# Patient Record
Sex: Male | Born: 1980 | Race: White | Hispanic: No | Marital: Married | Smoking: Current every day smoker
Health system: Southern US, Community
[De-identification: ages and names within clinical notes are randomized; demographics above are authoritative.]

## PROBLEM LIST (undated history)

## (undated) DIAGNOSIS — I10 Essential (primary) hypertension: Secondary | ICD-10-CM

## (undated) DIAGNOSIS — F32A Depression, unspecified: Secondary | ICD-10-CM

## (undated) DIAGNOSIS — R188 Other ascites: Secondary | ICD-10-CM

## (undated) DIAGNOSIS — M549 Dorsalgia, unspecified: Principal | ICD-10-CM

## (undated) DIAGNOSIS — K7290 Hepatic failure, unspecified without coma: Secondary | ICD-10-CM

## (undated) DIAGNOSIS — F1010 Alcohol abuse, uncomplicated: Secondary | ICD-10-CM

## (undated) DIAGNOSIS — K7030 Alcoholic cirrhosis of liver without ascites: Secondary | ICD-10-CM

## (undated) DIAGNOSIS — F419 Anxiety disorder, unspecified: Secondary | ICD-10-CM

## (undated) DIAGNOSIS — J449 Chronic obstructive pulmonary disease, unspecified: Secondary | ICD-10-CM

## (undated) DIAGNOSIS — G8929 Other chronic pain: Secondary | ICD-10-CM

## (undated) DIAGNOSIS — K7682 Hepatic encephalopathy: Secondary | ICD-10-CM

## (undated) DIAGNOSIS — I8500 Esophageal varices without bleeding: Secondary | ICD-10-CM

## (undated) DIAGNOSIS — N529 Male erectile dysfunction, unspecified: Secondary | ICD-10-CM

## (undated) HISTORY — PX: GANGLION CYST EXCISION: SHX1691

## (undated) HISTORY — DX: Other ascites: R18.8

## (undated) HISTORY — DX: Alcoholic cirrhosis of liver without ascites: K70.30

## (undated) HISTORY — DX: Alcohol abuse, uncomplicated: F10.10

## (undated) HISTORY — DX: Esophageal varices without bleeding: I85.00

## (undated) HISTORY — DX: Other chronic pain: G89.29

## (undated) HISTORY — PX: HAND SURGERY: SHX662

## (undated) HISTORY — DX: Anxiety disorder, unspecified: F41.9

## (undated) HISTORY — PX: WRIST SURGERY: SHX841

## (undated) HISTORY — DX: Dorsalgia, unspecified: M54.9

## (undated) HISTORY — DX: Depression, unspecified: F32.A

## (undated) HISTORY — DX: Hepatic encephalopathy: K76.82

## (undated) HISTORY — DX: Male erectile dysfunction, unspecified: N52.9

## (undated) HISTORY — DX: Essential (primary) hypertension: I10

## (undated) HISTORY — DX: Chronic obstructive pulmonary disease, unspecified: J44.9

## (undated) HISTORY — DX: Hepatic failure, unspecified without coma: K72.90

## (undated) NOTE — *Deleted (*Deleted)
[facility] - Clinic Note  11/30/2019     CHIEF COMPLAINT Patient presents for No chief complaint on file.   HISTORY OF PRESENT ILLNESS: Zachary Mata is a 75 y.o. male who presents to the clinic today for:     Referring physician: Monica Becton, MD [phone] [place] [address] [place],  [place] [address]  HISTORICAL INFORMATION:   Selected notes from the MEDICAL RECORD NUMBER    CURRENT MEDICATIONS: Current Outpatient Medications (Ophthalmic Drugs)  Medication Sig  . azelastine (OPTIVAR) 0.05 % ophthalmic solution Place 2 drops into both eyes 2 (two) times daily. (Patient not taking: Reported on 11/19/2019)   No current facility-administered medications for this visit. (Ophthalmic Drugs)   Current Outpatient Medications (Other)  Medication Sig  . busPIRone (BUSPAR) 15 MG tablet 1 tablet p.o. twice daily to 3 times daily (Patient not taking: Reported on 11/19/2019)  . cetirizine (ZYRTEC) 10 MG chewable tablet Chew 10 mg by mouth daily.  . fluticasone (FLONASE) 50 MCG/ACT nasal spray One spray in each nostril twice a day (Patient not taking: Reported on 11/19/2019)  . lisinopril (ZESTRIL) 20 MG tablet Take 1 tablet (20 mg total) by mouth daily. (Patient not taking: Reported on 11/19/2019)  . tadalafil (CIALIS) 5 MG tablet Take 1 tablet (5 mg total) by mouth daily as needed for erectile dysfunction. (Patient not taking: Reported on 11/19/2019)  . umeclidinium-vilanterol (ANORO ELLIPTA) 62.5-25 MCG/INH AEPB Inhale 1 puff into the lungs daily. (Patient not taking: Reported on 11/19/2019)  . Vitamin D, Ergocalciferol, (DRISDOL) 1.25 MG (50000 UNIT) CAPS capsule Take 1 capsule (50,000 Units total) by mouth every 7 (seven) days. Take for 8 total doses(weeks) (Patient not taking: Reported on 11/19/2019)   No current facility-administered medications for this visit. (Other)      REVIEW OF SYSTEMS:    ALLERGIES Allergies  Allergen Reactions  . Sulfa  Antibiotics Swelling    PAST MEDICAL HISTORY Past Medical History:  Diagnosis Date  . Chronic back pain 03/02/2012  . Essential hypertension 03/02/2012  . Hypertension    Past Surgical History:  Procedure Laterality Date  . HAND SURGERY    . WRIST SURGERY      FAMILY HISTORY Family History  Problem Relation Age of Onset  . Cancer Mother        breats CA  . Diabetes Mother   . Breast cancer Other     SOCIAL HISTORY Social History   Tobacco Use  . Smoking status: Current Every Day Smoker    Packs/day: 2.00    Years: 15.00    Pack years: 30.00    Types: Cigarettes  . Smokeless tobacco: Never Used  Vaping Use  . Vaping Use: Never used  Substance Use Topics  . Alcohol use: Yes    Alcohol/week: 6.0 - 7.0 standard drinks    Types: 6 - 7 Cans of beer per week  . Drug use: No         OPHTHALMIC EXAM:  Not recorded     IMAGING AND PROCEDURES  Imaging and Procedures for 11/30/2019           ASSESSMENT/PLAN:  No diagnosis found.  1-3. 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 No retinal edema on exam or OCT  - OCT w/ mild parafoveal ellipsoid thinning OU (OS > OD)  5,6. Hypertensive retinopathy OU - discussed importance of tight BP control - monitor  7. Mild mixed cataracts OU  - The symptoms of cataract, surgical options, and treatments and risks were discussed with patient.  - discussed diagnosis and progression  - not yet visually significant  - monitor for now  8. Hx of trauma w/ orbital fractures OS   Ophthalmic Meds Ordered this visit:  No orders of the defined types were placed in  this encounter.      No follow-ups on file.  There are no Patient Instructions on file for this visit.   Explained the diagnoses, plan, and follow up with the patient and they expressed understanding.  Patient expressed understanding of the importance of proper follow up care.   This document serves as a record of services personally performed by Karie Chimera, MD, PhD. It was created on their behalf by Cristopher Estimable, COT an ophthalmic technician. The creation of this record is the provider's dictation and/or activities during the visit.    Electronically signed by: Cristopher Estimable, COT 10.20.21 @ 3:28 PM  Abbreviations: M myopia (nearsighted); A astigmatism; H hyperopia (farsighted); P presbyopia; Mrx spectacle prescription;  CTL contact lenses; OD right eye; OS left eye; OU both eyes  XT exotropia; ET esotropia; PEK punctate epithelial keratitis; PEE punctate epithelial erosions; DES dry eye syndrome; MGD meibomian gland dysfunction; ATs artificial tears; PFAT's preservative free artificial tears; NSC nuclear sclerotic cataract; PSC posterior subcapsular cataract; ERM epi-retinal membrane; PVD posterior vitreous detachment; RD retinal detachment; DM diabetes mellitus; DR diabetic retinopathy; NPDR non-proliferative diabetic retinopathy; PDR proliferative diabetic retinopathy; CSME clinically significant macular edema; DME diabetic macular edema; dbh dot blot hemorrhages; CWS cotton wool spot; POAG primary open angle glaucoma; C/D cup-to-disc ratio; HVF humphrey visual field; GVF goldmann visual field; OCT optical coherence tomography; IOP intraocular pressure; BRVO Branch retinal vein occlusion; CRVO central retinal vein occlusion; CRAO central retinal artery occlusion; BRAO branch retinal artery occlusion; RT retinal tear; SB scleral buckle; PPV pars plana vitrectomy; VH Vitreous hemorrhage; PRP panretinal laser photocoagulation; IVK intravitreal kenalog; VMT vitreomacular traction; MH Macular  hole;  NVD neovascularization of the disc; NVE neovascularization elsewhere; AREDS age related eye disease study; ARMD age related macular degeneration; POAG primary open angle glaucoma; EBMD epithelial/anterior basement membrane dystrophy; ACIOL anterior chamber intraocular lens; IOL intraocular lens; PCIOL posterior chamber intraocular lens; Phaco/IOL phacoemulsification with intraocular lens placement; PRK photorefractive keratectomy; LASIK laser assisted in situ keratomileusis; HTN hypertension; DM diabetes mellitus; COPD chronic obstructive pulmonary disease

## (undated) NOTE — *Deleted (*Deleted)
[facility] - Clinic Note  11/19/2019     CHIEF COMPLAINT Patient presents for Retina Evaluation   HISTORY OF PRESENT ILLNESS: Zachary Mata is a 78 y.o. male who presents to the clinic today for:   HPI    Retina Evaluation    In both eyes.  This started months ago (3-6).  Duration of months (3-6).  Associated Symptoms Floaters.  Negative for Flashes, Pain, Trauma, Fever, Redness, Scalp Tenderness, Weight Loss, Distortion, Photophobia, Jaw Claudication, Fatigue, Blind Spot, Glare and Shoulder/Hip pain.  Context:  night driving, dim lighting, distance vision, near vision and reading.  Treatments tried include no treatments.  I, the attending physician,  performed the HPI with the patient and updated documentation appropriately.          Comments    Patient c/o decrease in night time vision. Vision is bad from sundown to sunup. Patient works at [employer]. Has trouble at work seeing screw heads, especially in dim lighting. History of high liver enzymes several years ago. No other health problems, other than possible high blood pressure, but patient not on meds. No diabetes. Symptoms for the past 3-6 months.        Last edited by Rennis Chris, MD on 11/19/2019  3:17 PM. (History)      Referring physician: Monica Becton, MD [phone] [place] [address] [place],  [place] [address]  HISTORICAL INFORMATION:   Selected notes from the MEDICAL RECORD NUMBER Referred by Dr. Nedra Hai:  Ocular Hx- PMH-    CURRENT MEDICATIONS: Current Outpatient Medications (Ophthalmic Drugs)  Medication Sig  . azelastine (OPTIVAR) 0.05 % ophthalmic solution Place 2 drops into both eyes 2 (two) times daily. (Patient not taking: Reported on 11/19/2019)   No current facility-administered medications for this visit. (Ophthalmic Drugs)   Current Outpatient Medications (Other)  Medication Sig  . cetirizine (ZYRTEC) 10 MG chewable tablet Chew 10 mg by mouth daily.  . busPIRone  (BUSPAR) 15 MG tablet 1 tablet p.o. twice daily to 3 times daily (Patient not taking: Reported on 11/19/2019)  . fluticasone (FLONASE) 50 MCG/ACT nasal spray One spray in each nostril twice a day (Patient not taking: Reported on 11/19/2019)  . lisinopril (ZESTRIL) 20 MG tablet Take 1 tablet (20 mg total) by mouth daily. (Patient not taking: Reported on 11/19/2019)  . tadalafil (CIALIS) 5 MG tablet Take 1 tablet (5 mg total) by mouth daily as needed for erectile dysfunction. (Patient not taking: Reported on 11/19/2019)  . umeclidinium-vilanterol (ANORO ELLIPTA) 62.5-25 MCG/INH AEPB Inhale 1 puff into the lungs daily. (Patient not taking: Reported on 11/19/2019)  . Vitamin D, Ergocalciferol, (DRISDOL) 1.25 MG (50000 UNIT) CAPS capsule Take 1 capsule (50,000 Units total) by mouth every 7 (seven) days. Take for 8 total doses(weeks) (Patient not taking: Reported on 11/19/2019)   No current facility-administered medications for this visit. (Other)      REVIEW OF SYSTEMS: ROS    Positive for: Eyes   Negative for: Constitutional, Gastrointestinal, Neurological, Skin, Genitourinary, Musculoskeletal, HENT, Endocrine, Cardiovascular, Respiratory, Psychiatric, Allergic/Imm, Heme/Lymph   Last edited by Annalee Genta D, COT on 11/19/2019  2:47 PM. (History)       ALLERGIES Allergies  Allergen Reactions  . Sulfa Antibiotics Swelling    PAST MEDICAL HISTORY Past Medical History:  Diagnosis Date  . Chronic back pain 03/02/2012  . Essential hypertension 03/02/2012  . Hypertension    Past Surgical History:  Procedure Laterality Date  . HAND SURGERY    .  WRIST SURGERY      FAMILY HISTORY Family History  Problem Relation Age of Onset  . Cancer Mother        breats CA  . Diabetes Mother   . Breast cancer Other     SOCIAL HISTORY Social History   Tobacco Use  . Smoking status: Current Every Day Smoker    Packs/day: 2.00    Years: 15.00    Pack years: 30.00    Types: Cigarettes  .  Smokeless tobacco: Never Used  Vaping Use  . Vaping Use: Never used  Substance Use Topics  . Alcohol use: Yes    Alcohol/week: 6.0 - 7.0 standard drinks    Types: 6 - 7 Cans of beer per week  . Drug use: No         OPHTHALMIC EXAM:  Base Eye Exam    Visual Acuity (Snellen - Linear)      Right Left   Dist [place] 20/20 -1 20/25 -2   Dist ph [place] NI 20/20 -1       Tonometry (Tonopen, 3:06 PM)      Right Left   Pressure 15 16       Pupils      Dark Light Shape React APD   Right 4 3 Round Brisk None   Left 4 4 Round Minimal +2       Visual Fields (Counting fingers)      Left Right    Full Full       Extraocular Movement      Right Left    Full, Ortho Full, Ortho       Neuro/Psych    Oriented x3: Yes   Mood/Affect: Normal       Dilation    Both eyes: 1.0% Mydriacyl, 2.5% Phenylephrine @ 3:06 PM        Refraction    Manifest Refraction      Sphere Cylinder Axis Dist VA   Right -0.75 +0.25 165 20/20-2   Left -1.00 +0.75 140 20/25  Color plates OD: 16/10 correct, OS: 14/25 correct          IMAGING AND PROCEDURES  Imaging and Procedures for 11/19/2019  OCT, Retina - OU - Both Eyes       Right Eye Quality was good. Central Foveal Thickness: 208. Progression has no prior data. Findings include normal foveal contour, no IRF, no SRF (Trace ERM, ? Diffuse retinal thinning, mild decreae in ellipsoid signal nasal perifoveal region).   Left Eye Central Foveal Thickness: 212. Progression has no prior data. Findings include normal foveal contour, no IRF, no SRF (Mild decrease in perifoveal ellipsoid signal).   Notes *Images captured and stored on drive  Diagnosis / Impression:  NFP, no IRF/SRF OU OD: Trace ERM, ? Diffuse retinal thinning, mild decreae in ellipsoid signal nasal perifoveal region OS: Mild decrease in perifoveal ellipsoid signal Clinical management:  See below  Abbreviations: NFP - Normal foveal profile. CME - cystoid macular edema. PED -  pigment epithelial detachment. IRF - intraretinal fluid. SRF - subretinal fluid. EZ - ellipsoid zone. ERM - epiretinal membrane. ORA - outer retinal atrophy. ORT - outer retinal tubulation. SRHM - subretinal hyper-reflective material. IRHM - intraretinal hyper-reflective material                 ASSESSMENT/PLAN:    ICD-10-CM   1. Retinal edema  H35.81 OCT, Retina - OU - Both Eyes    1.  2.  3.  Ophthalmic Meds  Ordered this visit:  No orders of the defined types were placed in this encounter.      No follow-ups on file.  There are no Patient Instructions on file for this visit.   Explained the diagnoses, plan, and follow up with the patient and they expressed understanding.  Patient expressed understanding of the importance of proper follow up care.   Karie Chimera, M.D., Ph.D. Diseases & Surgery of the Retina and Vitreous [facility] @TODAY @     Abbreviations: M myopia (nearsighted); A astigmatism; H hyperopia (farsighted); P presbyopia; Mrx spectacle prescription;  CTL contact lenses; OD right eye; OS left eye; OU both eyes  XT exotropia; ET esotropia; PEK punctate epithelial keratitis; PEE punctate epithelial erosions; DES dry eye syndrome; MGD meibomian gland dysfunction; ATs artificial tears; PFAT's preservative free artificial tears; NSC nuclear sclerotic cataract; PSC posterior subcapsular cataract; ERM epi-retinal membrane; PVD posterior vitreous detachment; RD retinal detachment; DM diabetes mellitus; DR diabetic retinopathy; NPDR non-proliferative diabetic retinopathy; PDR proliferative diabetic retinopathy; CSME clinically significant macular edema; DME diabetic macular edema; dbh dot blot hemorrhages; CWS cotton wool spot; POAG primary open angle glaucoma; C/D cup-to-disc ratio; HVF humphrey visual field; GVF goldmann visual field; OCT optical coherence tomography; IOP intraocular pressure; BRVO Branch retinal vein occlusion; CRVO  central retinal vein occlusion; CRAO central retinal artery occlusion; BRAO branch retinal artery occlusion; RT retinal tear; SB scleral buckle; PPV pars plana vitrectomy; VH Vitreous hemorrhage; PRP panretinal laser photocoagulation; IVK intravitreal kenalog; VMT vitreomacular traction; MH Macular hole;  NVD neovascularization of the disc; NVE neovascularization elsewhere; AREDS age related eye disease study; ARMD age related macular degeneration; POAG primary open angle glaucoma; EBMD epithelial/anterior basement membrane dystrophy; ACIOL anterior chamber intraocular lens; IOL intraocular lens; PCIOL posterior chamber intraocular lens; Phaco/IOL phacoemulsification with intraocular lens placement; PRK photorefractive keratectomy; LASIK laser assisted in situ keratomileusis; HTN hypertension; DM diabetes mellitus; COPD chronic obstructive pulmonary disease

## (undated) NOTE — *Deleted (*Deleted)
[facility] - Clinic Note  12/14/2019     CHIEF COMPLAINT Patient presents for No chief complaint on file.   HISTORY OF PRESENT ILLNESS: Zachary Mata is a 11 y.o. male who presents to the clinic today for:     Referring physician: Monica Becton, MD [phone] [place] [address] [place],  [place] [address]  HISTORICAL INFORMATION:   Selected notes from the MEDICAL RECORD NUMBER    CURRENT MEDICATIONS: No current outpatient medications on file. (Ophthalmic Drugs)   No current facility-administered medications for this visit. (Ophthalmic Drugs)   Current Outpatient Medications (Other)  Medication Sig  . furosemide (LASIX) 40 MG tablet Take 1 tablet (40 mg total) by mouth daily.  Marland Kitchen gabapentin (NEURONTIN) 100 MG capsule Take 1 capsule (100 mg total) by mouth 2 (two) times daily.  Marland Kitchen lactulose (CHRONULAC) 10 GM/15ML solution Take 45 mLs (30 g total) by mouth daily.  . pantoprazole (PROTONIX) 40 MG tablet Take 1 tablet (40 mg total) by mouth daily.  . prednisoLONE (PRELONE) 15 MG/5ML SOLN 13 ml daily (approximately 40 mg)  . rifaximin (XIFAXAN) 550 MG TABS tablet Take 1 tablet (550 mg total) by mouth 2 (two) times daily.  Marland Kitchen spironolactone (ALDACTONE) 100 MG tablet Take 1 tablet (100 mg total) by mouth daily.  . traMADol (ULTRAM) 50 MG tablet Take 1 tablet (50 mg total) by mouth every 12 (twelve) hours as needed for up to 5 doses for severe pain.   No current facility-administered medications for this visit. (Other)      REVIEW OF SYSTEMS:    ALLERGIES Allergies  Allergen Reactions  . Sulfa Antibiotics Swelling    PAST MEDICAL HISTORY Past Medical History:  Diagnosis Date  . Chronic back pain 03/02/2012  . Essential hypertension 03/02/2012  . Hypertension    Past Surgical History:  Procedure Laterality Date  . HAND SURGERY    . IR PARACENTESIS  11/25/2019  . WRIST SURGERY      FAMILY HISTORY Family History  Problem Relation Age of  Onset  . Cancer Mother        breats CA  . Diabetes Mother   . Breast cancer Other     SOCIAL HISTORY Social History   Tobacco Use  . Smoking status: Current Every Day Smoker    Packs/day: 2.00    Years: 15.00    Pack years: 30.00    Types: Cigarettes  . Smokeless tobacco: Never Used  Vaping Use  . Vaping Use: Never used  Substance Use Topics  . Alcohol use: Yes    Alcohol/week: 6.0 - 7.0 standard drinks    Types: 6 - 7 Cans of beer per week  . Drug use: No         OPHTHALMIC EXAM:  Not recorded     IMAGING AND PROCEDURES  Imaging and Procedures for 12/14/2019           ASSESSMENT/PLAN:  No diagnosis found.  1-3. 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 No retinal edema on exam or  OCT  - OCT w/ mild parafoveal ellipsoid thinning OU (OS > OD)  5,6. Hypertensive retinopathy OU - discussed importance of tight BP control - monitor  7. Mild mixed cataracts OU  - The symptoms of cataract, surgical options, and treatments and risks were discussed with patient.  - discussed diagnosis and progression  - not yet visually significant  - monitor for now  8. Hx of trauma w/ orbital fractures OS   Ophthalmic Meds Ordered this visit:  No orders of the defined types were placed in this encounter.      No follow-ups on file.  There are no Patient Instructions on file for this visit.   Explained the diagnoses, plan, and follow up with the patient and they expressed understanding.  Patient expressed understanding of the importance of proper follow up care.   This document serves as a record of  services personally performed by Karie Chimera, MD, PhD. It was created on their behalf by Cristopher Estimable, COT an ophthalmic technician. The creation of this record is the provider's dictation and/or activities during the visit.    Electronically signed by: Cristopher Estimable, COT 11.4.21 @ 7:57 AM  Abbreviations: M myopia (nearsighted); A astigmatism; H hyperopia (farsighted); P presbyopia; Mrx spectacle prescription;  CTL contact lenses; OD right eye; OS left eye; OU both eyes  XT exotropia; ET esotropia; PEK punctate epithelial keratitis; PEE punctate epithelial erosions; DES dry eye syndrome; MGD meibomian gland dysfunction; ATs artificial tears; PFAT's preservative free artificial tears; NSC nuclear sclerotic cataract; PSC posterior subcapsular cataract; ERM epi-retinal membrane; PVD posterior vitreous detachment; RD retinal detachment; DM diabetes mellitus; DR diabetic retinopathy; NPDR non-proliferative diabetic retinopathy; PDR proliferative diabetic retinopathy; CSME clinically significant macular edema; DME diabetic macular edema; dbh dot blot hemorrhages; CWS cotton wool spot; POAG primary open angle glaucoma; C/D cup-to-disc ratio; HVF humphrey visual field; GVF goldmann visual field; OCT optical coherence tomography; IOP intraocular pressure; BRVO Branch retinal vein occlusion; CRVO central retinal vein occlusion; CRAO central retinal artery occlusion; BRAO branch retinal artery occlusion; RT retinal tear; SB scleral buckle; PPV pars plana vitrectomy; VH Vitreous hemorrhage; PRP panretinal laser photocoagulation; IVK intravitreal kenalog; VMT vitreomacular traction; MH Macular hole;  NVD neovascularization of the disc; NVE neovascularization elsewhere; AREDS age related eye disease study; ARMD age related macular degeneration; POAG primary open angle glaucoma; EBMD epithelial/anterior basement membrane dystrophy; ACIOL anterior chamber intraocular lens; IOL intraocular lens; PCIOL posterior chamber  intraocular lens; Phaco/IOL phacoemulsification with intraocular lens placement; PRK photorefractive keratectomy; LASIK laser assisted in situ keratomileusis; HTN hypertension; DM diabetes mellitus; COPD chronic obstructive pulmonary disease

---
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
## 2001-08-08 ENCOUNTER — Encounter: Payer: Self-pay | Admitting: Emergency Medicine

## 2001-08-08 ENCOUNTER — Emergency Department (HOSPITAL_COMMUNITY): Admission: EM | Admit: 2001-08-08 | Discharge: 2001-08-08 | Payer: Self-pay | Admitting: Emergency Medicine

## 2005-03-20 ENCOUNTER — Emergency Department (HOSPITAL_COMMUNITY): Admission: EM | Admit: 2005-03-20 | Discharge: 2005-03-20 | Payer: Self-pay | Admitting: Emergency Medicine

## 2005-03-20 IMAGING — CT CT HEAD W/O CM
1 series · 16 of 30 positions shown, 20 images · IV contrast (agent unspecified)
Comparison: none

CLINICAL DATA: Anxiety attack, altered level of consciousness.  Complains of a headache and chest pain.
 HEAD CT WITHOUT CONTRAST:
TECHNIQUE: Contiguous axial images were obtained from the base of the skull through the vertex according to standard protocol without contrast.

[Series 2: headseq 4.8 h45s · axial · 0.43mm/px · z∈[-132,+22]mm · 16 of 36 slices shown, 20 images]
[im 2/36  brain]
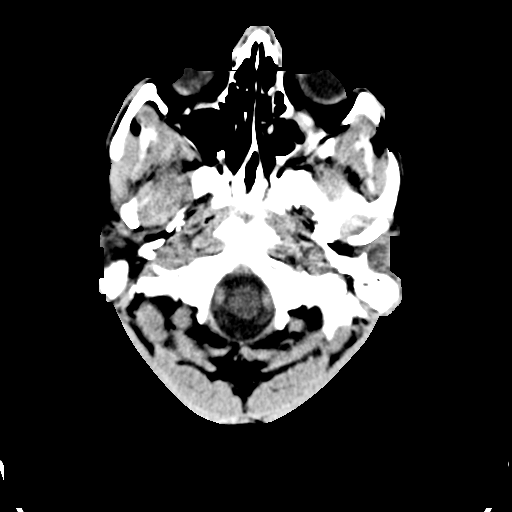
[im 2/36  bone]
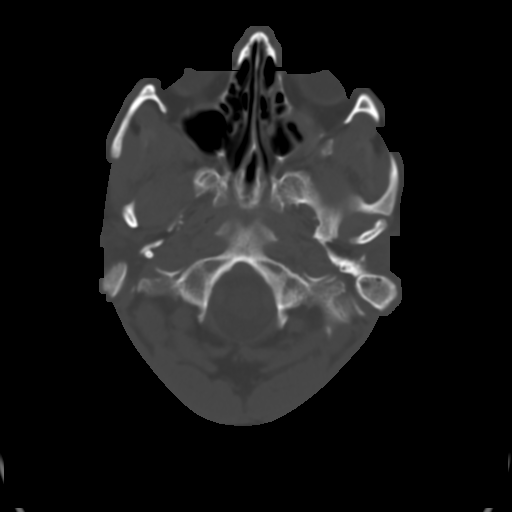
[im 4/36  brain]
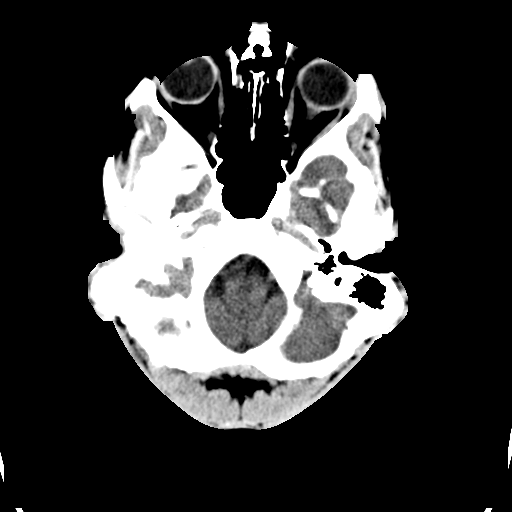
[im 7/36  brain]
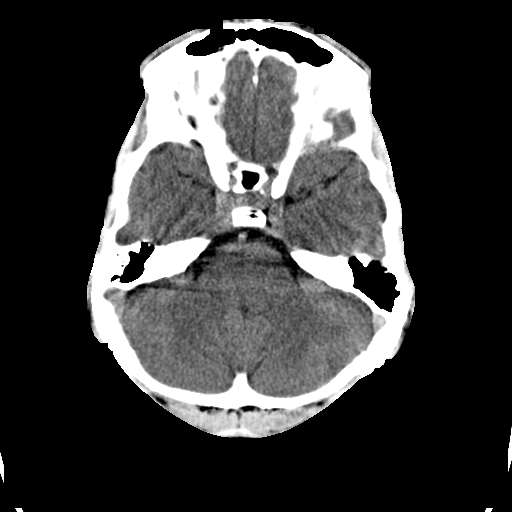
[im 9/36  brain]
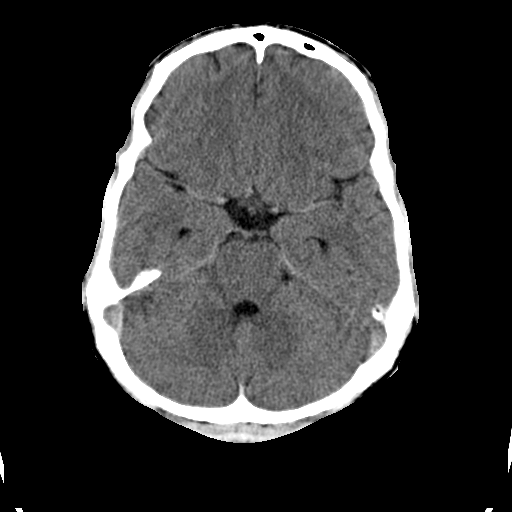
[im 10/36  brain]
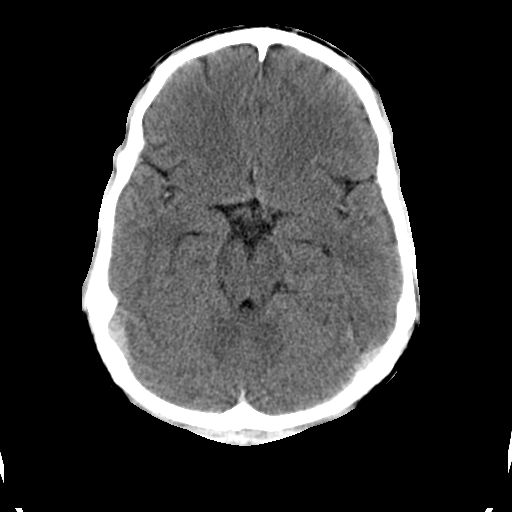
[im 10/36  bone]
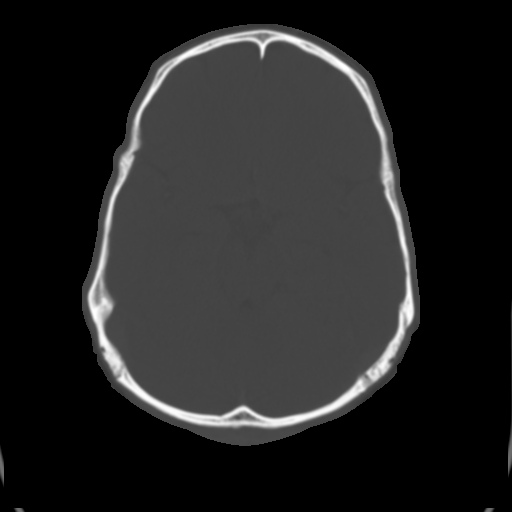
[im 13/36  brain]
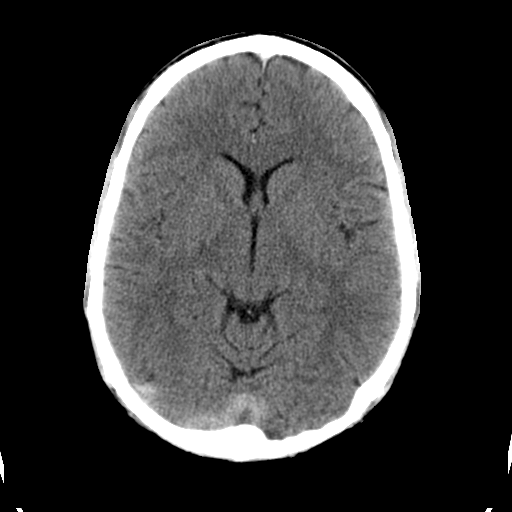
[im 15/36  brain]
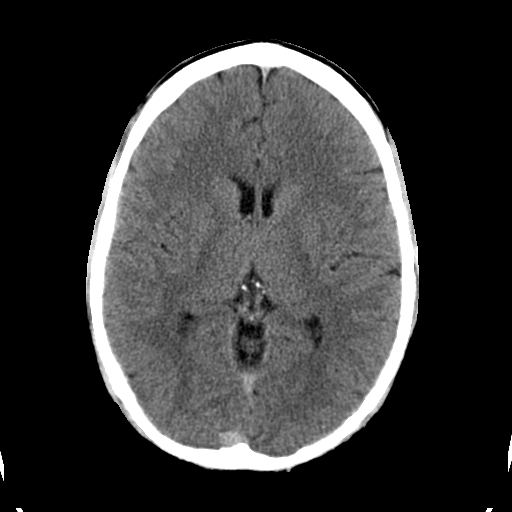
[im 17/36  brain]
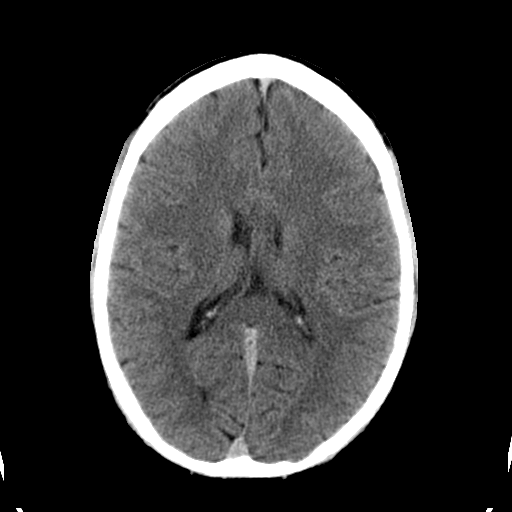
[im 19/36  brain]
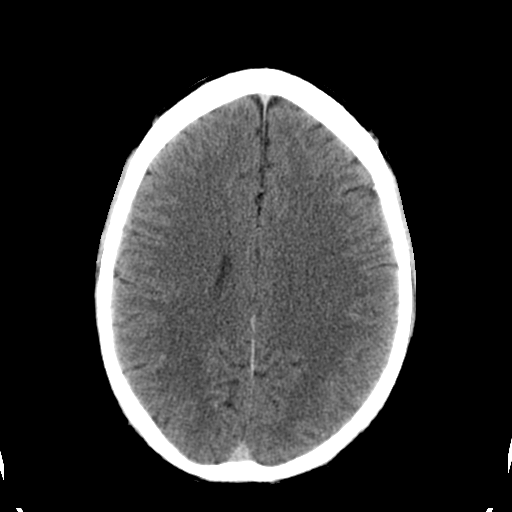
[im 19/36  bone]
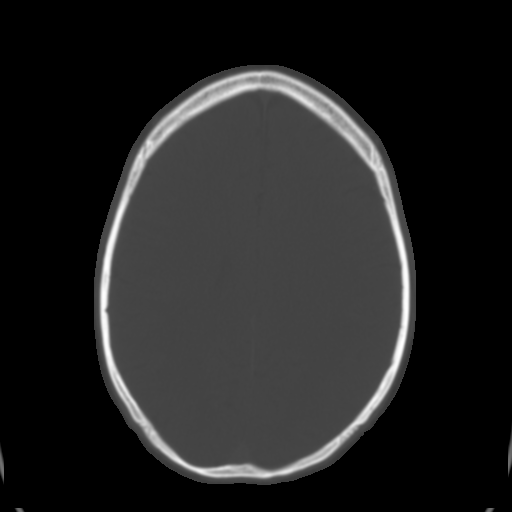
[im 21/36  brain]
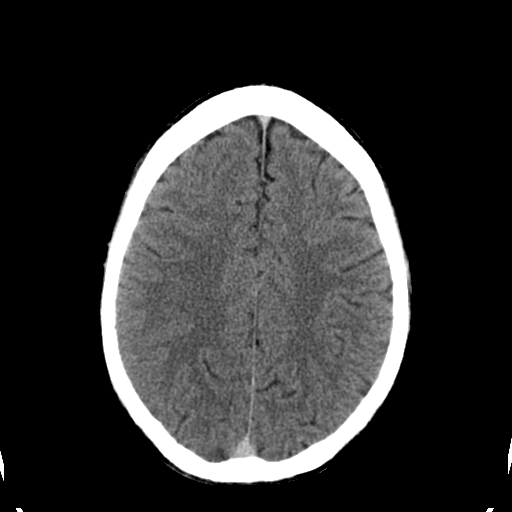
[im 23/36  brain]
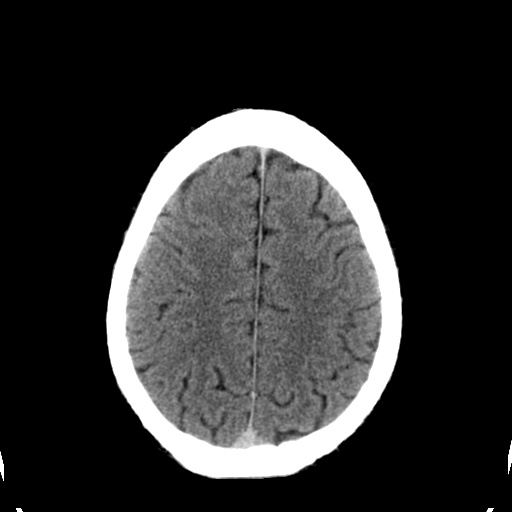
[im 26/36  brain]
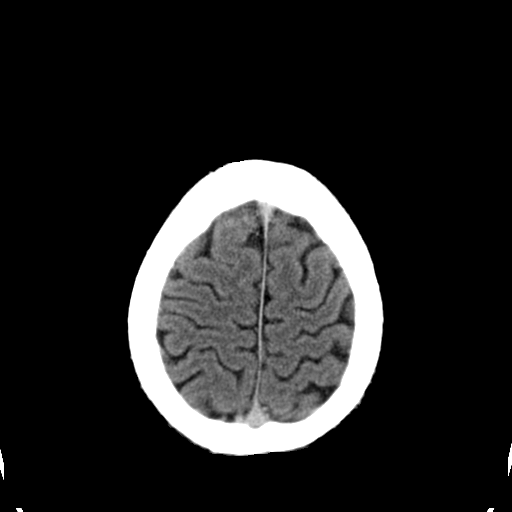
[im 27/36  brain]
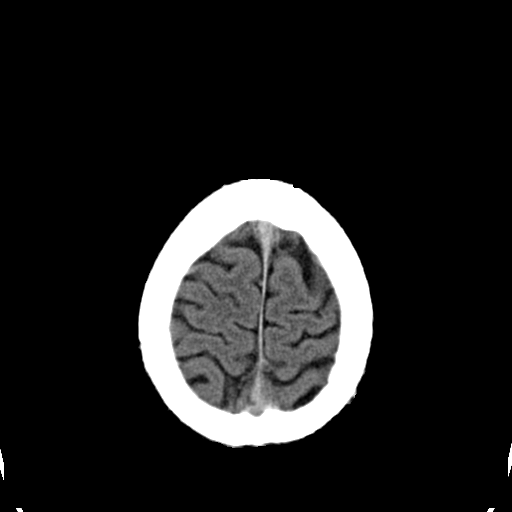
[im 27/36  bone]
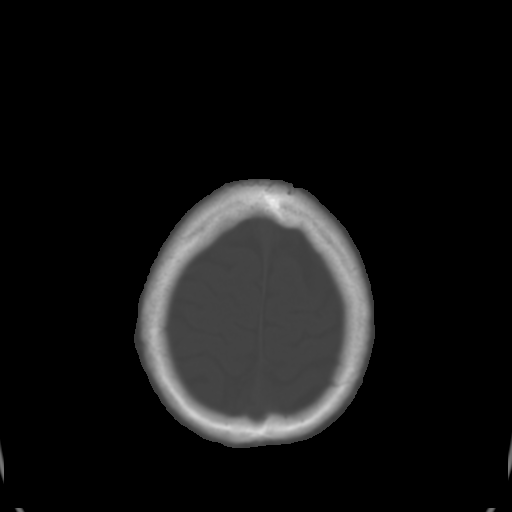
[im 29/36  brain]
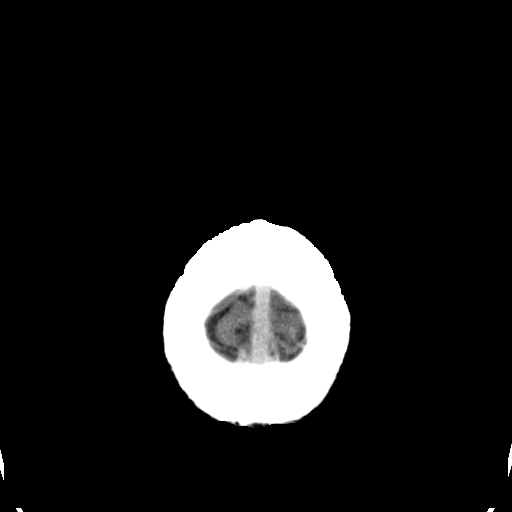
[im 32/36  brain]
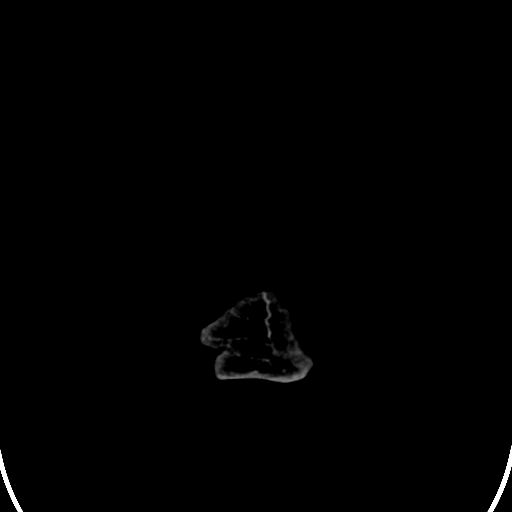
[im 34/36  brain]
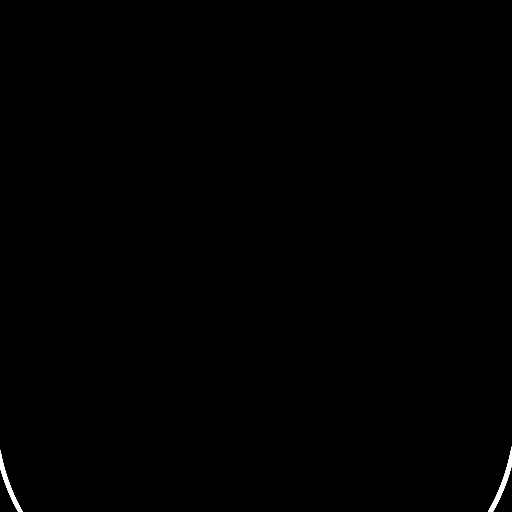

[16 of 30 positions shown; findings below may reference images not displayed]

FINDINGS: The brain parenchyma is normal in attenuation and morphology.  The ventricular volumes are within normal limits.  There is no abnormal extra-axial fluid collections, intracranial hemorrhage or masses.  
 There is chronic appearing mucosal thickening affecting the left maxillary sinus.  The remaining paranasal sinuses are normally aerated.  Mastoid air cells are normally aerated.
IMPRESSION: 1.  No acute intracranial abnormality.  
 2.  Left maxillary sinus disease.

## 2005-05-20 ENCOUNTER — Emergency Department (HOSPITAL_COMMUNITY): Admission: EM | Admit: 2005-05-20 | Discharge: 2005-05-20 | Payer: Self-pay | Admitting: Emergency Medicine
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
## 2005-06-02 ENCOUNTER — Emergency Department (HOSPITAL_COMMUNITY): Admission: EM | Admit: 2005-06-02 | Discharge: 2005-06-02 | Payer: Self-pay | Admitting: Emergency Medicine

## 2005-06-11 ENCOUNTER — Ambulatory Visit: Payer: Self-pay | Admitting: Internal Medicine

## 2005-06-14 ENCOUNTER — Ambulatory Visit: Payer: Self-pay | Admitting: Internal Medicine

## 2008-05-25 ENCOUNTER — Emergency Department (HOSPITAL_BASED_OUTPATIENT_CLINIC_OR_DEPARTMENT_OTHER): Admission: EM | Admit: 2008-05-25 | Discharge: 2008-05-25 | Payer: Self-pay | Admitting: Emergency Medicine

## 2010-06-22 NOTE — Op Note (Signed)
[place]. [facility]  Patient:    DARAN, FAVARO                        MRN: [id number] Proc. Date: 01/24/00 Adm. Date:  [id number] Attending:  Colbert Ewing                           Operative Report  PREOPERATIVE DIAGNOSIS:  Symptomatic dorsal ganglion, left wrist.  POSTOPERATIVE DIAGNOSIS:  Symptomatic dorsal ganglion, left wrist.  PROCEDURE:  Excision, symptomatic ganglion, dorsal aspect left wrist.  SURGEON:  Loreta Ave, M.D.  ASSISTANT:  Arlys John D. Petrarca, P.A.-C.  ANESTHESIA:  IV regional with sedation.  SPECIMENS EXCISED:  Ganglion.  CULTURES:  None.  COMPLICATIONS:  None.  DRESSINGS:  Soft compressive with a bulky hand dressing and splint.  DESCRIPTION OF PROCEDURE:  Patient brought to the operating room and placed on the operating table in the supine position.  After adequate anesthesia had been obtained, the left arm was prepped and draped in the usual sterile fashion.  The ganglion, which was more than 1.5 cm in diameter, was located at the typical position, dorsal aspect scaphoradiolunate interval.  Approached with a transverse incision, avoiding and protecting the superficial branch, radial nerve.  Ganglion was excised in its entirety, tracking it all the way down to the wrist capsule.  Relatively sessile exit from the capsule, which was completely excised, leaving a small window in the dorsal capsule and removing the ganglion in its entirety and being sure there were no satellite lesions around the margin.  Within the wrist, the bony structures and cartilaginous structures were normal.  Some localized synovitis near the entrance of the ganglion was excised.  The wound was copiously irrigated.  A window was left in the dorsal wrist capsule.  A deep Vicryl was placed over the retinaculum, which had been opened in a transverse manner, just to reapproximate this and close dead space.  Skin was then closed with interrupted  nylon.  Margins of the wound injected with Marcaine.  Sterile compressive dressing with a bulky hand dressing and splint applied. Tourniquet deflated, anesthesia reversed, brought to recovery room.  Tolerated the surgery well, no complications. DD:  01/24/00 TD:  01/25/00 Job: [id number]

## 2010-06-22 NOTE — Op Note (Signed)
[place]. [facility]  Patient:    SOU, NOHR                        MRN: [id number] Proc. Date: 06/20/00 Adm. Date:  [id number] Disc. Date: [id number] Attending:  Colbert Ewing                           Operative Report  PREOPERATIVE DIAGNOSIS:  Displaced left fifth metacarpal fracture midshaft.  POSTOPERATIVE DIAGNOSIS:  Displaced left fifth metacarpal fracture midshaft.  OPERATION PERFORMED:  Open reduction internal fixation, left fifth metacarpal fracture with a 1.6 mm hand innovations intramedullary rod.  SURGEON:  Loreta Ave, M.D.  ASSISTANT:  Arlys John D. Petrarca, P.A.-C.  ANESTHESIA:  General.  ESTIMATED BLOOD LOSS:  Minimal.  TOURNIQUET TIME:  20 minutes.  SPECIMENS:  None.  CULTURES:  None.  COMPLICATIONS:  None.  DRESSING:  Soft compressive with ulnar gutter splint.  DESCRIPTION OF PROCEDURE:  The patient was brought to the operating room and placed on the operating table in supine position.  After adequate anesthesia had been obtained, tourniquet applied, left arm prepped and draped in the usual sterile fashion.  Exsanguinated with elevation and Esmarch.  Tourniquet inflated to 250 mmHg.  Fluoroscopic guidance.  Longitudinal incision dorsal based, fifth metacarpal.  Fracture reduced into anatomic position by closed means.  The base of the metacarpal was entered dorsally with the hand innovations awl.  Once entered, the 1.6 mm IM rod was passed up the proximal shaft across the reduced fracture into the distal shaft.  Excellent capturing, alignment and fixation throughout confirmed visually and fluoroscopically. The IM rod was then cut at the proximal end after being bent over to about a 70 degree angle.  The rod was then turned slightly so it would not be prominent at the base.  Overall construct examined fluoroscopically with excellent alignment, fixation and stability.  Wound irrigated, injected with Marcaine and  closed with nylon.  Sterile compressive dressing and ulnar gutter splint applied.  Anesthesia reversed.  Brought to recovery room.  Tolerated surgery well.  No complications. DD:  06/20/00 TD:  06/23/00 Job: [id number]
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
## 2011-04-28 ENCOUNTER — Emergency Department (HOSPITAL_BASED_OUTPATIENT_CLINIC_OR_DEPARTMENT_OTHER)
Admission: EM | Admit: 2011-04-28 | Discharge: 2011-04-29 | Disposition: A | Discharge status: Home / Self Care | Payer: No Typology Code available for payment source | Attending: Emergency Medicine | Admitting: Emergency Medicine

## 2011-04-28 ENCOUNTER — Encounter (HOSPITAL_BASED_OUTPATIENT_CLINIC_OR_DEPARTMENT_OTHER): Payer: Self-pay | Admitting: *Deleted

## 2011-04-28 ENCOUNTER — Other Ambulatory Visit: Payer: Self-pay

## 2011-04-28 DIAGNOSIS — F17200 Nicotine dependence, unspecified, uncomplicated: Secondary | ICD-10-CM | POA: Insufficient documentation

## 2011-04-28 DIAGNOSIS — R10817 Generalized abdominal tenderness: Secondary | ICD-10-CM | POA: Insufficient documentation

## 2011-04-28 DIAGNOSIS — R079 Chest pain, unspecified: Secondary | ICD-10-CM | POA: Insufficient documentation

## 2011-04-28 DIAGNOSIS — R0602 Shortness of breath: Secondary | ICD-10-CM | POA: Insufficient documentation

## 2011-04-28 DIAGNOSIS — F10929 Alcohol use, unspecified with intoxication, unspecified: Secondary | ICD-10-CM

## 2011-04-28 DIAGNOSIS — F411 Generalized anxiety disorder: Secondary | ICD-10-CM | POA: Insufficient documentation

## 2011-04-28 DIAGNOSIS — I10 Essential (primary) hypertension: Secondary | ICD-10-CM | POA: Insufficient documentation

## 2011-04-28 DIAGNOSIS — R209 Unspecified disturbances of skin sensation: Secondary | ICD-10-CM | POA: Insufficient documentation

## 2011-04-28 DIAGNOSIS — F1010 Alcohol abuse, uncomplicated: Secondary | ICD-10-CM | POA: Insufficient documentation

## 2011-04-28 HISTORY — DX: Essential (primary) hypertension: I10

## 2011-04-28 LAB — CBC
HCT: 51 % (ref 39.0–52.0)
Hemoglobin: 18 g/dL — ABNORMAL HIGH (ref 13.0–17.0)
MCH: 34.9 pg — ABNORMAL HIGH (ref 26.0–34.0)
MCHC: 35.3 g/dL (ref 30.0–36.0)
MCV: 93 fL (ref 78.0–100.0)
Platelets: 242 10*3/uL (ref 150–400)
RBC: 5.16 MIL/uL (ref 4.22–5.81)
RDW: 11.5 % (ref 11.5–15.5)
WBC: 10 10*3/uL (ref 4.0–10.5)

## 2011-04-28 LAB — BASIC METABOLIC PANEL
BUN: 6 mg/dL (ref 6–23)
CO2: 24 mEq/L (ref 19–32)
Calcium: 9.6 mg/dL (ref 8.4–10.5)
Chloride: 103 mEq/L (ref 96–112)
Creatinine, Ser: 0.8 mg/dL (ref 0.50–1.35)
GFR calc Af Amer: >90 mL/min (ref >90)
GFR calc non Af Amer: >90 mL/min (ref >90)
Glucose, Bld: 90 mg/dL (ref 70–99)
Potassium: 3.8 mEq/L (ref 3.5–5.1)
Sodium: 143 mEq/L (ref 135–145)

## 2011-04-28 LAB — D-DIMER, QUANTITATIVE (NOT AT ARMC): D-Dimer, Quant: <0.22 ug/mL-FEU (ref 0.00–0.48)

## 2011-04-28 LAB — ETHANOL: Alcohol, Ethyl (B): 295 mg/dL — ABNORMAL HIGH (ref 0–11)

## 2011-04-28 LAB — TROPONIN I: Troponin I: <0.3 ng/mL (ref <0.30)

## 2011-04-28 MED ORDER — LORAZEPAM 2 MG/ML IJ SOLN
1.0000 mg | Freq: Once | INTRAMUSCULAR | Status: DC
Start: 2011-04-28 — End: 2011-04-29

## 2011-04-28 NOTE — ED Provider Notes (Signed)
History   This chart was scribed for Hanley Seamen, MD by Charolett Bumpers . The patient was seen in room MH03/MH03 and the patient's care was started at 11:12pm.    CSN: [id number]  Arrival date & time 04/28/11  2215   First MD Initiated Contact with Patient 04/28/11 2307      Chief Complaint  Patient presents with  . Chest Pain    (Consider location/radiation/quality/duration/timing/severity/associated sxs/prior treatment) HPI Zachary Mata is a 31 y.o. male who has a h/o HTN, presents to the Emergency Department complaining of constant, moderate chest pain that started 2 hours ago. Patient describes the chest pain as a burning along the anterior left axillary line. Patient reports associated hand tingling, SOB, and anxiety. Patient also reports increased salvia in mouth. Patient denies n/v/d. Patient states that his chest pain has lessened since onset. Patient states that chest pain is occasionally sharp and stabbing, but overall has lessened. Patient reports a prior hx of similar symptoms, but states that they have not been this severe.    Past Medical History  Diagnosis Date  . Hypertension     History reviewed. No pertinent past surgical history.  No family history on file.  History  Substance Use Topics  . Smoking status: Current Everyday Smoker  . Smokeless tobacco: Not on file  . Alcohol Use: Yes      Review of Systems   A complete 10 system review of systems was obtained and is otherwise negative except as noted in the HPI and PMH.   Allergies  Sulfa antibiotics  Home Medications  No current outpatient prescriptions on file.  BP 170/104  Pulse 100  Temp(Src) 98.2 F (36.8 C) (Oral)  Resp 18  SpO2 94%  Physical Exam  Nursing note and vitals reviewed. Constitutional: He is oriented to person, place, and time. He appears well-developed and well-nourished. No distress.       Unable to determine if there was alcohol on breath.   HENT:  Head:  Normocephalic and atraumatic.  Right Ear: External ear normal.  Left Ear: External ear normal.  Nose: Nose normal.  Mouth/Throat: Oropharynx is clear and moist.  Eyes: EOM are normal. Pupils are equal, round, and reactive to light.  Neck: Normal range of motion. Neck supple. No tracheal deviation present.  Cardiovascular: Normal rate, regular rhythm and normal heart sounds.  Exam reveals no gallop and no friction rub.   No murmur heard. Pulmonary/Chest: Effort normal and breath sounds normal. No respiratory distress. He has no wheezes. He has no rales. He exhibits tenderness (chest tenderness reproducible with palpatation along left anterior axillary line. ).  Abdominal: Soft. Bowel sounds are normal. He exhibits no distension. There is tenderness (Mild, generalized abdominal tenderness.).  Musculoskeletal: Normal range of motion. He exhibits no edema.  Neurological: He is alert and oriented to person, place, and time. No sensory deficit.  Skin: Skin is warm and dry.  Psychiatric: He has a normal mood and affect. His behavior is normal.    ED Course  Procedures (including critical care time)  DIAGNOSTIC STUDIES: Oxygen Saturation is 99% on room air, normal by my interpretation.    COORDINATION OF CARE:  2315: Discussed planned course of treatment with the patient, who was agreeable at this time.  2330: Medication Orders: Lorazepam injection 1 mg-once.    MDM  EKG Interpretation:  Date & Time: 04/28/2011 10:20 PM  Rate: 112  Rhythm: sinus tachycardia  QRS Axis: normal  Intervals: normal  ST/T Wave abnormalities: normal  Conduction Disutrbances:none  Narrative Interpretation:   Old EKG Reviewed: Rate is faster  Nursing notes and vitals signs, including pulse oximetry, reviewed.  Summary of this visit's results, reviewed by myself:  Labs:  Results for orders placed during the hospital encounter of 04/28/11  ETHANOL      Component Value Range   Alcohol, Ethyl (B) 295 (*)  0 - 11 (mg/dL)  BASIC METABOLIC PANEL      Component Value Range   Sodium 143  135 - 145 (mEq/L)   Potassium 3.8  3.5 - 5.1 (mEq/L)   Chloride 103  96 - 112 (mEq/L)   CO2 24  19 - 32 (mEq/L)   Glucose, Bld 90  70 - 99 (mg/dL)   BUN 6  6 - 23 (mg/dL)   Creatinine, Ser 7.82  0.50 - 1.35 (mg/dL)   Calcium 9.6  8.4 - 95.6 (mg/dL)   GFR calc non Af Amer >90  >90 (mL/min)   GFR calc Af Amer >90  >90 (mL/min)  CBC      Component Value Range   WBC 10.0  4.0 - 10.5 (K/uL)   RBC 5.16  4.22 - 5.81 (MIL/uL)   Hemoglobin 18.0 (*) 13.0 - 17.0 (g/dL)   HCT 21.3  08.6 - 57.8 (%)   MCV 93.0  78.0 - 100.0 (fL)   MCH 34.9 (*) 26.0 - 34.0 (pg)   MCHC 35.3  30.0 - 36.0 (g/dL)   RDW 46.9  62.9 - 52.8 (%)   Platelets 242  150 - 400 (K/uL)  TROPONIN I      Component Value Range   Troponin I <0.30  <0.30 (ng/mL)  D-DIMER, QUANTITATIVE      Component Value Range   D-Dimer, Quant <0.22  0.00 - 0.48 (ug/mL-FEU)     I personally performed the services described in this documentation, which was scribed in my presence.  The recorded information has been reviewed and considered.   12:16 AM Patient demanding to leave.      Hanley Seamen, MD 04/29/11 [phone]

## 2011-04-28 NOTE — ED Notes (Signed)
Pt reports that he is having chest pain that stared about an hour ago and tingling in his hands

## 2011-04-29 NOTE — Discharge Instructions (Signed)
Alcohol Intoxication  You have alcohol intoxication when the amount of alcohol that you have consumed has impaired your ability to mentally and physically function. There are a variety of factors that contribute to the level at which alcohol intoxication can occur, such as age, gender, weight, frequency of alcohol consumption, medication use, and the presence of other medical conditions, such as diabetes, seizures, or heart conditions.  The blood alcohol level test measures the concentration of alcohol in your blood. In most states, your blood alcohol level must be lower than 80 mg/dL (0.08%) to legally drive. However, many dangerous effects of alcohol can occur at much lower levels.  Alcohol directly impairs the normal chemical activity of the brain and is said to be a chemical depressant. Alcohol can cause drowsiness, stupor, respiratory failure, and coma. Other physical effects can include headache, vomiting, vomiting of blood, abdominal pain, a fast heartbeat, difficulty breathing, anxiety, and amnesia. Alcohol intoxication can also lead to dangerous and life-threatening activities, such as fighting, dangerous operation of vehicles or heavy machinery, and risky sexual behavior.  Alcohol can be especially dangerous when taken with other drugs. Some of these drugs are:   Sedatives.   Painkillers.   Marijuana.   Tranquilizers.   Antihistamines.   Muscle relaxants.   Seizure medicine.  Many of the effects of acute alcohol intoxication are temporary. However, repeated alcohol intoxication can lead to severe medical illnesses. If you have alcohol intoxication, you should:   Stay hydrated. Drink enough water and fluids to keep your urine clear or pale yellow. Avoid excessive caffeine because this can further lead to dehydration.   Eat a healthy diet. You may have residual nausea, headache, and loss of appetite, but it is still important that you maintain good nutrition. You can start with clear  liquids.   Take nonsteroidal anti-inflammatory medications as needed for headaches, but make sure to do so with small meals. You should avoid acetaminophen for several days after having alcohol intoxication because the combination of alcohol and acetaminophen can be toxic to your liver.  If you have frequent alcohol intoxication, ask your friends and family if they think you have a drinking problem. For further help, contact:   Your caregiver.   Alcoholics Anonymous (AA).   A drug or alcohol rehabilitation program.  SEEK MEDICAL CARE IF:    You have persistent vomiting.   You have persistent pain in any part of your body.   You do not feel better after a few days.  SEEK IMMEDIATE MEDICAL CARE IF:    You become shaky or tremble when you try to stop drinking.   You shake uncontrollably (seizure).   You throw up (vomit) blood. This may be bright red or it may look like black coffee grounds.   You have blood in the stool. This may be bright red or appear as a black, tarry, bad smelling stool.   You become lightheaded or faint.  ANY OF THESE SYMPTOMS MAY REPRESENT A SERIOUS PROBLEM THAT IS AN EMERGENCY. Do not wait to see if the symptoms will go away. Get medical help right away. Call your local emergency services (911 in [place] DO NOT drive yourself to the hospital.  MAKE SURE YOU:    Understand these instructions.   Will watch your condition.   Will get help right away if you are not doing well or get worse.  Document Released: 10/31/2004 Document Revised: 01/10/2011 Document Reviewed: 07/10/2009  [employer] Patient Information 2012 [employer].

## 2011-10-08 ENCOUNTER — Emergency Department (HOSPITAL_BASED_OUTPATIENT_CLINIC_OR_DEPARTMENT_OTHER)
Admission: EM | Admit: 2011-10-08 | Discharge: 2011-10-08 | Disposition: A | Discharge status: Home / Self Care | Payer: PRIVATE HEALTH INSURANCE | Attending: Emergency Medicine | Admitting: Emergency Medicine

## 2011-10-08 ENCOUNTER — Emergency Department (HOSPITAL_BASED_OUTPATIENT_CLINIC_OR_DEPARTMENT_OTHER): Payer: PRIVATE HEALTH INSURANCE

## 2011-10-08 ENCOUNTER — Encounter (HOSPITAL_BASED_OUTPATIENT_CLINIC_OR_DEPARTMENT_OTHER): Payer: Self-pay | Admitting: *Deleted

## 2011-10-08 DIAGNOSIS — Z882 Allergy status to sulfonamides status: Secondary | ICD-10-CM | POA: Insufficient documentation

## 2011-10-08 DIAGNOSIS — F17200 Nicotine dependence, unspecified, uncomplicated: Secondary | ICD-10-CM | POA: Insufficient documentation

## 2011-10-08 DIAGNOSIS — I10 Essential (primary) hypertension: Secondary | ICD-10-CM | POA: Insufficient documentation

## 2011-10-08 DIAGNOSIS — S20219A Contusion of unspecified front wall of thorax, initial encounter: Secondary | ICD-10-CM | POA: Insufficient documentation

## 2011-10-08 DIAGNOSIS — T07XXXA Unspecified multiple injuries, initial encounter: Secondary | ICD-10-CM
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
## 2011-10-08 MED ORDER — HYDROCODONE-ACETAMINOPHEN 5-325 MG PO TABS: 1.0000 | ORAL_TABLET | ORAL | Status: AC | PRN

## 2011-10-08 NOTE — ED Provider Notes (Signed)
History     CSN: [id number]  Arrival date & time 10/08/11  1806   First MD Initiated Contact with Patient 10/08/11 1829      Chief Complaint  Patient presents with  . Chest Pain    (Consider location/radiation/quality/duration/timing/severity/associated sxs/prior treatment) HPI Comments: Patient presents today with the chief complaint of rib pain on his left side. He fell off a boat on Sunday and hit the left side of his body on the dock. He states he has been in pain since the accident and rates the pain 8/10. He denies chest pain, shortness of breath, abdominal pain, back pain, nausea and vomiting. He states it hurts to cough, take deep breaths and laugh. His pain is also worse with movement and in the morning when he first wakes up. He has cracked his ribs twice in the past in this same area and states the pain feels similar as before. He has taken Ibuprofen for the pain which has brought no relief.  Patient is a 31 y.o. male presenting with chest pain. The history is provided by the patient.  Chest Pain The chest pain began 2 days ago. Chest pain occurs constantly. The chest pain is unchanged. The pain is associated with breathing, coughing, exertion and lifting. At its most intense, the pain is at 9/10. The pain is currently at 8/10. The quality of the pain is described as sharp. The pain radiates to the mid back. Chest pain is worsened by certain positions, deep breathing and exertion. Pertinent negatives for primary symptoms include no fever, no syncope, no shortness of breath, no cough, no wheezing, no palpitations, no abdominal pain, no nausea, no vomiting and no altered mental status. He tried NSAIDs for the symptoms. Past medical history comments: cracked ribs on left side     Past Medical History  Diagnosis Date  . Hypertension     Past Surgical History  Procedure Date  . Hand surgery     No family history on file.  History  Substance Use Topics  . Smoking status:  Current Everyday Smoker  . Smokeless tobacco: Not on file  . Alcohol Use: Yes      Review of Systems  Constitutional: Negative for fever and appetite change.  Respiratory: Negative for cough, shortness of breath and wheezing.   Cardiovascular: Negative for chest pain, palpitations and syncope.  Gastrointestinal: Negative for nausea, vomiting and abdominal pain.  Musculoskeletal:       Pain under left armpit that radiates to back  Neurological: Negative for headaches.  Psychiatric/Behavioral: Negative for altered mental status.    Allergies  Sulfa antibiotics  Home Medications   Current Outpatient Rx  Name Route Sig Dispense Refill  . CYCLOBENZAPRINE HCL 5 MG PO TABS Oral Take 5 mg by mouth 3 (three) times daily as needed. For back pain    . IBUPROFEN 200 MG PO TABS Oral Take 800 mg by mouth every 8 (eight) hours as needed. For pain    . CLEAR EYES OP Ophthalmic Apply 1 drop to eye daily as needed. For dry eyes      BP 152/97  Pulse 86  Temp 97.9 F (36.6 C) (Oral)  Resp 20  SpO2 99%  Physical Exam  Nursing note and vitals reviewed. Constitutional: He appears well-developed and well-nourished.       In no acute distress  Eyes: Pupils are equal, round, and reactive to light.  Cardiovascular: Normal rate, regular rhythm and normal heart sounds.   Pulmonary/Chest: Effort normal  and breath sounds normal.  Abdominal: Soft. Bowel sounds are normal. He exhibits no distension. There is no splenomegaly. There is no tenderness. There is no rebound and no CVA tenderness.       Spleen is nontender to palpation  Musculoskeletal:       Tenderness to palpation along the left anterior chest wall, under the left arm pit and to the mid back  Skin:       Multiple abrasions to the left thorax without erythema or drainage.    ED Course  Procedures (including critical care time)  Labs Reviewed - No data to display No results found. BP 152/97  Pulse 86  Temp 97.9 F (36.6 C)  (Oral)  Resp 20  SpO2 99%     No diagnosis found.  1. Contusion chest wall   MDM  Dg Ribs Unilateral W/chest Left  10/08/2011  *RADIOLOGY REPORT*  Clinical Data: Chest pain, trauma  LEFT RIBS AND CHEST - 3+ VIEW  Comparison: None.  Findings: Normal cardiac silhouette.  No pulmonary contusion, pleural fluid or pneumothorax.  Limited views of the left ribs demonstrate posterior left ninth and tenth rib fractures which appears chronic.  No clear evidence of acute fracture.  IMPRESSION:  1.  Likely healed fractures of the posterior left ninth and tenth ribs.  2 . No clear evidence of acute fracture. 3.  No evidence of pneumothorax   Original Report Authenticated By: Genevive Bi, M.D.    Patient unable to receive pain medications in the ED due to driving himself home. Chest and rib xray reveal no evidence of an acute fracture or pneumothorax. Patient will return if he notices abdominal pain, chest pain or shortness of breath.        Rodena Medin, PA-C 10/10/11 [phone]

## 2011-10-08 NOTE — ED Notes (Signed)
Left rib pain. 2 days he fell on the dock while getting out of a boat.

## 2011-10-10 NOTE — ED Provider Notes (Signed)
Medical screening examination/treatment/procedure(s) were performed by non-physician practitioner and as supervising physician I was immediately available for consultation/collaboration.  Jones Skene, M.D.  Medical screening examination/treatment/procedure(s) were performed by non-physician practitioner and as supervising physician I was immediately available for consultation/collaboration.  Jones Skene, M.D.     Jones Skene, MD 10/10/11 1309

## 2012-03-02 ENCOUNTER — Other Ambulatory Visit: Payer: Self-pay | Admitting: Family Medicine

## 2012-03-02 ENCOUNTER — Ambulatory Visit (INDEPENDENT_AMBULATORY_CARE_PROVIDER_SITE_OTHER): Payer: PRIVATE HEALTH INSURANCE

## 2012-03-02 ENCOUNTER — Ambulatory Visit: Payer: Self-pay | Admitting: Family Medicine

## 2012-03-02 ENCOUNTER — Encounter: Payer: Self-pay | Admitting: Family Medicine

## 2012-03-02 ENCOUNTER — Ambulatory Visit (INDEPENDENT_AMBULATORY_CARE_PROVIDER_SITE_OTHER): Payer: PRIVATE HEALTH INSURANCE | Admitting: Family Medicine

## 2012-03-02 VITALS — BP 139/83 | HR 98 | Ht 67.5 in | Wt 130.0 lb

## 2012-03-02 DIAGNOSIS — I10 Essential (primary) hypertension: Secondary | ICD-10-CM

## 2012-03-02 DIAGNOSIS — G8929 Other chronic pain: Secondary | ICD-10-CM

## 2012-03-02 DIAGNOSIS — M549 Dorsalgia, unspecified: Secondary | ICD-10-CM

## 2012-03-02 HISTORY — DX: Other chronic pain: G89.29

## 2012-03-02 HISTORY — DX: Essential (primary) hypertension: I10

## 2012-03-02 IMAGING — CR DG LUMBAR SPINE 2-3V
3 series · 3 of 3 positions shown · non-contrast
Comparison: None.

CLINICAL DATA: Chronic thoracolumbar pain.

LUMBAR SPINE - 2-3 VIEW

[view not recorded (1 of 3)]
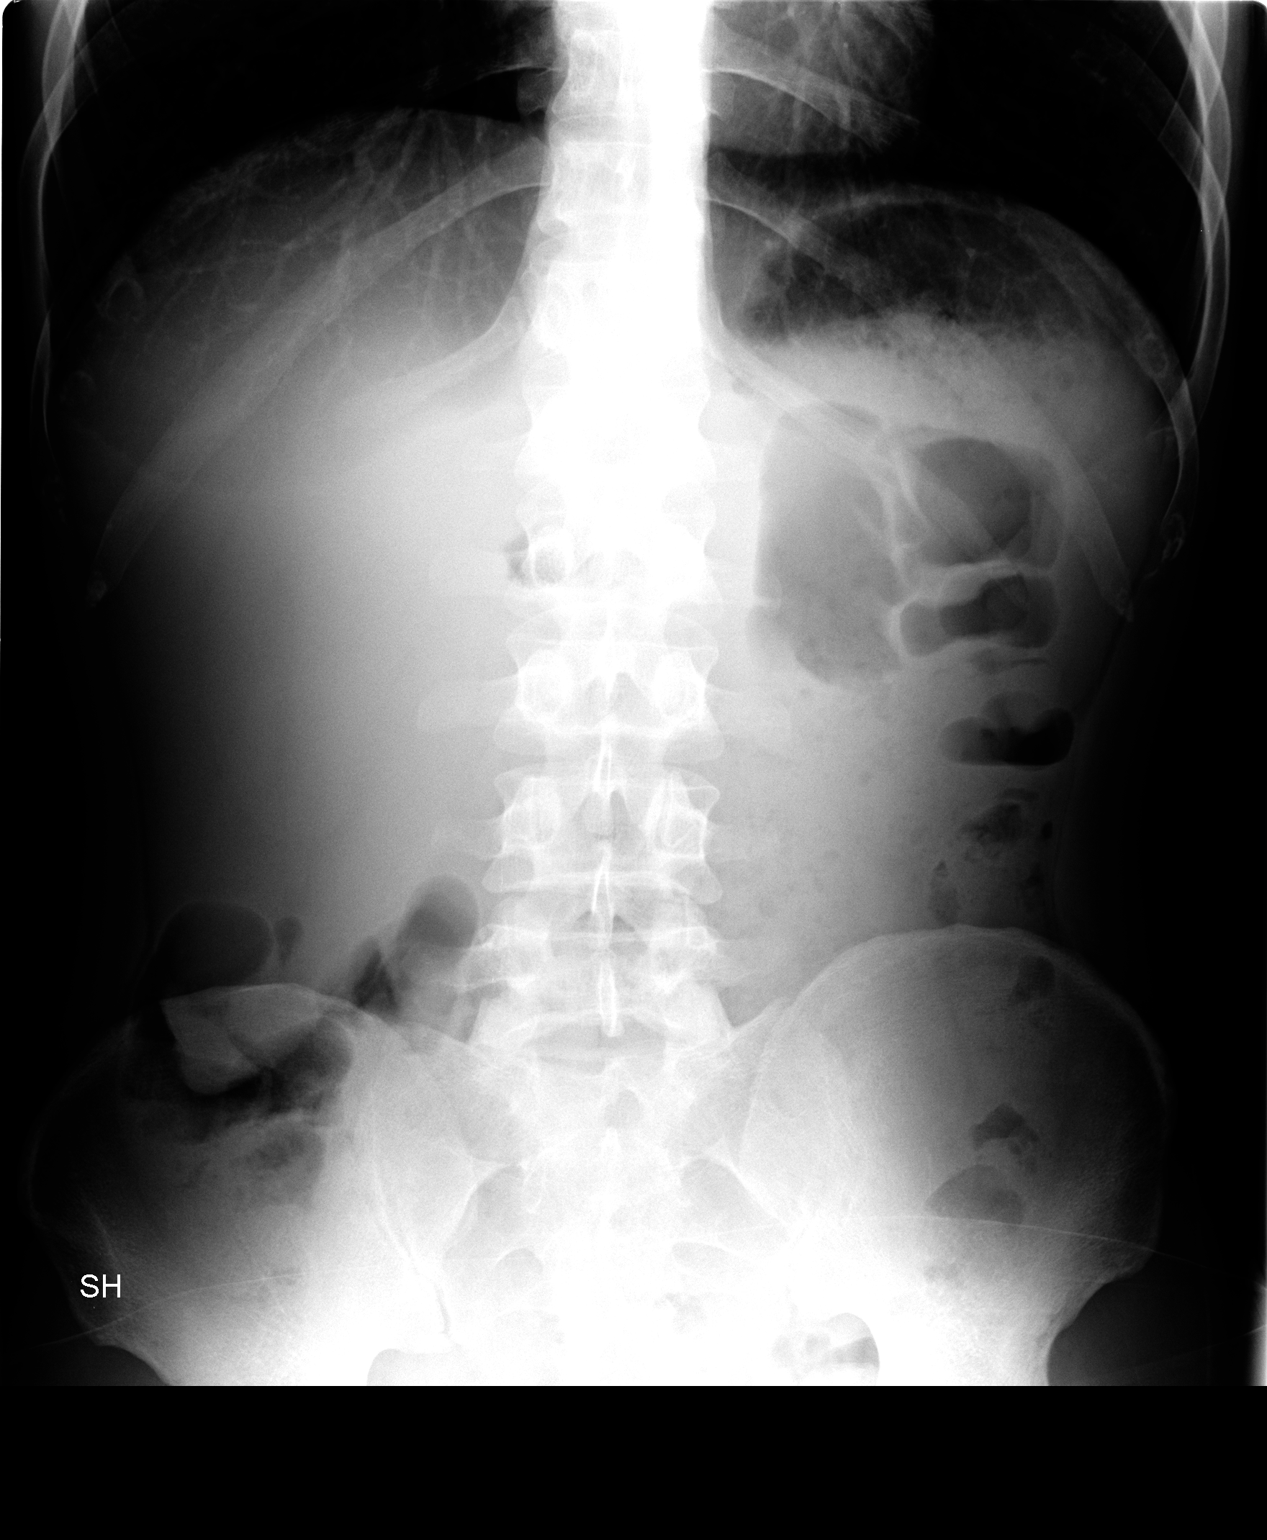

[view not recorded (2 of 3)]
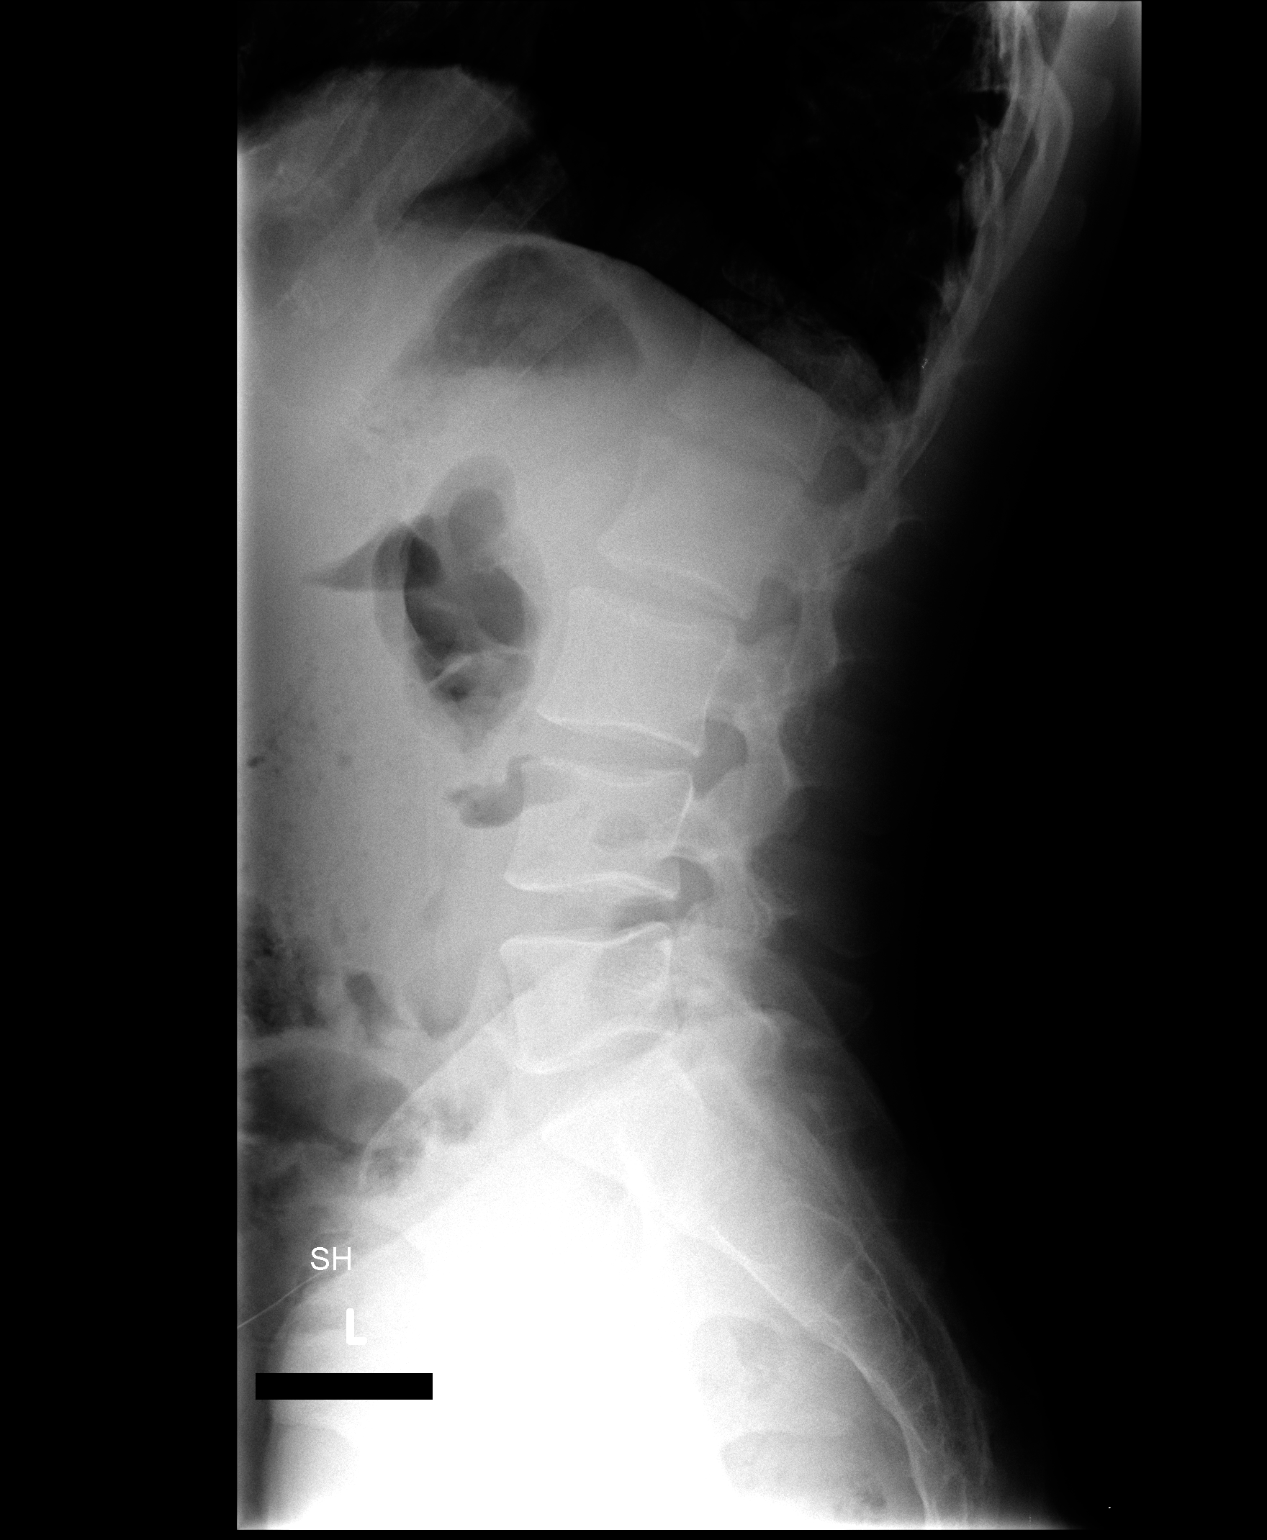

[view not recorded (3 of 3)]
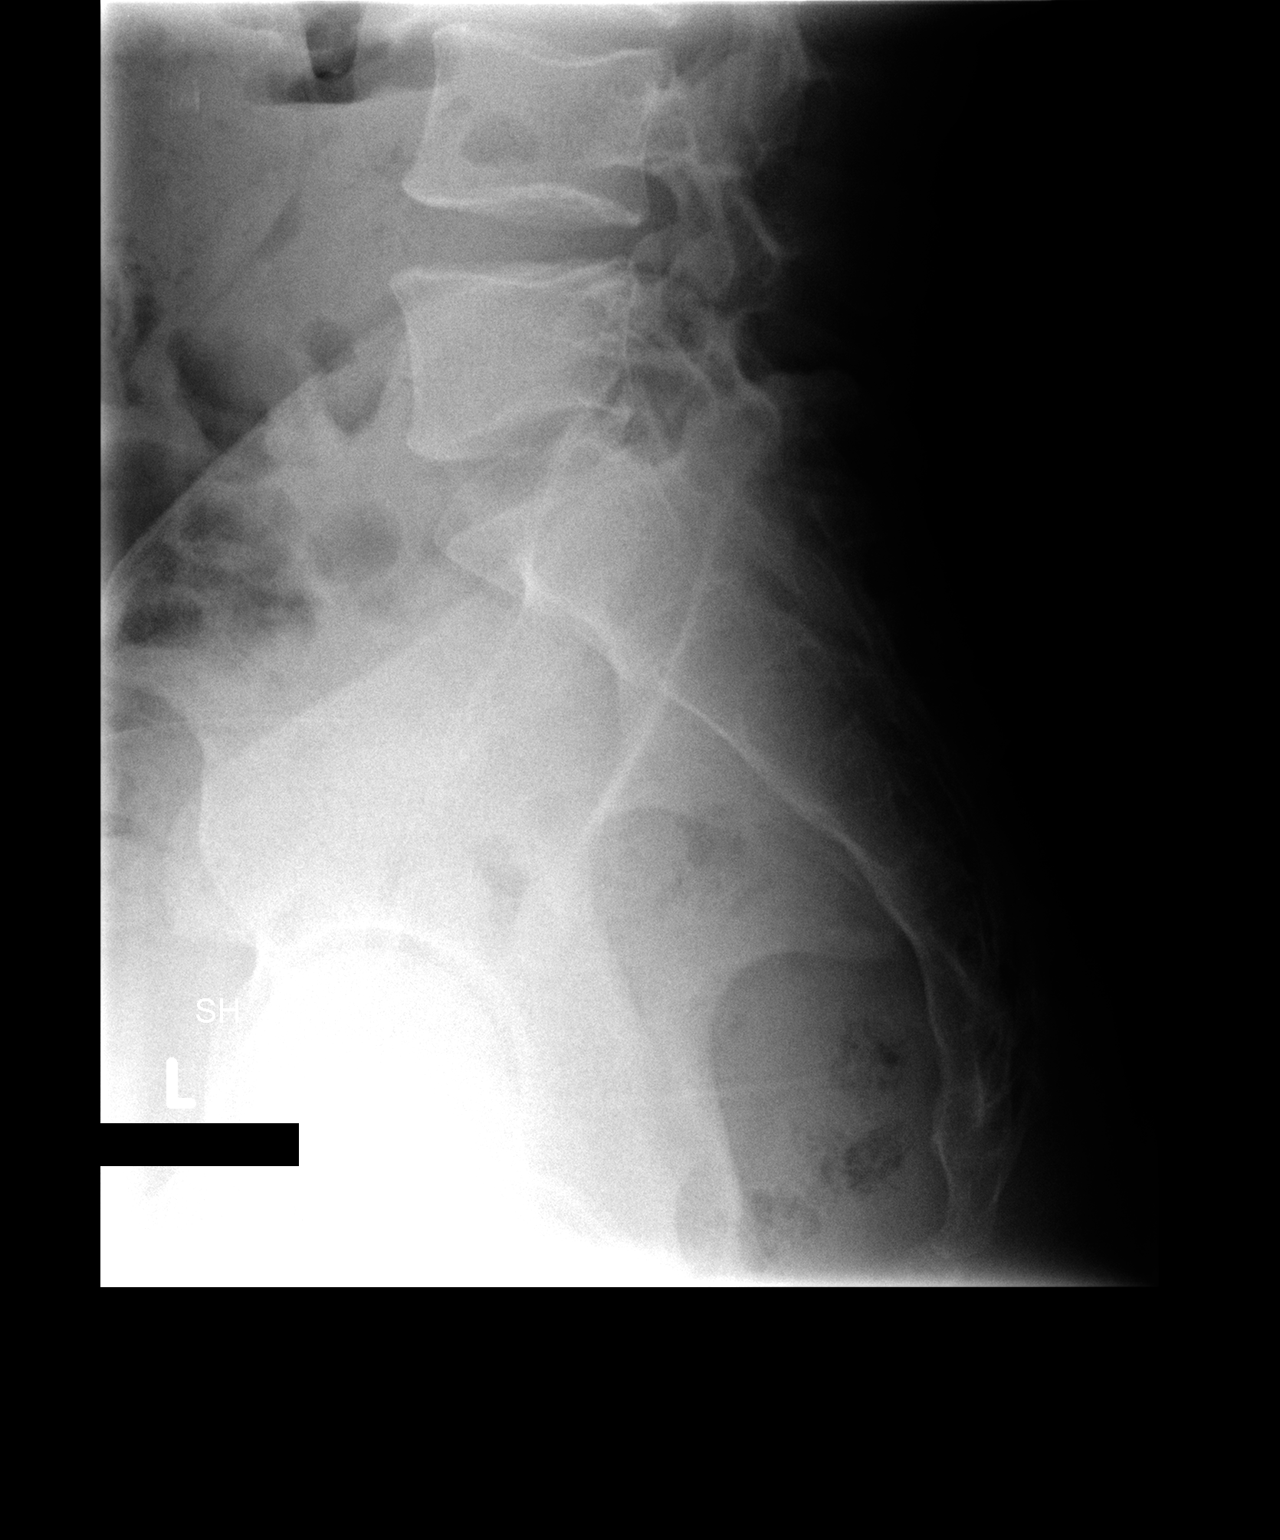

[3 of 3 positions shown; findings below may reference images not displayed]

FINDINGS: Five lumbar-type vertebral bodies show normal alignment.
No degenerative changes.  No fracture.  No focal lesion.
Sacroiliac joints appear normal.
IMPRESSION: Normal lumbar radiographs.

## 2012-03-02 IMAGING — CR DG THORACIC SPINE 3V
3 series · 3 of 3 positions shown · non-contrast
Comparison: [DATE]

CLINICAL DATA: Chronic thoracolumbar pain

THORACIC SPINE - 2 VIEW + SWIMMERS

[view not recorded (1 of 3)]
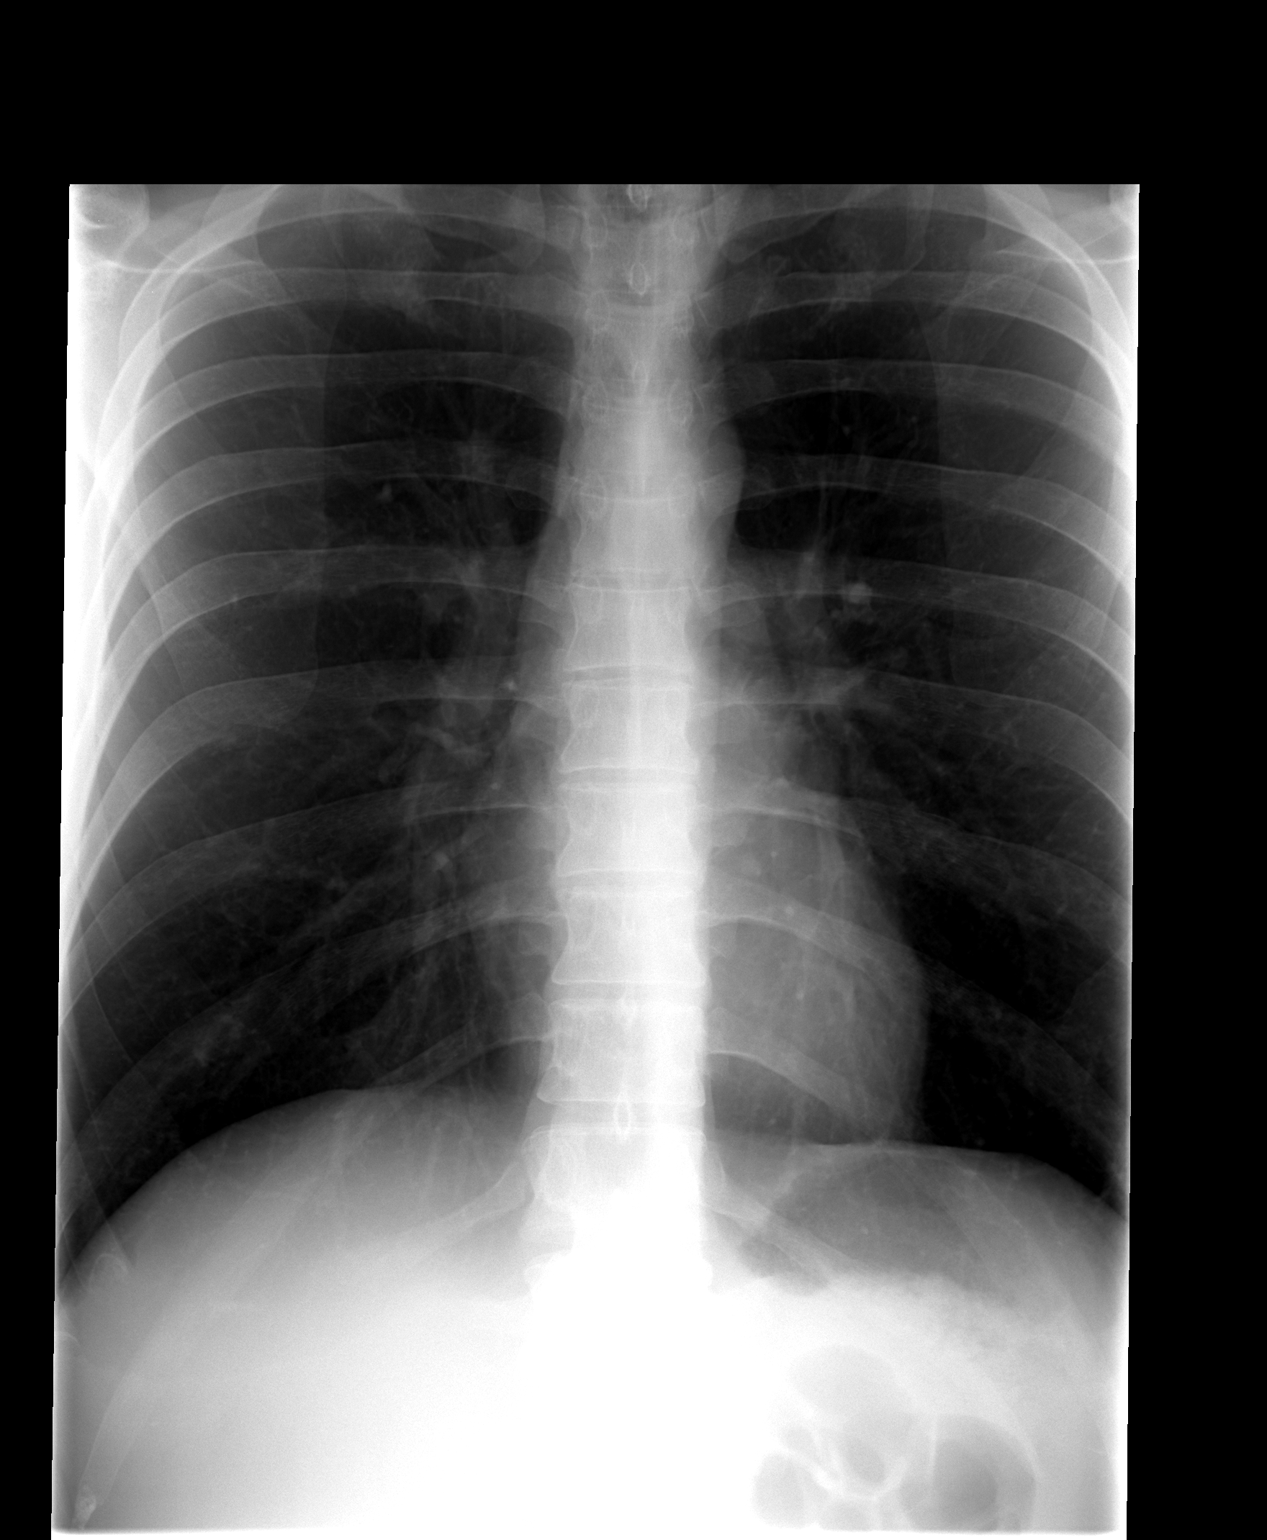

[view not recorded (2 of 3)]
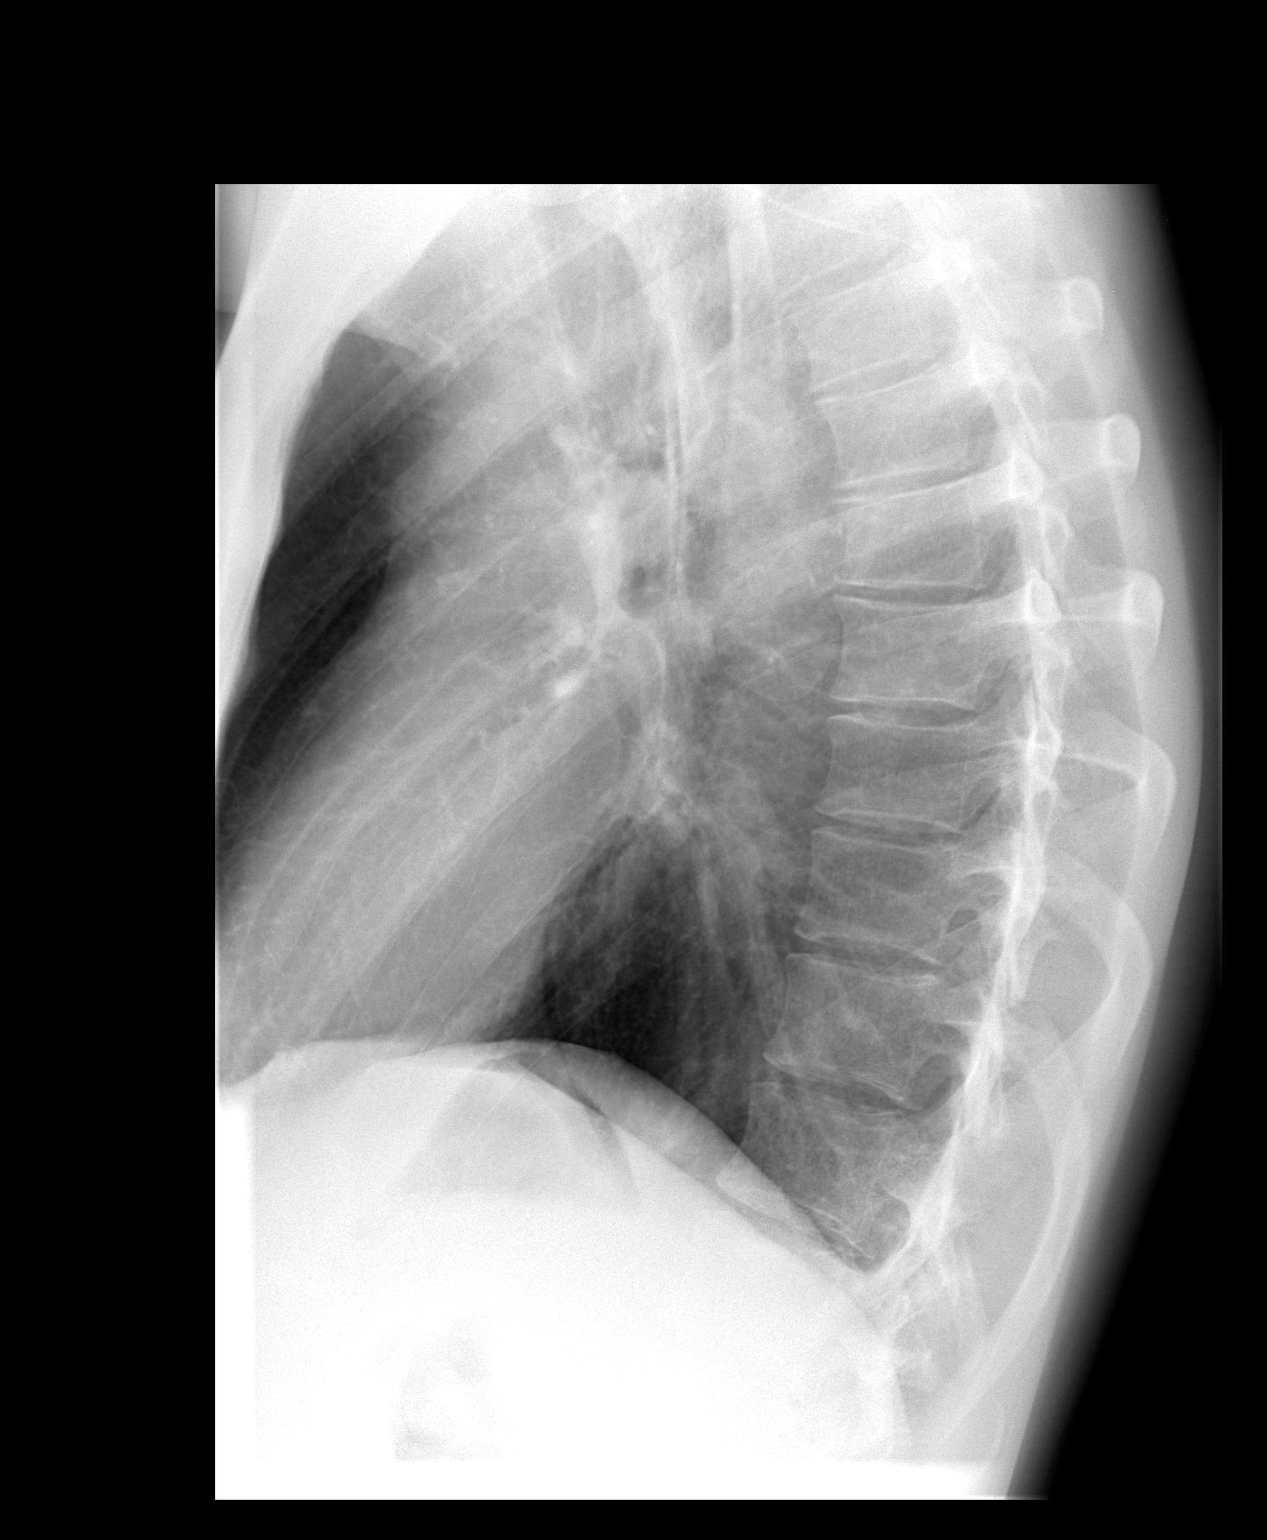

[view not recorded (3 of 3)]
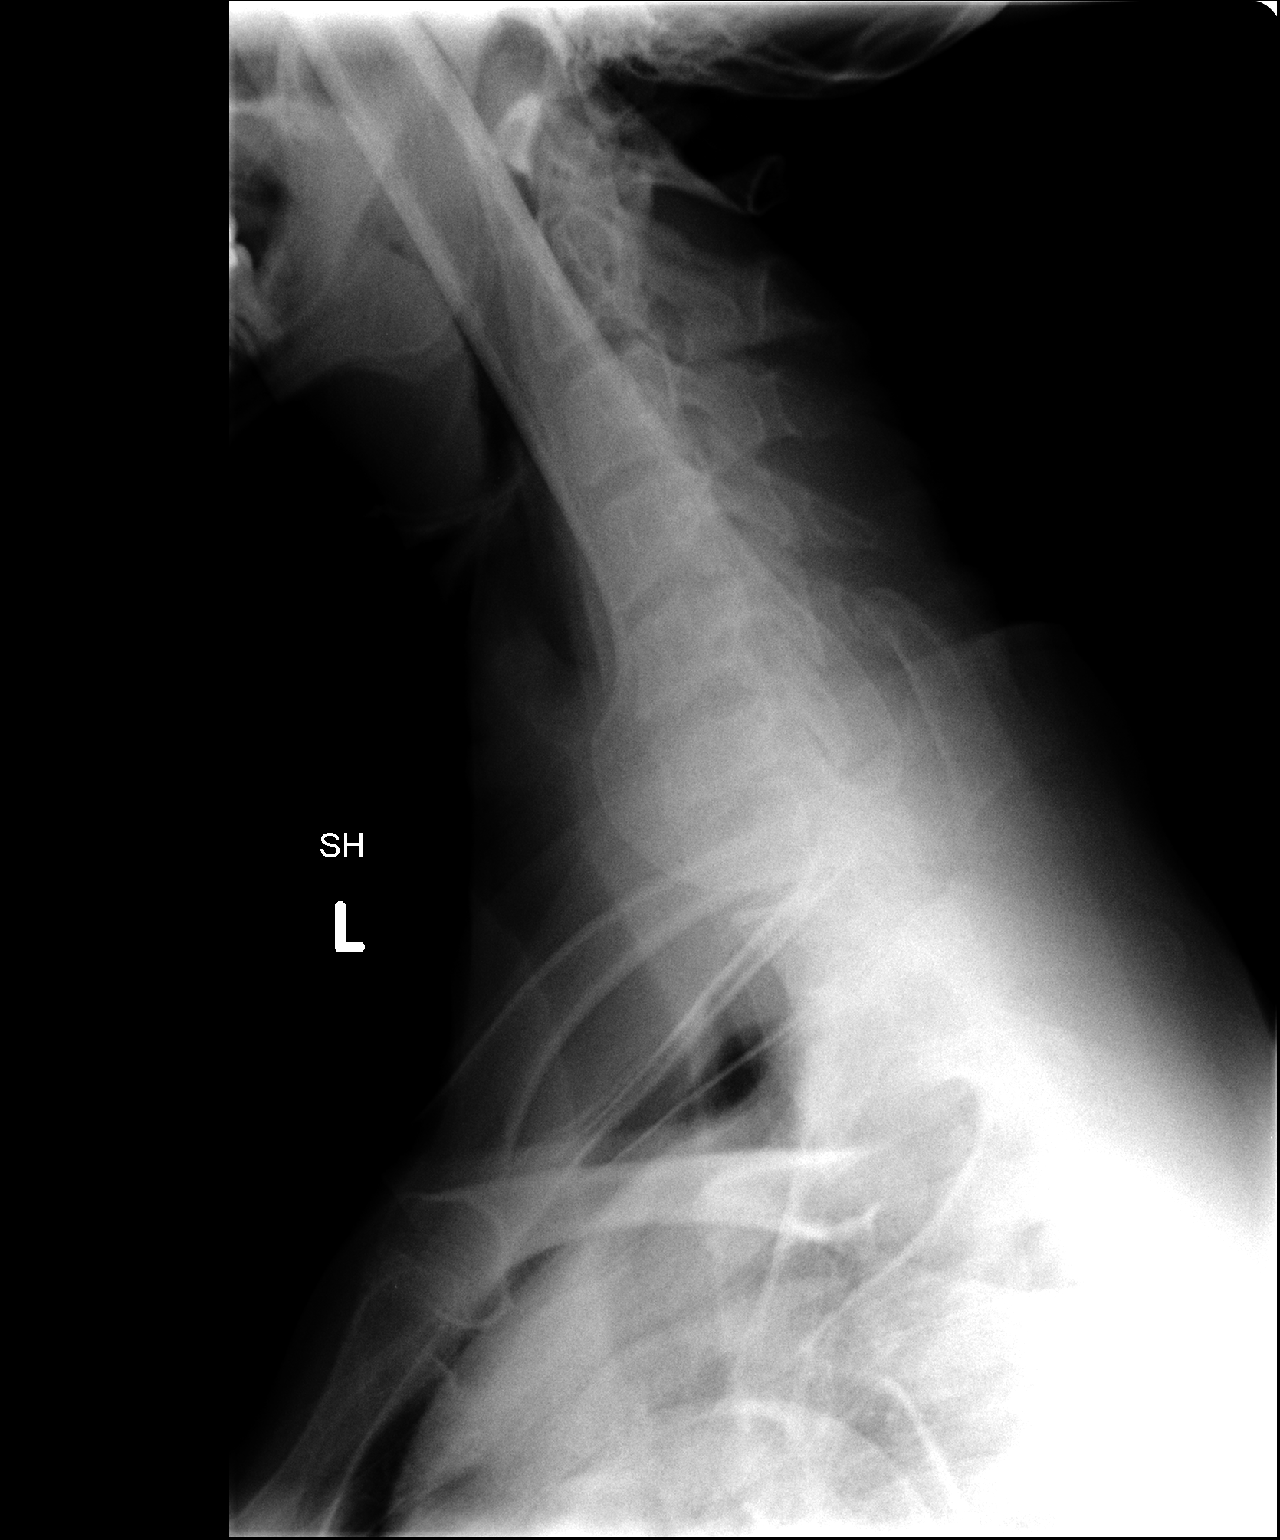

[3 of 3 positions shown; findings below may reference images not displayed]

FINDINGS: No significant malalignment.  No evidence of fracture.
No disc space narrowing.  No other focal lesion.  Posteromedial
ribs appear normal.
IMPRESSION: Normal radiographs.

## 2012-03-02 MED ORDER — CYCLOBENZAPRINE HCL 5 MG PO TABS: 10.0000 mg | ORAL_TABLET | Freq: Every day | ORAL | Status: DC

## 2012-03-02 MED ORDER — DICLOFENAC SODIUM 50 MG PO TBEC: DELAYED_RELEASE_TABLET | ORAL | Status: DC

## 2012-03-02 NOTE — Progress Notes (Signed)
CC: Zachary Mata is a 32 y.o. male is here for Establish Care and chronic back pain   Subjective: HPI:  Patient presents to establish care.  Only acute complaint involves back pain. It has been present for 3-4 months. It had a gradual onset and over the past 3 months has not gotten better or worse. Is described as moderate to severe in severity. It is localized to the midline of the back midway between the shoulder blades and the top of his pelvis. It occasionally radiates towards the shoulder blades but in no other direction. It is worse when lifting heavy objects at work or in the evening hours after a full day of work. It is present in the morning but typically mild, he occasionally has trouble getting out of bed due to pain. Months ago he would wake up from the pain but this has not been occurring recently. He denies any history of trauma to the back or significant accidents. He denies motor or sensory disturbances, saddle paresthesia, weakness of the extremities, bowel or bladder incontinence, personal history of cancer, nor skin changes overlying the site of his pain. He is unable to pinpoint any specific activity, position, or movement that reproduces his pain. He describes this discomfort as someone burying a knuckle into his back.  Review of Systems - General ROS: negative for - chills, fever, night sweats, weight gain or weight loss Ophthalmic ROS: negative for - decreased vision Psychological ROS: negative for - anxiety or depression ENT ROS: negative for - hearing change, nasal congestion, tinnitus or allergies Hematological and Lymphatic ROS: negative for - bleeding problems, bruising or swollen lymph nodes Breast ROS: negative Respiratory ROS: no cough, shortness of breath, or wheezing Cardiovascular ROS: no chest pain or dyspnea on exertion Gastrointestinal ROS: no abdominal pain, change in bowel habits, or black or bloody stools Genito-Urinary ROS: negative for - genital  discharge, genital ulcers, incontinence or abnormal bleeding from genitals Musculoskeletal ROS: negative for - joint pain or muscle pain other than that described above Neurological ROS: negative for - headaches or memory loss Dermatological ROS: negative for lumps, mole changes, rash and skin lesion changes  Past Medical History  Diagnosis Date  . Hypertension   . Chronic back pain 03/02/2012  . Essential hypertension 03/02/2012     Family History  Problem Relation Age of Onset  . Breast cancer       History  Substance Use Topics  . Smoking status: Current Every Day Smoker  . Smokeless tobacco: Not on file  . Alcohol Use: Yes     Objective: Filed Vitals:   03/02/12 1500  BP: 139/83  Pulse: 98    General: Alert and Oriented, No Acute Distress HEENT: Pupils equal, round, reactive to light. Conjunctivae clear.  Moist mucous membranes  Lungs: Clear to auscultation bilaterally, no wheezing/ronchi/rales.  Comfortable work of breathing. Good air movement. Cardiac: Regular rate and rhythm. Normal S1/S2.  No murmurs, rubs, nor gallops.   Back: Stork test reproduces pain bilaterally, full range of motion of flexion and extension, at rest posture shows moderate thoracic kyphosis, pain is localized to the midline overlying the spinous process between T10 and L2. No paraspinal muscle tenderness Extremities: No peripheral edema.  Strong peripheral pulses. C5, C6, L4 DTRs two over four bilaterally, light touch sensation is intact in all dermatomes of the lower extremity. Gait unremarkable Mental Status: No depression, anxiety, nor agitation. Skin: Warm and dry.  Assessment & Plan: Zachary Mata was seen today for establish  care and chronic back pain.  Diagnoses and associated orders for this visit:  Chronic back pain - cyclobenzaprine (FLEXERIL) 5 MG tablet; Take 2 tablets (10 mg total) by mouth at bedtime. For back pain - diclofenac (VOLTAREN) 50 MG EC tablet; Take one tablet every 8 hours  only as needed for pain, take with small snack. - DG Lumbar Spine 2-3 Views; Future - Cancel: DG Thoracic Spine 2 View; Future     Given the duration of his back pain we will obtain films today . We'll start with Flexeril as he reports this has helped him sleep through the back pain in the past, and see how effective diclofenac is. Followup will be based on above findings .  30 minutes spent face-to-face during visit today of which at least 50% was counseling or coordinating care regarding chronic back pain.

## 2012-03-03 ENCOUNTER — Telehealth: Payer: Self-pay | Admitting: Family Medicine

## 2012-03-03 DIAGNOSIS — G8929 Other chronic pain: Secondary | ICD-10-CM

## 2012-03-03 DIAGNOSIS — M549 Dorsalgia, unspecified: Secondary | ICD-10-CM

## 2012-03-03 NOTE — Telephone Encounter (Signed)
Sue Lush, Will you please let Mr. Desroches know that his x-rays looked good and thankfully did not show any fractures, bone damage, nor joint damage in the back.  The next step in management would be to have formal physical therapy possibly for only a few visits they could teach him rehabilitation exercises that he could do in his home.  I'd be willing to write him out of work on the mornings or afternoons he is scheduled for formal physical therapy due to the importance of physical therapy.  I'd like him to follow up with me 3-4 weeks after starting therapy, I've placed the referral.

## 2012-03-03 NOTE — Telephone Encounter (Signed)
Home phone not working, cell was wife's number; left message on cell for pt to call back

## 2012-03-03 NOTE — Telephone Encounter (Signed)
Spoke with wife;pt's phone sometimes doesn't work. Gave wife results and she will have pt to call after 4 or he will leave a message

## 2012-04-08 ENCOUNTER — Ambulatory Visit: Payer: No Typology Code available for payment source

## 2012-04-08 ENCOUNTER — Ambulatory Visit (INDEPENDENT_AMBULATORY_CARE_PROVIDER_SITE_OTHER): Payer: No Typology Code available for payment source | Admitting: Family Medicine

## 2012-04-08 VITALS — BP 142/88 | HR 80 | Temp 98.4°F | Resp 16 | Ht 66.0 in | Wt 134.8 lb

## 2012-04-08 DIAGNOSIS — M79631 Pain in right forearm: Secondary | ICD-10-CM

## 2012-04-08 DIAGNOSIS — M25521 Pain in right elbow: Secondary | ICD-10-CM

## 2012-04-08 DIAGNOSIS — M778 Other enthesopathies, not elsewhere classified: Secondary | ICD-10-CM

## 2012-04-08 DIAGNOSIS — M79609 Pain in unspecified limb: Secondary | ICD-10-CM

## 2012-04-08 DIAGNOSIS — M25529 Pain in unspecified elbow: Secondary | ICD-10-CM

## 2012-04-08 IMAGING — CR DG ELBOW COMPLETE 3+V*R*
3 series · 3 of 3 positions shown · non-contrast
Comparison: None.

CLINICAL DATA: Injured elbow chopping PERNELL

RIGHT ELBOW - COMPLETE 3+ VIEW

[AP]
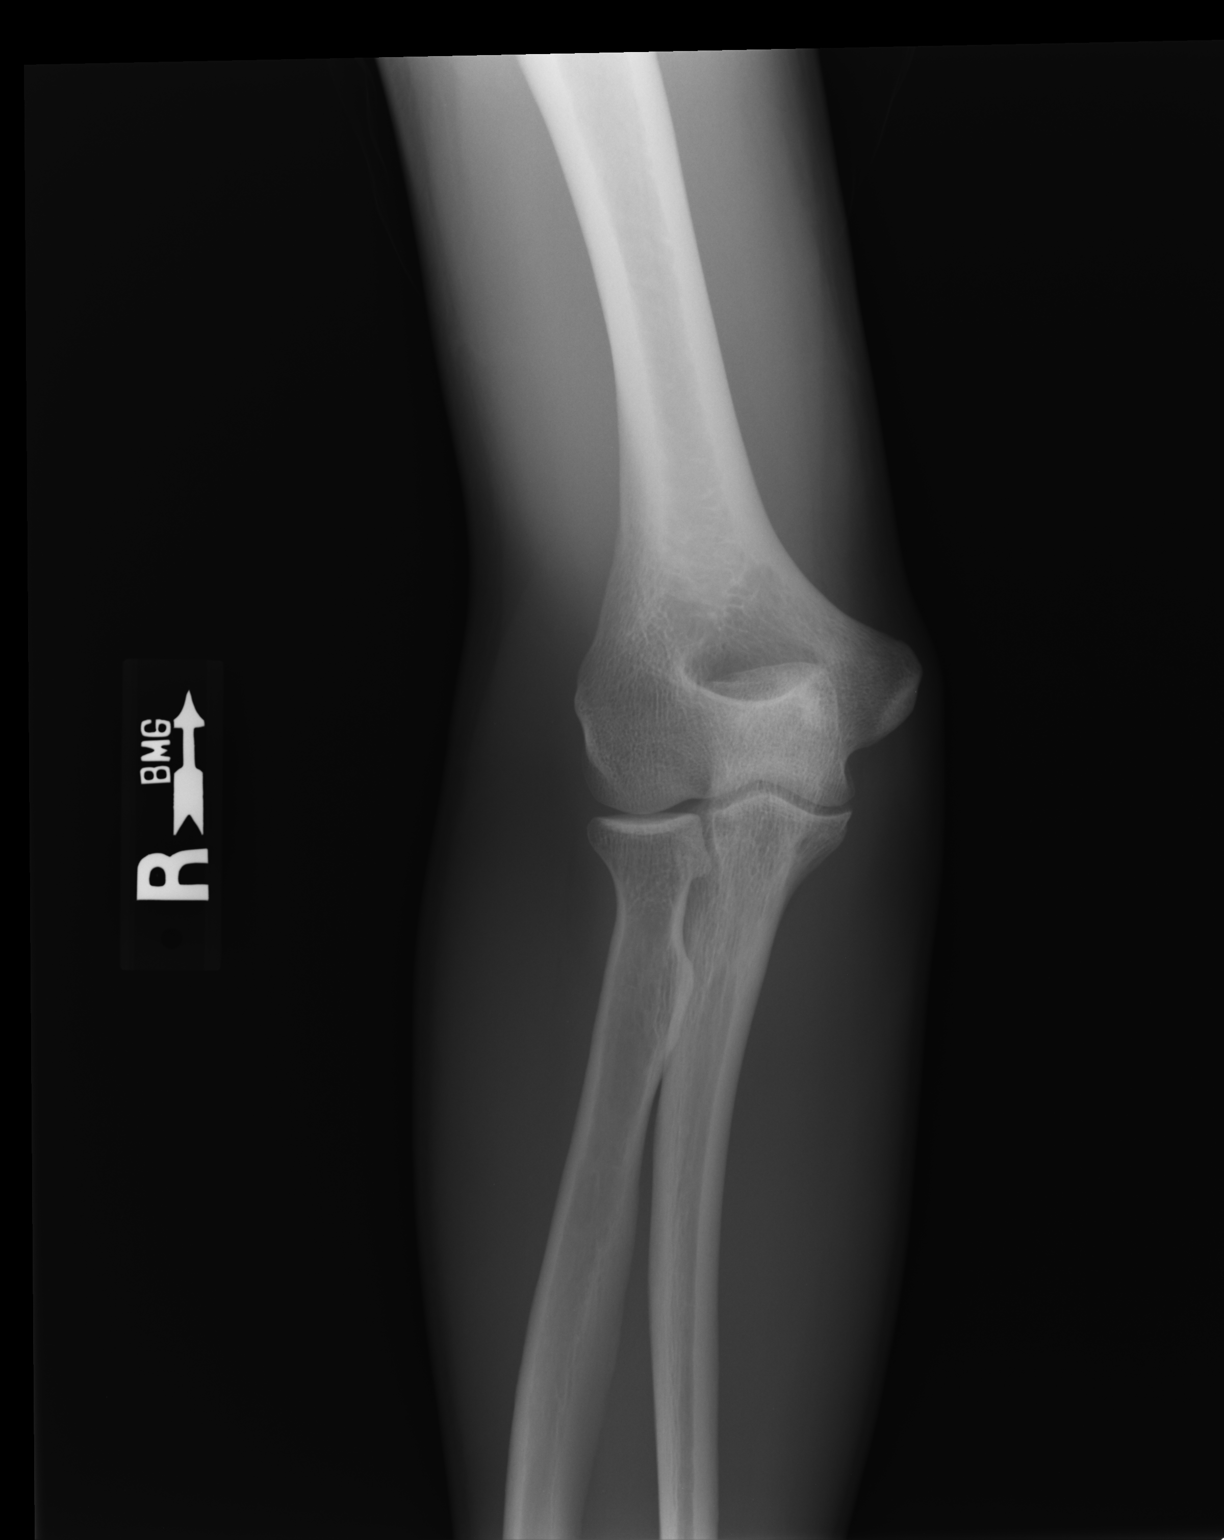

[ap obl ext rot]
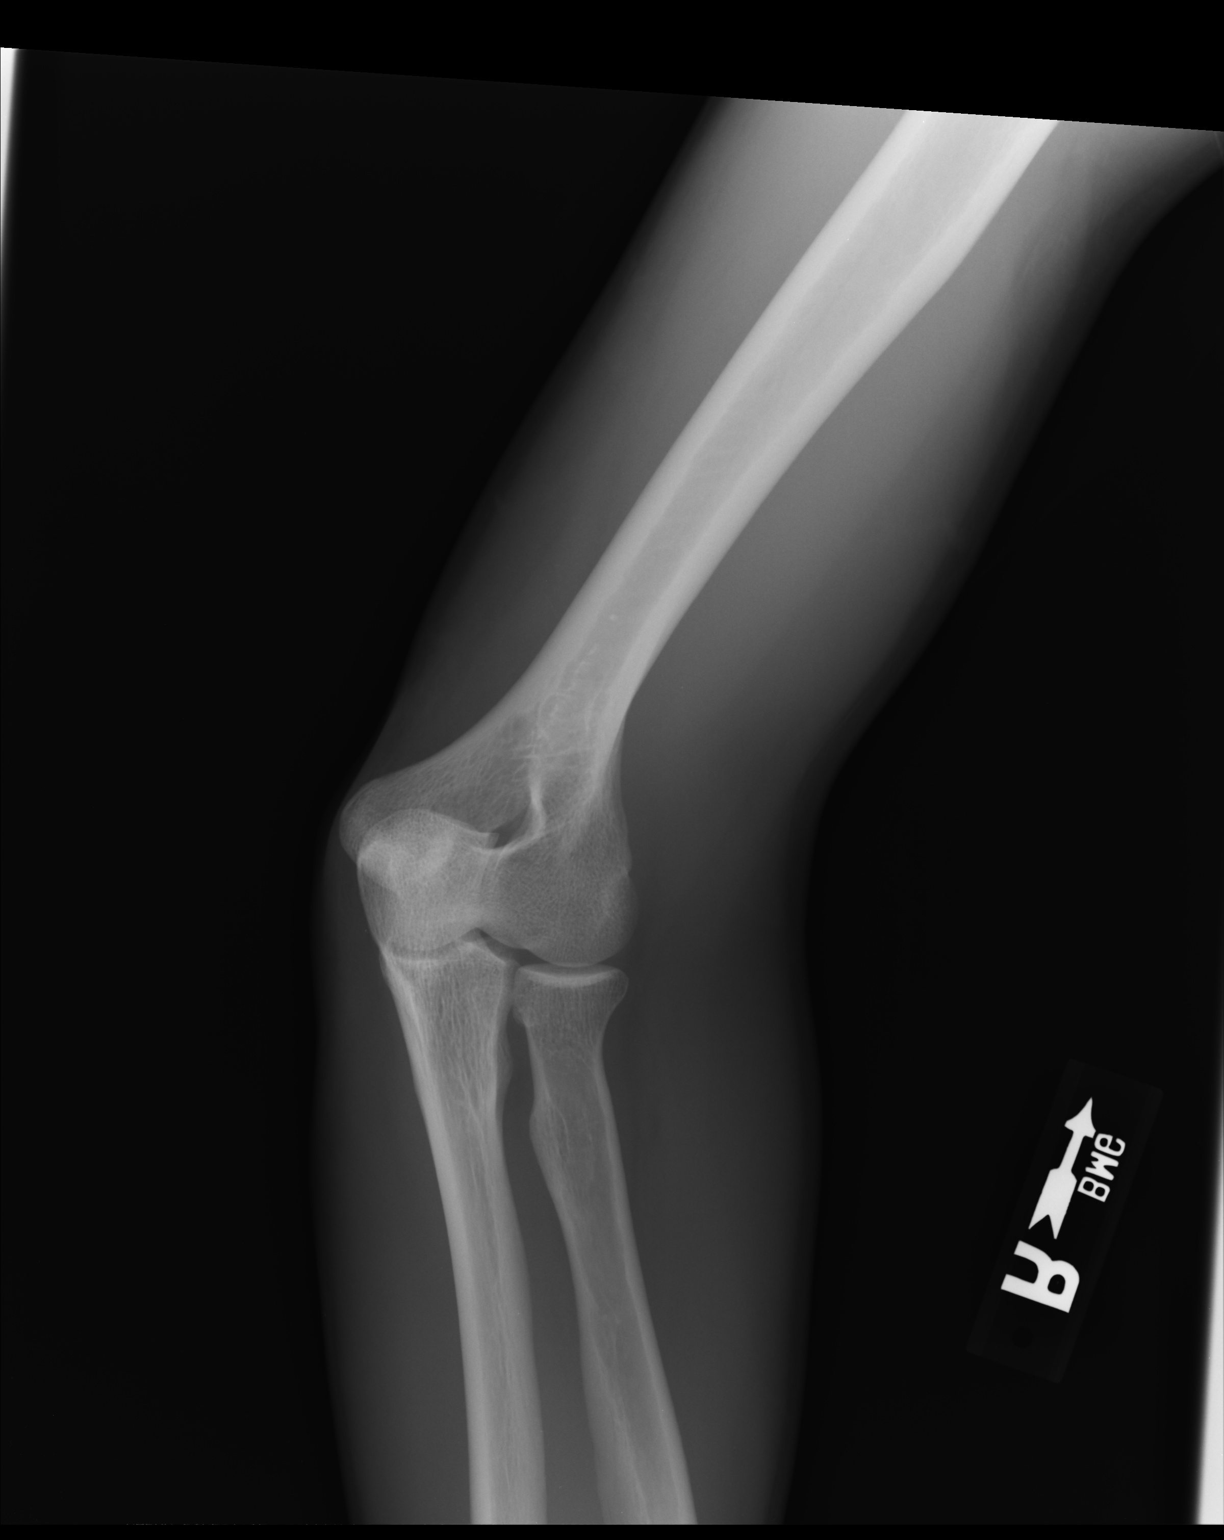

[lateral]
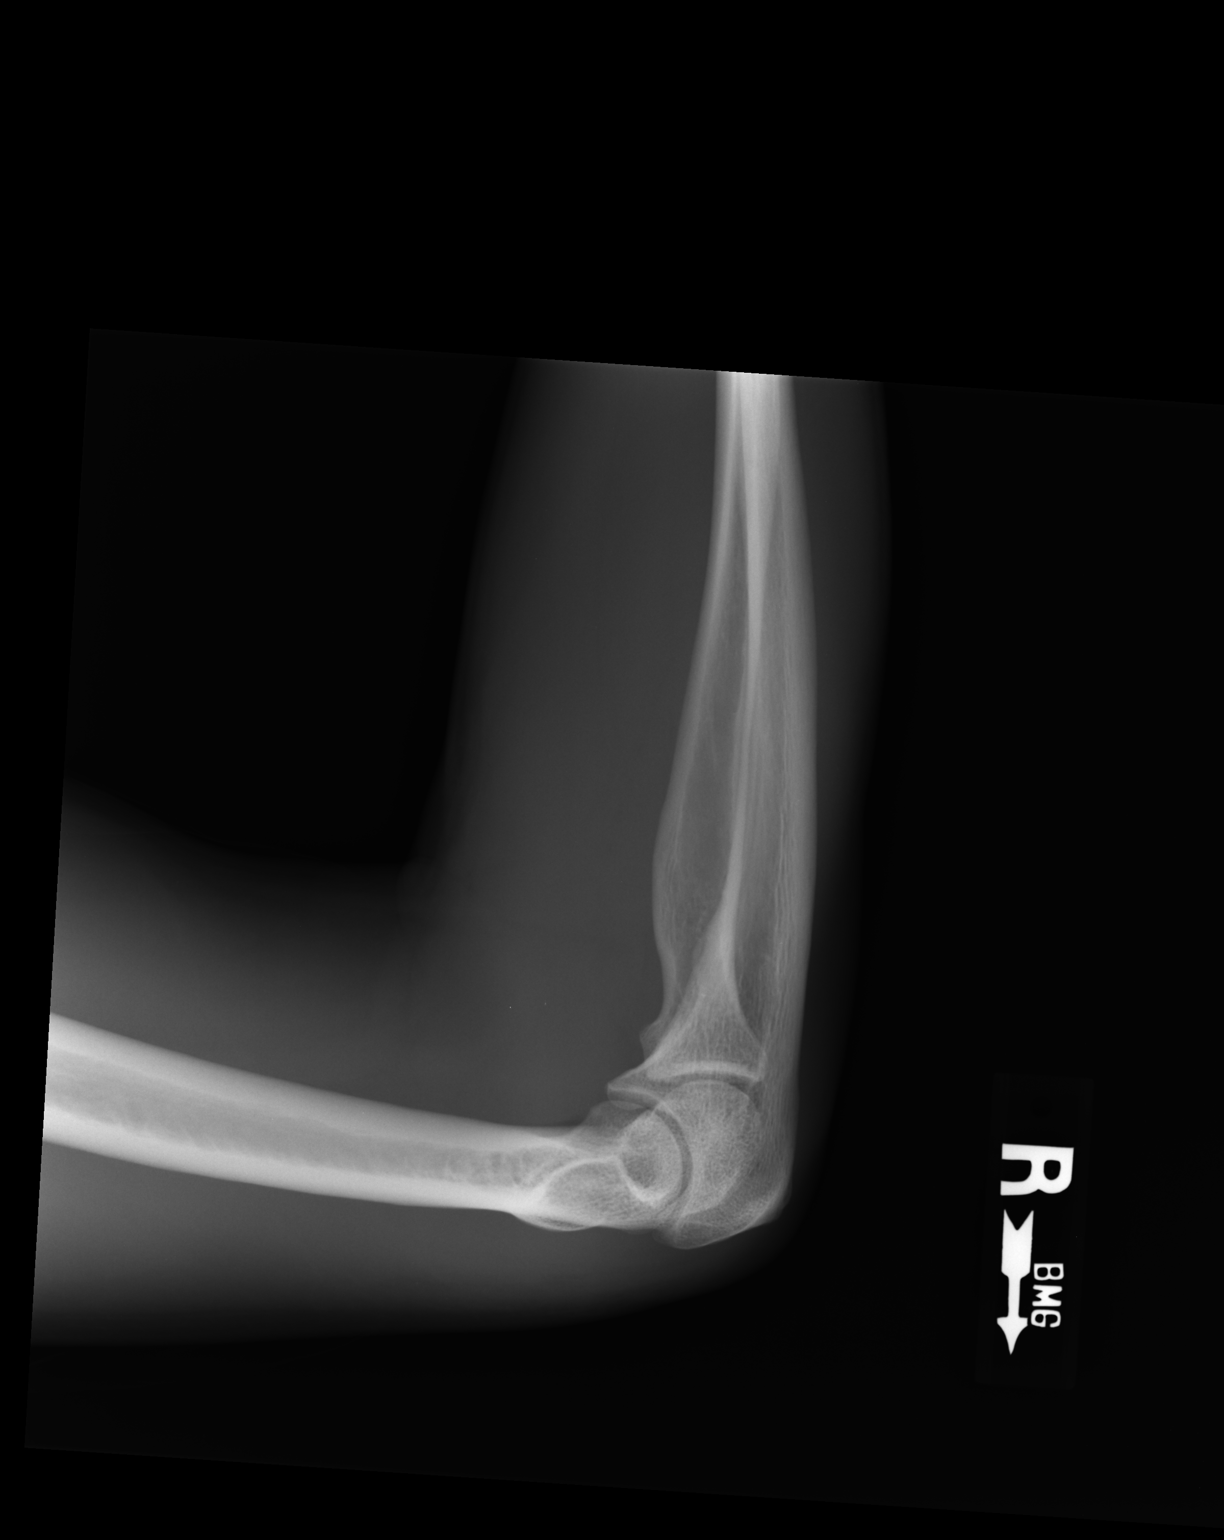

[3 of 3 positions shown; findings below may reference images not displayed]

FINDINGS: No acute fracture is seen.  Alignment is normal and joint
spaces are normal.  No joint effusion is seen.
IMPRESSION: Negative.

## 2012-04-08 IMAGING — CR DG FOREARM 2V*R*
2 series · 2 of 2 positions shown · non-contrast
Comparison: None.

CLINICAL DATA: Pain.

RIGHT FOREARM - 2 VIEW

[AP]
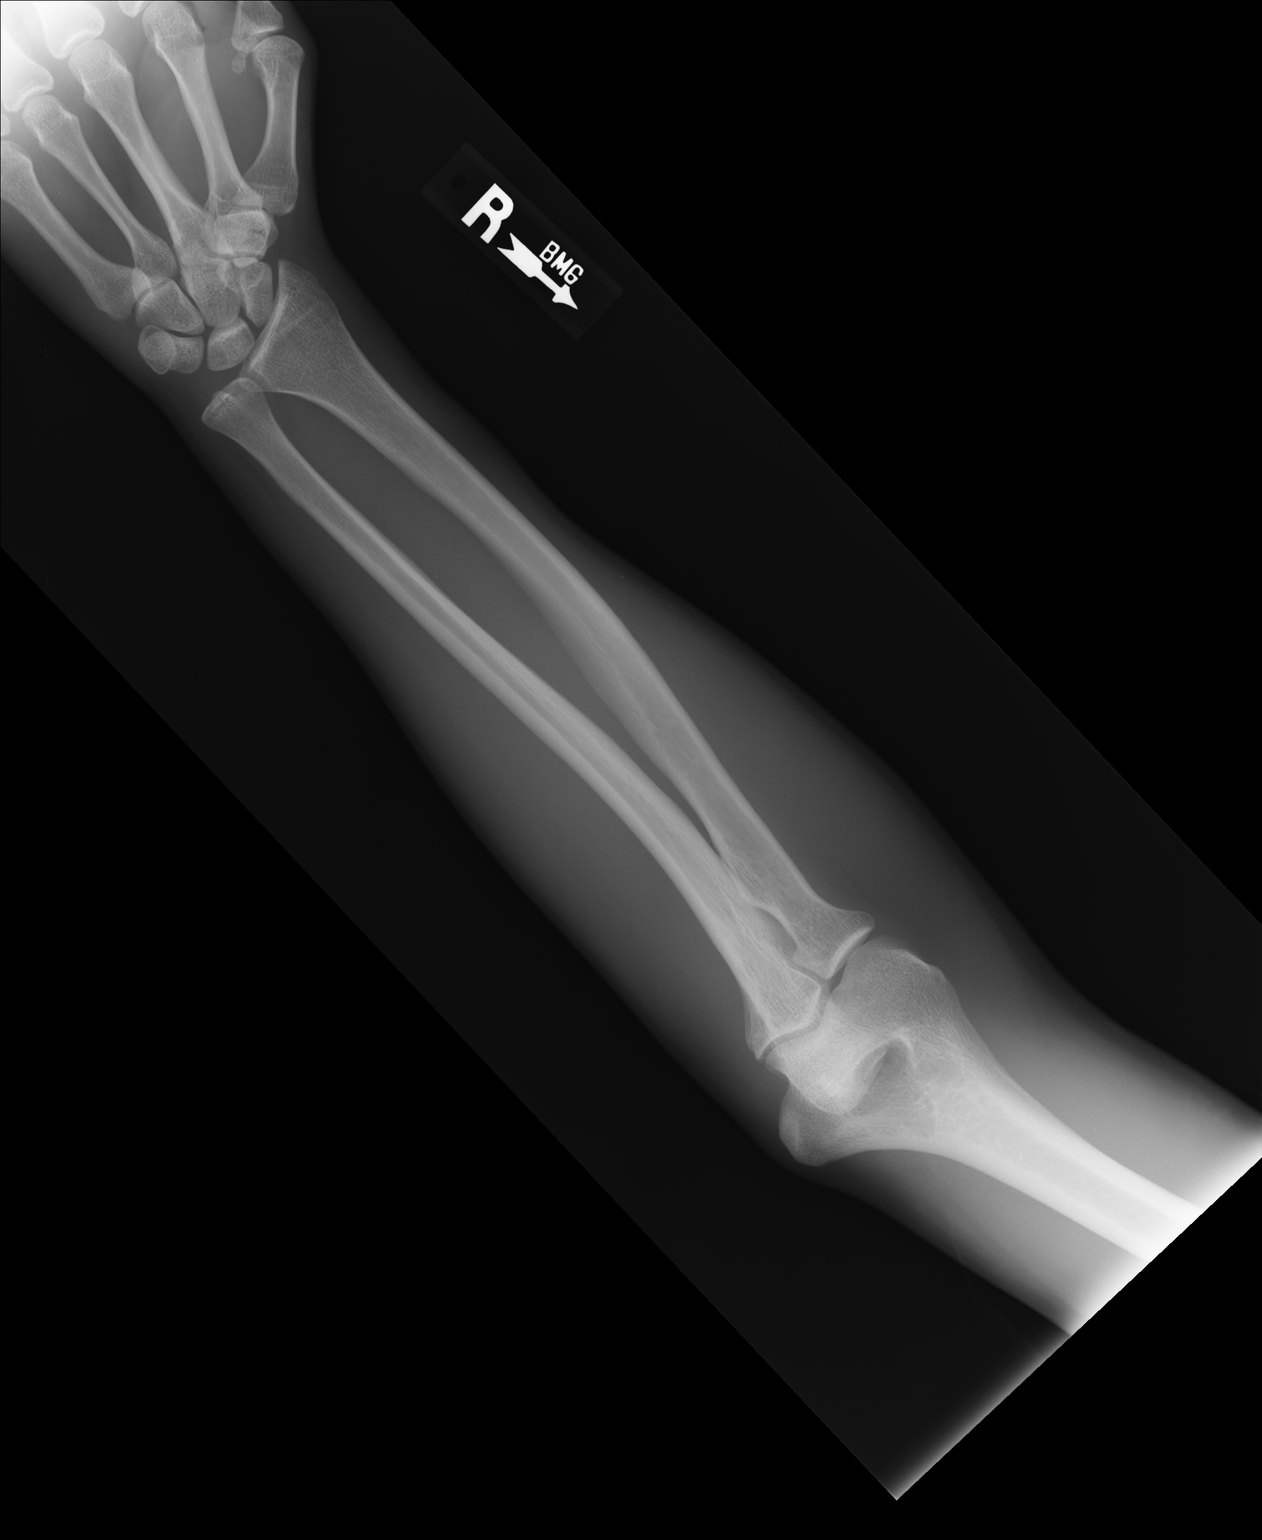

[lateral]
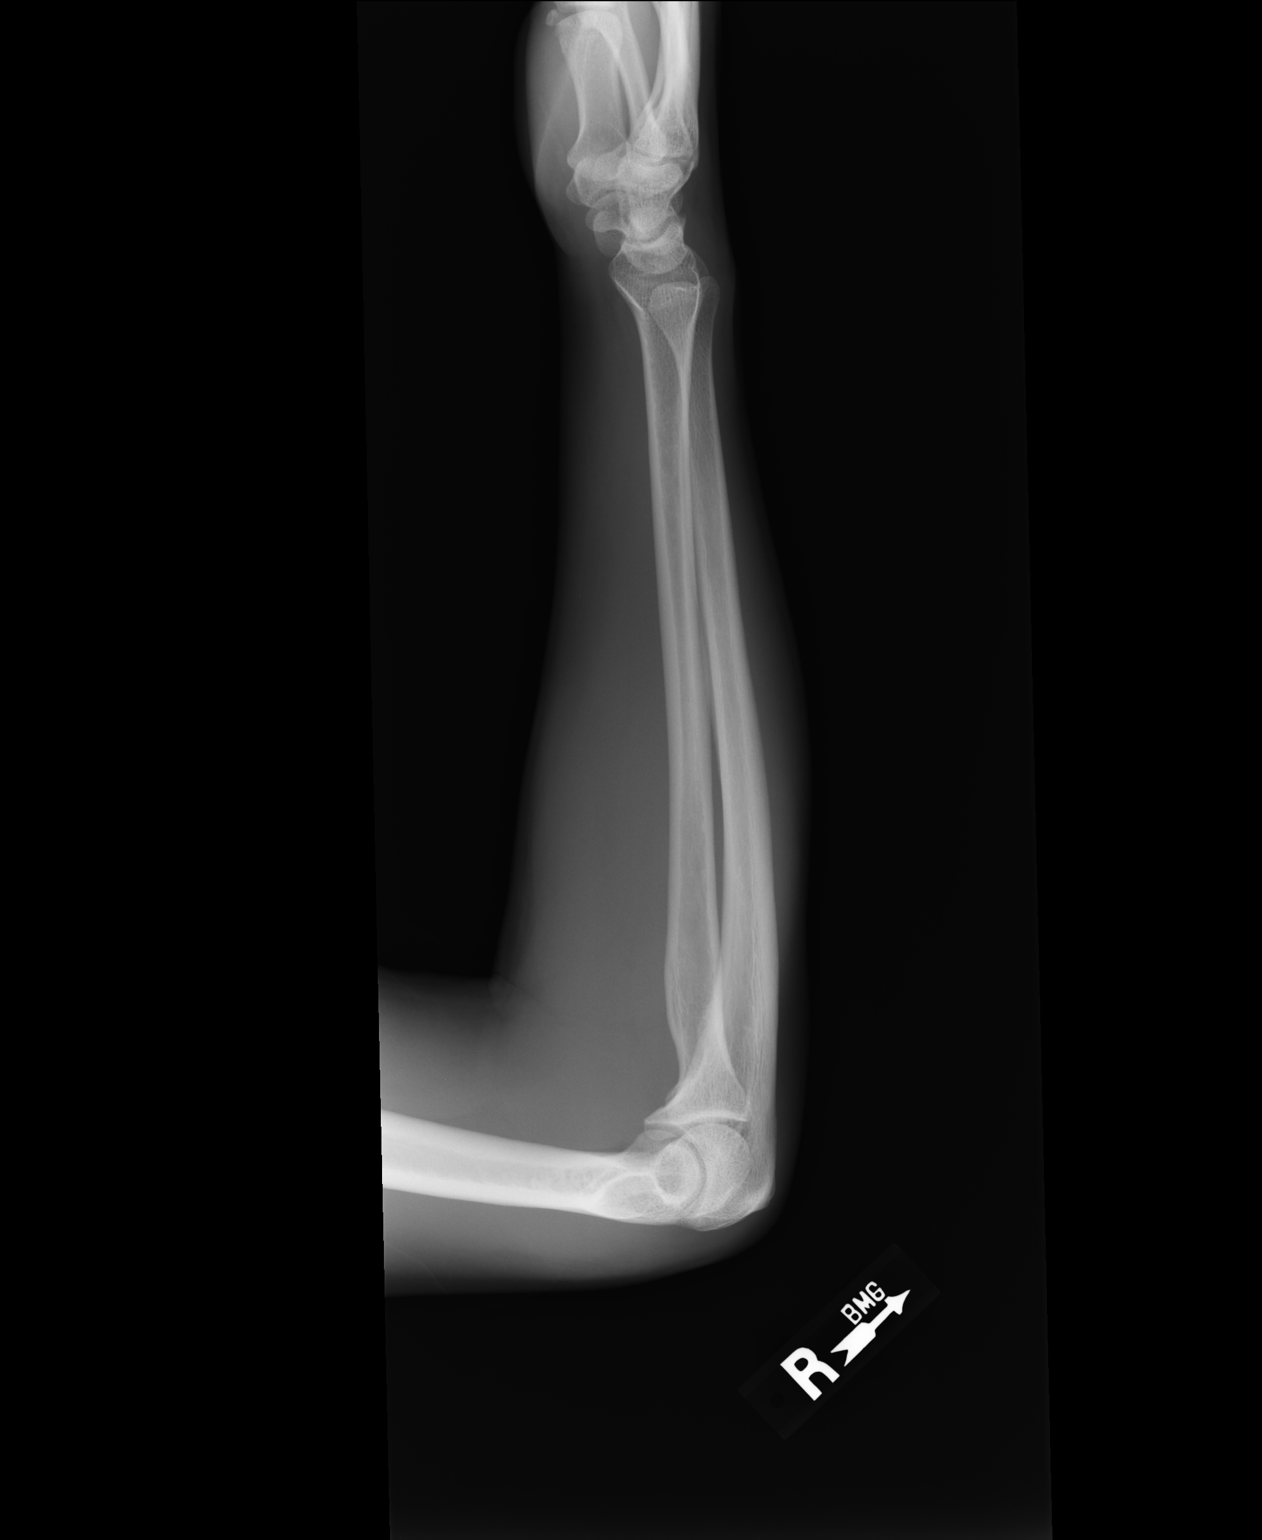

[2 of 2 positions shown; findings below may reference images not displayed]

FINDINGS: Imaged bones, joints and soft tissues appear normal.
IMPRESSION: Negative exam.

## 2012-04-08 MED ORDER — HYDROCODONE-ACETAMINOPHEN 5-325 MG PO TABS: 1.0000 | ORAL_TABLET | Freq: Four times a day (QID) | ORAL | Status: DC | PRN

## 2012-04-08 NOTE — Progress Notes (Signed)
Subjective:    Patient ID: Zachary Mata, male    DOB: 1980-07-23, 32 y.o.   MRN: [id number]  HPI Zachary Mata is a 32 y.o. male  R arm pain - dx with tricep tendonitis - [facility] about 2 years ago.  Felt similar then.  Treated with pain meds and referred to orthopaedic doctor.   Current sx's since yesterday evening - swinging maul to split wood - felt some soreness in back of R arm, then felt sharp pain with one swing in the elbow area.  Continued pain - up and down arm since.  Rare neck soreness.   Ibuprofen - 800mg  - tried 3 doses - no relief.   No regular orthopaedic doctor.   Seen in January for back pain, stretches at home.  No recent narcotic prescriptions, no other prescribing providers.  No fever,  No rash, no redness.   Builds bridges.  Tobacco - 1 and 1/2 ppd.   Review of Systems  Constitutional: Negative for fever and chills.  Musculoskeletal: Positive for myalgias and arthralgias. Negative for joint swelling.  Skin: Negative for rash and wound.  Neurological: Positive for weakness (with pain in R arm. ).       Objective:   Physical Exam  Constitutional: He is oriented to person, place, and time. He appears well-developed and well-nourished. No distress.  Musculoskeletal:       Right shoulder: He exhibits no bony tenderness.       Right elbow: He exhibits normal range of motion, no swelling, no effusion, no deformity and no laceration. Tenderness found. Radial head, medial epicondyle, lateral epicondyle and olecranon process tenderness noted.       Right wrist: He exhibits normal range of motion, no tenderness and no bony tenderness.       Cervical back: He exhibits normal range of motion.       Arms: Neurological: He is alert and oriented to person, place, and time.  Skin: Skin is warm and dry.       nvi distally.   [facility] reading (PRIMARY) by  Dr. Neva Seat: R elbow and forearm - no fracture identified.   Controlled substance database search: last  rx #15 of hydrocodone 5mg  on 10/08/11.     Assessment & Plan:  Zachary Mata is a 32 y.o. male Right elbow pain - Plan: DG Elbow Complete Right, HYDROcodone-acetaminophen (NORCO/VICODIN) 5-325 MG per tablet  Right forearm pain - Plan: DG Forearm Right, HYDROcodone-acetaminophen (NORCO/VICODIN) 5-325 MG per tablet  Tendonitis of elbow, right - Plan: HYDROcodone-acetaminophen (NORCO/VICODIN) 5-325 MG per tablet  Guarded exam, but hx suggests overuse tendonitis, no bony findings on XR. Trial of continued ibuprofen as below, short term lortab, and recheck in next few days if not improved/resolving. rtc and compartment precautions discussed.   Patient Instructions  Continue ibuprofen up to 800mg  every 8 hours as needed with food, and if needed - the hydrocodone the next few days.  Elevate as able, sling as needed, and gentle range of motion at elbow as tolerated.  Recheck if not improving in next few days, or to emergency room sooner if swelling in arm, increased numbness in fingers, or cold fingers.     Meds ordered this encounter  Medications  . HYDROcodone-acetaminophen (NORCO/VICODIN) 5-325 MG per tablet    Sig: Take 1 tablet by mouth every 6 (six) hours as needed for pain.    Dispense:  15 tablet    Refill:  0

## 2012-04-08 NOTE — Patient Instructions (Addendum)
Continue ibuprofen up to 800mg  every 8 hours as needed with food, and if needed - the hydrocodone the next few days.  Elevate as able, sling as needed, and gentle range of motion at elbow as tolerated.  Recheck if not improving in next few days, or to emergency room sooner if swelling in arm, increased numbness in fingers, or cold fingers.

## 2012-05-04 ENCOUNTER — Encounter (HOSPITAL_BASED_OUTPATIENT_CLINIC_OR_DEPARTMENT_OTHER): Payer: Self-pay | Admitting: *Deleted

## 2012-05-04 ENCOUNTER — Emergency Department (HOSPITAL_BASED_OUTPATIENT_CLINIC_OR_DEPARTMENT_OTHER)
Admission: EM | Admit: 2012-05-04 | Discharge: 2012-05-04 | Disposition: A | Discharge status: Home / Self Care | Payer: PRIVATE HEALTH INSURANCE | Attending: Emergency Medicine | Admitting: Emergency Medicine

## 2012-05-04 DIAGNOSIS — K0889 Other specified disorders of teeth and supporting structures: Secondary | ICD-10-CM

## 2012-05-04 DIAGNOSIS — Z79899 Other long term (current) drug therapy: Secondary | ICD-10-CM | POA: Insufficient documentation

## 2012-05-04 DIAGNOSIS — K029 Dental caries, unspecified: Secondary | ICD-10-CM | POA: Insufficient documentation

## 2012-05-04 DIAGNOSIS — K089 Disorder of teeth and supporting structures, unspecified: Secondary | ICD-10-CM | POA: Insufficient documentation

## 2012-05-04 DIAGNOSIS — F17200 Nicotine dependence, unspecified, uncomplicated: Secondary | ICD-10-CM | POA: Insufficient documentation

## 2012-05-04 DIAGNOSIS — I10 Essential (primary) hypertension: Secondary | ICD-10-CM | POA: Insufficient documentation

## 2012-05-04 DIAGNOSIS — G8929 Other chronic pain: Secondary | ICD-10-CM | POA: Insufficient documentation

## 2012-05-04 MED ORDER — HYDROCODONE-ACETAMINOPHEN 5-325 MG PO TABS: 2.0000 | ORAL_TABLET | Freq: Four times a day (QID) | ORAL | Status: DC | PRN

## 2012-05-04 MED ORDER — PENICILLIN V POTASSIUM 500 MG PO TABS: 500.0000 mg | ORAL_TABLET | Freq: Four times a day (QID) | ORAL | Status: AC

## 2012-05-04 NOTE — ED Notes (Signed)
Right upper and lower teeth pain for the last 24 hours.  Multiple areas of dental caries and broken teeth.

## 2012-05-04 NOTE — ED Provider Notes (Signed)
History     CSN: [id number]  Arrival date & time 05/04/12  6213   First MD Initiated Contact with Patient 05/04/12 1016      Chief Complaint  Patient presents with  . Dental Pain    (Consider location/radiation/quality/duration/timing/severity/associated sxs/prior treatment) Patient is a 32 y.o. male presenting with tooth pain.  Dental Pain  Pt presents to the ED complaining of dental pain on the R upper and lower molars since yesterday. Aching, moderate, no associated fever or drainage.   Past Medical History  Diagnosis Date  . Hypertension   . Chronic back pain 03/02/2012  . Essential hypertension 03/02/2012    Past Surgical History  Procedure Laterality Date  . Hand surgery      Family History  Problem Relation Age of Onset  . Breast cancer      History  Substance Use Topics  . Smoking status: Current Every Day Smoker -- 1.00 packs/day    Types: Cigarettes  . Smokeless tobacco: Not on file  . Alcohol Use: Yes     Comment: occassionally      Review of Systems All other systems reviewed and are negative except as noted in HPI.   Allergies  Sulfa antibiotics  Home Medications   Current Outpatient Rx  Name  Route  Sig  Dispense  Refill  . cyclobenzaprine (FLEXERIL) 5 MG tablet   Oral   Take 2 tablets (10 mg total) by mouth at bedtime. For back pain   45 tablet   1   . diclofenac (VOLTAREN) 50 MG EC tablet      Take one tablet every 8 hours only as needed for pain, take with small snack.   45 tablet   0   . HYDROcodone-acetaminophen (NORCO/VICODIN) 5-325 MG per tablet   Oral   Take 1 tablet by mouth every 6 (six) hours as needed for pain.   15 tablet   0   . ibuprofen (ADVIL,MOTRIN) 200 MG tablet   Oral   Take 800 mg by mouth every 8 (eight) hours as needed. For pain         . Naphazoline HCl (CLEAR EYES OP)   Ophthalmic   Apply 1 drop to eye daily as needed. For dry eyes           BP 156/86  Pulse 80  Temp(Src) 98.6 F (37 C)  (Oral)  Resp 16  Ht 5\' 6"  (1.676 m)  Wt 135 lb (61.236 kg)  BMI 21.8 kg/m2  SpO2 100%  Physical Exam  Constitutional: He is oriented to person, place, and time. He appears well-developed and well-nourished.  HENT:  Head: Normocephalic and atraumatic.  Multiple dental caries, no abscess or gingival swelling  Neck: Neck supple.  Pulmonary/Chest: Effort normal.  Lymphadenopathy:    He has no cervical adenopathy.  Neurological: He is alert and oriented to person, place, and time. No cranial nerve deficit.  Psychiatric: He has a normal mood and affect. His behavior is normal.    ED Course  Procedures (including critical care time)  Labs Reviewed - No data to display No results found.   1. Toothache       MDM  PCN, Norco, dentist follow up.        Sebron Mcmahill B. Bernette Mayers, MD 05/04/12 1022

## 2012-06-19 ENCOUNTER — Ambulatory Visit (INDEPENDENT_AMBULATORY_CARE_PROVIDER_SITE_OTHER): Payer: PRIVATE HEALTH INSURANCE | Admitting: Sports Medicine

## 2012-06-19 ENCOUNTER — Encounter: Payer: Self-pay | Admitting: Sports Medicine

## 2012-06-19 VITALS — BP 144/89 | HR 82 | Wt 134.0 lb

## 2012-06-19 DIAGNOSIS — Z299 Encounter for prophylactic measures, unspecified: Secondary | ICD-10-CM

## 2012-06-19 DIAGNOSIS — Z0000 Encounter for general adult medical examination without abnormal findings: Secondary | ICD-10-CM | POA: Insufficient documentation

## 2012-06-19 DIAGNOSIS — M549 Dorsalgia, unspecified: Secondary | ICD-10-CM

## 2012-06-19 DIAGNOSIS — G8929 Other chronic pain: Secondary | ICD-10-CM

## 2012-06-19 MED ORDER — TRAMADOL HCL 50 MG PO TABS: 50.0000 mg | ORAL_TABLET | Freq: Three times a day (TID) | ORAL | Status: DC | PRN

## 2012-06-19 MED ORDER — PREDNISONE 50 MG PO TABS: ORAL_TABLET | ORAL | Status: DC

## 2012-06-19 NOTE — Assessment & Plan Note (Signed)
Represented L5 versus an S1 radiculitis, left worse than right. Formal physical therapy, tramadol, Flexeril, prednisone. Return in 4 weeks. MRI for interventional injection planning is no better.

## 2012-06-19 NOTE — Assessment & Plan Note (Signed)
Checking routine bloodwork. 

## 2012-06-19 NOTE — Progress Notes (Signed)
  Subjective:    CC: Low back pain  HPI: This is a very pleasant 32 year old male comes in with approximately 5 year history of pain he localizes in the left side of his lumbar spine radiating up between the shoulder blades, and occasionally down both legs, left worse than right, down the posterior aspect of the thigh but not past the knee. It's worse with sitting, riding in a car, Valsalva. He denies any bowel or bladder dysfunction, or constitutional symptoms. 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 NSAIDs have been not really effective for him, Flexeril has been effective at night, and hydrocodone has been affected as well. He has never had physical therapy, steroids, or advanced imaging. Pain is moderate.  Preventive measure: He would also like some routine blood work checked, including blood work for diabetes  Past medical history, Surgical history, Family history not pertinant except as noted below, Social history, Allergies, and medications have been entered into the medical record, reviewed, and no changes needed.   Review of Systems: No fevers, chills, night sweats, weight loss, chest pain, or shortness of breath.   Objective:    General: Well Developed, well nourished, and in no acute distress.  Neuro: Alert and oriented x3, extra-ocular muscles intact, sensation grossly intact.  HEENT: Normocephalic, atraumatic, pupils equal round reactive to light, neck supple, no masses, no lymphadenopathy, thyroid nonpalpable.  Skin: Warm and dry, no rashes. Cardiac: Regular rate and rhythm, no murmurs rubs or gallops, no lower extremity edema.  Respiratory: Clear to auscultation bilaterally. Not using accessory muscles, speaking in full sentences. Back Exam:  Inspection: Unremarkable  Motion: Flexion 45 deg, Extension 45 deg, Side Bending to 45 deg bilaterally,  Rotation to 45 deg bilaterally  SLR laying:  Positive with reproduction of paresthesias into the bottom of his foot.  XSLR laying: Negative  Palpable tenderness: Left and right paralumbar muscles. FABER: negative. Sensory change: Gross sensation intact to all lumbar and sacral dermatomes.  Reflexes: 2+ at both patellar tendons, 2+ at achilles tendons, Babinski's downgoing.  Strength at foot  Plantar-flexion: 5/5 Dorsi-flexion: 5/5 Eversion: 5/5 Inversion: 5/5  Leg strength  Quad: 5/5 Hamstring: 5/5 Hip flexor: 5/5 Hip abductors: 5/5  Gait unremarkable.  Thoracic and lumbar spine x-rays were reviewed and are unremarkable.   Impression and Recommendations:

## 2012-06-23 LAB — CBC
HCT: 40.8 % (ref 39.0–52.0)
Hemoglobin: 14.5 g/dL (ref 13.0–17.0)
MCH: 34.4 pg — ABNORMAL HIGH (ref 26.0–34.0)
MCHC: 35.5 g/dL (ref 30.0–36.0)
MCV: 96.7 fL (ref 78.0–100.0)
Platelets: 220 10*3/uL (ref 150–400)
RBC: 4.22 MIL/uL (ref 4.22–5.81)
RDW: 13.2 % (ref 11.5–15.5)
WBC: 7.2 10*3/uL (ref 4.0–10.5)

## 2012-06-23 LAB — HEMOGLOBIN A1C
Hgb A1c MFr Bld: 5.3 % (ref <5.7)
Mean Plasma Glucose: 105 mg/dL (ref <117)

## 2012-06-24 LAB — COMPREHENSIVE METABOLIC PANEL
ALT: 35 U/L (ref 0–53)
AST: 29 U/L (ref 0–37)
Albumin: 4.5 g/dL (ref 3.5–5.2)
Alkaline Phosphatase: 56 U/L (ref 39–117)
BUN: 10 mg/dL (ref 6–23)
CO2: 26 mEq/L (ref 19–32)
Calcium: 9.2 mg/dL (ref 8.4–10.5)
Chloride: 101 mEq/L (ref 96–112)
Creat: 0.8 mg/dL (ref 0.50–1.35)
Glucose, Bld: 76 mg/dL (ref 70–99)
Potassium: 4.3 mEq/L (ref 3.5–5.3)
Sodium: 140 mEq/L (ref 135–145)
Total Bilirubin: 0.9 mg/dL (ref 0.3–1.2)
Total Protein: 7.4 g/dL (ref 6.0–8.3)

## 2012-06-24 LAB — LIPID PANEL
Cholesterol: 116 mg/dL (ref 0–200)
HDL: 43 mg/dL (ref >39)
LDL Cholesterol: 48 mg/dL (ref 0–99)
Total CHOL/HDL Ratio: 2.7 Ratio
Triglycerides: 124 mg/dL (ref <150)
VLDL: 25 mg/dL (ref 0–40)

## 2012-06-24 LAB — TSH: TSH: 3.382 u[IU]/mL (ref 0.350–4.500)

## 2012-06-24 LAB — VITAMIN D 25 HYDROXY (VIT D DEFICIENCY, FRACTURES): Vit D, 25-Hydroxy: 56 ng/mL (ref 30–89)

## 2012-06-24 LAB — TESTOSTERONE, FREE, TOTAL, SHBG
Sex Hormone Binding: 32 nmol/L (ref 13–71)
Testosterone, Free: 92.9 pg/mL (ref 47.0–244.0)
Testosterone-% Free: 2.1 % (ref 1.6–2.9)
Testosterone: 443 ng/dL (ref 300–890)

## 2012-07-15 ENCOUNTER — Emergency Department (HOSPITAL_BASED_OUTPATIENT_CLINIC_OR_DEPARTMENT_OTHER)
Admission: EM | Admit: 2012-07-15 | Discharge: 2012-07-15 | Disposition: A | Discharge status: Home / Self Care | Payer: PRIVATE HEALTH INSURANCE | Attending: Emergency Medicine | Admitting: Emergency Medicine

## 2012-07-15 ENCOUNTER — Encounter (HOSPITAL_BASED_OUTPATIENT_CLINIC_OR_DEPARTMENT_OTHER): Payer: Self-pay | Admitting: *Deleted

## 2012-07-15 DIAGNOSIS — I10 Essential (primary) hypertension: Secondary | ICD-10-CM | POA: Insufficient documentation

## 2012-07-15 DIAGNOSIS — K0889 Other specified disorders of teeth and supporting structures: Secondary | ICD-10-CM

## 2012-07-15 DIAGNOSIS — K089 Disorder of teeth and supporting structures, unspecified: Secondary | ICD-10-CM | POA: Insufficient documentation

## 2012-07-15 DIAGNOSIS — G8929 Other chronic pain: Secondary | ICD-10-CM | POA: Insufficient documentation

## 2012-07-15 DIAGNOSIS — F17200 Nicotine dependence, unspecified, uncomplicated: Secondary | ICD-10-CM | POA: Insufficient documentation

## 2012-07-15 MED ORDER — HYDROCODONE-ACETAMINOPHEN 5-325 MG PO TABS: 1.0000 | ORAL_TABLET | Freq: Four times a day (QID) | ORAL | Status: DC | PRN

## 2012-07-15 MED ORDER — HYDROCODONE-ACETAMINOPHEN 5-325 MG PO TABS
1.0000 | ORAL_TABLET | Freq: Once | ORAL | Status: AC
  Administered 2012-07-15: 1 via ORAL
  Filled 2012-07-15: qty 1

## 2012-07-15 MED ORDER — PENICILLIN V POTASSIUM 500 MG PO TABS: 500.0000 mg | ORAL_TABLET | Freq: Four times a day (QID) | ORAL | Status: AC

## 2012-07-15 NOTE — ED Notes (Addendum)
Pt c/o dental pain x 2 days pt states he is out of flexeril and tramadol

## 2012-07-15 NOTE — ED Provider Notes (Signed)
History     CSN: [id number]  Arrival date & time 07/15/12  2309   First MD Initiated Contact with Patient 07/15/12 2321      Chief Complaint  Patient presents with  . Dental Pain    (Consider location/radiation/quality/duration/timing/severity/associated sxs/prior treatment) HPI This is a 32 year old male with pain in his right upper lower molars. He was seen here in March for the same thing but never sought followup, stating he was told he couldn't be seen for 2 weeks. He states the pain is worsened over the past 2 days it is severe. He is having difficulty chewing on that side. It is noted that he was prescribed 50 tramadol tablets by his primary care physician on May 16 with 2 refills but he states he never got these filled because he doesn't have the money and they don't work anyway. He states he cannot get into [facility] for extraction subtotal after the weekend because he is waiting to get paid.  Past Medical History  Diagnosis Date  . Hypertension   . Chronic back pain 03/02/2012  . Essential hypertension 03/02/2012    Past Surgical History  Procedure Laterality Date  . Hand surgery      Family History  Problem Relation Age of Onset  . Breast cancer      History  Substance Use Topics  . Smoking status: Current Every Day Smoker -- 1.00 packs/day    Types: Cigarettes  . Smokeless tobacco: Not on file  . Alcohol Use: Yes     Comment: occassionally      Review of Systems  All other systems reviewed and are negative.    Allergies  Sulfa antibiotics  Home Medications   Current Outpatient Rx  Name  Route  Sig  Dispense  Refill  . Naphazoline HCl (CLEAR EYES OP)   Ophthalmic   Apply 1 drop to eye daily as needed. For dry eyes           BP 126/83  Pulse 84  Temp(Src) 98.9 F (37.2 C) (Oral)  Resp 16  Ht 5\' 5"  (1.651 m)  Wt 135 lb (61.236 kg)  BMI 22.47 kg/m2  SpO2 100%  Physical Exam General: Well-developed, well-nourished male in no  acute distress; appearance consistent with age of record; smells heavily of cigarette smoke HENT: normocephalic, atraumatic; widespread dental decay; carious right upper and lower molars with tenderness to percussion Eyes: pupils equal round and reactive to light; extraocular muscles intact Neck: supple; right anterior cervical lymphadenopathy Heart: regular rate and rhythm Lungs: clear to auscultation bilaterally Abdomen: soft; nondistended Extremities: No deformity; full range of motion Neurologic: Awake, alert and oriented; motor function intact in all extremities and symmetric; no facial droop Skin: Warm and dry    ED Course  Procedures (including critical care time)    MDM  Patient was advised to call the dentist on call as they have agreed to see ED referrals if contacted within 48 hours.        Hanley Seamen, MD 07/15/12 [phone]

## 2012-07-30 ENCOUNTER — Encounter: Payer: Self-pay | Admitting: *Deleted

## 2012-07-30 ENCOUNTER — Emergency Department
Admission: EM | Admit: 2012-07-30 | Discharge: 2012-07-30 | Disposition: A | Discharge status: Home / Self Care | Payer: PRIVATE HEALTH INSURANCE | Source: Home / Self Care | Attending: Family Medicine | Admitting: Family Medicine

## 2012-07-30 DIAGNOSIS — R42 Dizziness and giddiness: Secondary | ICD-10-CM

## 2012-07-30 NOTE — ED Provider Notes (Signed)
History    CSN: [id number] Arrival date & time 07/30/12  1351  First MD Initiated Contact with Patient 07/30/12 1410     Chief Complaint  Patient presents with  . Blood Pressure Check    Dizziness and blurred vision yesterday      HPI Comments: Patient reports that at about 2PM while at work yesterday, he began to feel dizzy ("off balance") with blurred vision.  The symptoms lasted about two hours and resolved after he returned home.  He admits that he works in a very hot environment and had not been drinking enough fluids.  He feels fine today, except that he has some hoarseness and post-nasal drainage. He is worried that his blood pressure may be high.  He has a past history of hypertension, having been treated in the past with lisinopril.  His blood pressure normalized and he has not taken lisinopril in about a year.  The history is provided by the patient.   Past Medical History  Diagnosis Date  . Hypertension   . Chronic back pain 03/02/2012  . Essential hypertension 03/02/2012   Past Surgical History  Procedure Laterality Date  . Hand surgery    . Wrist surgery     Family History  Problem Relation Age of Onset  . Breast cancer    . Cancer Mother     breats CA  . Diabetes Mother    History  Substance Use Topics  . Smoking status: Current Every Day Smoker -- 1.00 packs/day for 15 years    Types: Cigarettes  . Smokeless tobacco: Never Used  . Alcohol Use: Yes     Comment: occassionally    Review of Systems  Constitutional: Negative.   HENT: Positive for congestion, voice change and postnasal drip.   Eyes: Positive for visual disturbance.  Respiratory: Negative.   Cardiovascular: Negative.   Gastrointestinal: Negative.   Endocrine: Negative.   Genitourinary: Negative.   Musculoskeletal: Negative.   Skin: Negative.   Neurological: Positive for dizziness. Negative for speech difficulty, weakness, light-headedness and headaches.  All other systems reviewed and  are negative.    Allergies  Sulfa antibiotics  Home Medications   Current Outpatient Rx  Name  Route  Sig  Dispense  Refill  . HYDROcodone-acetaminophen (NORCO/VICODIN) 5-325 MG per tablet   Oral   Take 1-2 tablets by mouth every 6 (six) hours as needed for pain.   20 tablet   0   . Naphazoline HCl (CLEAR EYES OP)   Ophthalmic   Apply 1 drop to eye daily as needed. For dry eyes          BP 127/87  Pulse 90  Resp 16  Ht 5\' 6"  (1.676 m)  Wt 126 lb (57.153 kg)  BMI 20.35 kg/m2  SpO2 97% Physical Exam Nursing notes and Vital Signs reviewed. Appearance:  Patient appears healthy, stated age, and in no acute distress Eyes:  Pupils are equal, round, and reactive to light and accomodation.  Extraocular movement is intact.  Conjunctivae are not inflamed.  Fundi benign Ears:  Canals occluded with cerumen bilaterally.  Nose:  Normal turbinates.  No sinus tenderness.  Pharynx:  Normal Neck:  Supple.  No adenopathy  Lungs:  Clear to auscultation.  Breath sounds are equal.  Heart:  Regular rate and rhythm without murmurs, rubs, or gallops.  Abdomen:  Nontender without masses or hepatosplenomegaly.  Bowel sounds are present.  No CVA or flank tenderness.  Extremities:  No edema.  No  calf tenderness Skin:  No rash present.   ED Course  Procedures none   1. Dizziness and giddiness; blood pressure mildly elevated today.  Exam unremarkable otherwise.  With sinus congestion and hoarseness, he may be experiencing prodromal viral symptoms.    MDM   Drink plenty of fluids at work.  Check blood pressure at home if symptoms occur again. Followup with Family Doctor if not improved in one week.  Monitor blood pressure; follow-up with Family Doctor if BP remains elevated  Lattie Haw, MD 07/31/12 [phone]

## 2012-07-30 NOTE — ED Notes (Addendum)
Zachary Mata c/o experiencing and episode of blurry vision and dizziness x 2 hours yesterday. He has a hx of htn and was on Lisinopril 1 year ago. Today he is asymptomatic.

## 2012-09-28 ENCOUNTER — Other Ambulatory Visit: Payer: Self-pay | Admitting: *Deleted

## 2012-09-28 DIAGNOSIS — G8929 Other chronic pain: Secondary | ICD-10-CM

## 2012-09-28 MED ORDER — TRAMADOL HCL 50 MG PO TABS: 50.0000 mg | ORAL_TABLET | Freq: Three times a day (TID) | ORAL | Status: DC | PRN

## 2012-10-20 IMAGING — CR DG HAND COMPLETE 3+V*L*
3 series · 3 of 3 positions shown · non-contrast
Comparison: None.

CLINICAL DATA: Left hand trauma

LEFT HAND - COMPLETE 3+ VIEW

[PA]
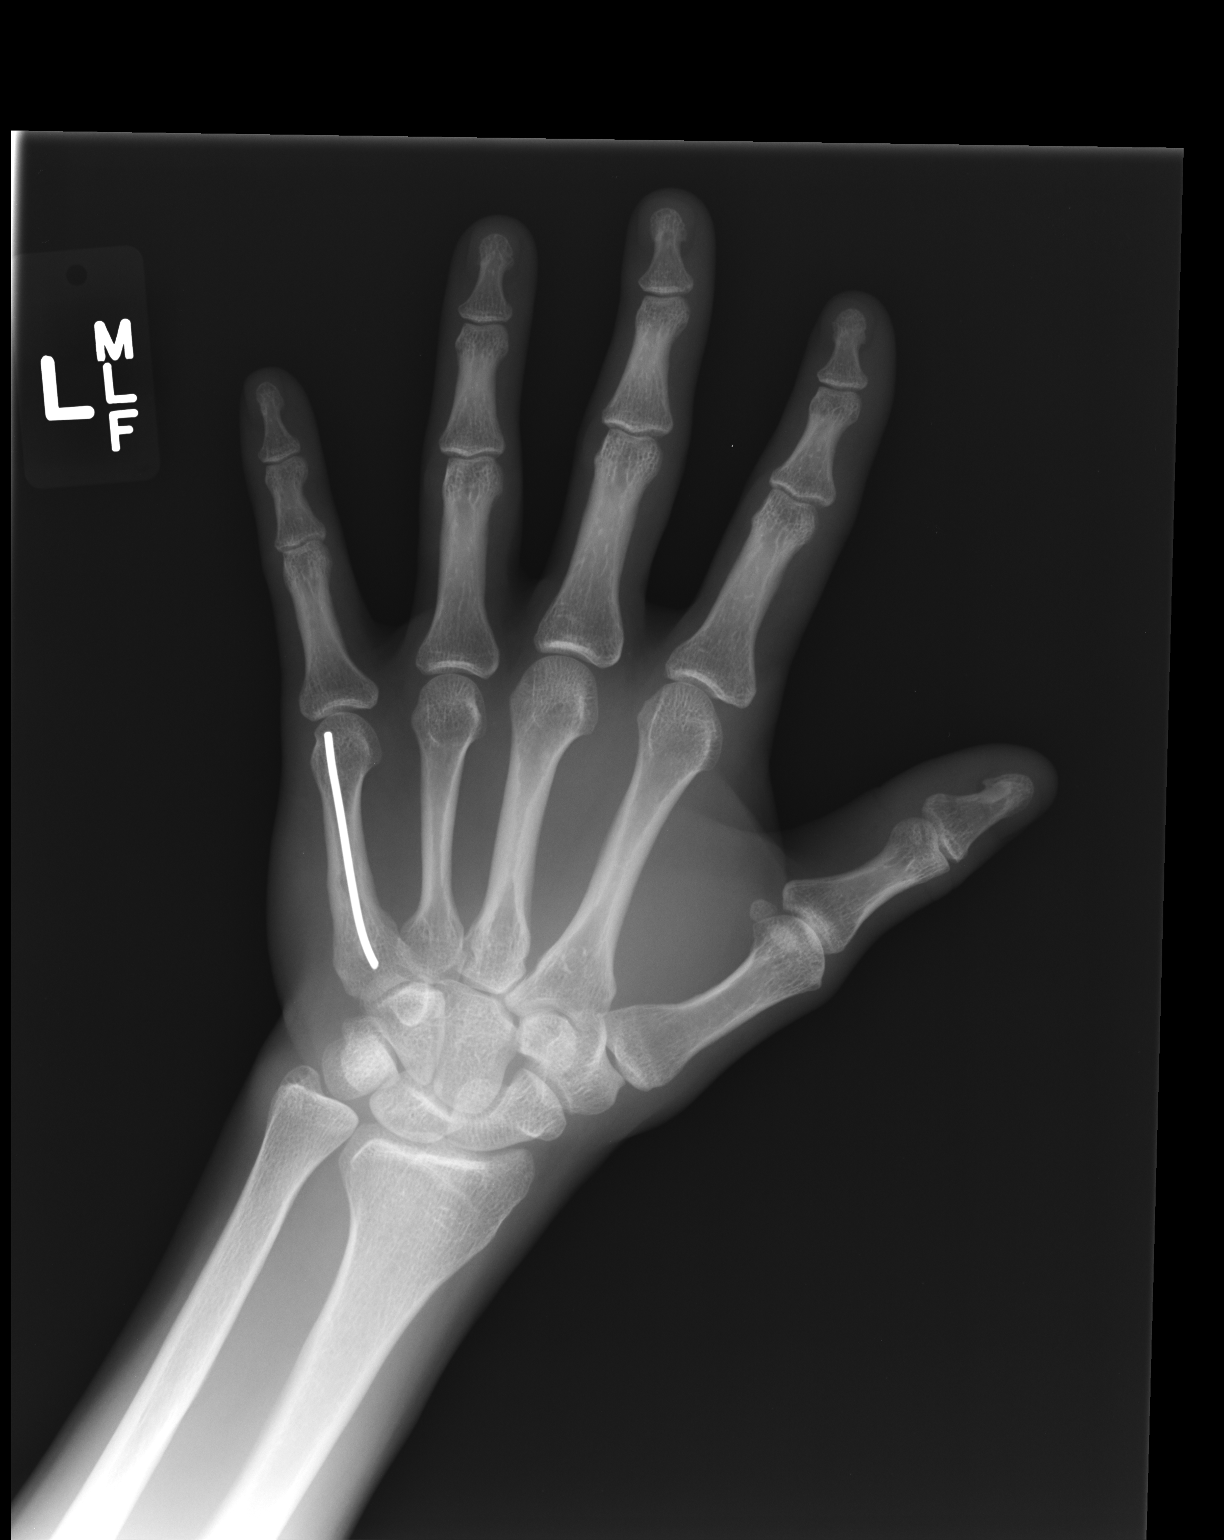

[pa obl]
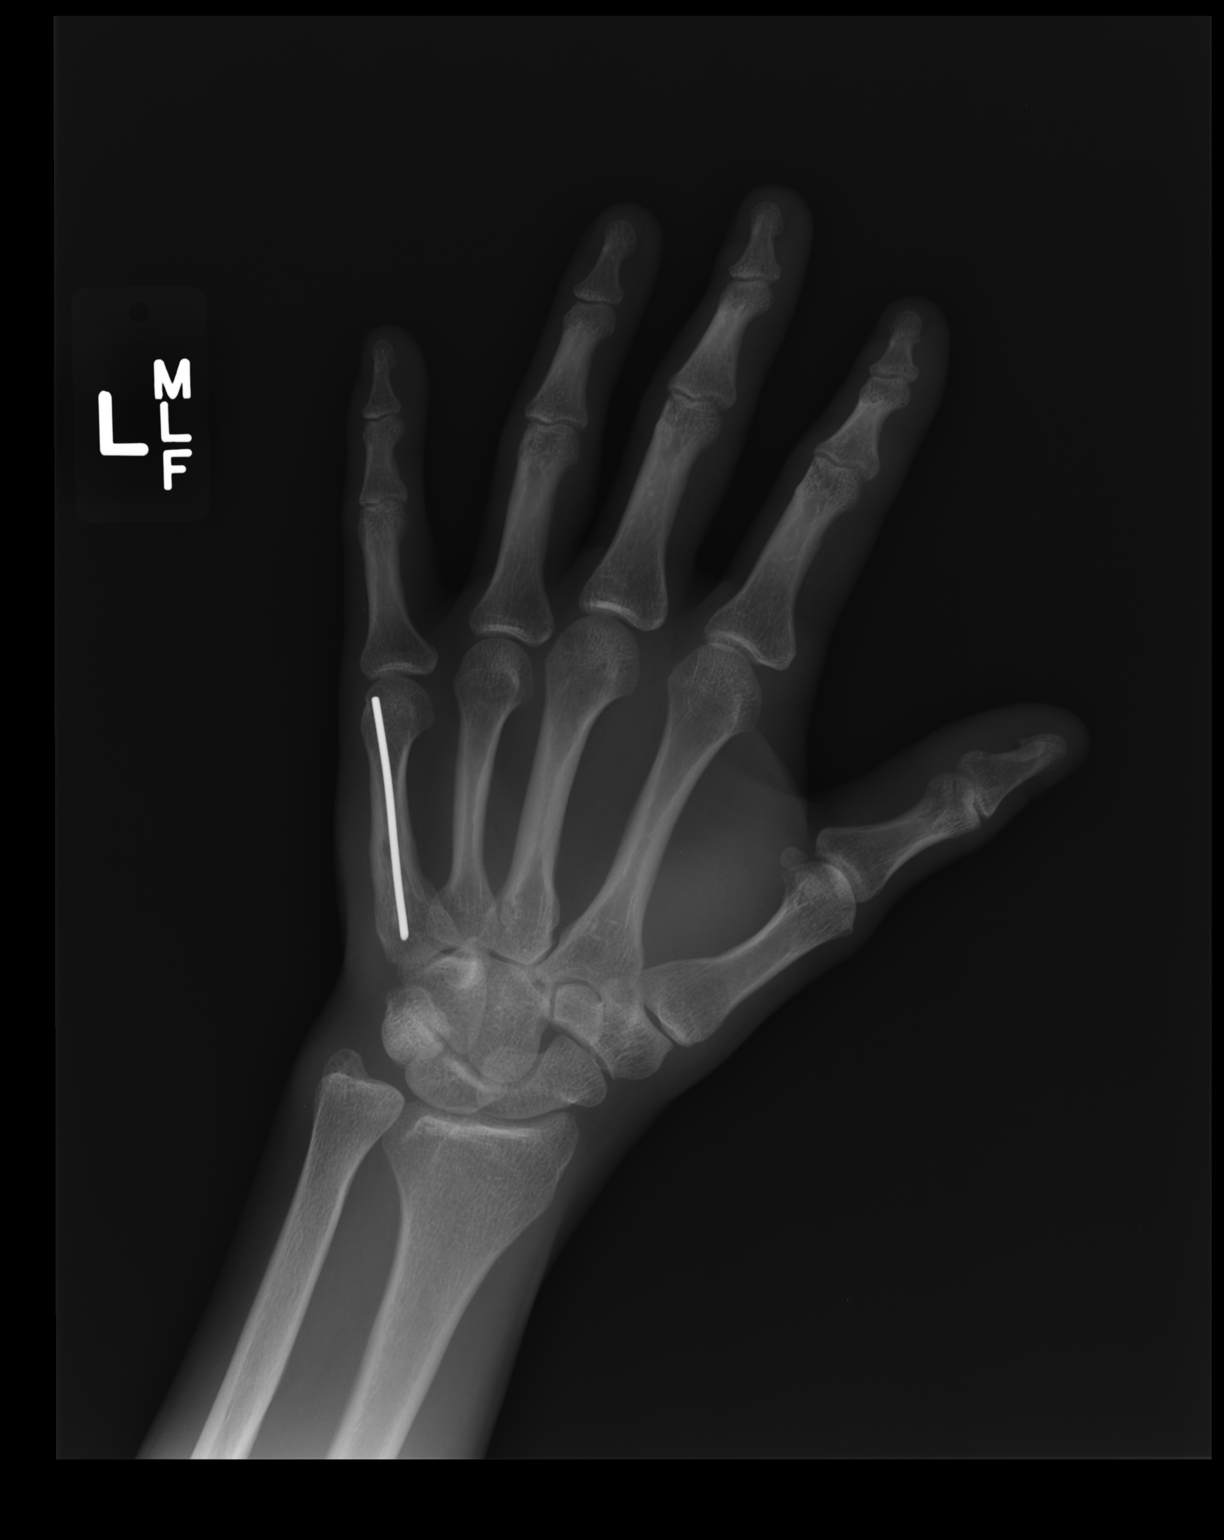

[lateral]
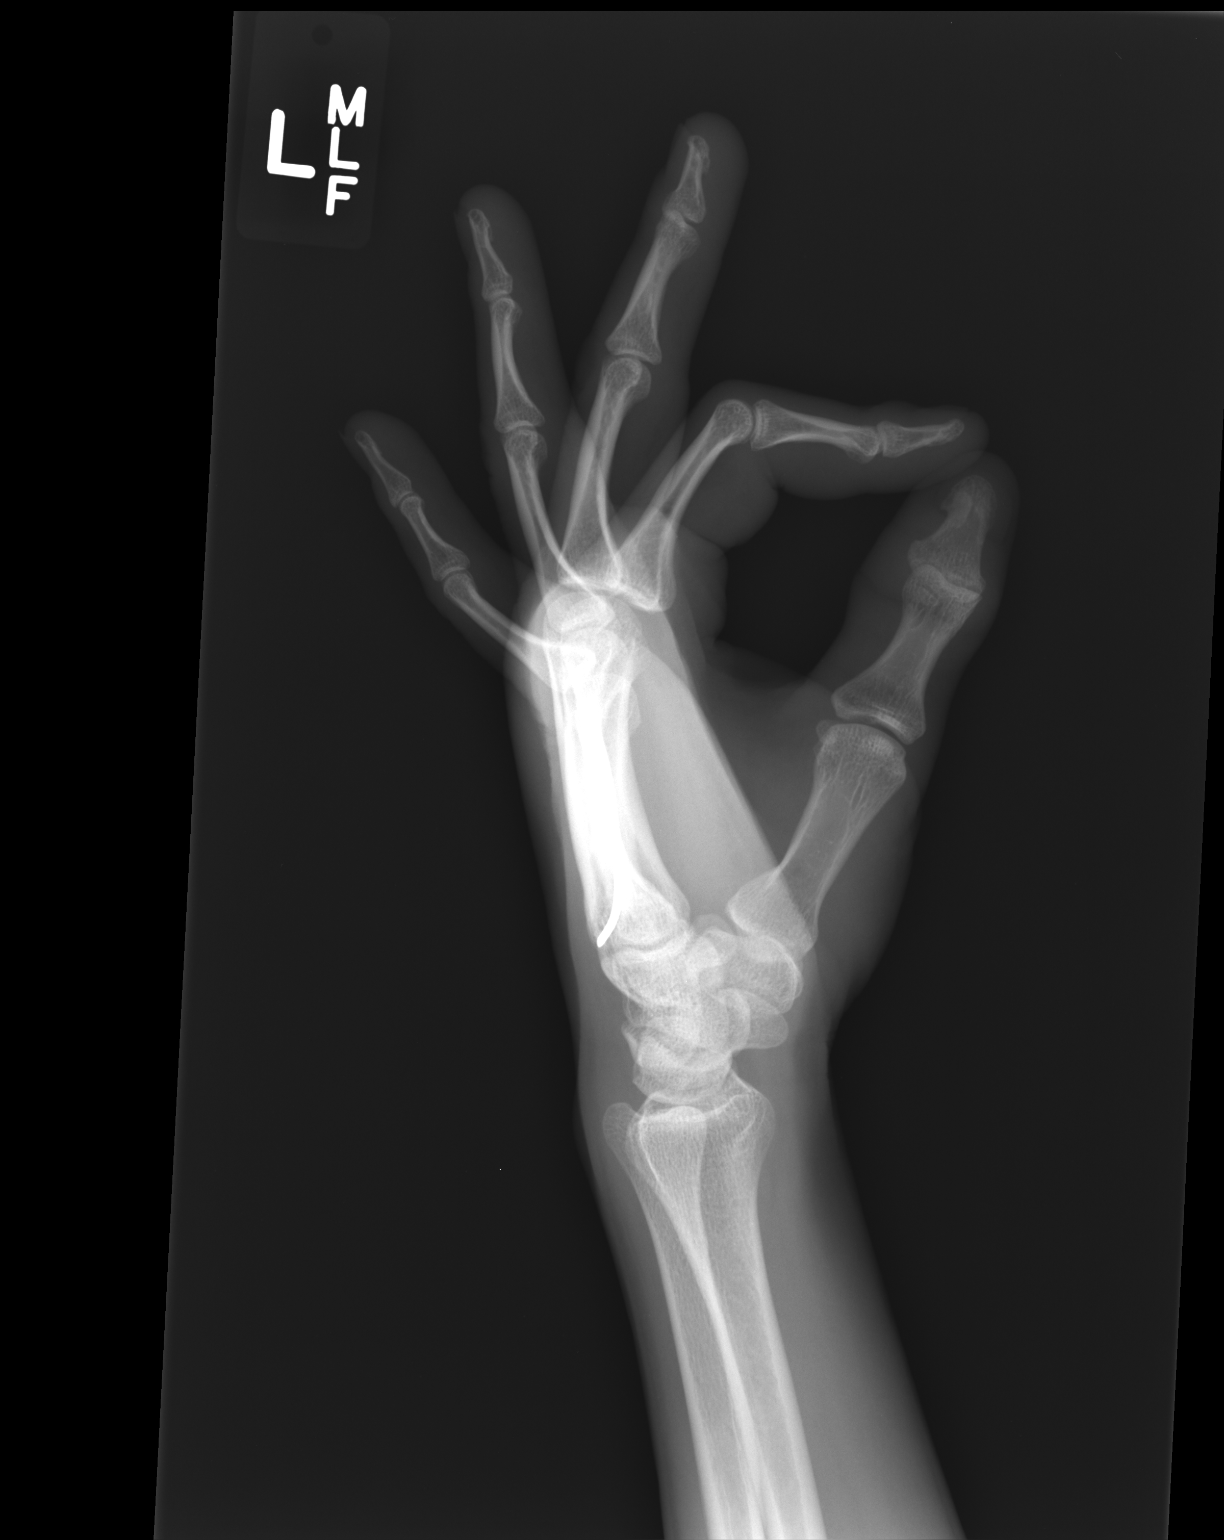

[3 of 3 positions shown; findings below may reference images not displayed]

FINDINGS: Fixation rod is noted in the fifth metacarpal consistent
with old fracture.  No acute fracture or dislocation is noted.
Joint spaces are intact.
IMPRESSION: No acute abnormality seen in the left hand.

## 2012-10-23 ENCOUNTER — Ambulatory Visit: Payer: PRIVATE HEALTH INSURANCE | Admitting: Sports Medicine

## 2012-10-23 DIAGNOSIS — Z0289 Encounter for other administrative examinations: Secondary | ICD-10-CM

## 2012-10-26 IMAGING — CR DG HAND COMPLETE 3+V*L*
2 series · 2 of 2 positions shown · non-contrast
Comparison: [DATE].

CLINICAL DATA: Left hand injury

LEFT HAND - COMPLETE 3+ VIEW

[PA]
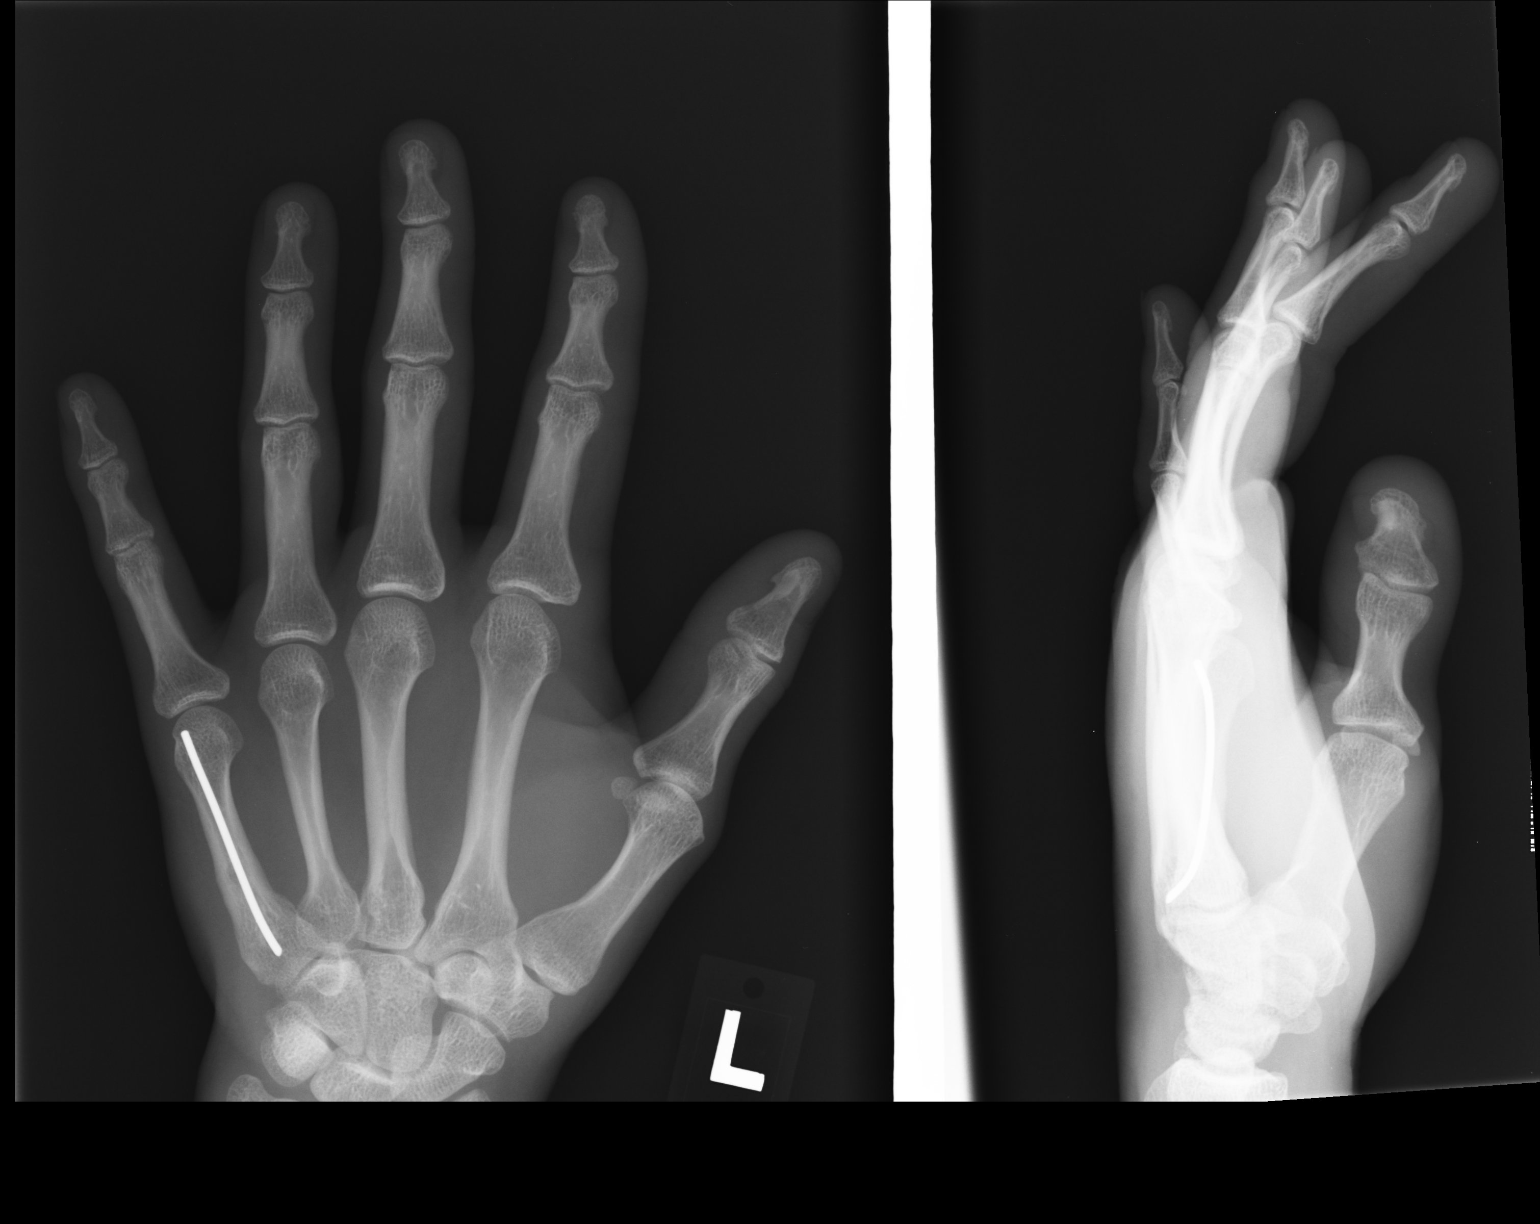

[pa obl]
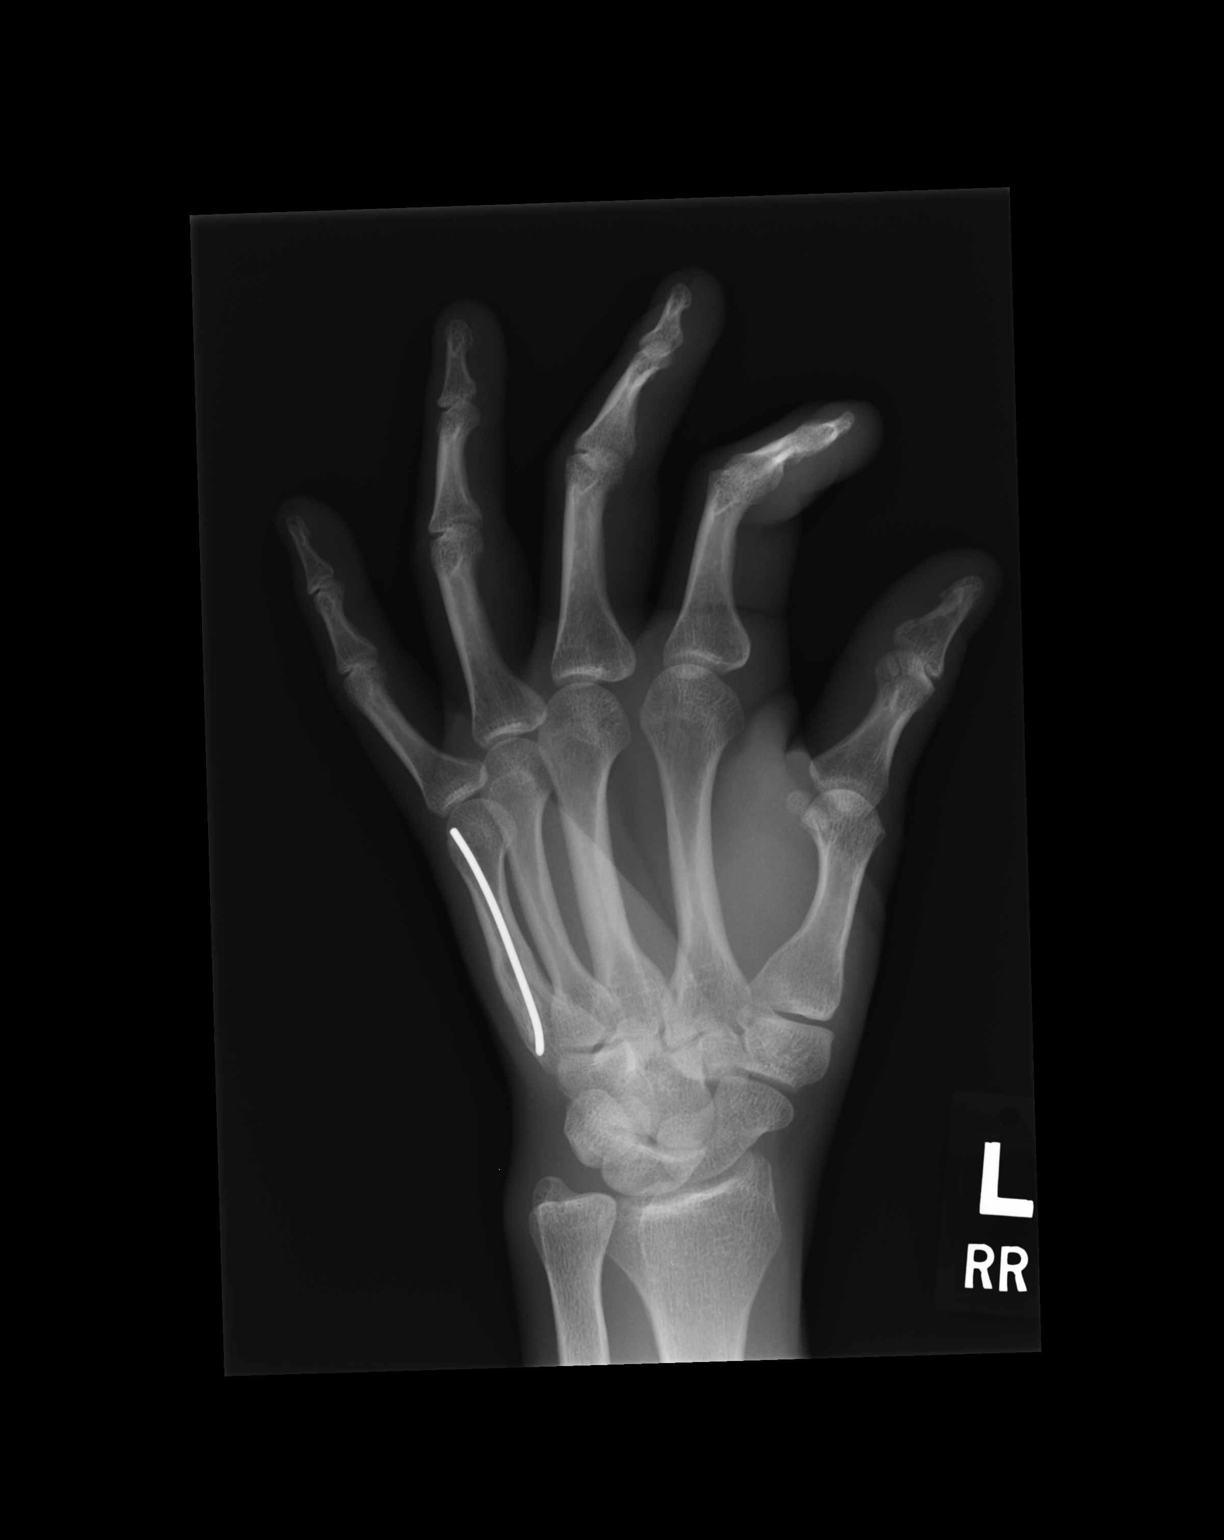

[2 of 2 positions shown; findings below may reference images not displayed]

FINDINGS: Status post rod fixation of fifth metacarpal which is
unchanged compared to prior exam.  No acute fracture or dislocation
is noted.  Joint spaces are intact.
IMPRESSION: No acute abnormality seen in the left hand.

## 2013-01-13 ENCOUNTER — Ambulatory Visit (INDEPENDENT_AMBULATORY_CARE_PROVIDER_SITE_OTHER): Payer: No Typology Code available for payment source | Admitting: Family Medicine

## 2013-01-13 VITALS — BP 142/88 | HR 90 | Temp 98.0°F | Resp 16 | Ht 67.0 in | Wt 128.0 lb

## 2013-01-13 DIAGNOSIS — K0490 Unspecified diseases of pulp and periapical tissues: Secondary | ICD-10-CM

## 2013-01-13 DIAGNOSIS — K0889 Other specified disorders of teeth and supporting structures: Secondary | ICD-10-CM

## 2013-01-13 DIAGNOSIS — K089 Disorder of teeth and supporting structures, unspecified: Secondary | ICD-10-CM

## 2013-01-13 DIAGNOSIS — K047 Periapical abscess without sinus: Secondary | ICD-10-CM

## 2013-01-13 MED ORDER — PENICILLIN V POTASSIUM 500 MG PO TABS: 500.0000 mg | ORAL_TABLET | Freq: Two times a day (BID) | ORAL | Status: DC

## 2013-01-13 MED ORDER — OXYCODONE-ACETAMINOPHEN 5-325 MG PO TABS: 1.0000 | ORAL_TABLET | Freq: Three times a day (TID) | ORAL | Status: DC | PRN

## 2013-01-13 NOTE — Progress Notes (Signed)
[facility] [address] [place] [address] [phone]  Date:  01/13/2013   Name:  Zachary Mata   DOB:  [birth year]   MRN:  [id number]  PCP:  Rodney Langton, MD    Chief Complaint: Abscess   History of Present Illness:  Zachary Mata is a 32 y.o. very pleasant male patient who presents with the following:  He has noted pain and swelling in his right jaw/ teeth for 2 days.  It is getting worse, he had to leave work today because he felt so gad.Marland Kitchen  He is seeing his DDS next week on Tuesday.    He has not noted a fever.   He is not able to eat very well due to pain He is generally otherwise healthy   Patient Active Problem List   Diagnosis Date Noted  . Preventive measure 06/19/2012  . Essential hypertension 03/02/2012  . Chronic back pain 03/02/2012    Past Medical History  Diagnosis Date  . Hypertension   . Chronic back pain 03/02/2012  . Essential hypertension 03/02/2012    Past Surgical History  Procedure Laterality Date  . Hand surgery    . Wrist surgery      History  Substance Use Topics  . Smoking status: Current Every Day Smoker -- 1.00 packs/day for 15 years    Types: Cigarettes  . Smokeless tobacco: Never Used  . Alcohol Use: Yes     Comment: occassionally    Family History  Problem Relation Age of Onset  . Breast cancer    . Cancer Mother     breats CA  . Diabetes Mother     Allergies  Allergen Reactions  . Sulfa Antibiotics Other (See Comments)    unknown    Medication list has been reviewed and updated.  Current Outpatient Prescriptions on File Prior to Visit  Medication Sig Dispense Refill  . HYDROcodone-acetaminophen (NORCO/VICODIN) 5-325 MG per tablet Take 1-2 tablets by mouth every 6 (six) hours as needed for pain.  20 tablet  0  . Naphazoline HCl (CLEAR EYES OP) Apply 1 drop to eye daily as needed. For dry eyes      . traMADol (ULTRAM) 50 MG tablet Take 1 tablet (50 mg total) by mouth every 8 (eight)  hours as needed for pain.  50 tablet  0   No current facility-administered medications on file prior to visit.    Review of Systems:  As per HPI- otherwise negative.   Physical Examination: Filed Vitals:   01/13/13 1404  BP: 142/88  Pulse: 90  Temp: 98 F (36.7 C)  Resp: 16   Filed Vitals:   01/13/13 1404  Height: 5\' 7"  (1.702 m)  Weight: 128 lb (58.06 kg)   Body mass index is 20.04 kg/(m^2). Ideal Body Weight: Weight in (lb) to have BMI = 25: 159.3  GEN: WDWN, NAD, Non-toxic, A & O x 3 HEENT: Atraumatic, Normocephalic. Neck supple. No masses, No LAD.  Bilateral TM wnl, oropharynx normal.  PEERL,EOMI.   He has tenderness and swelling of the cervical nodes on the right Poor dentition with multiple caries.  Mild swelling of right cheek Ears and Nose: No external deformity. CV: RRR, No M/G/R. No JVD. No thrill. No extra heart sounds. PULM: CTA B, no wheezes, crackles, rhonchi. No retractions. No resp. distress. No accessory muscle use. EXTR: No c/c/e NEURO Normal gait.  PSYCH: Normally interactive. Conversant. Not depressed or anxious appearing.  Calm demeanor.  Right mandibular tooth #31 or 32 is tender, and the buccal gum is quite swollen. Attempted needle I and D of gum but no pus produced.    Assessment and Plan: Tooth pain - Plan: oxyCODONE-acetaminophen (ROXICET) 5-325 MG per tablet  Abscessed tooth - Plan: penicillin v potassium (VEETID) 500 MG tablet  Treat for abscessed and painful tooth as above.   Give a small amount of viscous lidocaein in a cup to take home and apply to his gum as needed He will see his dentist next week See patient instructions for more details.     Signed Abbe Amsterdam, MD

## 2013-01-13 NOTE — Patient Instructions (Signed)
Use the penicillin as directed and the pain medicine as needed.  You can also put a bit of the lidocaine on your gum as needed.    Please be sure to see your dentist next week even if you feel better- this WILL come back

## 2013-02-03 ENCOUNTER — Telehealth: Payer: Self-pay

## 2013-02-03 ENCOUNTER — Emergency Department
Admission: EM | Admit: 2013-02-03 | Discharge: 2013-02-03 | Discharge status: Absent without leave | Payer: PRIVATE HEALTH INSURANCE | Source: Home / Self Care

## 2013-02-03 NOTE — Telephone Encounter (Signed)
Now that the dentist has removed the tooth the dentist has to be the one to call in medications.  We can not refill the pain medications I am sorry - he should call their after hours line and make a plan with the on call Dr.

## 2013-02-03 NOTE — Telephone Encounter (Signed)
Please advise. He states he talked to dentist and they can not see him until next week. Patient asking for renewal on Oxycodone please advise.

## 2013-02-03 NOTE — Telephone Encounter (Signed)
PT STATES HE HAD A TOOTH PULLED AND NOW FEEL LIKE HE HAS A DRY SOCKET, HIS PAIN IS UNBEARABLE AND WOULD LIKE TO HAVE SOME PAIN MEDICINE CALLED IN PLEASE CALL [phone]     [facility] IN [place]

## 2013-02-04 NOTE — Telephone Encounter (Signed)
Thank you I have called him to advise. Left message for him to call me back.

## 2013-02-08 NOTE — Telephone Encounter (Signed)
Patient has not returned my calls

## 2014-07-01 ENCOUNTER — Inpatient Hospital Stay (HOSPITAL_COMMUNITY)
Admission: EM | Admit: 2014-07-01 | Discharge: 2014-07-04 | DRG: 897 | Disposition: A | Discharge status: Home / Self Care | Payer: Self-pay | Attending: Internal Medicine | Admitting: Internal Medicine

## 2014-07-01 ENCOUNTER — Emergency Department (HOSPITAL_COMMUNITY): Payer: Self-pay

## 2014-07-01 ENCOUNTER — Encounter (HOSPITAL_COMMUNITY): Payer: Self-pay | Admitting: Emergency Medicine

## 2014-07-01 ENCOUNTER — Other Ambulatory Visit (HOSPITAL_COMMUNITY): Payer: Self-pay

## 2014-07-01 DIAGNOSIS — F489 Nonpsychotic mental disorder, unspecified: Secondary | ICD-10-CM

## 2014-07-01 DIAGNOSIS — G8929 Other chronic pain: Secondary | ICD-10-CM | POA: Diagnosis present

## 2014-07-01 DIAGNOSIS — F19939 Other psychoactive substance use, unspecified with withdrawal, unspecified: Secondary | ICD-10-CM | POA: Diagnosis present

## 2014-07-01 DIAGNOSIS — W19XXXA Unspecified fall, initial encounter: Secondary | ICD-10-CM

## 2014-07-01 DIAGNOSIS — Z882 Allergy status to sulfonamides status: Secondary | ICD-10-CM

## 2014-07-01 DIAGNOSIS — D72829 Elevated white blood cell count, unspecified: Secondary | ICD-10-CM | POA: Diagnosis present

## 2014-07-01 DIAGNOSIS — F10931 Alcohol use, unspecified with withdrawal delirium: Secondary | ICD-10-CM | POA: Insufficient documentation

## 2014-07-01 DIAGNOSIS — F1010 Alcohol abuse, uncomplicated: Secondary | ICD-10-CM | POA: Diagnosis present

## 2014-07-01 DIAGNOSIS — I10 Essential (primary) hypertension: Secondary | ICD-10-CM | POA: Diagnosis present

## 2014-07-01 DIAGNOSIS — R462 Strange and inexplicable behavior: Secondary | ICD-10-CM

## 2014-07-01 DIAGNOSIS — M549 Dorsalgia, unspecified: Secondary | ICD-10-CM | POA: Diagnosis present

## 2014-07-01 DIAGNOSIS — F17210 Nicotine dependence, cigarettes, uncomplicated: Secondary | ICD-10-CM | POA: Diagnosis present

## 2014-07-01 DIAGNOSIS — F10231 Alcohol dependence with withdrawal delirium: Principal | ICD-10-CM | POA: Diagnosis present

## 2014-07-01 DIAGNOSIS — G43909 Migraine, unspecified, not intractable, without status migrainosus: Secondary | ICD-10-CM | POA: Diagnosis present

## 2014-07-01 DIAGNOSIS — F19239 Other psychoactive substance dependence with withdrawal, unspecified: Secondary | ICD-10-CM | POA: Diagnosis present

## 2014-07-01 DIAGNOSIS — F13239 Sedative, hypnotic or anxiolytic dependence with withdrawal, unspecified: Secondary | ICD-10-CM | POA: Diagnosis not present

## 2014-07-01 LAB — BASIC METABOLIC PANEL
Anion gap: 12 (ref 5–15)
BUN: 9 mg/dL (ref 6–20)
CO2: 22 mmol/L (ref 22–32)
Calcium: 9.4 mg/dL (ref 8.9–10.3)
Chloride: 101 mmol/L (ref 101–111)
Creatinine, Ser: 0.75 mg/dL (ref 0.61–1.24)
GFR calc Af Amer: >60 mL/min (ref >60)
GFR calc non Af Amer: >60 mL/min (ref >60)
Glucose, Bld: 98 mg/dL (ref 65–99)
Potassium: 3.8 mmol/L (ref 3.5–5.1)
Sodium: 135 mmol/L (ref 135–145)

## 2014-07-01 LAB — CBC
HCT: 45 % (ref 39.0–52.0)
Hemoglobin: 16.2 g/dL (ref 13.0–17.0)
MCH: 35.6 pg — ABNORMAL HIGH (ref 26.0–34.0)
MCHC: 36 g/dL (ref 30.0–36.0)
MCV: 98.9 fL (ref 78.0–100.0)
Platelets: 193 10*3/uL (ref 150–400)
RBC: 4.55 MIL/uL (ref 4.22–5.81)
RDW: 12.2 % (ref 11.5–15.5)
WBC: 8.2 10*3/uL (ref 4.0–10.5)

## 2014-07-01 LAB — CBC WITH DIFFERENTIAL/PLATELET
Basophils Absolute: 0 10*3/uL (ref 0.0–0.1)
Basophils Relative: 0 % (ref 0–1)
Eosinophils Absolute: 0 10*3/uL (ref 0.0–0.7)
Eosinophils Relative: 0 % (ref 0–5)
HCT: 41.9 % (ref 39.0–52.0)
Hemoglobin: 15.2 g/dL (ref 13.0–17.0)
Lymphocytes Relative: 7 % — ABNORMAL LOW (ref 12–46)
Lymphs Abs: 1.2 10*3/uL (ref 0.7–4.0)
MCH: 36 pg — ABNORMAL HIGH (ref 26.0–34.0)
MCHC: 36.3 g/dL — ABNORMAL HIGH (ref 30.0–36.0)
MCV: 99.3 fL (ref 78.0–100.0)
Monocytes Absolute: 1.6 10*3/uL — ABNORMAL HIGH (ref 0.1–1.0)
Monocytes Relative: 10 % (ref 3–12)
Neutro Abs: 13.7 10*3/uL — ABNORMAL HIGH (ref 1.7–7.7)
Neutrophils Relative %: 83 % — ABNORMAL HIGH (ref 43–77)
Platelets: 188 10*3/uL (ref 150–400)
RBC: 4.22 MIL/uL (ref 4.22–5.81)
RDW: 12.2 % (ref 11.5–15.5)
WBC: 16.5 10*3/uL — ABNORMAL HIGH (ref 4.0–10.5)

## 2014-07-01 LAB — URINALYSIS, ROUTINE W REFLEX MICROSCOPIC
Bilirubin Urine: NEGATIVE
Glucose, UA: NEGATIVE mg/dL
Glucose, UA: NEGATIVE mg/dL
Hgb urine dipstick: NEGATIVE
Hgb urine dipstick: NEGATIVE
Ketones, ur: 15 mg/dL — AB
Ketones, ur: 15 mg/dL — AB
Leukocytes, UA: NEGATIVE
Nitrite: NEGATIVE
Nitrite: NEGATIVE
Protein, ur: NEGATIVE mg/dL
Protein, ur: NEGATIVE mg/dL
Specific Gravity, Urine: 1.012 (ref 1.005–1.030)
Specific Gravity, Urine: 1.021 (ref 1.005–1.030)
Urobilinogen, UA: 1 mg/dL (ref 0.0–1.0)
Urobilinogen, UA: 1 mg/dL (ref 0.0–1.0)
pH: 5 (ref 5.0–8.0)
pH: 5 (ref 5.0–8.0)

## 2014-07-01 LAB — CK: Total CK: 2451 U/L — ABNORMAL HIGH (ref 49–397)

## 2014-07-01 LAB — COMPREHENSIVE METABOLIC PANEL
ALT: 66 U/L — ABNORMAL HIGH (ref 17–63)
AST: 69 U/L — ABNORMAL HIGH (ref 15–41)
Albumin: 4.5 g/dL (ref 3.5–5.0)
Alkaline Phosphatase: 66 U/L (ref 38–126)
Anion gap: 13 (ref 5–15)
BUN: 9 mg/dL (ref 6–20)
CO2: 21 mmol/L — ABNORMAL LOW (ref 22–32)
Calcium: 9 mg/dL (ref 8.9–10.3)
Chloride: 105 mmol/L (ref 101–111)
Creatinine, Ser: 0.92 mg/dL (ref 0.61–1.24)
GFR calc Af Amer: >60 mL/min (ref >60)
GFR calc non Af Amer: >60 mL/min (ref >60)
Glucose, Bld: 81 mg/dL (ref 65–99)
Potassium: 4 mmol/L (ref 3.5–5.1)
Sodium: 139 mmol/L (ref 135–145)
Total Bilirubin: 2.2 mg/dL — ABNORMAL HIGH (ref 0.3–1.2)
Total Protein: 7.6 g/dL (ref 6.5–8.1)

## 2014-07-01 LAB — PHOSPHORUS: Phosphorus: 3.7 mg/dL (ref 2.5–4.6)

## 2014-07-01 LAB — RAPID URINE DRUG SCREEN, HOSP PERFORMED
Amphetamines: NOT DETECTED
Barbiturates: NOT DETECTED
Benzodiazepines: POSITIVE — AB
Cocaine: NOT DETECTED
Opiates: NOT DETECTED
Tetrahydrocannabinol: NOT DETECTED

## 2014-07-01 LAB — SALICYLATE LEVEL: Salicylate Lvl: <4 mg/dL (ref 2.8–30.0)

## 2014-07-01 LAB — ETHANOL: Alcohol, Ethyl (B): <5 mg/dL (ref <5)

## 2014-07-01 LAB — URINE MICROSCOPIC-ADD ON

## 2014-07-01 LAB — LIPASE, BLOOD: Lipase: 24 U/L (ref 22–51)

## 2014-07-01 LAB — GLUCOSE, CAPILLARY: Glucose-Capillary: 103 mg/dL — ABNORMAL HIGH (ref 65–99)

## 2014-07-01 LAB — ACETAMINOPHEN LEVEL: Acetaminophen (Tylenol), Serum: <10 ug/mL — ABNORMAL LOW (ref 10–30)

## 2014-07-01 LAB — I-STAT TROPONIN, ED: Troponin i, poc: 0 ng/mL (ref 0.00–0.08)

## 2014-07-01 LAB — MAGNESIUM: Magnesium: 1.9 mg/dL (ref 1.7–2.4)

## 2014-07-01 IMAGING — CT CT HEAD W/O CM
1 series · 16 of 30 positions shown, 20 images · non-contrast
Comparison: [DATE]

CLINICAL DATA: Patient tripped and fell, hitting right side of head

EXAM:
CT HEAD WITHOUT CONTRAST
TECHNIQUE: Contiguous axial images were obtained from the base of the skull
through the vertex without intravenous contrast.

[Series 2: head 5.0 h30s · axial · 0.45mm/px · z∈[-212,-72]mm · 16 of 32 slices shown, 20 images]
[im 2/32  brain]
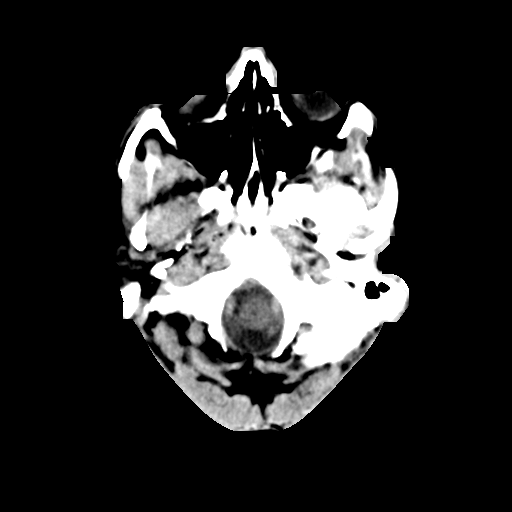
[im 2/32  bone]
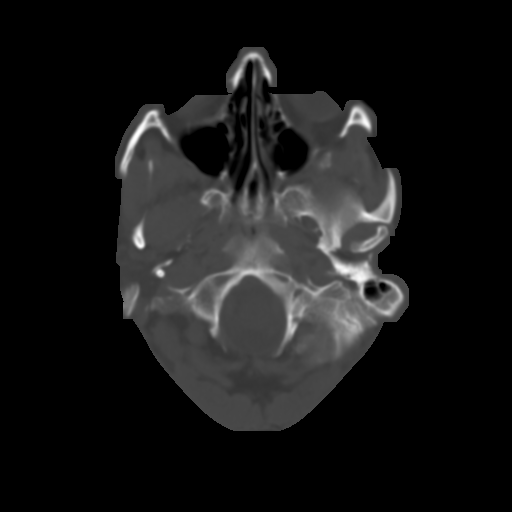
[im 4/32  brain]
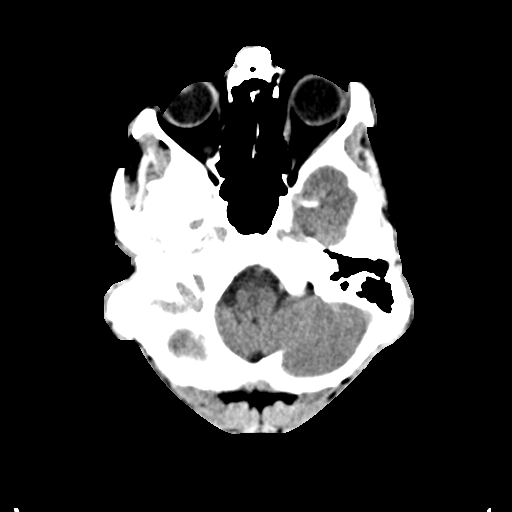
[im 6/32  brain]
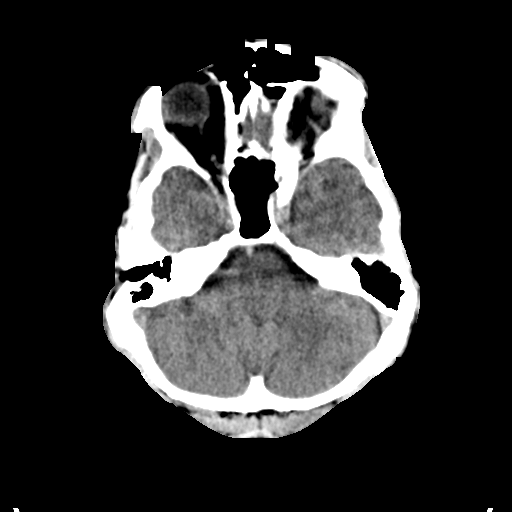
[im 8/32  brain]
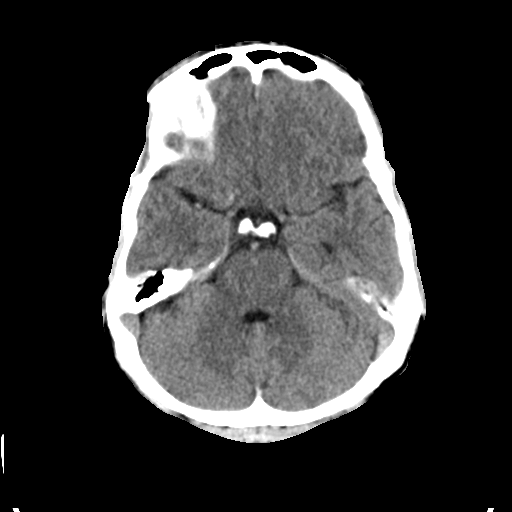
[im 9/32  brain]
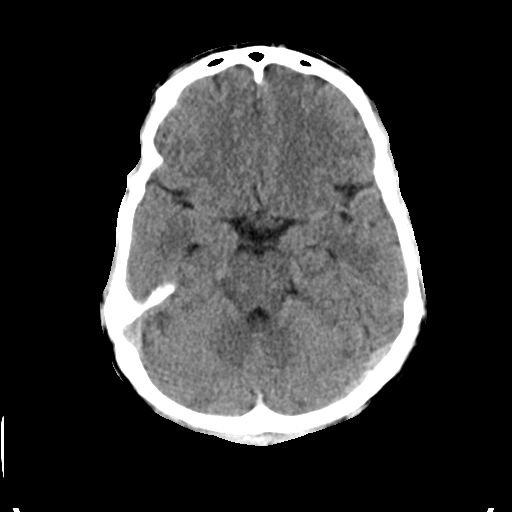
[im 9/32  bone]
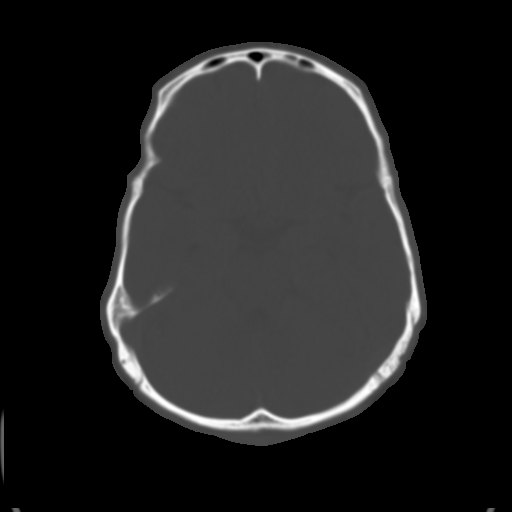
[im 11/32  brain]
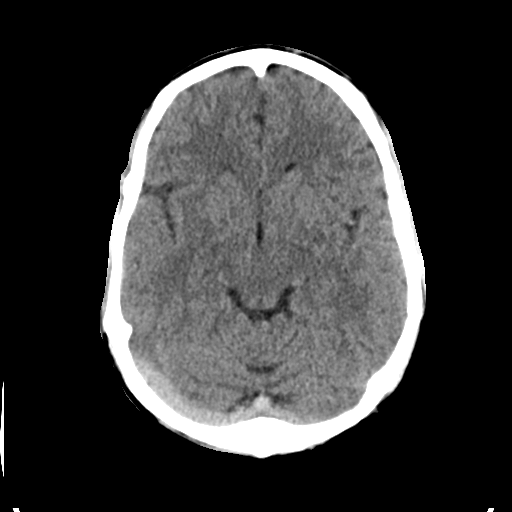
[im 13/32  brain]
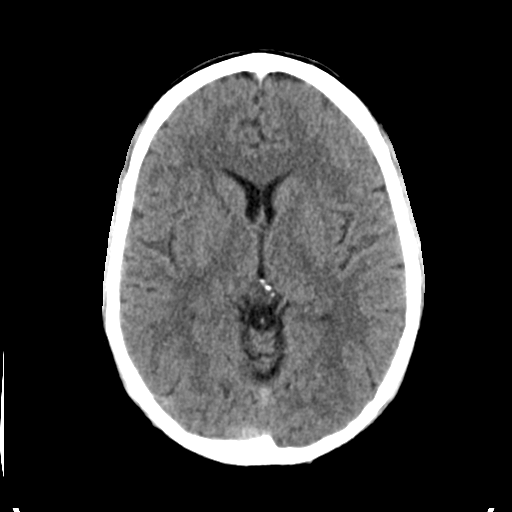
[im 15/32  brain]
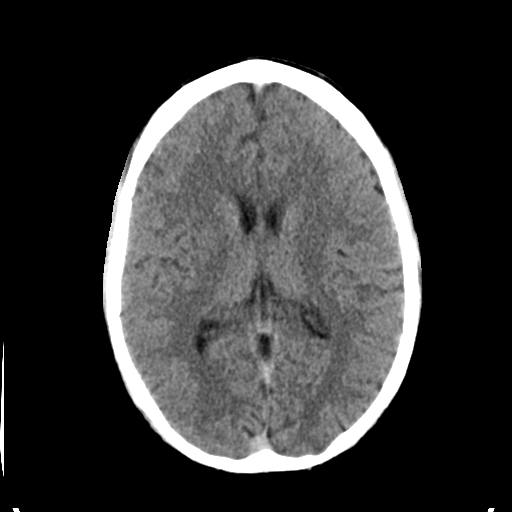
[im 17/32  brain]
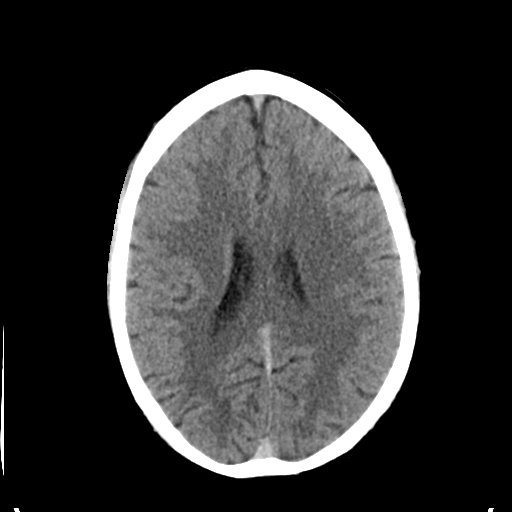
[im 17/32  bone]
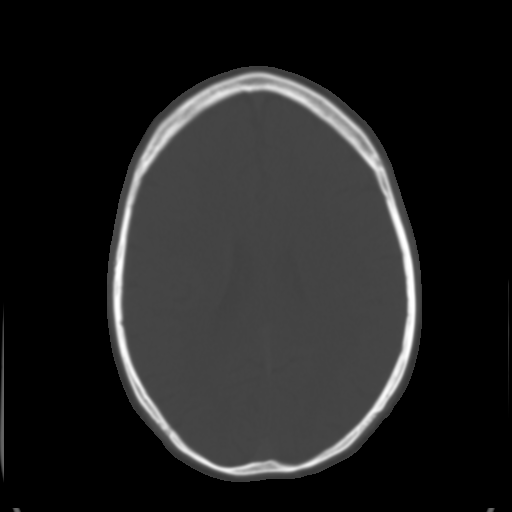
[im 19/32  brain]
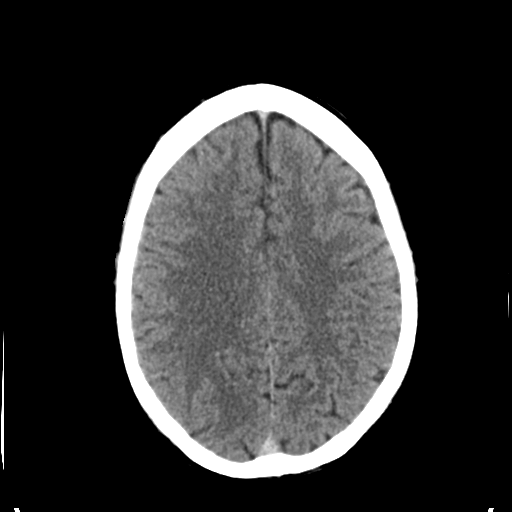
[im 21/32  brain]
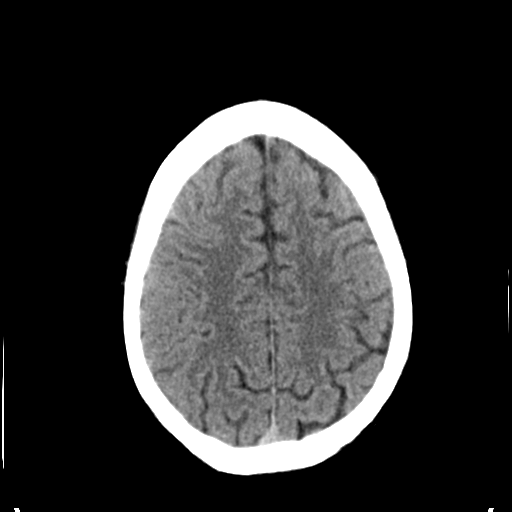
[im 23/32  brain]
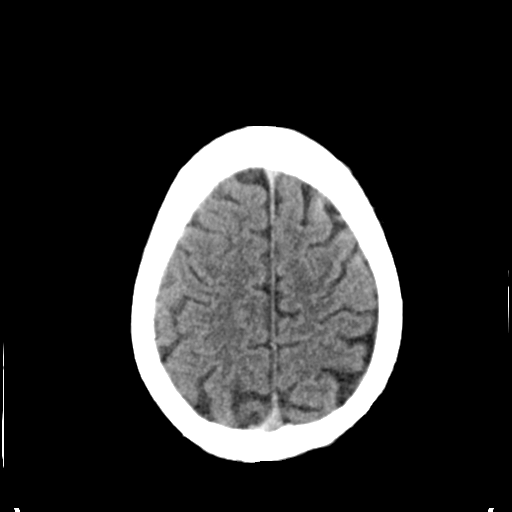
[im 24/32  brain]
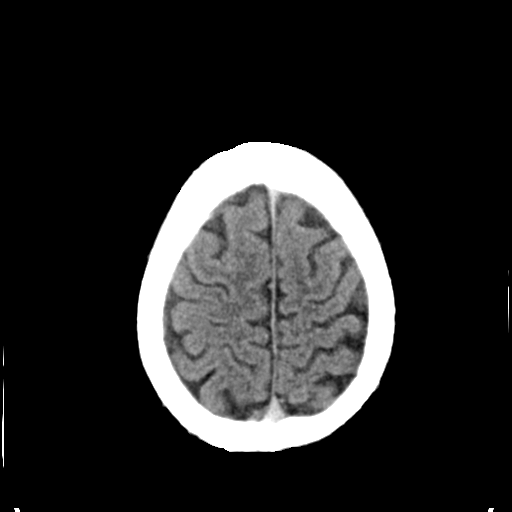
[im 24/32  bone]
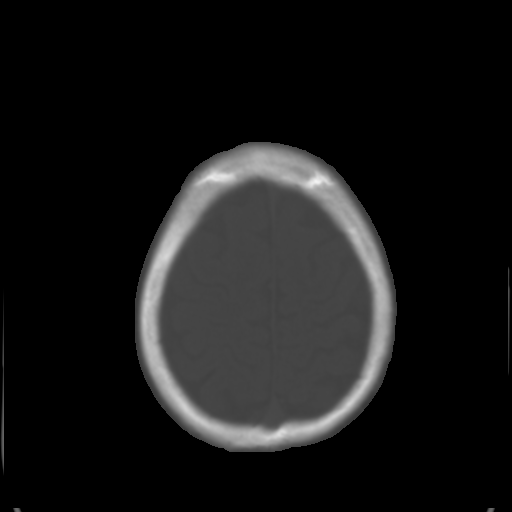
[im 26/32  brain]
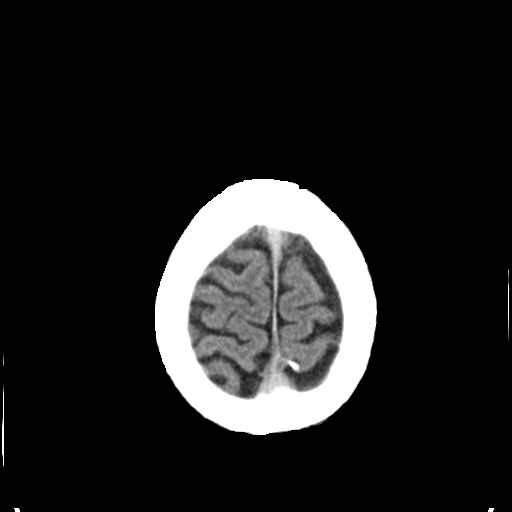
[im 28/32  brain]
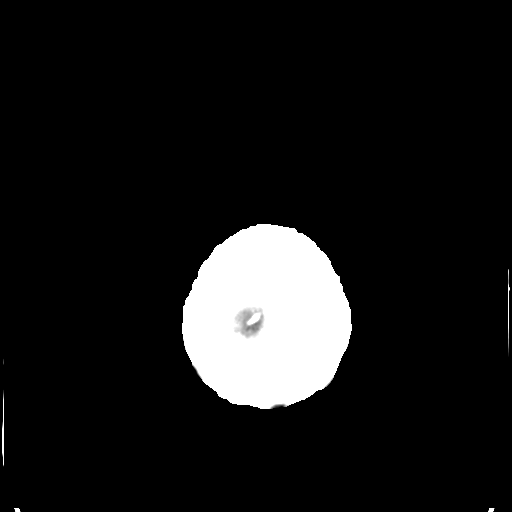
[im 30/32  brain]
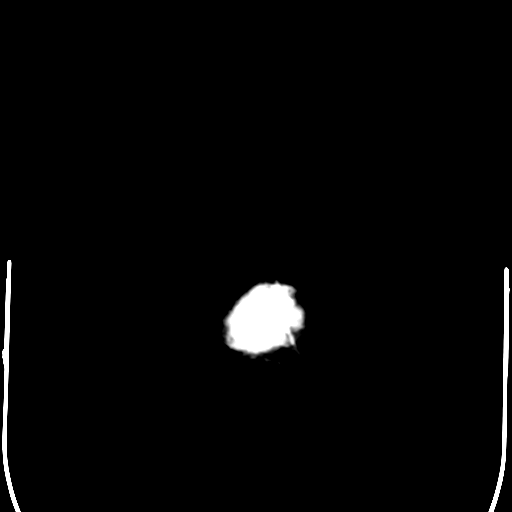

[16 of 30 positions shown; findings below may reference images not displayed]

FINDINGS: The ventricles are normal in size and configuration. There is no
intracranial mass, hemorrhage, extra-axial fluid collection, or
midline shift. Gray-white compartments are normal. No evidence of
acute infarct. The bony calvarium appears intact. The mastoid air
cells are clear. There is debris in each external auditory canal.
IMPRESSION: Probable cerumen in each external auditory canal. No intracranial
mass, hemorrhage, or extra-axial fluid collection. Gray-white
compartments appear normal.

## 2014-07-01 MED ORDER — ENOXAPARIN SODIUM 40 MG/0.4ML ~~LOC~~ SOLN
40.0000 mg | SUBCUTANEOUS | Status: DC
  Administered 2014-07-01 – 2014-07-03 (×3): 40 mg via SUBCUTANEOUS
  Filled 2014-07-01 (×4): qty 0.4

## 2014-07-01 MED ORDER — ACETAMINOPHEN 650 MG RE SUPP: 650.0000 mg | RECTAL | Status: DC | PRN

## 2014-07-01 MED ORDER — STERILE WATER FOR INJECTION IJ SOLN
INTRAMUSCULAR | Status: AC
  Filled 2014-07-01: qty 10

## 2014-07-01 MED ORDER — ZIPRASIDONE MESYLATE 20 MG IM SOLR
20.0000 mg | Freq: Once | INTRAMUSCULAR | Status: AC
Start: 2014-07-01 — End: 2014-07-01
  Administered 2014-07-01: 20 mg via INTRAMUSCULAR
  Filled 2014-07-01: qty 20

## 2014-07-01 MED ORDER — LORAZEPAM 2 MG/ML IJ SOLN
2.0000 mg | Freq: Once | INTRAMUSCULAR | Status: AC
  Administered 2014-07-01: 2 mg via INTRAVENOUS

## 2014-07-01 MED ORDER — LORAZEPAM 2 MG/ML IJ SOLN: 0.0000 mg | Freq: Two times a day (BID) | INTRAMUSCULAR | Status: DC

## 2014-07-01 MED ORDER — SODIUM CHLORIDE 0.9 % IV SOLN: 250.0000 mL | INTRAVENOUS | Status: DC | PRN

## 2014-07-01 MED ORDER — KETAMINE HCL 50 MG/ML IJ SOLN
4.0000 mg/kg | Freq: Once | INTRAMUSCULAR | Status: AC
  Administered 2014-07-01: 245 mg via INTRAMUSCULAR
  Filled 2014-07-01 (×2): qty 4.9

## 2014-07-01 MED ORDER — LORAZEPAM 2 MG/ML IJ SOLN
4.0000 mg | Freq: Once | INTRAMUSCULAR | Status: AC
  Administered 2014-07-01: 4 mg via INTRAVENOUS
  Filled 2014-07-01: qty 2

## 2014-07-01 MED ORDER — ONDANSETRON HCL 4 MG/2ML IJ SOLN: 4.0000 mg | Freq: Four times a day (QID) | INTRAMUSCULAR | Status: DC | PRN

## 2014-07-01 MED ORDER — LORAZEPAM 2 MG/ML IJ SOLN
INTRAMUSCULAR | Status: AC
  Filled 2014-07-01: qty 1

## 2014-07-01 MED ORDER — NICOTINE 21 MG/24HR TD PT24
21.0000 mg | MEDICATED_PATCH | Freq: Every day | TRANSDERMAL | Status: DC
  Administered 2014-07-01 – 2014-07-04 (×4): 21 mg via TRANSDERMAL
  Filled 2014-07-01 (×4): qty 1

## 2014-07-01 MED ORDER — LORAZEPAM 1 MG PO TABS
1.0000 mg | ORAL_TABLET | Freq: Once | ORAL | Status: AC
  Administered 2014-07-01: 1 mg via ORAL
  Filled 2014-07-01: qty 1

## 2014-07-01 MED ORDER — LORAZEPAM 1 MG PO TABS
0.0000 mg | ORAL_TABLET | Freq: Two times a day (BID) | ORAL | Status: DC
Start: 2014-07-01 — End: 2014-07-01
  Filled 2014-07-01: qty 1

## 2014-07-01 MED ORDER — FENTANYL CITRATE (PF) 100 MCG/2ML IJ SOLN: 25.0000 ug | INTRAMUSCULAR | Status: DC | PRN

## 2014-07-01 MED ORDER — ASPIRIN 81 MG PO CHEW: 324.0000 mg | CHEWABLE_TABLET | ORAL | Status: DC

## 2014-07-01 MED ORDER — ZIPRASIDONE MESYLATE 20 MG IM SOLR
20.0000 mg | Freq: Once | INTRAMUSCULAR | Status: AC
  Administered 2014-07-01: 20 mg via INTRAMUSCULAR
  Filled 2014-07-01: qty 20

## 2014-07-01 MED ORDER — THIAMINE HCL 100 MG/ML IJ SOLN
Freq: Once | INTRAVENOUS | Status: AC
  Administered 2014-07-01: 23:00:00 via INTRAVENOUS
  Filled 2014-07-01: qty 1000

## 2014-07-01 MED ORDER — LORAZEPAM 2 MG/ML IJ SOLN
2.0000 mg | INTRAMUSCULAR | Status: DC | PRN
  Administered 2014-07-02: 2 mg via INTRAVENOUS
  Administered 2014-07-02 (×2): 4 mg via INTRAVENOUS
  Administered 2014-07-04: 2 mg via INTRAVENOUS
  Filled 2014-07-01: qty 1
  Filled 2014-07-01: qty 2
  Filled 2014-07-01: qty 1
  Filled 2014-07-01: qty 2

## 2014-07-01 MED ORDER — VITAMIN B-1 100 MG PO TABS
100.0000 mg | ORAL_TABLET | Freq: Every day | ORAL | Status: DC
  Administered 2014-07-01 – 2014-07-04 (×3): 100 mg via ORAL
  Filled 2014-07-01 (×4): qty 1

## 2014-07-01 MED ORDER — SODIUM CHLORIDE 0.9 % IV SOLN
INTRAVENOUS | Status: DC | PRN
  Administered 2014-07-01: 1000 mL via INTRAVENOUS

## 2014-07-01 MED ORDER — ACETAMINOPHEN 325 MG PO TABS
650.0000 mg | ORAL_TABLET | ORAL | Status: DC | PRN
  Administered 2014-07-03 (×2): 650 mg via ORAL
  Filled 2014-07-01 (×2): qty 2

## 2014-07-01 MED ORDER — HALOPERIDOL LACTATE 5 MG/ML IJ SOLN: 10.0000 mg | Freq: Four times a day (QID) | INTRAMUSCULAR | Status: DC | PRN

## 2014-07-01 MED ORDER — LORAZEPAM 2 MG/ML IJ SOLN
0.0000 mg | Freq: Four times a day (QID) | INTRAMUSCULAR | Status: DC
  Administered 2014-07-01 (×2): 2 mg via INTRAVENOUS
  Filled 2014-07-01: qty 1
  Filled 2014-07-01: qty 2
  Filled 2014-07-01: qty 1

## 2014-07-01 MED ORDER — LORAZEPAM 2 MG/ML IJ SOLN: 1.0000 mg | INTRAMUSCULAR | Status: DC | PRN

## 2014-07-01 MED ORDER — ASPIRIN 300 MG RE SUPP: 300.0000 mg | RECTAL | Status: DC

## 2014-07-01 MED ORDER — ADULT MULTIVITAMIN W/MINERALS CH
1.0000 | ORAL_TABLET | Freq: Every day | ORAL | Status: DC
  Administered 2014-07-03 – 2014-07-04 (×2): 1 via ORAL
  Filled 2014-07-01 (×3): qty 1

## 2014-07-01 MED ORDER — HALOPERIDOL LACTATE 5 MG/ML IJ SOLN
10.0000 mg | Freq: Four times a day (QID) | INTRAMUSCULAR | Status: DC | PRN
  Administered 2014-07-01: 10 mg via INTRAMUSCULAR
  Filled 2014-07-01: qty 2

## 2014-07-01 MED ORDER — FOLIC ACID 1 MG PO TABS
1.0000 mg | ORAL_TABLET | Freq: Every day | ORAL | Status: DC
  Administered 2014-07-03 – 2014-07-04 (×2): 1 mg via ORAL
  Filled 2014-07-01 (×3): qty 1

## 2014-07-01 MED ORDER — FENTANYL CITRATE (PF) 100 MCG/2ML IJ SOLN
25.0000 ug | INTRAMUSCULAR | Status: DC | PRN
  Administered 2014-07-02 – 2014-07-03 (×4): 25 ug via INTRAVENOUS
  Filled 2014-07-01 (×4): qty 2

## 2014-07-01 MED ORDER — DEXMEDETOMIDINE HCL IN NACL 200 MCG/50ML IV SOLN
0.4000 ug/kg/h | INTRAVENOUS | Status: DC
  Administered 2014-07-01: 0.4 ug/kg/h via INTRAVENOUS
  Administered 2014-07-02: 0.5 ug/kg/h via INTRAVENOUS
  Administered 2014-07-02: 0.4 ug/kg/h via INTRAVENOUS
  Administered 2014-07-02 (×2): 1 ug/kg/h via INTRAVENOUS
  Administered 2014-07-02: 0.5 ug/kg/h via INTRAVENOUS
  Administered 2014-07-03: 0.2 ug/kg/h via INTRAVENOUS
  Filled 2014-07-01 (×2): qty 50
  Filled 2014-07-01: qty 100
  Filled 2014-07-01 (×3): qty 50

## 2014-07-01 MED ORDER — LORAZEPAM 2 MG/ML IJ SOLN
2.0000 mg | Freq: Once | INTRAMUSCULAR | Status: AC
  Administered 2014-07-01: 2 mg via INTRAMUSCULAR
  Filled 2014-07-01: qty 1

## 2014-07-01 MED ORDER — LORAZEPAM 1 MG PO TABS
1.0000 mg | ORAL_TABLET | Freq: Four times a day (QID) | ORAL | Status: DC | PRN
Start: 2014-07-01 — End: 2014-07-04
  Administered 2014-07-03 (×2): 1 mg via ORAL
  Filled 2014-07-01 (×2): qty 1

## 2014-07-01 MED ORDER — LORAZEPAM 1 MG PO TABS
0.0000 mg | ORAL_TABLET | Freq: Four times a day (QID) | ORAL | Status: DC
  Administered 2014-07-01: 1 mg via ORAL

## 2014-07-01 MED ORDER — THIAMINE HCL 100 MG/ML IJ SOLN
100.0000 mg | Freq: Every day | INTRAMUSCULAR | Status: DC
  Administered 2014-07-02 – 2014-07-03 (×2): 100 mg via INTRAVENOUS
  Filled 2014-07-01 (×2): qty 1

## 2014-07-01 NOTE — ED Notes (Signed)
Patient constantly having to be redirected to his room. Patient states, " I am talking to my friends we are at a house on the porch." MD notfied.

## 2014-07-01 NOTE — ED Notes (Signed)
Spoke with mother to inform of pt's transfer.

## 2014-07-01 NOTE — ED Notes (Addendum)
Pt mildly agitated and restless. MD notified. All belongings sent home with mother.

## 2014-07-01 NOTE — ED Notes (Signed)
MD at bedside. 

## 2014-07-01 NOTE — BH Assessment (Addendum)
Tele Assessment Note   Zachary Mata is an 34 y.o. male. Pt denies SI/HI. Pt reports falling 2x on concrete after consuming Benzos and alcohol. Pt's mother Zachary Mata states that the Pt was seen trying to cut the lawn without a lawn mower and pushing parked cars. Pt states he does not recall attempting to mow the lawn nor push cars. Per EDP Pt was seen talking to individuals who are not present. Pt and his mother state that the behavior began after the falls. Pt denies previous mental health treatment. Pt denies current medication. Pt reports drinking a 12 pack of beer a day. Pt denies SA. Pt reports vomiting and having diarrehea after the falls. Pt states "I felt funny after I feel." Pt reports decreased sleep since the fall. Pt has pending DUI case on 07/05/14.   Writer consulted with Zachary Capriceonrad, Zachary Mata. Per Zachary Mata Pt meets inpatient criteria. TTS will seek placement. RN and EDP informed of disposition.   Axis I: Psychotic Disorder NOS Axis II: Deferred Axis III:  Past Medical History  Diagnosis Date  . Hypertension   . Chronic back pain 03/02/2012  . Essential hypertension 03/02/2012   Axis IV: housing problems, other psychosocial or environmental problems and problems related to social environment Axis V: 41-50 serious symptoms  Past Medical History:  Past Medical History  Diagnosis Date  . Hypertension   . Chronic back pain 03/02/2012  . Essential hypertension 03/02/2012    Past Surgical History  Procedure Laterality Date  . Hand surgery    . Wrist surgery      Family History:  Family History  Problem Relation Age of Onset  . Breast cancer    . Cancer Mother     breats CA  . Diabetes Mother     Social History:  reports that he has been smoking Cigarettes.  He has a 15 pack-year smoking history. He has never used smokeless tobacco. He reports that he drinks alcohol. He reports that he does not use illicit drugs.  Additional Social History:  Alcohol / Drug Use Pain Medications: Pt  denies Prescriptions: Pt denies Over the Counter: Pt denies History of alcohol / drug use?: Yes Substance #1 Name of Substance 1: Alcohol 1 - Age of First Use: 17 1 - Amount (size/oz): 6-12 pack of beer 1 - Frequency: daily 1 - Duration: ongoing 1 - Last Use / Amount: 06/29/14  CIWA: CIWA-Ar BP: (!) 150/108 mmHg Pulse Rate: 102 Nausea and Vomiting: no nausea and no vomiting Tactile Disturbances: none Tremor: three Auditory Disturbances: not present Paroxysmal Sweats: no sweat visible Visual Disturbances: not present Anxiety: mildly anxious Headache, Fullness in Head: none present Agitation: moderately fidgety and restless Orientation and Clouding of Sensorium: oriented and can do serial additions CIWA-Ar Total: 8 COWS:    PATIENT STRENGTHS: (choose at least two) Capable of independent living Communication skills  Allergies:  Allergies  Allergen Reactions  . Sulfa Antibiotics Other (See Comments)    unknown    Home Medications:  (Not in a hospital admission)  OB/GYN Status:  No LMP for male patient.  General Assessment Data Location of Assessment: [facility] ED TTS Assessment: In system Is this a Tele or Face-to-Face Assessment?: Tele Assessment Is this an Initial Assessment or a Re-assessment for this encounter?: Initial Assessment Marital status: Single Maiden name: NA Is patient pregnant?: No Pregnancy Status: No Living Arrangements: Spouse/significant other Can pt return to current living arrangement?: Yes Admission Status: Voluntary Is patient capable of signing voluntary admission?: Yes  Referral Source: Self/Family/Friend Insurance type: Generic     Crisis Care Plan Living Arrangements: Spouse/significant other Name of Psychiatrist: NA Name of Therapist: NA  Education Status Is patient currently in school?: No Current Grade: NA Highest grade of school patient has completed: 12 Name of school: NA Contact person: NA  Risk to self with the past 6  months Suicidal Ideation: No Has patient been a risk to self within the past 6 months prior to admission? : No Suicidal Intent: No Has patient had any suicidal intent within the past 6 months prior to admission? : No Is patient at risk for suicide?: No Suicidal Plan?: No Has patient had any suicidal plan within the past 6 months prior to admission? : No Access to Means: No What has been your use of drugs/alcohol within the last 12 months?: Pt admits to daily alcohol use. Benzos found in system.  Previous Attempts/Gestures: No How many times?: 0 Other Self Harm Risks: NA Triggers for Past Attempts: None known Intentional Self Injurious Behavior: None Family Suicide History: No Recent stressful life event(s): Other (Comment) (2 falls) Persecutory voices/beliefs?: No Depression: Yes Depression Symptoms:  (Upset about loss of employment) Substance abuse history and/or treatment for substance abuse?: Yes Suicide prevention information given to non-admitted patients: Not applicable  Risk to Others within the past 6 months Homicidal Ideation: No Does patient have any lifetime risk of violence toward others beyond the six months prior to admission? : No Thoughts of Harm to Others: No Current Homicidal Intent: No Current Homicidal Plan: No Access to Homicidal Means: No Identified Victim: NA History of harm to others?: No Assessment of Violence: None Noted Violent Behavior Description: NA Does patient have access to weapons?: No Criminal Charges Pending?: Yes Describe Pending Criminal Charges: DUI Does patient have a court date: Yes Court Date: 07/05/14 Is patient on probation?: No  Psychosis Hallucinations: Visual (Seeing a mower when it was not there. ) Delusions: None noted  Mental Status Report Appearance/Hygiene: Unremarkable Eye Contact: Fair Motor Activity: Freedom of movement Speech: Logical/coherent Level of Consciousness: Alert Mood: Depressed, Euthymic Affect:  Appropriate to circumstance Anxiety Level: None Thought Processes: Coherent, Relevant Judgement: Unimpaired Orientation: Person, Place, Time, Situation, Appropriate for developmental age Obsessive Compulsive Thoughts/Behaviors: None  Cognitive Functioning Concentration: Normal Memory: Recent Intact, Remote Intact IQ: Average Insight: Fair Impulse Control: Fair Appetite: Fair Weight Loss: 0 Weight Gain: 0 Sleep: Decreased Total Hours of Sleep: 3 Vegetative Symptoms: None  ADLScreening [facility] Assessment Services) Patient's cognitive ability adequate to safely complete daily activities?: Yes Patient able to express need for assistance with ADLs?: Yes Independently performs ADLs?: Yes (appropriate for developmental age)  Prior Inpatient Therapy Prior Inpatient Therapy: No Prior Therapy Dates: NA Prior Therapy Facilty/Provider(s): NA Reason for Treatment: NA  Prior Outpatient Therapy Prior Outpatient Therapy: No Prior Therapy Dates: NA Prior Therapy Facilty/Provider(s): NA Reason for Treatment: NA Does patient have an ACCT team?: No Does patient have Intensive In-House Services?  : No Does patient have [facility] services? : No Does patient have P4CC services?: No  ADL Screening (condition at time of admission) Patient's cognitive ability adequate to safely complete daily activities?: Yes Is the patient deaf or have difficulty hearing?: No Does the patient have difficulty seeing, even when wearing glasses/contacts?: No Does the patient have difficulty concentrating, remembering, or making decisions?: No Patient able to express need for assistance with ADLs?: Yes Does the patient have difficulty dressing or bathing?: No Independently performs ADLs?: Yes (appropriate for developmental age) Does the  patient have difficulty walking or climbing stairs?: No Weakness of Legs: None Weakness of Arms/Hands: None       Abuse/Neglect Assessment (Assessment to be complete while  patient is alone) Physical Abuse: Denies Verbal Abuse: Denies Sexual Abuse: Denies Exploitation of patient/patient's resources: Denies Self-Neglect: Denies     Merchant navy officer (For Healthcare) Does patient have an advance directive?: No Would patient like information on creating an advanced directive?: No - patient declined information    Additional Information 1:1 In Past 12 Months?: No CIRT Risk: No Elopement Risk: No Does patient have medical clearance?: No     Disposition:  Disposition Initial Assessment Completed for this Encounter: Yes Disposition of Patient: Inpatient treatment program Type of inpatient treatment program: Adult  Emmit Pomfret 07/01/2014 12:55 PM

## 2014-07-01 NOTE — ED Notes (Signed)
Pt attempted to give urine sample with no success. Pt given water to drink.

## 2014-07-01 NOTE — ED Provider Notes (Signed)
CSN: [id number]     Arrival date & time 07/01/14  4098 History   First MD Initiated Contact with Patient 07/01/14 0901     Chief Complaint  Patient presents with  . Fall  . Head Injury     (Consider location/radiation/quality/duration/timing/severity/associated sxs/prior Treatment) HPI Comments: 34 year old male by family with complaints for mental health problems. Family initially reports it brought him as he had falls past several days striking his head. Patient does endorse using occasional falls however states he's been intoxicated and falling. Family's bigger concern is patient's bizarre behavior the last several days. States he's having hallucinations.HEENT pupils were not there. He is going to the parking lot the middle the night and pushing cars stating they are out of gas. Family also endorses lack sleep for the past 3-4 days.  Patient is a 34 y.o. male presenting with mental health disorder.  Mental Health Problem Presenting symptoms: disorganized thought process and hallucinations   Presenting symptoms: no suicidal thoughts   Patient accompanied by:  Family member Degree of incapacity (severity):  Severe Onset quality:  Gradual Timing:  Constant Progression:  Worsening Chronicity:  New Context: alcohol use   Treatment compliance:  Untreated Associated symptoms: no abdominal pain, no chest pain and no headaches     Past Medical History  Diagnosis Date  . Hypertension   . Chronic back pain 03/02/2012  . Essential hypertension 03/02/2012   Past Surgical History  Procedure Laterality Date  . Hand surgery    . Wrist surgery     Family History  Problem Relation Age of Onset  . Breast cancer    . Cancer Mother     breats CA  . Diabetes Mother    History  Substance Use Topics  . Smoking status: Current Every Day Smoker -- 1.00 packs/day for 15 years    Types: Cigarettes  . Smokeless tobacco: Never Used  . Alcohol Use: Yes     Comment: occassionally    Review of  Systems  Constitutional: Negative for fever.  HENT: Negative for congestion.   Respiratory: Negative for cough.   Cardiovascular: Negative for chest pain.  Gastrointestinal: Negative for abdominal pain.  Musculoskeletal: Positive for back pain (chronic).  Skin: Negative for rash.  Neurological: Negative for headaches.  Psychiatric/Behavioral: Positive for hallucinations, confusion and sleep disturbance. Negative for suicidal ideas.  All other systems reviewed and are negative.     Allergies  Sulfa antibiotics  Home Medications   Prior to Admission medications   Medication Sig Start Date End Date Taking? Authorizing Provider  ibuprofen (ADVIL,MOTRIN) 200 MG tablet Take 600 mg by mouth every 6 (six) hours as needed (pain).   Yes Historical Provider, MD  Naphazoline HCl (CLEAR EYES OP) Apply 1 drop to eye daily as needed (dry eyes). For dry eyes   Yes Historical Provider, MD  SUMAtriptan (IMITREX) 50 MG tablet Take 50 mg by mouth every 2 (two) hours as needed for migraine.  09/09/13   Historical Provider, MD   BP 150/108 mmHg  Pulse 102  Temp(Src) 98.3 F (36.8 C) (Oral)  Resp 18  Ht  (1.676 m)  Wt 136 lb (61.689 kg)  BMI 21.96 kg/m2  SpO2 97% Physical Exam  Constitutional: He is oriented to person, place, and time. He appears well-developed.  HENT:  Head: Normocephalic and atraumatic.  Eyes: Pupils are equal, round, and reactive to light.  Neck: Normal range of motion.  Cardiovascular: Normal rate and intact distal pulses.   No murmur  heard. Pulmonary/Chest: Effort normal. No respiratory distress.  Abdominal: Soft. He exhibits no distension.  Musculoskeletal: Normal range of motion. He exhibits no tenderness.  Neurological: He is alert and oriented to person, place, and time. No cranial nerve deficit. He exhibits normal muscle tone.  Skin: Skin is warm.  Psychiatric: His speech is tangential.  Vitals reviewed.   ED Course  Procedures (including critical care  time) Labs Review Labs Reviewed  CBC - Abnormal; Notable for the following:    MCH 35.6 (*)    All other components within normal limits  URINE RAPID DRUG SCREEN (HOSP PERFORMED) NOT AT [facility] - Abnormal; Notable for the following:    Benzodiazepines POSITIVE (*)    All other components within normal limits  URINALYSIS, ROUTINE W REFLEX MICROSCOPIC (NOT AT [facility]) - Abnormal; Notable for the following:    Color, Urine AMBER (*)    Bilirubin Urine SMALL (*)    Ketones, ur 15 (*)    Leukocytes, UA TRACE (*)    All other components within normal limits  ACETAMINOPHEN LEVEL - Abnormal; Notable for the following:    Acetaminophen (Tylenol), Serum <10 (*)    All other components within normal limits  BASIC METABOLIC PANEL  ETHANOL  SALICYLATE LEVEL  URINE MICROSCOPIC-ADD ON    Imaging Review Ct Head Wo Contrast  07/01/2014   CLINICAL DATA:  Patient tripped and fell, hitting right side of head  EXAM: CT HEAD WITHOUT CONTRAST  TECHNIQUE: Contiguous axial images were obtained from the base of the skull through the vertex without intravenous contrast.  COMPARISON:  May 20, 2005  FINDINGS: The ventricles are normal in size and configuration. There is no intracranial mass, hemorrhage, extra-axial fluid collection, or midline shift. Gray-white compartments are normal. No evidence of acute infarct. The bony calvarium appears intact. The mastoid air cells are clear. There is debris in each external auditory canal.  IMPRESSION: Probable cerumen in each external auditory canal. No intracranial mass, hemorrhage, or extra-axial fluid collection. Gray-white compartments appear normal.   Electronically Signed   By: Bretta BangWilliam  Woodruff III M.D.   On: 07/01/2014 10:00     EKG Interpretation None      MDM  34 year old male with no significant past medical history presents with family with concerns for falls and head injury. During my initial evaluation patient's family also voiced concern about his bizarre  behaviors. Per their report he is been talking to people who are not there. He has been staying up for multiple days straight. He has been going into the street and attempting to push cars stating they are out of gas and needs to push to the nearest gas station. On exam he is very tangential. His speech is pressured. He denies SI HI. Concern for possible new onset schizophrenia versus bipolar. Labs were unremarkable and showed nor getting cause. He was positive for benzodiazepine's in endorses taking Valium 1-2 days ago. This may have been partially depressing patient's manic state. Which is now more apparent. Patient did have fall and hit his head several days ago. He has no visible trauma to the head and normal CT. Do not suspect any major injuries from fall. Discussed with psychiatry M.D. who is in agreement patient will need further inpatient evaluation. Discussed with patient who is agreeable with plan for a voluntary commitment. He was given 1 mg of Ativan as he was becoming slightly more agitated in the emergency department. Distal planning per psychiatry team. Patient otherwise stable and medically cleared.  Psych management placement pending. Voluntary committment at this point. If desires to leave plan for IVC placement.    Final diagnoses:  Mental health problem        Bridgett Larsson, MD 07/01/14 1316  Bridgett Larsson, MD 07/01/14 1526  Blake Divine, MD 07/01/14 1610

## 2014-07-01 NOTE — ED Notes (Signed)
ED staff at bedside patient is needing to redirected to room.

## 2014-07-01 NOTE — H&P (Signed)
PULMONARY / CRITICAL CARE MEDICINE   Name: Zachary Mata MRN: [id number] DOB: November 11, 1980    ADMISSION DATE:  07/01/2014 CONSULTATION DATE:  07/01/2014  REFERRING MD :  Dr. Loretha StaplerWofford  CHIEF COMPLAINT:  Fall  HISTORY OF PRESENT ILLNESS:  [name] presented to the ED who was brought in by his mother for abnormal behavior. He has an extensive EtOH use history as well as some other "bootlegger" substances per his mother which is noted in the chart. Per report, the patient had been having hallucinations and has not been sleeping for the past couple days.   Upon my arrival to the patient's bedside he was in 4-point restraints actively hallucinating and extremely agitated thrashing around in bed with violent behavior. Was unable to converse with the patient nor obtain any history. He was speaking to people who weren't there. I was concerned for the safety of the nursing staff as well as myself upon examining the patient  PAST MEDICAL HISTORY :   has a past medical history of Hypertension; Chronic back pain (03/02/2012); and Essential hypertension (03/02/2012).  has past surgical history that includes Hand surgery and Wrist surgery. Prior to Admission medications   Medication Sig Start Date End Date Taking? Authorizing Provider  ibuprofen (ADVIL,MOTRIN) 200 MG tablet Take 600 mg by mouth every 6 (six) hours as needed (pain).   Yes Historical Provider, MD  Naphazoline HCl (CLEAR EYES OP) Apply 1 drop to eye daily as needed (dry eyes). For dry eyes   Yes Historical Provider, MD  SUMAtriptan (IMITREX) 50 MG tablet Take 50 mg by mouth every 2 (two) hours as needed for migraine.  09/09/13   Historical Provider, MD   Allergies  Allergen Reactions  . Sulfa Antibiotics Other (See Comments)    unknown    FAMILY HISTORY:  indicated that his mother is alive. He indicated that his father is alive.  SOCIAL HISTORY:  reports that he has been smoking Cigarettes.  He has a 15 pack-year smoking history. He has never  used smokeless tobacco. He reports that he drinks alcohol. He reports that he does not use illicit drugs.  REVIEW OF SYSTEMS:  Unable to obtain as the patient is combative and not answering questions.  SUBJECTIVE:   VITAL SIGNS: Temp:  [98.3 F (36.8 C)-100.4 F (38 C)] 100.4 F (38 C) (05/27 2132) Pulse Rate:  [93-155] 123 (05/27 2132) Resp:  [16-32] 24 (05/27 2132) BP: (146-196)/(98-160) 152/112 mmHg (05/27 2132) SpO2:  [96 %-99 %] 97 % (05/27 2132) Weight:  [136 lb (61.689 kg)] 136 lb (61.689 kg) (05/27 0849) HEMODYNAMICS:   VENTILATOR SETTINGS: Nonapplicable   INTAKE / OUTPUT:  Intake/Output Summary (Last 24 hours) at 07/01/14 2154 Last data filed at 07/01/14 2150  Gross per 24 hour  Intake   2400 ml  Output      0 ml  Net   2400 ml    PHYSICAL EXAMINATION: General:  34 year old Caucasian male who appears his appear state age peers to be in acute distress he is awake but not alert to commands and does not appear to be oriented that is well nourished and well developed Neuro:  A formal neurologic exam was unable to be performed very to continue thrashing in the bed however it is noted that the patient is moving all 4 extremities symmetrically and would at times be able to relate coherent sentences but we are unable to determine there were for. Strength appeared to be intact. HEENT: Head, normocephalic, atraumatic.  Ears symmetric,  Eyes: We will unable to assess secondary unable to assess his nose oropharynx oral cavity. Cardiovascular: S1S2 he was tachycardic no murmurs, rubs, or gallops auscultated. No thrills palpated.  Lungs:  Chest symmetrical with respirations, clear to auscultation bilaterally with no wheezing, crackles, equal expansion. He was tachypneic when agitated Abdomen:  Soft, nontender, no guarding, nondistended. Present bowel sounds. Musculoskeletal:  No muscle atrophy noted nor weakness. ROM intact.  Skin:  No notable scars, rashes, cruises. No bed sores  noted. The patient had notable tattoos on his upper extremities Lymphatics: I was unable to assess this and very to thrashing, agitation and hallucinations    LABS:  CBC  Recent Labs Lab 07/01/14 1019  WBC 8.2  HGB 16.2  HCT 45.0  PLT 193   Coag's No results for input(s): APTT, INR in the last 168 hours. BMET  Recent Labs Lab 07/01/14 1019  NA 135  K 3.8  CL 101  CO2 22  BUN 9  CREATININE 0.75  GLUCOSE 98   Electrolytes  Recent Labs Lab 07/01/14 1019  CALCIUM 9.4   Sepsis Markers No results for input(s): LATICACIDVEN, PROCALCITON, O2SATVEN in the last 168 hours. ABG No results for input(s): PHART, PCO2ART, PO2ART in the last 168 hours. Liver Enzymes No results for input(s): AST, ALT, ALKPHOS, BILITOT, ALBUMIN in the last 168 hours. Cardiac Enzymes No results for input(s): TROPONINI, PROBNP in the last 168 hours. Glucose No results for input(s): GLUCAP in the last 168 hours.  Imaging Ct Head Wo Contrast  07/01/2014   CLINICAL DATA:  Patient tripped and fell, hitting right side of head  EXAM: CT HEAD WITHOUT CONTRAST  TECHNIQUE: Contiguous axial images were obtained from the base of the skull through the vertex without intravenous contrast.  COMPARISON:  May 20, 2005  FINDINGS: The ventricles are normal in size and configuration. There is no intracranial mass, hemorrhage, extra-axial fluid collection, or midline shift. Gray-white compartments are normal. No evidence of acute infarct. The bony calvarium appears intact. The mastoid air cells are clear. There is debris in each external auditory canal.  IMPRESSION: Probable cerumen in each external auditory canal. No intracranial mass, hemorrhage, or extra-axial fluid collection. Gray-white compartments appear normal.   Electronically Signed   By: Bretta Bang III M.D.   On: 07/01/2014 10:00     ASSESSMENT / PLAN:  PULMONARY OETT not present A: No active issues P:  We will monitor for airway  protection.   CARDIOVASCULAR CVL present A: No active issues P: We will check serial EKGs as this patient will be receiving QTC prolonging medications.   RENAL A:  No active issues. Elevated CK but not greater than 5000 as would be expected in rhabdomyolysis P:  Aggressive IV fluid management   GASTROINTESTINAL A:  Transaminitis, mild. Elevated total bilirubin  P:   if this does not resolve one could consider a right upper quadrant ultrasound. Will check a hepatitis panel.  HEMATOLOGIC A:Leukocytosis, likely reactive P: Monitor  INFECTIOUS A:  do not suspect an infectious etiology at this point  P:  monitor   ENDOCRINE A: No active issues  NEUROLOGIC A: likely EtOH withdrawal/psychosis with delirium tremens. Negative anion gap, negative salicylates, negative acetaminophen, and the urine drug screen only positive for benzodiazepines -Status post Geodon, Ativan, Haldol, ketamine -Last drink appears to be 06/29/2014  P:  We'll provide the patient with benzodiazepines as well as a dexmedetomidine drip. Continue to monitor the patient for possible airway compromise. RASS0 to -  1  FAMILY  - Updates: Did not get to speak to the patient's family is there were not present emergency department when I was there. It is noted by the nursing staff in the ED his mother has been in contact with [place].  Critical care time 38 minutes.  Deetta Perla, MD Critical Care Medicine [facility]/Critical [facility] Pager: [phone]  07/01/2014, 9:54 PM

## 2014-07-01 NOTE — ED Notes (Signed)
Called staffing for sitter. Security at bedside

## 2014-07-01 NOTE — Code Documentation (Signed)
Pt moved to rm C23-- placed on cardiac monitor-- Dr. Judd Lienelo at bedside-- pt remains in 4 pt restraints.

## 2014-07-01 NOTE — ED Provider Notes (Signed)
I was called to the patient's room to evaluate him for agitation and combativeness. He was just placed into the psychiatric holding area for new onset psychosis. He has a history of long-standing alcoholism and it appears as though he may be having DTs. Patient is agitated and combative and posing a threat to himself and the emergency department staff. For this reason he was given an injection of Geodon with little effect. He was given an additional 20 mg of Geodon with no relief. He then received multiple doses of Ativan, however continued with severe agitation. In discussion with Dr. Denton LankSteinl, he was given an IM dose of ketamine with little effect. I continue to administer multiple additional doses of Ativan throughout this whole ordeal, however nothing has helped his agitation.  I discussed the case with critical care who have recommended treatment with Precedex. Patient has been started on this further order and [facility]. He will be admitted to the intensive care unit for treatment of what appears to be delirium tremens.  CRITICAL CARE Performed by: Geoffery LyonseLo, Jennylee Uehara Total critical care time: 120 minutes Critical care time was exclusive of separately billable procedures and treating other patients. Critical care was necessary to treat or prevent imminent or life-threatening deterioration. Critical care was time spent personally by me on the following activities: development of treatment plan with patient and/or surrogate as well as nursing, discussions with consultants, evaluation of patient's response to treatment, examination of patient, obtaining history from patient or surrogate, ordering and performing treatments and interventions, ordering and review of laboratory studies, ordering and review of radiographic studies, pulse oximetry and re-evaluation of patient's condition.   Geoffery Lyonsouglas Shandora Koogler, MD 07/01/14 2337

## 2014-07-01 NOTE — ED Notes (Signed)
Patient given snack.  

## 2014-07-01 NOTE — ED Notes (Signed)
Pt c/o trip and fall up one step yesterday hitting head. Pt denies + LOC. Pt does not take blood thinners.

## 2014-07-01 NOTE — Code Documentation (Signed)
Pt extremely agitated, diaphoretic, security and [employer] at bedside. Hard restraints placed.

## 2014-07-01 NOTE — ED Notes (Signed)
Enid DerryDonna Syfert phone number: [phone]

## 2014-07-01 NOTE — ED Notes (Signed)
Meal Tray given patient threw food at the door.

## 2014-07-01 NOTE — ED Notes (Signed)
Patient is becoming more aggressive patient states," I have to kill them before they kill me". Patient continues to  Try and leave room patient redirected to room.

## 2014-07-01 NOTE — Progress Notes (Signed)
Seeking inpatient placement as recommended by psychiatry.  Fax[facility] referral [facility]: [facility]- per [facility]  At capacity for adult units or high acuity units: [facility]- per [facility] (low acuity beds only) [facility]- per [facility]- per margaret, cannot accept referral since pt is not insured- would require $4000-$7000 up-front deposit. [facility]- per [facility]- per [facility]- per Lor[facility] CreedNichole   Amilya Haver, MSW, [facility] Clinical Social Work, Disposition  07/01/2014 [phone]

## 2014-07-01 NOTE — ED Notes (Signed)
Patient shaking bed advised patient to not shake bed. Patient states I am trying to get them.

## 2014-07-01 NOTE — ED Notes (Signed)
TTS at the bedside. 

## 2014-07-01 NOTE — Code Documentation (Addendum)
Family updated as to patient's status. Mother -- [phone]-- mother states pt drinks at least 18 beers and 2-3 bootleggers a day. Did not drink anything yesterday.

## 2014-07-01 NOTE — ED Notes (Signed)
MD at bedside. C-collar removed by Dr. Arlie SolomonsPost.

## 2014-07-01 NOTE — ED Notes (Signed)
Patient attempting to get out of bed 

## 2014-07-01 NOTE — ED Notes (Signed)
MD at bedside. Patient screaming and yelling at staff. Patient attempting to get out of bed.

## 2014-07-02 LAB — MAGNESIUM: Magnesium: 2.1 mg/dL (ref 1.7–2.4)

## 2014-07-02 LAB — BASIC METABOLIC PANEL
Anion gap: 11 (ref 5–15)
BUN: 8 mg/dL (ref 6–20)
CO2: 18 mmol/L — ABNORMAL LOW (ref 22–32)
Calcium: 8.4 mg/dL — ABNORMAL LOW (ref 8.9–10.3)
Chloride: 109 mmol/L (ref 101–111)
Creatinine, Ser: 0.66 mg/dL (ref 0.61–1.24)
GFR calc Af Amer: >60 mL/min (ref >60)
GFR calc non Af Amer: >60 mL/min (ref >60)
Glucose, Bld: 99 mg/dL (ref 65–99)
Potassium: 3.8 mmol/L (ref 3.5–5.1)
Sodium: 138 mmol/L (ref 135–145)

## 2014-07-02 LAB — CBC
HCT: 40.7 % (ref 39.0–52.0)
Hemoglobin: 14.6 g/dL (ref 13.0–17.0)
MCH: 36 pg — ABNORMAL HIGH (ref 26.0–34.0)
MCHC: 35.9 g/dL (ref 30.0–36.0)
MCV: 100.2 fL — ABNORMAL HIGH (ref 78.0–100.0)
Platelets: 162 10*3/uL (ref 150–400)
RBC: 4.06 MIL/uL — ABNORMAL LOW (ref 4.22–5.81)
RDW: 12.3 % (ref 11.5–15.5)
WBC: 10.9 10*3/uL — ABNORMAL HIGH (ref 4.0–10.5)

## 2014-07-02 LAB — HEPATITIS PANEL, ACUTE
HCV Ab: NEGATIVE
Hep A IgM: NONREACTIVE
Hep B C IgM: NONREACTIVE
Hepatitis B Surface Ag: NEGATIVE

## 2014-07-02 LAB — GLUCOSE, CAPILLARY: Glucose-Capillary: 111 mg/dL — ABNORMAL HIGH (ref 65–99)

## 2014-07-02 LAB — MRSA PCR SCREENING: MRSA by PCR: NEGATIVE

## 2014-07-02 LAB — PHOSPHORUS: Phosphorus: 4 mg/dL (ref 2.5–4.6)

## 2014-07-02 NOTE — Progress Notes (Signed)
Patient calm, following commands, restraints were discontinued. Will assess for need for restraints but a this time per RN no need for restraints.  Corliss SkainsJuan Kalyna Paolella RN

## 2014-07-03 ENCOUNTER — Inpatient Hospital Stay (HOSPITAL_COMMUNITY): Payer: PRIVATE HEALTH INSURANCE

## 2014-07-03 DIAGNOSIS — F13239 Sedative, hypnotic or anxiolytic dependence with withdrawal, unspecified: Secondary | ICD-10-CM

## 2014-07-03 DIAGNOSIS — M25561 Pain in right knee: Secondary | ICD-10-CM

## 2014-07-03 LAB — COMPREHENSIVE METABOLIC PANEL
ALT: 60 U/L (ref 17–63)
AST: 62 U/L — ABNORMAL HIGH (ref 15–41)
Albumin: 3.8 g/dL (ref 3.5–5.0)
Alkaline Phosphatase: 68 U/L (ref 38–126)
Anion gap: 14 (ref 5–15)
BUN: 12 mg/dL (ref 6–20)
CO2: 18 mmol/L — ABNORMAL LOW (ref 22–32)
Calcium: 8.7 mg/dL — ABNORMAL LOW (ref 8.9–10.3)
Chloride: 106 mmol/L (ref 101–111)
Creatinine, Ser: 0.97 mg/dL (ref 0.61–1.24)
GFR calc Af Amer: >60 mL/min (ref >60)
GFR calc non Af Amer: >60 mL/min (ref >60)
Glucose, Bld: 86 mg/dL (ref 65–99)
Potassium: 3.7 mmol/L (ref 3.5–5.1)
Sodium: 138 mmol/L (ref 135–145)
Total Bilirubin: 2.4 mg/dL — ABNORMAL HIGH (ref 0.3–1.2)
Total Protein: 6.9 g/dL (ref 6.5–8.1)

## 2014-07-03 LAB — CBC
HCT: 42.3 % (ref 39.0–52.0)
Hemoglobin: 15 g/dL (ref 13.0–17.0)
MCH: 36 pg — ABNORMAL HIGH (ref 26.0–34.0)
MCHC: 35.5 g/dL (ref 30.0–36.0)
MCV: 101.4 fL — ABNORMAL HIGH (ref 78.0–100.0)
Platelets: 170 10*3/uL (ref 150–400)
RBC: 4.17 MIL/uL — ABNORMAL LOW (ref 4.22–5.81)
RDW: 12.1 % (ref 11.5–15.5)
WBC: 10.4 10*3/uL (ref 4.0–10.5)

## 2014-07-03 LAB — CK TOTAL AND CKMB (NOT AT ARMC)
CK, MB: 11.9 ng/mL — ABNORMAL HIGH (ref 0.5–5.0)
Relative Index: 0.8 (ref 0.0–2.5)
Total CK: 1417 U/L — ABNORMAL HIGH (ref 49–397)

## 2014-07-03 IMAGING — DX DG KNEE COMPLETE 4+V*R*
4 series · 4 of 4 positions shown · non-contrast
Comparison: None.

CLINICAL DATA: Tripped and fell 2 days ago, right knee pain and
trauma

EXAM:
RIGHT KNEE - COMPLETE 4+ VIEW

[knee ap]
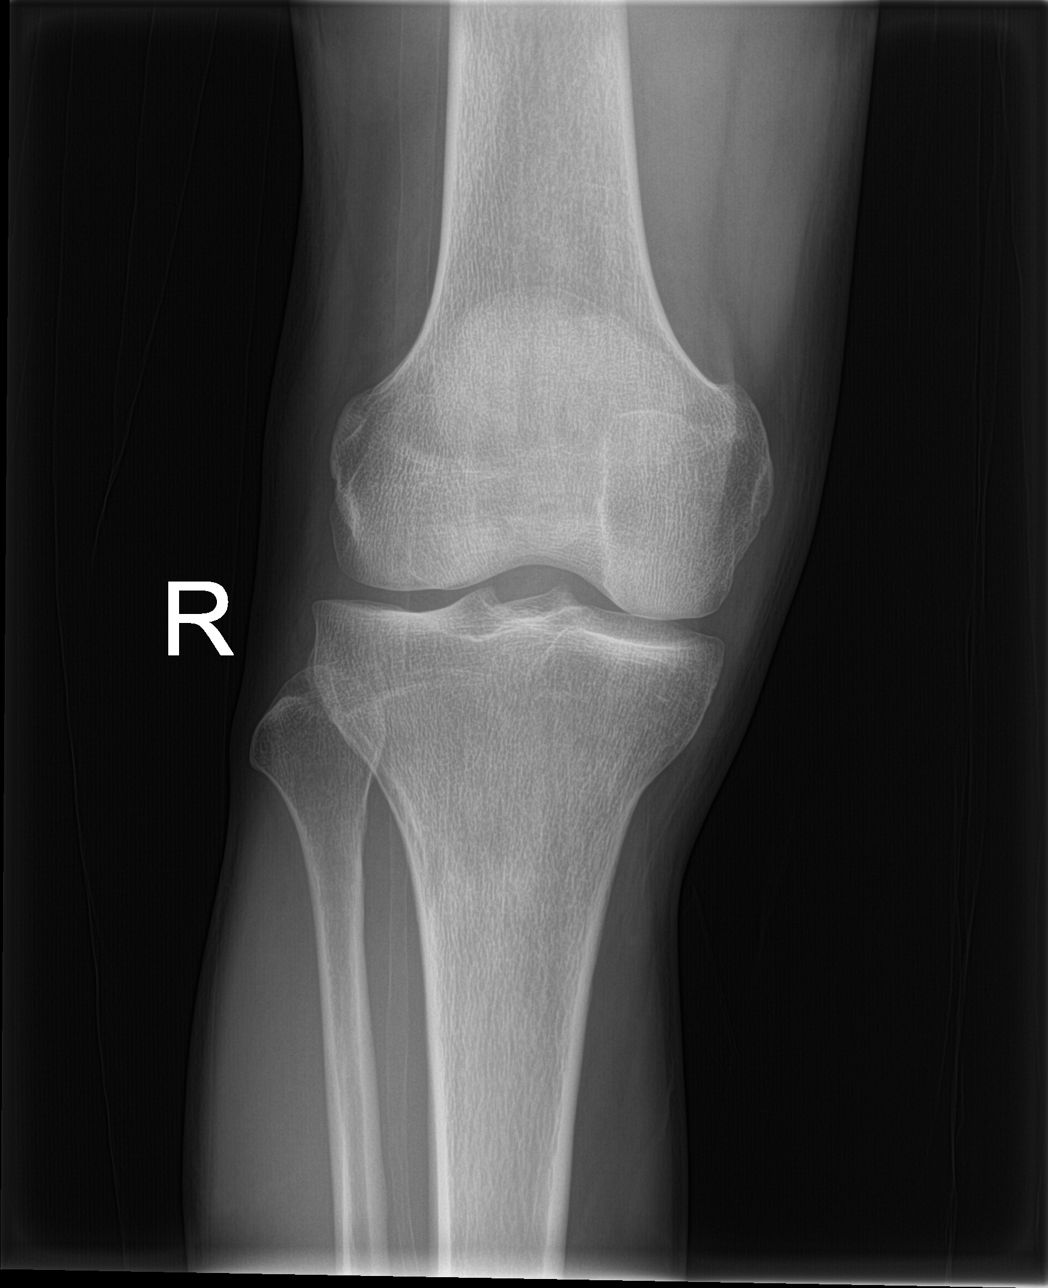

[tunnel]
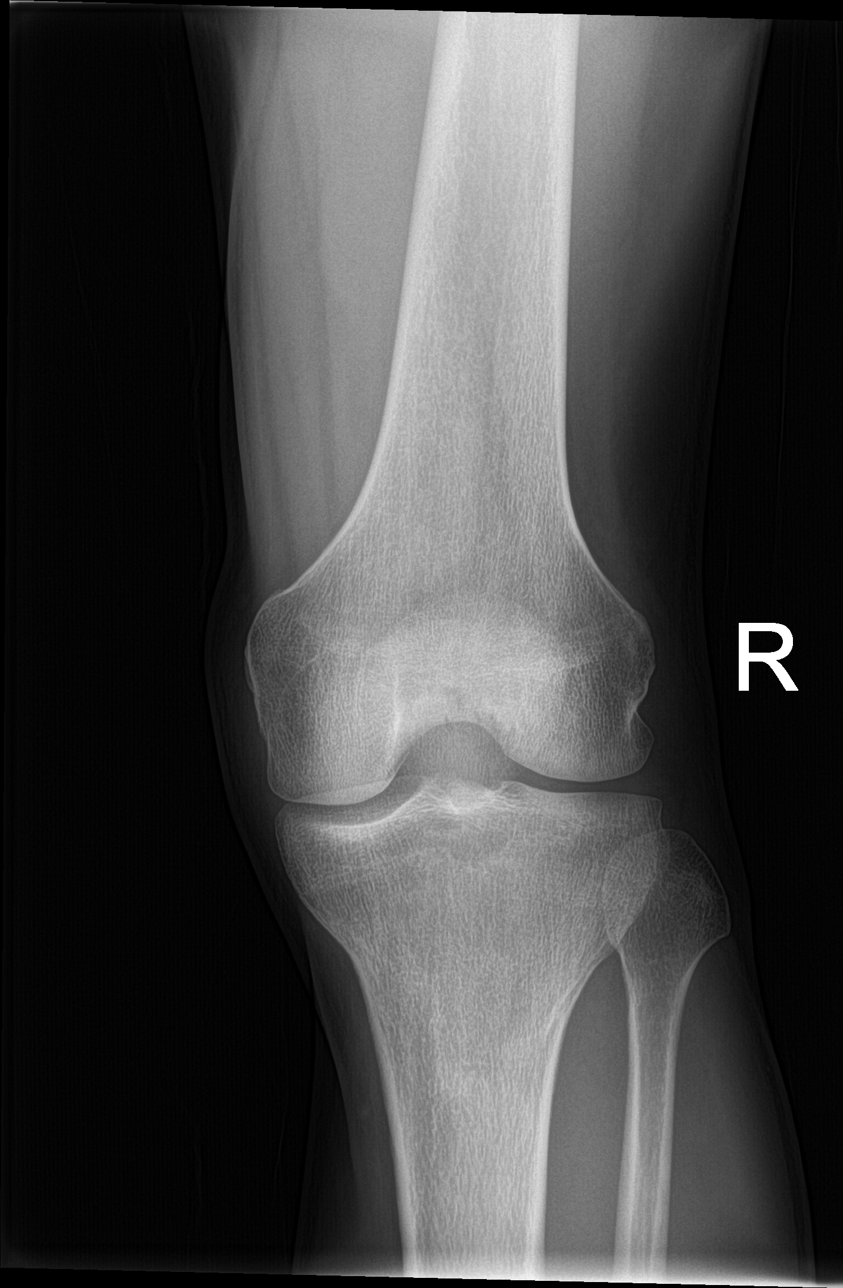

[knee lat]
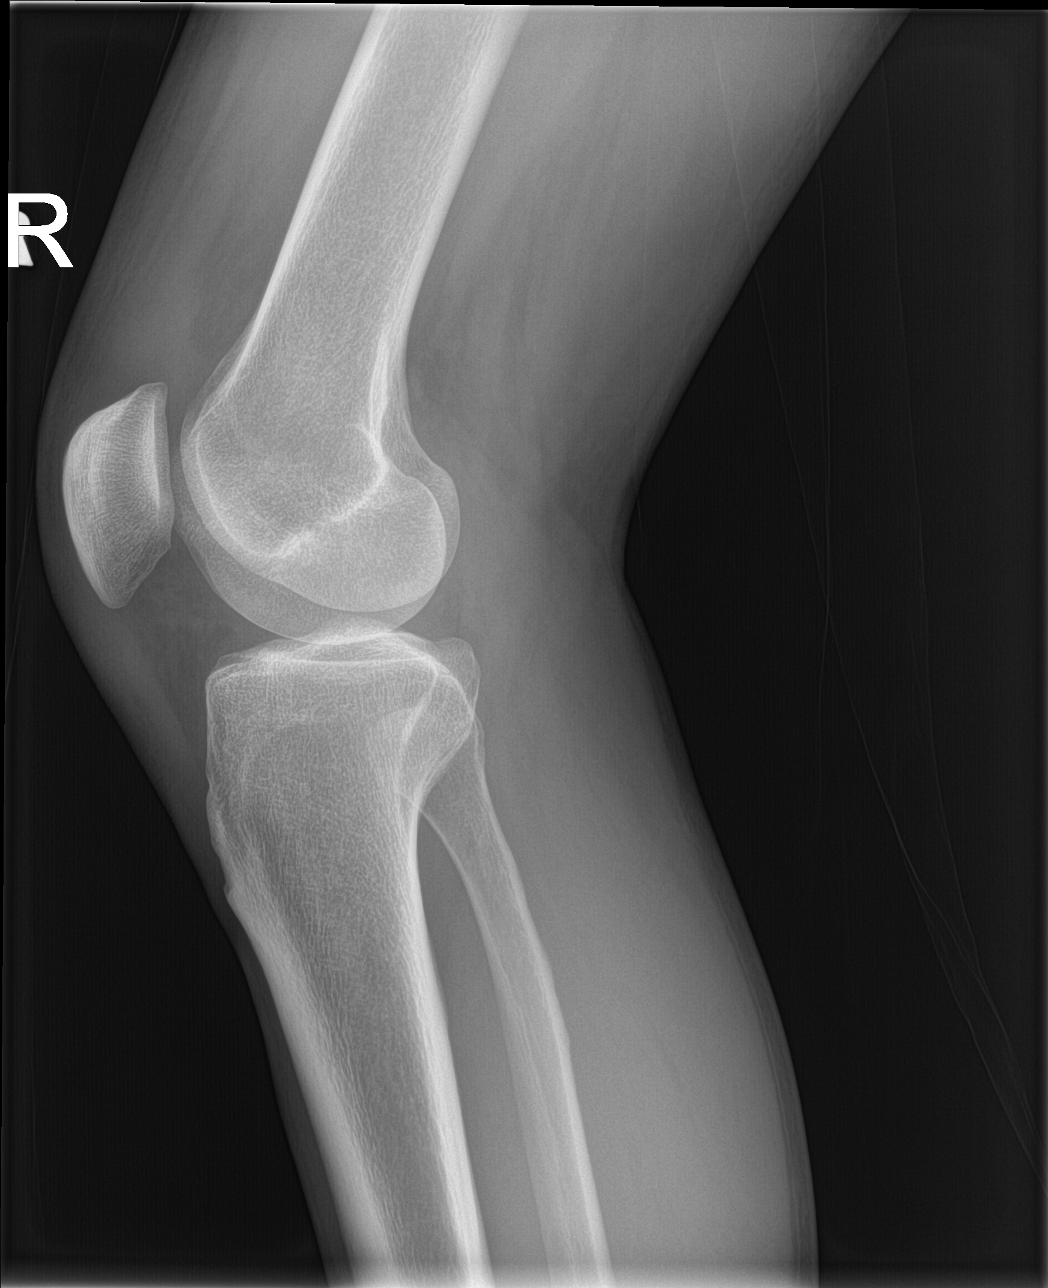

[knee sunrise]
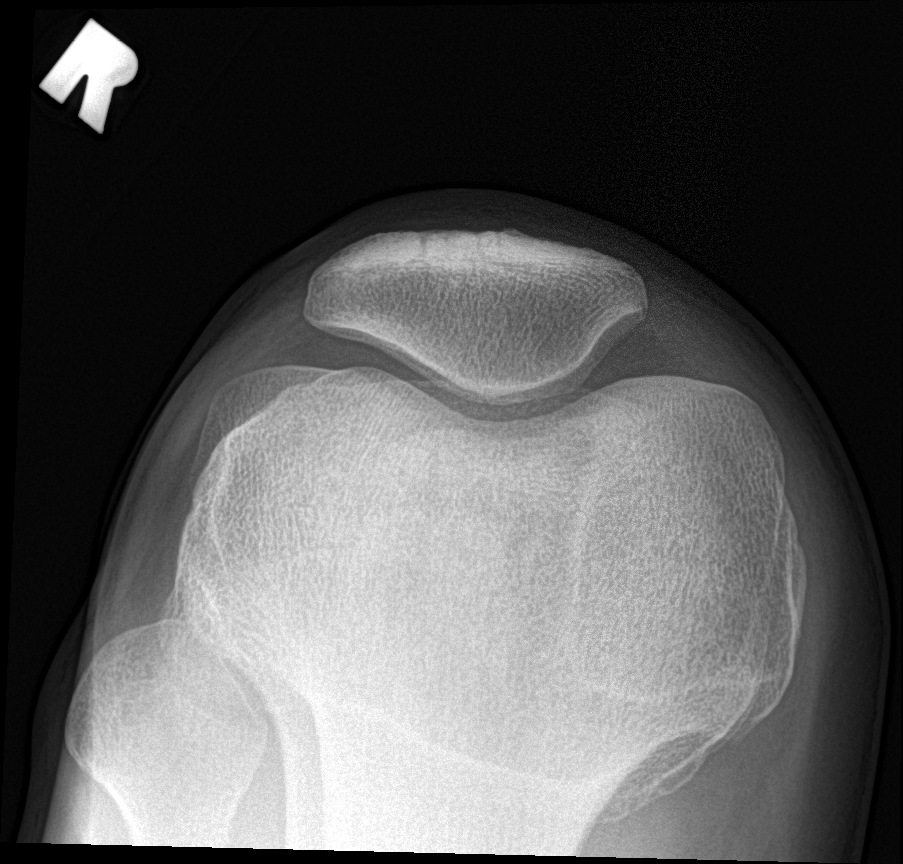

[4 of 4 positions shown; findings below may reference images not displayed]

FINDINGS: There is no evidence of fracture, dislocation, or joint effusion.
There is no evidence of arthropathy or other focal bone abnormality.
Soft tissues are unremarkable.
IMPRESSION: Negative.

## 2014-07-03 MED ORDER — SODIUM CHLORIDE 0.9 % IV SOLN: 250.0000 mL | INTRAVENOUS | Status: DC | PRN

## 2014-07-03 MED ORDER — FENTANYL CITRATE (PF) 100 MCG/2ML IJ SOLN
25.0000 ug | INTRAMUSCULAR | Status: DC | PRN
  Administered 2014-07-03 – 2014-07-04 (×5): 25 ug via INTRAVENOUS
  Filled 2014-07-03 (×5): qty 2

## 2014-07-03 MED ORDER — CHLORDIAZEPOXIDE HCL 5 MG PO CAPS
25.0000 mg | ORAL_CAPSULE | Freq: Two times a day (BID) | ORAL | Status: DC
  Administered 2014-07-03 – 2014-07-04 (×3): 25 mg via ORAL
  Filled 2014-07-03 (×3): qty 5

## 2014-07-03 MED ORDER — FENTANYL CITRATE (PF) 100 MCG/2ML IJ SOLN
25.0000 ug | INTRAMUSCULAR | Status: DC | PRN
  Administered 2014-07-03 (×3): 25 ug via INTRAVENOUS
  Filled 2014-07-03 (×3): qty 2

## 2014-07-03 NOTE — Progress Notes (Signed)
PULMONARY / CRITICAL [facility]   Name: Zachary Mata MRN: [id number] DOB: 09-Oct-1980    ADMISSION DATE:  07/01/2014 CONSULTATION DATE:  07/01/2014  REFERRING MD :  Zachary Mata  CHIEF COMPLAINT:  Fall  HISTORY OF PRESENT ILLNESS:  [name] presented to the ED who was brought in by his mother for abnormal behavior. He has an extensive EtOH use history as well as some other "bootlegger" substances per his mother which is noted in the chart. Per report, the patient had been having hallucinations and has not been sleeping for the past couple days.     SUBJECTIVE:  Pt feeling better, alert and calm  Ready to eat   VITAL SIGNS: Temp:  [98.3 F (36.8 C)-100.4 F (38 C)] 100.4 F (38 C) (05/27 2132) Pulse Rate:  [93-155] 123 (05/27 2132) Resp:  [16-32] 24 (05/27 2132) BP: (146-196)/(98-160) 152/112 mmHg (05/27 2132) SpO2:  [96 %-99 %] 97 % (05/27 2132) Weight:  [136 lb (61.689 kg)] 136 lb (61.689 kg) (05/27 0849) HEMODYNAMICS:   VENTILATOR SETTINGS: Nonapplicable   INTAKE / OUTPUT:    PHYSICAL EXAMINATION: GEN: A/Ox3; pleasant , NAD, thin   HEENT:  [place]/AT,  EACs-clear, TMs-wnl, NOSE-clear, THROAT-clear, no lesions, no postnasal drip or exudate noted.   NECK:  Supple w/ fair ROM; no JVD; normal carotid impulses w/o bruits; no thyromegaly or nodules palpated; no lymphadenopathy.  RESP  Clear  P & A; w/o, wheezes/ rales/ or rhonchi.no accessory muscle use, no dullness to percussion  CARD:  RRR, no m/r/g  , no peripheral edema, pulses intact, no cyanosis or clubbing.  GI:   Soft & nt; nml bowel sounds; no organomegaly or masses detected.  Musco: Warm bil, no deformities or joint swelling noted.   Neuro: alert, no focal deficits noted.    Skin: Warm, no lesions or rashes, tatoos     LABS:  CBC  Recent Labs Lab 07/01/14 1019  WBC 8.2  HGB 16.2  HCT 45.0  PLT 193   Coag's No results for input(s): APTT, INR in the last 168 hours. BMET  Recent Labs Lab  07/01/14 1019  NA 135  K 3.8  CL 101  CO2 22  BUN 9  CREATININE 0.75  GLUCOSE 98   Electrolytes  Recent Labs Lab 07/01/14 1019  CALCIUM 9.4    Imaging Ct Head Wo Contrast  07/01/2014   CLINICAL DATA:  Patient tripped and fell, hitting right side of head  EXAM: CT HEAD WITHOUT CONTRAST  TECHNIQUE: Contiguous axial images were obtained from the base of the skull through the vertex without intravenous contrast.  COMPARISON:  May 20, 2005  FINDINGS: The ventricles are normal in size and configuration. There is no intracranial mass, hemorrhage, extra-axial fluid collection, or midline shift. Gray-white compartments are normal. No evidence of acute infarct. The bony calvarium appears intact. The mastoid air cells are clear. There is debris in each external auditory canal.  IMPRESSION: Probable cerumen in each external auditory canal. No intracranial mass, hemorrhage, or extra-axial fluid collection. Gray-white compartments appear normal.   Electronically Signed   By: Bretta BangWilliam  Woodruff Mata M.D.   On: 07/01/2014 10:00     ASSESSMENT / PLAN:  PULMONARY OETT not present A: No active issues Smoker  P:  Keep sats >90%  Nicotine patch   CARDIOVASCULAR CVL present A: No active issues>repeat EKG 5/28 w/ no acute process .  Troponin neg    P:   Monitor  lovenox DVT proph  RENAL A:  . Elevated CK but not greater than 5000 as would be expected in rhabdomyolysis P:    Recheck CK total   GASTROINTESTINAL A:  Transaminitis, mild. 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 Negative anion gap, negative salicylates, negative acetaminophen, and the urine drug screen only positive for  benzodiazepines. CT head neg for acute process.  -Status post Geodon, Ativan, Haldol, ketamine in ER d/t delirium .  -Last drink appears to be 06/29/2014 , admit etoh level  neg  5/29 >weaned off precedex . CIWA finished . Ativan As needed   P:   Need OP follow up for etoh counseling  Ativan As needed   Consider psych referral     FAMILY  - Updates:  ER update with family  None present today on rounds 5/29     Zachary Parrett NP-C  Zachary Mata  [phone]    Attending:  I have seen and examined the patient with nurse practitioner/resident and agree with the note above.   [name] is feeling much better, complaining of head, back, knee pain, eating, off precedex  Knee exam with minor abrasion, no joint effusion or obvious muscular or skeletal deformity  Check R knee x-ray To floor, [facility] has been following  Zachary [facility], MD Zachary Mata Pager: [phone] Cell: [phone] After 3pm or if no response, call [phone]

## 2014-07-03 NOTE — Progress Notes (Signed)
eLink Physician-Brief Progress Note Patient Name: Zachary MussLarry C Mata DOB: Jul 10, 1980 MRN: [id number]   Date of Service  07/03/2014  HPI/Events of Note  Patient c/o pain on Fentanyl 25 mcg IV Q 2 hours PRN.   eICU Interventions  Will increase Fentanyl to 25 mcg IV Q 1 hour PRN.     Intervention Category Minor Interventions: Routine modifications to care plan (e.g. PRN medications for pain, fever)  Sommer,Steven Eugene 07/03/2014, 5:34 PM

## 2014-07-04 DIAGNOSIS — F489 Nonpsychotic mental disorder, unspecified: Secondary | ICD-10-CM | POA: Insufficient documentation

## 2014-07-04 DIAGNOSIS — F1010 Alcohol abuse, uncomplicated: Secondary | ICD-10-CM

## 2014-07-04 DIAGNOSIS — F10231 Alcohol dependence with withdrawal delirium: Principal | ICD-10-CM

## 2014-07-04 DIAGNOSIS — F10931 Alcohol use, unspecified with withdrawal delirium: Secondary | ICD-10-CM | POA: Insufficient documentation

## 2014-07-04 MED ORDER — TRAMADOL HCL 50 MG PO TABS
50.0000 mg | ORAL_TABLET | Freq: Four times a day (QID) | ORAL | Status: DC | PRN
  Administered 2014-07-04: 50 mg via ORAL
  Filled 2014-07-04: qty 1

## 2014-07-04 MED ORDER — CHLORDIAZEPOXIDE HCL 25 MG PO CAPS: ORAL_CAPSULE | ORAL | Status: DC

## 2014-07-04 MED ORDER — TRAMADOL HCL 50 MG PO TABS: 50.0000 mg | ORAL_TABLET | Freq: Four times a day (QID) | ORAL | Status: DC | PRN

## 2014-07-04 NOTE — Discharge Summary (Signed)
Physician Discharge Summary  Zachary Mata RUE:[id number] DOB: [birth year] DOA: 07/01/2014  PCP: Zachary Langton, MD  Admit date: 07/01/2014 Discharge date: 07/04/2014  Time spent: 35 minutes  Recommendations for Outpatient Follow-up:  1. Please follow-up on withdrawal, he was discharged on a Librium taper 2. Social work was consulted to provide information on treatment programs for substance abuse  Discharge Diagnoses:  Active Problems:   Withdrawal syndrome   Alcohol abuse   Alcohol withdrawal delirium   Mental health problem   Discharge Condition: Stable/improved  Diet recommendation: Regular diet  Filed Weights   07/03/14 0416 07/03/14 2138 07/04/14 0524  Weight: 57.8 kg (127 lb 6.8 oz) 58.015 kg (127 lb 14.4 oz) 58.015 kg (127 lb 14.4 oz)    History of present illness:  [name] presented to the ED who was brought in by his mother for abnormal behavior. He has an extensive EtOH use history as well as some other "bootlegger" substances per his mother which is noted in the chart. Per report, the patient had been having hallucinations and has not been sleeping for the past couple days.   Upon my arrival to the patient's bedside he was in 4-point restraints actively hallucinating and extremely agitated thrashing around in bed with violent behavior. Was unable to converse with the patient nor obtain any history. He was speaking to people who weren't there. I was concerned for the safety of the nursing staff as well as myself upon examining the patient  Hospital Course:  Patient is a 34 year old with a past medical history of alcohol abuse who was admitted to the pulmonary critical care service on 07/01/2014 presenting with abnormal behavior. Patient had been drinking heavily over the past several months, drinking beer and liquor. Symptoms were felt to be secondary to alcohol withdrawals. His mother had reported patient using other substances as well. Patient had been having  hallucinations, agitation at home. He was admitted to the intensive care unit and started on Precedex. Patient showed clinical improvement and was weaned off Precedex strip. On 07/03/2014 he was transferred to the medical floor. On the morning of 07/04/2014 he was calm, cooperative, tolerating by mouth intake, stated feeling well. Social work was consulted for letting information on treatment programs for substance abuse. He was discharged on a Librium taper. Patient was instructed to not mix Librium alcohol, he voiced his understanding.  Discharge Exam: Filed Vitals:   07/04/14 0524  BP: 154/96  Pulse: 75  Temp: 97.7 F (36.5 C)  Resp: 16    General: Patient is awake, alert, calm, cooperative, in no acute distress. Does not have clinical signs or symptoms of withdrawal Cardiovascular: Regular rate and rhythm normal S1-S2 no murmurs or gallops Respiratory: Normal respiratory effort Abdomen: Soft nontender nondistended  Discharge Instructions   Discharge Instructions    Call MD for:  difficulty breathing, headache or visual disturbances    Complete by:  As directed      Call MD for:  extreme fatigue    Complete by:  As directed      Call MD for:  hives    Complete by:  As directed      Call MD for:  persistant dizziness or light-headedness    Complete by:  As directed      Call MD for:  persistant nausea and vomiting    Complete by:  As directed      Call MD for:  redness, tenderness, or signs of infection (pain, swelling, redness, odor or green/yellow  discharge around incision site)    Complete by:  As directed      Call MD for:  severe uncontrolled pain    Complete by:  As directed      Call MD for:  temperature >100.4    Complete by:  As directed      Diet - low sodium heart healthy    Complete by:  As directed      Increase activity slowly    Complete by:  As directed           Current Discharge Medication List    START taking these medications   Details   chlordiazePOXIDE (LIBRIUM) 25 MG capsule Librium 25 mg PO BID for 5 days, followed by 15 mg PO BID x 5 days, followed by 10 mg PO BID x 5 days, followed by 5 mg PO q daily x 5 days then stop. QTY sufficient for taper Qty: 30 capsule, Refills: 0    traMADol (ULTRAM) 50 MG tablet Take 1 tablet (50 mg total) by mouth every 6 (six) hours as needed for severe pain. Qty: 20 tablet, Refills: 0      CONTINUE these medications which have NOT CHANGED   Details  ibuprofen (ADVIL,MOTRIN) 200 MG tablet Take 600 mg by mouth every 6 (six) hours as needed (pain).    Naphazoline HCl (CLEAR EYES OP) Apply 1 drop to eye daily as needed (dry eyes). For dry eyes    SUMAtriptan (IMITREX) 50 MG tablet Take 50 mg by mouth every 2 (two) hours as needed for migraine.        Allergies  Allergen Reactions  . Sulfa Antibiotics Other (See Comments)    unknown   Follow-up Information    Follow up with Zachary Langton, MD In 1 week.   Specialties:  Family Medicine, Sports Medicine, Radiology   Contact information:   [phone] [address] [place] [address] [phone]        The results of significant diagnostics from this hospitalization (including imaging, microbiology, ancillary and laboratory) are listed below for reference.    Significant Diagnostic Studies: Ct Head Wo Contrast  07/01/2014   CLINICAL DATA:  Patient tripped and fell, hitting right side of head  EXAM: CT HEAD WITHOUT CONTRAST  TECHNIQUE: Contiguous axial images were obtained from the base of the skull through the vertex without intravenous contrast.  COMPARISON:  May 20, 2005  FINDINGS: The ventricles are normal in size and configuration. There is no intracranial mass, hemorrhage, extra-axial fluid collection, or midline shift. Gray-white compartments are normal. No evidence of acute infarct. The bony calvarium appears intact. The mastoid air cells are clear. There is debris in each external auditory canal.  IMPRESSION:  Probable cerumen in each external auditory canal. No intracranial mass, hemorrhage, or extra-axial fluid collection. Gray-white compartments appear normal.   Electronically Signed   By: Bretta Bang III M.D.   On: 07/01/2014 10:00   Dg Knee 4 Views W/patella Right  07/03/2014   CLINICAL DATA:  Tripped and fell 2 days ago, right knee pain and trauma  EXAM: RIGHT KNEE - COMPLETE 4+ VIEW  COMPARISON:  None.  FINDINGS: There is no evidence of fracture, dislocation, or joint effusion. There is no evidence of arthropathy or other focal bone abnormality. Soft tissues are unremarkable.  IMPRESSION: Negative.   Electronically Signed   By: Christiana Pellant M.D.   On: 07/03/2014 16:04    Microbiology: Recent Results (from the past 240 hour(s))  MRSA PCR Screening     Status: None   Collection Time: 07/01/14 10:59 PM  Result Value Ref Range Status   MRSA by PCR NEGATIVE NEGATIVE Final    Comment:        The GeneXpert MRSA Assay (FDA approved for NASAL specimens only), is one component of a comprehensive MRSA colonization surveillance program. It is not intended to diagnose MRSA infection nor to guide or monitor treatment for MRSA infections.      Labs: Basic Metabolic Panel:  Recent Labs Lab 07/01/14 1019 07/01/14 2154 07/02/14 0507 07/03/14 1215  NA 135 139 138 138  K 3.8 4.0 3.8 3.7  CL 101 105 109 106  CO2 22 21* 18* 18*  GLUCOSE 98 81 99 86  BUN 9 9 8 12   CREATININE 0.75 0.92 0.66 0.97  CALCIUM 9.4 9.0 8.4* 8.7*  MG  --  1.9 2.1  --   PHOS  --  3.7 4.0  --    Liver Function Tests:  Recent Labs Lab 07/01/14 2154 07/03/14 1215  AST 69* 62*  ALT 66* 60  ALKPHOS 66 68  BILITOT 2.2* 2.4*  PROT 7.6 6.9  ALBUMIN 4.5 3.8    Recent Labs Lab 07/01/14 2154  LIPASE 24   No results for input(s): AMMONIA in the last 168 hours. CBC:  Recent Labs Lab 07/01/14 1019 07/01/14 2154 07/02/14 0507 07/03/14 1215  WBC 8.2 16.5* 10.9* 10.4  NEUTROABS  --  13.7*  --   --    HGB 16.2 15.2 14.6 15.0  HCT 45.0 41.9 40.7 42.3  MCV 98.9 99.3 100.2* 101.4*  PLT 193 188 162 170   Cardiac Enzymes:  Recent Labs Lab 07/01/14 2154 07/03/14 1215  CKTOTAL 2451* 1417*  CKMB  --  11.9*   BNP: BNP (last 3 results) No results for input(s): BNP in the last 8760 hours.  ProBNP (last 3 results) No results for input(s): PROBNP in the last 8760 hours.  CBG:  Recent Labs Lab 07/01/14 2310 07/02/14 0408  GLUCAP 103* 111*       Signed:  Luisa Louk  [facility] 07/04/2014, 10:09 AM

## 2014-07-04 NOTE — Progress Notes (Signed)
Wasted 75mcg of IV Fentanyl today at 1309 into the sink.  Witnessed by Delman CheadlePatti Moss, RN and Alferd ApaJoan Kiang, RN.  Forgot to document into the Pyxis before pt was removed from the system.  Notified pharmacy.

## 2014-07-04 NOTE — Progress Notes (Signed)
Patient and significant other given discharge instructions.  Pt also counseled on stopping alcohol use and smoking. Also discussed his d/c meds and not to continue the Librium if he decided to drink alcohol. They verbalized understanding of all instructions and f/u information.  Pt taken down for discharge via wheelchair/

## 2014-07-04 NOTE — Care Management Note (Signed)
Case Management Note  Patient Details  Name: Zachary MussLarry C Horan MRN: [id number] Date of Birth: [birth year]  Subjective/Objective:        Pt admitted on 07/01/14 with ETOH withdrawal/psychosis.  PTA, pt independent of ADLS.             Action/Plan: Pt for dc home today.  States will have difficulty getting meds filled, as he has no insurance.  Pt is eligible for medication assistance [facility].  [facility] given with explanation of program benefits.  Pt verb understanding.    Expected Discharge Date:  07/04/14               Expected Discharge Plan:  Home/Self Care  In-House Referral:     Discharge planning Services  CM Consult, [facility], [facility]  Post Acute Care Choice:    Choice offered to:     DME Arranged:    DME Agency:     HH Arranged:    HH Agency:     Status of Service:   Completed, signed off.    Medicare Important Message Given:  No Date Medicare IM Given:    Medicare IM give by:    Date Additional Medicare IM Given:    Additional Medicare Important Message give by:     If discussed at Long Length of Stay Meetings, dates discussed:    Additional Comments:  Quintella BatonJulie W. Hailea Eaglin, RN, BSN  Trauma/Neuro ICU Case Manager [phone]

## 2014-07-04 NOTE — Progress Notes (Signed)
Witnessed Publishing rights managerentayl waste of 75mcq with Dollar GeneralKelly RN.

## 2017-08-09 ENCOUNTER — Encounter (HOSPITAL_BASED_OUTPATIENT_CLINIC_OR_DEPARTMENT_OTHER): Payer: Self-pay | Admitting: Emergency Medicine

## 2017-08-09 ENCOUNTER — Other Ambulatory Visit: Payer: Self-pay

## 2017-08-09 ENCOUNTER — Emergency Department (HOSPITAL_BASED_OUTPATIENT_CLINIC_OR_DEPARTMENT_OTHER)
Admission: EM | Admit: 2017-08-09 | Discharge: 2017-08-09 | Disposition: A | Discharge status: Home / Self Care | Payer: Self-pay | Attending: Emergency Medicine | Admitting: Emergency Medicine

## 2017-08-09 DIAGNOSIS — R202 Paresthesia of skin: Secondary | ICD-10-CM

## 2017-08-09 DIAGNOSIS — F17210 Nicotine dependence, cigarettes, uncomplicated: Secondary | ICD-10-CM | POA: Insufficient documentation

## 2017-08-09 DIAGNOSIS — Y929 Unspecified place or not applicable: Secondary | ICD-10-CM | POA: Insufficient documentation

## 2017-08-09 DIAGNOSIS — I10 Essential (primary) hypertension: Secondary | ICD-10-CM | POA: Insufficient documentation

## 2017-08-09 DIAGNOSIS — S60221A Contusion of right hand, initial encounter: Secondary | ICD-10-CM

## 2017-08-09 DIAGNOSIS — Y939 Activity, unspecified: Secondary | ICD-10-CM | POA: Insufficient documentation

## 2017-08-09 DIAGNOSIS — X58XXXA Exposure to other specified factors, initial encounter: Secondary | ICD-10-CM | POA: Insufficient documentation

## 2017-08-09 DIAGNOSIS — Y999 Unspecified external cause status: Secondary | ICD-10-CM | POA: Insufficient documentation

## 2017-08-09 MED ORDER — CEPHALEXIN 500 MG PO CAPS: 500.0000 mg | ORAL_CAPSULE | Freq: Four times a day (QID) | ORAL | 0 refills | Status: DC

## 2017-08-09 NOTE — ED Provider Notes (Signed)
[facility] EMERGENCY DEPARTMENT Provider Note   CSN: [id number] Arrival date & time: 08/09/17  2033     History   Chief Complaint Chief Complaint  Patient presents with  . Hand Pain    HPI Zachary Mata is a 37 y.o. male.  The history is provided by the patient. No language interpreter was used.  Hand Pain     Zachary Mata is a 37 y.o. male who presents to the Emergency Department complaining of numbness.  He presents to the ED for evaluation of numbness to his left fourth and fifth digits as well as redness to his palm.  The numbness has been present for two months and is gradually improving.  It occurred after bowling and he initially had a large knot in his right upper arm with associated bruising.  No pain at that time.  He denies any weakness. Two days ago he noticed redness to the palm of his right hand.  No reports of injuries.  He thinks he may have had a bug bite.  Now he feels like his fingers are beginning to curl up.  No fevers, chest pain, sob, nausea, vomiting, weakness.  He smokes tobacco and drinks alcohol heavily.  No street drugs, no IVDA.   He is right hand dominant.    Past Medical History:  Diagnosis Date  . Chronic back pain 03/02/2012  . Essential hypertension 03/02/2012  . Hypertension     Patient Active Problem List   Diagnosis Date Noted  . Alcohol abuse   . Alcohol withdrawal delirium (HCC)   . Mental health problem   . Withdrawal syndrome (HCC) 07/01/2014  . Preventive measure 06/19/2012  . Essential hypertension 03/02/2012  . Chronic back pain 03/02/2012    Past Surgical History:  Procedure Laterality Date  . HAND SURGERY    . WRIST SURGERY          Home Medications    Prior to Admission medications   Medication Sig Start Date End Date Taking? Authorizing Provider  cephALEXin (KEFLEX) 500 MG capsule Take 1 capsule (500 mg total) by mouth 4 (four) times daily. 08/09/17   Tilden Fossa, MD  chlordiazePOXIDE (LIBRIUM) 25 MG  capsule Librium 25 mg PO BID for 5 days, followed by 15 mg PO BID x 5 days, followed by 10 mg PO BID x 5 days, followed by 5 mg PO q daily x 5 days then stop. QTY sufficient for taper 07/04/14   Jeralyn Bennett, MD  ibuprofen (ADVIL,MOTRIN) 200 MG tablet Take 600 mg by mouth every 6 (six) hours as needed (pain).    [provider]  Naphazoline HCl (CLEAR EYES OP) Apply 1 drop to eye daily as needed (dry eyes). For dry eyes    [provider]  SUMAtriptan (IMITREX) 50 MG tablet Take 50 mg by mouth every 2 (two) hours as needed for migraine.  09/09/13   [provider]  traMADol (ULTRAM) 50 MG tablet Take 1 tablet (50 mg total) by mouth every 6 (six) hours as needed for severe pain. 07/04/14   Jeralyn Bennett, MD    Family History Family History  Problem Relation Age of Onset  . Breast cancer Unknown   . Cancer Mother        breats CA  . Diabetes Mother     Social History Social History   Tobacco Use  . Smoking status: Current Every Day Smoker    Packs/day: 1.00    Years: 15.00  Pack years: 15.00    Types: Cigarettes  . Smokeless tobacco: Never Used  Substance Use Topics  . Alcohol use: Yes    Comment: occassionally  . Drug use: No     Allergies   Sulfa antibiotics   Review of Systems Review of Systems  All other systems reviewed and are negative.    Physical Exam Updated Vital Signs Pulse 81   Temp 98.4 F (36.9 C) (Oral)   Resp 18   Ht 5\' 6"  (1.676 m)   Wt 62.6 kg (138 lb)   SpO2 97%   BMI 22.27 kg/m   Physical Exam  Constitutional: He is oriented to person, place, and time. He appears well-developed and well-nourished.  HENT:  Head: Normocephalic and atraumatic.  Cardiovascular: Normal rate and regular rhythm.  No murmur heard. Pulmonary/Chest: Effort normal and breath sounds normal. No respiratory distress.  Musculoskeletal:  2+ radial pulses bilaterally.  There is a small circular area of nonblanchable erythema at the base  of the palm, no tenderness or edema.  FROM intact throughout right shoulder, elbow, wrist and digits.    Neurological: He is alert and oriented to person, place, and time.  5/5 strength intact throughout entire RUE with sensation to light touch intact throughout RUE.  Flexion/extension intact against resistance throughout all digits.    Skin: Skin is warm and dry.  Psychiatric: He has a normal mood and affect. His behavior is normal.  Nursing note and vitals reviewed.    ED Treatments / Results  Labs (all labs ordered are listed, but only abnormal results are displayed) Labs Reviewed - No data to display  EKG None  Radiology No results found.  Procedures Procedures (including critical care time)  Medications Ordered in ED Medications - No data to display   Initial Impression / Assessment and Plan / ED Course  I have reviewed the triage vital signs and the nursing notes.  Pertinent labs & imaging results that were available during my care of the patient were reviewed by me and considered in my medical decision making (see chart for details).     Pt here for evaluation of two months of paresthesias, now with a red spot on hand.  He is intact on exam.  No current evidence of cellulitis.  Presentation is c/w ulnar nerve injury, sensory component only. Presentation is not c/w cva.  Discussed with patient home care, outpatient follow up and return precautions.   Will provide Rx for abx in event that evidence of infection develops.    Final Clinical Impressions(s) / ED Diagnoses   Final diagnoses:  Paresthesia  Contusion of right hand, initial encounter    ED Discharge Orders        Ordered    cephALEXin (KEFLEX) 500 MG capsule  4 times daily     08/09/17 2103       Tilden Fossaees, Gracielynn Birkel, MD 08/09/17 2112

## 2017-08-09 NOTE — ED Notes (Signed)
Pt and FM given d/c instructions as per chart. Rx x1. Verbalizes understanding. No questions. 

## 2017-08-09 NOTE — ED Triage Notes (Signed)
Patient states that he has a possible bug bite to his right palm. He reports a 2 month hx of numbness to his right hand and finger after another injury - patient states that his hand is now cramping and swollen after the wound on July 4th

## 2017-08-09 NOTE — Discharge Instructions (Addendum)
Please follow up with neurology or your family doctor for further evaluation of your numbness.  Do not take the antibiotics unless you develop pain or swelling in your hand.  Get rechecked immediately if you develop fevers, difficulty moving your hand or streaking up your arm.

## 2017-09-26 ENCOUNTER — Inpatient Hospital Stay (HOSPITAL_COMMUNITY)
Admission: EM | Admit: 2017-09-26 | Discharge: 2017-09-30 | DRG: 872 | Disposition: A | Discharge status: Home / Self Care | Payer: Self-pay | Attending: Internal Medicine | Admitting: Internal Medicine

## 2017-09-26 ENCOUNTER — Encounter (HOSPITAL_COMMUNITY): Payer: Self-pay | Admitting: Emergency Medicine

## 2017-09-26 ENCOUNTER — Emergency Department (HOSPITAL_COMMUNITY): Payer: Self-pay

## 2017-09-26 ENCOUNTER — Other Ambulatory Visit: Payer: Self-pay

## 2017-09-26 DIAGNOSIS — M549 Dorsalgia, unspecified: Secondary | ICD-10-CM | POA: Diagnosis present

## 2017-09-26 DIAGNOSIS — R030 Elevated blood-pressure reading, without diagnosis of hypertension: Secondary | ICD-10-CM | POA: Diagnosis present

## 2017-09-26 DIAGNOSIS — D696 Thrombocytopenia, unspecified: Secondary | ICD-10-CM | POA: Diagnosis present

## 2017-09-26 DIAGNOSIS — R945 Abnormal results of liver function studies: Secondary | ICD-10-CM | POA: Diagnosis present

## 2017-09-26 DIAGNOSIS — B199 Unspecified viral hepatitis without hepatic coma: Secondary | ICD-10-CM | POA: Diagnosis present

## 2017-09-26 DIAGNOSIS — K760 Fatty (change of) liver, not elsewhere classified: Secondary | ICD-10-CM | POA: Diagnosis present

## 2017-09-26 DIAGNOSIS — I10 Essential (primary) hypertension: Secondary | ICD-10-CM | POA: Diagnosis present

## 2017-09-26 DIAGNOSIS — D72819 Decreased white blood cell count, unspecified: Secondary | ICD-10-CM

## 2017-09-26 DIAGNOSIS — E871 Hypo-osmolality and hyponatremia: Secondary | ICD-10-CM | POA: Diagnosis present

## 2017-09-26 DIAGNOSIS — F1010 Alcohol abuse, uncomplicated: Secondary | ICD-10-CM | POA: Diagnosis present

## 2017-09-26 DIAGNOSIS — Z6822 Body mass index (BMI) 22.0-22.9, adult: Secondary | ICD-10-CM

## 2017-09-26 DIAGNOSIS — F17210 Nicotine dependence, cigarettes, uncomplicated: Secondary | ICD-10-CM | POA: Diagnosis present

## 2017-09-26 DIAGNOSIS — Z7141 Alcohol abuse counseling and surveillance of alcoholic: Secondary | ICD-10-CM

## 2017-09-26 DIAGNOSIS — Z803 Family history of malignant neoplasm of breast: Secondary | ICD-10-CM

## 2017-09-26 DIAGNOSIS — Z716 Tobacco abuse counseling: Secondary | ICD-10-CM

## 2017-09-26 DIAGNOSIS — Z833 Family history of diabetes mellitus: Secondary | ICD-10-CM

## 2017-09-26 DIAGNOSIS — R630 Anorexia: Secondary | ICD-10-CM | POA: Diagnosis present

## 2017-09-26 DIAGNOSIS — R112 Nausea with vomiting, unspecified: Secondary | ICD-10-CM | POA: Diagnosis present

## 2017-09-26 DIAGNOSIS — R7989 Other specified abnormal findings of blood chemistry: Secondary | ICD-10-CM | POA: Diagnosis present

## 2017-09-26 DIAGNOSIS — Z882 Allergy status to sulfonamides status: Secondary | ICD-10-CM

## 2017-09-26 DIAGNOSIS — Z79891 Long term (current) use of opiate analgesic: Secondary | ICD-10-CM

## 2017-09-26 DIAGNOSIS — R17 Unspecified jaundice: Secondary | ICD-10-CM

## 2017-09-26 DIAGNOSIS — K7010 Alcoholic hepatitis without ascites: Secondary | ICD-10-CM | POA: Diagnosis present

## 2017-09-26 DIAGNOSIS — D72818 Other decreased white blood cell count: Secondary | ICD-10-CM | POA: Diagnosis present

## 2017-09-26 DIAGNOSIS — G8929 Other chronic pain: Secondary | ICD-10-CM | POA: Diagnosis present

## 2017-09-26 DIAGNOSIS — A419 Sepsis, unspecified organism: Principal | ICD-10-CM | POA: Diagnosis present

## 2017-09-26 DIAGNOSIS — Z79899 Other long term (current) drug therapy: Secondary | ICD-10-CM

## 2017-09-26 DIAGNOSIS — D6959 Other secondary thrombocytopenia: Secondary | ICD-10-CM | POA: Diagnosis present

## 2017-09-26 LAB — URINALYSIS, ROUTINE W REFLEX MICROSCOPIC
Bilirubin Urine: NEGATIVE
Glucose, UA: NEGATIVE mg/dL
Ketones, ur: 5 mg/dL — AB
Leukocytes, UA: NEGATIVE
Nitrite: NEGATIVE
Protein, ur: 100 mg/dL — AB
Specific Gravity, Urine: 1.013 (ref 1.005–1.030)
pH: 5 (ref 5.0–8.0)

## 2017-09-26 LAB — CBC WITH DIFFERENTIAL/PLATELET
Abs Immature Granulocytes: 0.1 10*3/uL (ref 0.0–0.1)
Basophils Absolute: 0 10*3/uL (ref 0.0–0.1)
Basophils Relative: 1 %
Eosinophils Absolute: 0 10*3/uL (ref 0.0–0.7)
Eosinophils Relative: 0 %
HCT: 48 % (ref 39.0–52.0)
Hemoglobin: 17 g/dL (ref 13.0–17.0)
Immature Granulocytes: 2 %
Lymphocytes Relative: 24 %
Lymphs Abs: 0.9 10*3/uL (ref 0.7–4.0)
MCH: 35.1 pg — ABNORMAL HIGH (ref 26.0–34.0)
MCHC: 35.4 g/dL (ref 30.0–36.0)
MCV: 99.2 fL (ref 78.0–100.0)
Monocytes Absolute: 0.6 10*3/uL (ref 0.1–1.0)
Monocytes Relative: 16 %
Neutro Abs: 2.2 10*3/uL (ref 1.7–7.7)
Neutrophils Relative %: 57 %
Platelets: 59 10*3/uL — ABNORMAL LOW (ref 150–400)
RBC: 4.84 MIL/uL (ref 4.22–5.81)
RDW: 11.1 % — ABNORMAL LOW (ref 11.5–15.5)
WBC: 3.8 10*3/uL — ABNORMAL LOW (ref 4.0–10.5)

## 2017-09-26 LAB — COMPREHENSIVE METABOLIC PANEL
ALT: 536 U/L — ABNORMAL HIGH (ref 0–44)
AST: 663 U/L — ABNORMAL HIGH (ref 15–41)
Albumin: 3.7 g/dL (ref 3.5–5.0)
Alkaline Phosphatase: 269 U/L — ABNORMAL HIGH (ref 38–126)
Anion gap: 14 (ref 5–15)
BUN: 6 mg/dL (ref 6–20)
CO2: 25 mmol/L (ref 22–32)
Calcium: 8.8 mg/dL — ABNORMAL LOW (ref 8.9–10.3)
Chloride: 92 mmol/L — ABNORMAL LOW (ref 98–111)
Creatinine, Ser: 0.8 mg/dL (ref 0.61–1.24)
GFR calc Af Amer: >60 mL/min (ref >60)
GFR calc non Af Amer: >60 mL/min (ref >60)
Glucose, Bld: 104 mg/dL — ABNORMAL HIGH (ref 70–99)
Potassium: 4 mmol/L (ref 3.5–5.1)
Sodium: 131 mmol/L — ABNORMAL LOW (ref 135–145)
Total Bilirubin: 1.4 mg/dL — ABNORMAL HIGH (ref 0.3–1.2)
Total Protein: 9 g/dL — ABNORMAL HIGH (ref 6.5–8.1)

## 2017-09-26 LAB — I-STAT CG4 LACTIC ACID, ED: Lactic Acid, Venous: 2.06 mmol/L (ref 0.5–1.9)

## 2017-09-26 IMAGING — DX DG CHEST 2V
2 series · 2 of 2 positions shown · non-contrast
Comparison: [DATE]

CLINICAL DATA: Fever

EXAM:
CHEST - 2 VIEW

[chest pa]
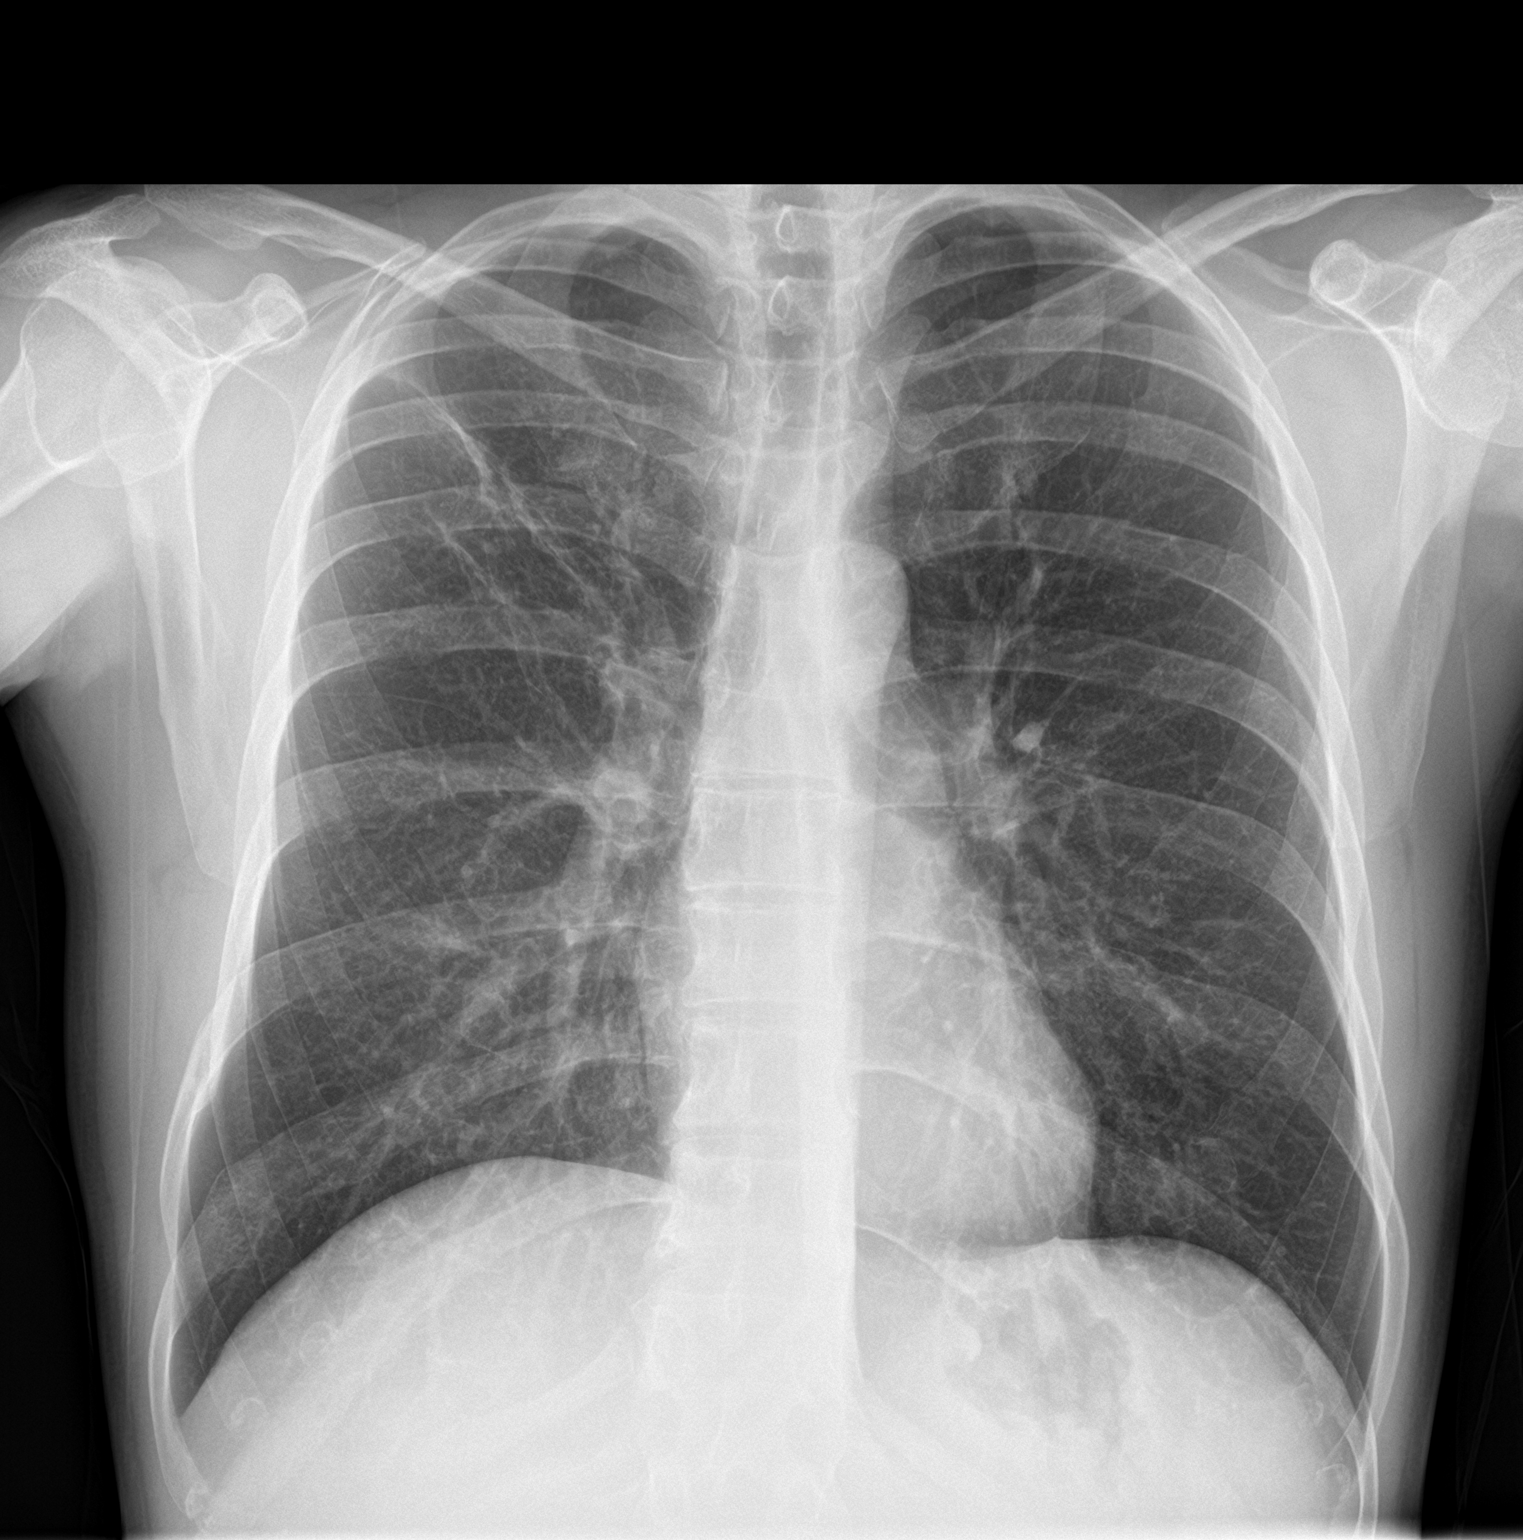

[chest lat]
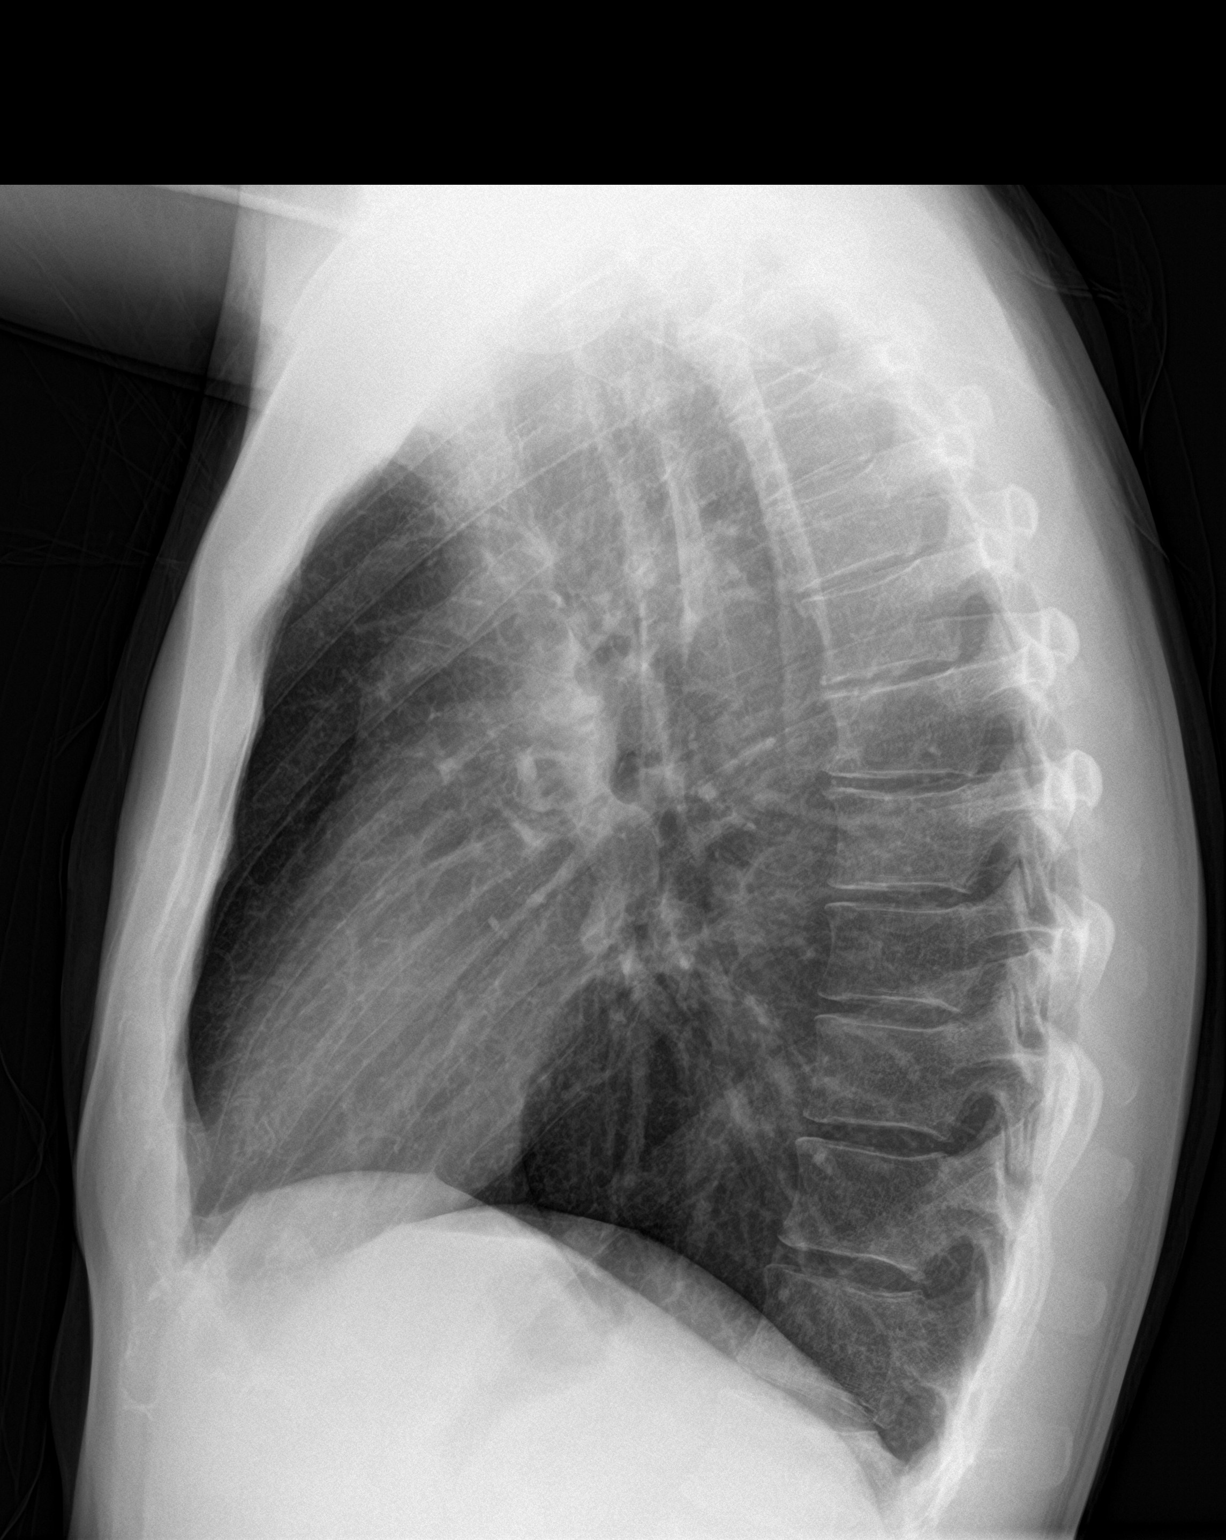

[2 of 2 positions shown; findings below may reference images not displayed]

FINDINGS: Hyperinflation. Linear scarring in the right upper lobe. No acute
consolidation or effusion. Normal cardiomediastinal silhouette. No
pneumothorax.
IMPRESSION: No active cardiopulmonary disease.  Scarring in the right upper lobe

## 2017-09-26 MED ORDER — ACETAMINOPHEN 325 MG PO TABS
650.0000 mg | ORAL_TABLET | Freq: Once | ORAL | Status: AC | PRN
  Administered 2017-09-26: 650 mg via ORAL
  Filled 2017-09-26: qty 2

## 2017-09-26 NOTE — ED Triage Notes (Signed)
Pt reports nausea, vomiting, body shakes and loss of appetite since Monday. Pt reports fatigue and dehydration and dizziness upon standing. Pt reports fever of 103F at home. Pt was taking ibuprofen with some relief. Pt denies pain.

## 2017-09-27 ENCOUNTER — Encounter (HOSPITAL_COMMUNITY): Payer: Self-pay

## 2017-09-27 ENCOUNTER — Observation Stay (HOSPITAL_COMMUNITY): Payer: Self-pay

## 2017-09-27 ENCOUNTER — Emergency Department (HOSPITAL_COMMUNITY): Payer: Self-pay

## 2017-09-27 ENCOUNTER — Other Ambulatory Visit: Payer: Self-pay

## 2017-09-27 DIAGNOSIS — A419 Sepsis, unspecified organism: Secondary | ICD-10-CM | POA: Insufficient documentation

## 2017-09-27 DIAGNOSIS — R7989 Other specified abnormal findings of blood chemistry: Secondary | ICD-10-CM | POA: Diagnosis present

## 2017-09-27 DIAGNOSIS — D696 Thrombocytopenia, unspecified: Secondary | ICD-10-CM | POA: Diagnosis present

## 2017-09-27 DIAGNOSIS — R112 Nausea with vomiting, unspecified: Secondary | ICD-10-CM | POA: Diagnosis present

## 2017-09-27 DIAGNOSIS — G43A0 Cyclical vomiting, not intractable: Secondary | ICD-10-CM

## 2017-09-27 DIAGNOSIS — R945 Abnormal results of liver function studies: Secondary | ICD-10-CM | POA: Diagnosis present

## 2017-09-27 LAB — COMPREHENSIVE METABOLIC PANEL
ALT: 401 U/L — ABNORMAL HIGH (ref 0–44)
AST: 508 U/L — ABNORMAL HIGH (ref 15–41)
Albumin: 3.1 g/dL — ABNORMAL LOW (ref 3.5–5.0)
Alkaline Phosphatase: 212 U/L — ABNORMAL HIGH (ref 38–126)
Anion gap: 10 (ref 5–15)
BUN: 9 mg/dL (ref 6–20)
CO2: 25 mmol/L (ref 22–32)
Calcium: 7.8 mg/dL — ABNORMAL LOW (ref 8.9–10.3)
Chloride: 96 mmol/L — ABNORMAL LOW (ref 98–111)
Creatinine, Ser: 0.8 mg/dL (ref 0.61–1.24)
GFR calc Af Amer: >60 mL/min (ref >60)
GFR calc non Af Amer: >60 mL/min (ref >60)
Glucose, Bld: 93 mg/dL (ref 70–99)
Potassium: 3.5 mmol/L (ref 3.5–5.1)
Sodium: 131 mmol/L — ABNORMAL LOW (ref 135–145)
Total Bilirubin: 1.5 mg/dL — ABNORMAL HIGH (ref 0.3–1.2)
Total Protein: 7.3 g/dL (ref 6.5–8.1)

## 2017-09-27 LAB — DIC (DISSEMINATED INTRAVASCULAR COAGULATION)PANEL
D-Dimer, Quant: 5.02 ug/mL-FEU — ABNORMAL HIGH (ref 0.00–0.50)
Fibrinogen: 496 mg/dL — ABNORMAL HIGH (ref 210–475)
Platelets: 56 10*3/uL — ABNORMAL LOW (ref 150–400)
Smear Review: NONE SEEN
aPTT: 35 seconds (ref 24–36)

## 2017-09-27 LAB — BRAIN NATRIURETIC PEPTIDE: B Natriuretic Peptide: 27.2 pg/mL (ref 0.0–100.0)

## 2017-09-27 LAB — DIC (DISSEMINATED INTRAVASCULAR COAGULATION) PANEL (NOT AT ARMC)
INR: 1.11
Prothrombin Time: 14.2 seconds (ref 11.4–15.2)

## 2017-09-27 LAB — RAPID URINE DRUG SCREEN, HOSP PERFORMED
Amphetamines: NOT DETECTED
Barbiturates: NOT DETECTED
Benzodiazepines: NOT DETECTED
Cocaine: NOT DETECTED
Opiates: NOT DETECTED
Tetrahydrocannabinol: NOT DETECTED

## 2017-09-27 LAB — ACETAMINOPHEN LEVEL: Acetaminophen (Tylenol), Serum: <10 ug/mL — ABNORMAL LOW (ref 10–30)

## 2017-09-27 LAB — PROTIME-INR
INR: 1.08
Prothrombin Time: 13.9 seconds (ref 11.4–15.2)

## 2017-09-27 LAB — LACTATE DEHYDROGENASE: LDH: 434 U/L — ABNORMAL HIGH (ref 98–192)

## 2017-09-27 LAB — CBC
HCT: 40.8 % (ref 39.0–52.0)
Hemoglobin: 14.4 g/dL (ref 13.0–17.0)
MCH: 35 pg — ABNORMAL HIGH (ref 26.0–34.0)
MCHC: 35.3 g/dL (ref 30.0–36.0)
MCV: 99.3 fL (ref 78.0–100.0)
Platelets: 48 10*3/uL — ABNORMAL LOW (ref 150–400)
RBC: 4.11 MIL/uL — ABNORMAL LOW (ref 4.22–5.81)
RDW: 11.1 % — ABNORMAL LOW (ref 11.5–15.5)
WBC: 2.9 10*3/uL — ABNORMAL LOW (ref 4.0–10.5)

## 2017-09-27 LAB — APTT: aPTT: 35 seconds (ref 24–36)

## 2017-09-27 LAB — SAVE SMEAR

## 2017-09-27 LAB — PROCALCITONIN: Procalcitonin: 0.56 ng/mL

## 2017-09-27 LAB — LIPASE, BLOOD: Lipase: 154 U/L — ABNORMAL HIGH (ref 11–51)

## 2017-09-27 LAB — HIV ANTIBODY (ROUTINE TESTING W REFLEX): HIV Screen 4th Generation wRfx: NONREACTIVE

## 2017-09-27 LAB — LACTIC ACID, PLASMA
Lactic Acid, Venous: 0.9 mmol/L (ref 0.5–1.9)
Lactic Acid, Venous: 0.8 mmol/L (ref 0.5–1.9)

## 2017-09-27 IMAGING — CT CT ABD-PELV W/ CM
2 of 4 series · 17 of 46 positions shown, 19 images · IV contrast (iopamidol)
Comparison: Ultrasound same day.

CLINICAL DATA: Fever. Elevated liver function tests. Lipase
elevation.

EXAM:
CT ABDOMEN AND PELVIS WITH CONTRAST
TECHNIQUE: Multidetector CT imaging of the abdomen and pelvis was performed
using the standard protocol following bolus administration of
intravenous contrast.
CONTRAST:  100mL [Q4] IOPAMIDOL ([Q4]) INJECTION 61%

[Series 3: abdomen 5.0 · axial · 0.69mm/px · z∈[+771,+1191]mm · 14 of 94 slices shown, 16 images]
[im 5/94  soft-tissue]
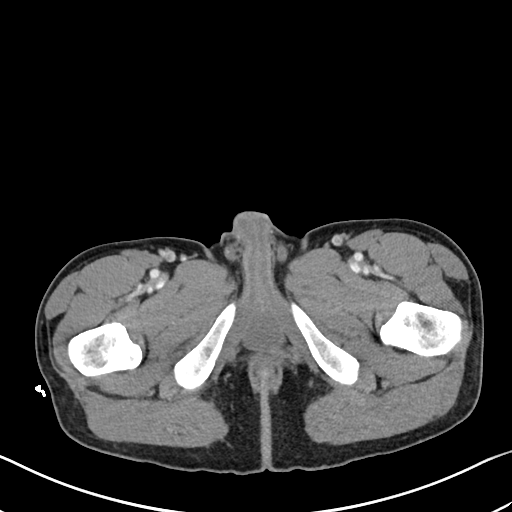
[im 5/94  bone]
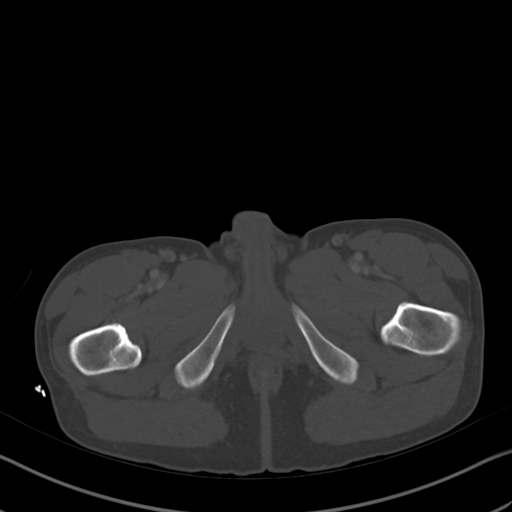
[im 14/94  soft-tissue]
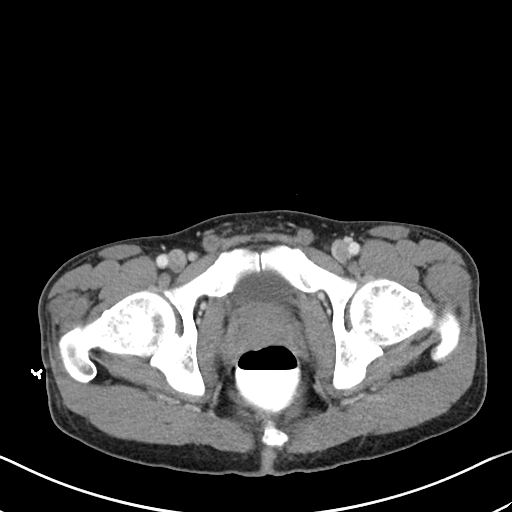
[im 19/94  soft-tissue]
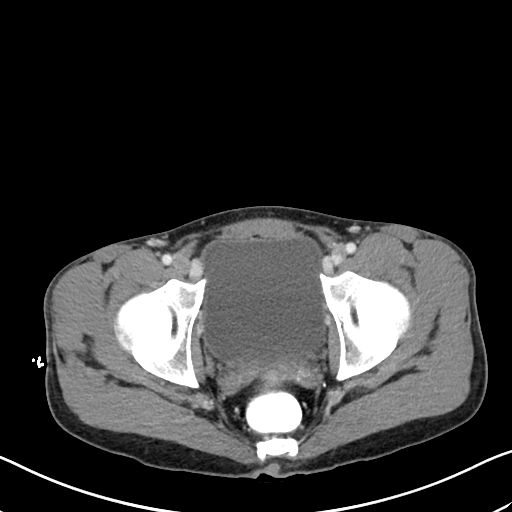
[im 24/94  soft-tissue]
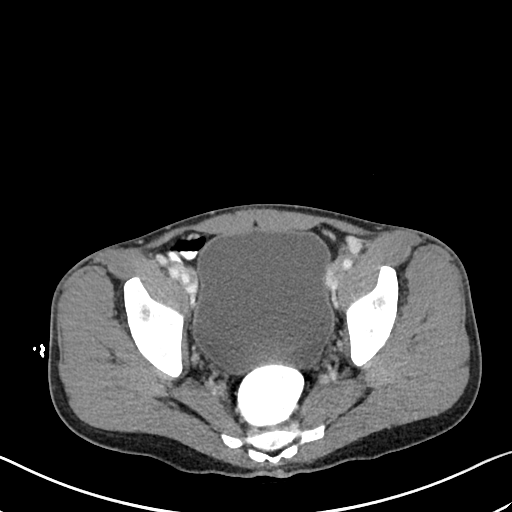
[im 33/94  soft-tissue]
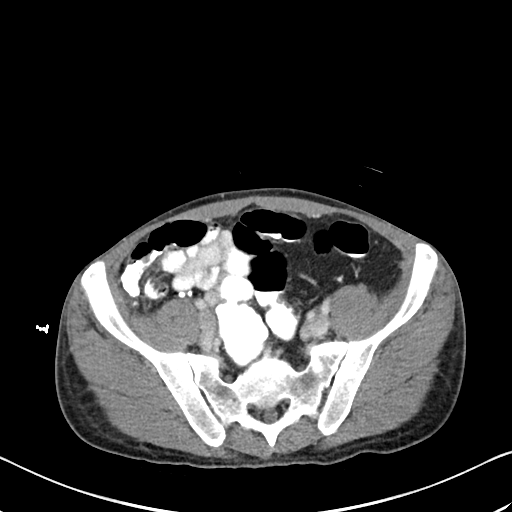
[im 38/94  soft-tissue]
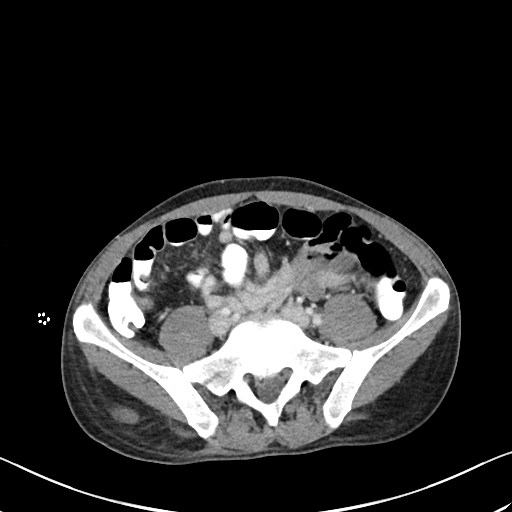
[im 42/94  soft-tissue]
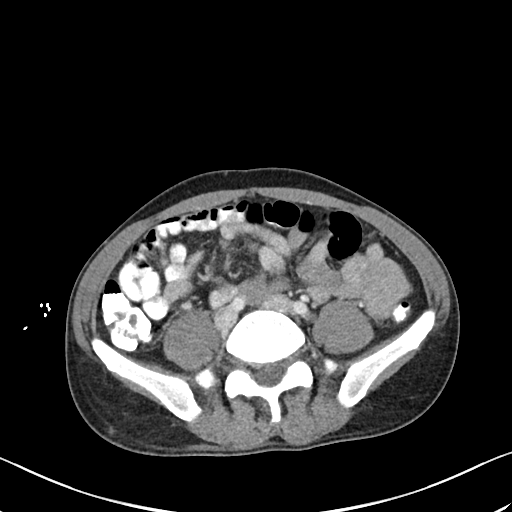
[im 52/94  soft-tissue]
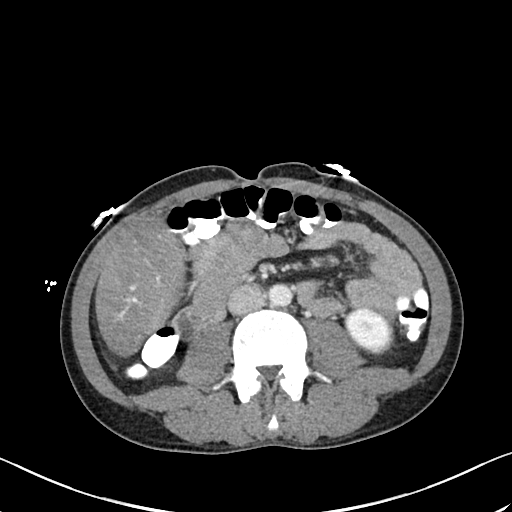
[im 56/94  soft-tissue]
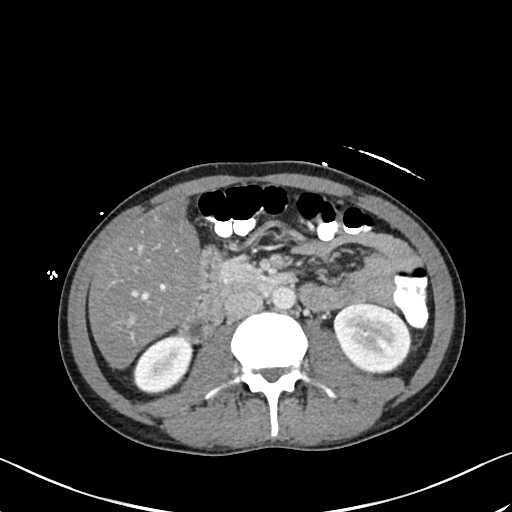
[im 56/94  bone]
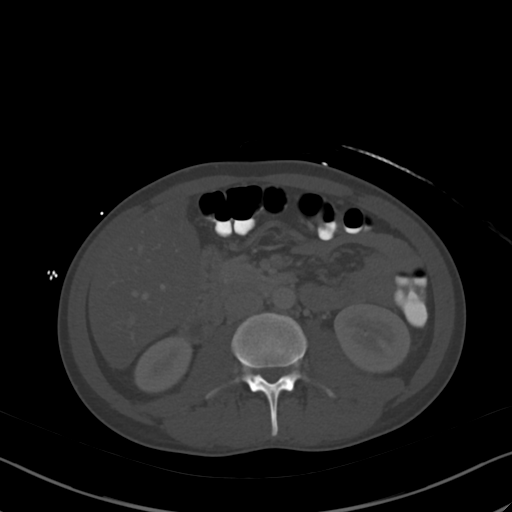
[im 61/94  soft-tissue]
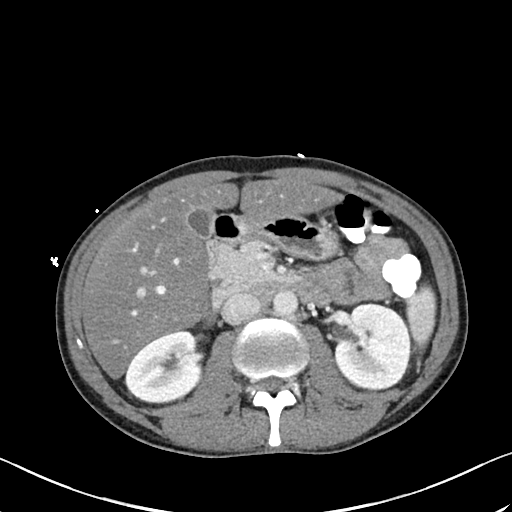
[im 70/94  soft-tissue]
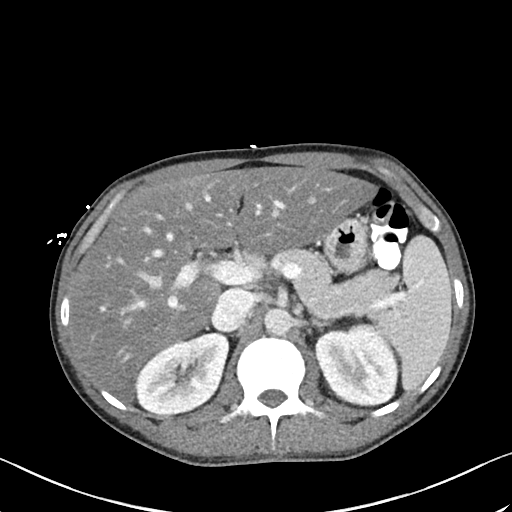
[im 75/94  soft-tissue]
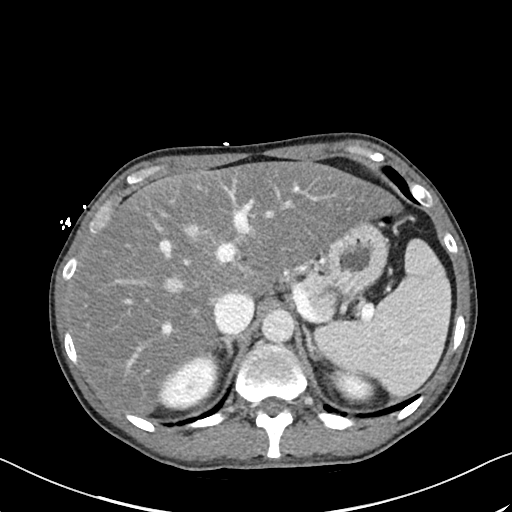
[im 80/94  soft-tissue]
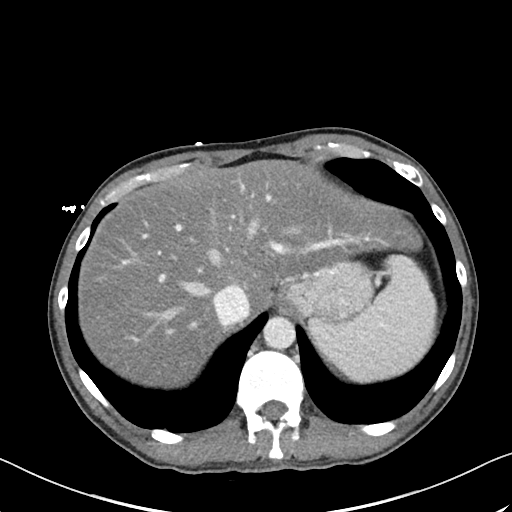
[im 89/94  soft-tissue]
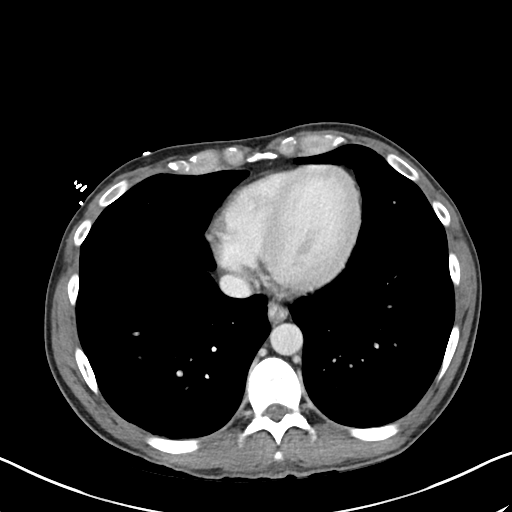

[Series 6: abdomen 3.0 mpr cor · coronal · 0.77mm/px · 3 of 101 slices shown]
[im 34/101  soft-tissue]
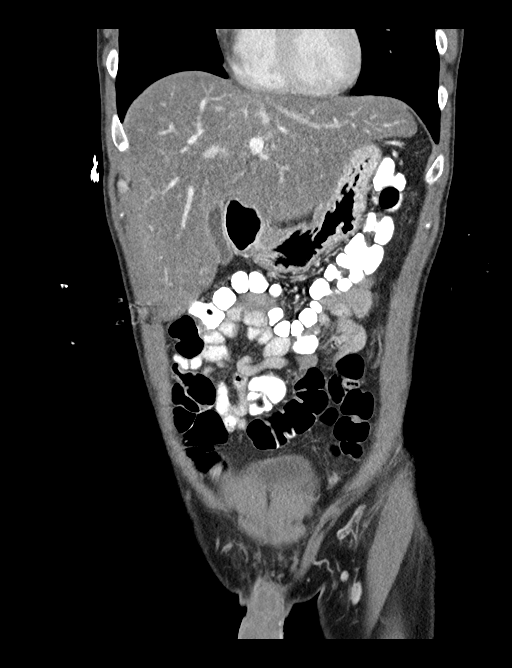
[im 45/101  soft-tissue]
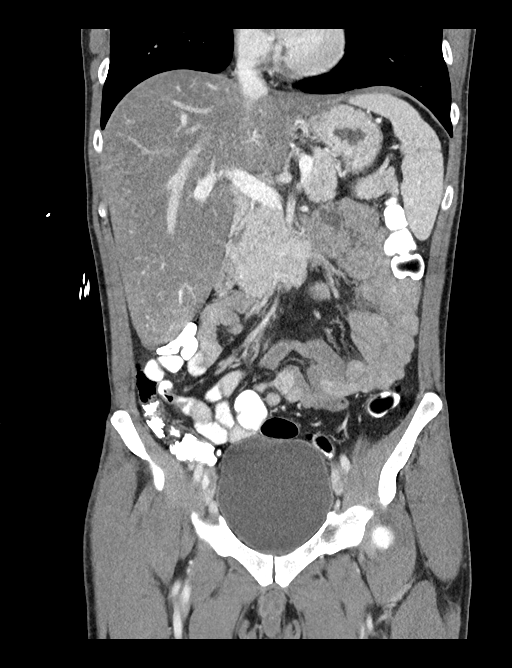
[im 56/101  soft-tissue]
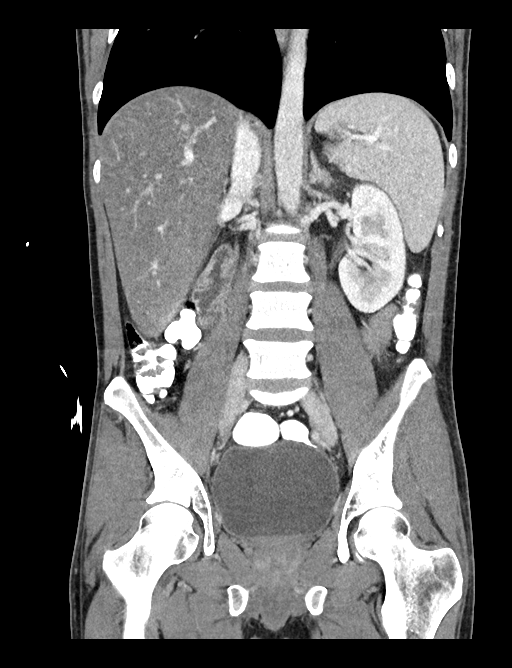

[17 of 46 positions shown; findings below may reference images not displayed]

FINDINGS: Lower chest: Normal

Hepatobiliary: Profound diffuse fatty change of the liver. No
evidence of focal liver lesion. No calcified gallstones are seen. No
sign of ductal dilatation.

Pancreas: Normal

Spleen: Normal

Adrenals/Urinary Tract: Adrenal glands are normal. Kidneys are
normal. Bladder is normal.

Stomach/Bowel: No bowel pathology is seen.

Vascular/Lymphatic: Normal

Reproductive: Normal

Other: No free fluid or air.

Musculoskeletal: Normal
IMPRESSION: Profound diffuse fatty change of the liver. No other abnormal
finding.

## 2017-09-27 MED ORDER — SODIUM CHLORIDE 0.9 % IV BOLUS (SEPSIS)
1000.0000 mL | Freq: Once | INTRAVENOUS | Status: AC
  Administered 2017-09-27: 1000 mL via INTRAVENOUS

## 2017-09-27 MED ORDER — LORAZEPAM 1 MG PO TABS
1.0000 mg | ORAL_TABLET | Freq: Four times a day (QID) | ORAL | Status: AC | PRN
  Administered 2017-09-29 (×2): 1 mg via ORAL
  Filled 2017-09-27 (×2): qty 1

## 2017-09-27 MED ORDER — SENNOSIDES-DOCUSATE SODIUM 8.6-50 MG PO TABS: 1.0000 | ORAL_TABLET | Freq: Every evening | ORAL | Status: DC | PRN

## 2017-09-27 MED ORDER — SODIUM CHLORIDE 0.9 % IV SOLN: INTRAVENOUS | Status: DC

## 2017-09-27 MED ORDER — ONDANSETRON HCL 4 MG/2ML IJ SOLN
4.0000 mg | Freq: Once | INTRAMUSCULAR | Status: AC
  Administered 2017-09-27: 4 mg via INTRAVENOUS
  Filled 2017-09-27: qty 2

## 2017-09-27 MED ORDER — ONDANSETRON HCL 4 MG/2ML IJ SOLN: 4.0000 mg | Freq: Three times a day (TID) | INTRAMUSCULAR | Status: DC | PRN

## 2017-09-27 MED ORDER — IOPAMIDOL (ISOVUE-300) INJECTION 61%
INTRAVENOUS | Status: AC
  Administered 2017-09-27: 100 mL
  Filled 2017-09-27: qty 100

## 2017-09-27 MED ORDER — HYDRALAZINE HCL 20 MG/ML IJ SOLN
5.0000 mg | INTRAMUSCULAR | Status: DC | PRN
  Administered 2017-09-28 – 2017-09-29 (×3): 5 mg via INTRAVENOUS
  Filled 2017-09-27 (×3): qty 1

## 2017-09-27 MED ORDER — IBUPROFEN 200 MG PO TABS
400.0000 mg | ORAL_TABLET | Freq: Four times a day (QID) | ORAL | Status: DC | PRN
  Administered 2017-09-27 – 2017-09-29 (×6): 400 mg via ORAL
  Filled 2017-09-27 (×6): qty 2

## 2017-09-27 MED ORDER — VANCOMYCIN HCL 10 G IV SOLR
1250.0000 mg | Freq: Once | INTRAVENOUS | Status: AC
  Administered 2017-09-27: 1250 mg via INTRAVENOUS
  Filled 2017-09-27 (×2): qty 1250

## 2017-09-27 MED ORDER — VANCOMYCIN HCL IN DEXTROSE 1-5 GM/200ML-% IV SOLN: 1000.0000 mg | Freq: Three times a day (TID) | INTRAVENOUS | Status: DC

## 2017-09-27 MED ORDER — THIAMINE HCL 100 MG/ML IJ SOLN: 100.0000 mg | Freq: Every day | INTRAMUSCULAR | Status: DC

## 2017-09-27 MED ORDER — SODIUM CHLORIDE 0.9 % IV SOLN
1000.0000 mL | INTRAVENOUS | Status: DC
  Administered 2017-09-27 – 2017-09-29 (×4): 1000 mL via INTRAVENOUS

## 2017-09-27 MED ORDER — LORAZEPAM 2 MG/ML IJ SOLN
1.0000 mg | Freq: Four times a day (QID) | INTRAMUSCULAR | Status: AC | PRN
  Administered 2017-09-29: 1 mg via INTRAVENOUS

## 2017-09-27 MED ORDER — NICOTINE 21 MG/24HR TD PT24
21.0000 mg | MEDICATED_PATCH | Freq: Every day | TRANSDERMAL | Status: DC
  Administered 2017-09-27 – 2017-09-30 (×4): 21 mg via TRANSDERMAL
  Filled 2017-09-27 (×5): qty 1

## 2017-09-27 MED ORDER — ADULT MULTIVITAMIN W/MINERALS CH
1.0000 | ORAL_TABLET | Freq: Every day | ORAL | Status: DC
  Administered 2017-09-27 – 2017-09-30 (×4): 1 via ORAL
  Filled 2017-09-27 (×4): qty 1

## 2017-09-27 MED ORDER — ZOLPIDEM TARTRATE 5 MG PO TABS
5.0000 mg | ORAL_TABLET | Freq: Every evening | ORAL | Status: DC | PRN
  Administered 2017-09-29: 5 mg via ORAL
  Filled 2017-09-27: qty 1

## 2017-09-27 MED ORDER — FOLIC ACID 1 MG PO TABS
1.0000 mg | ORAL_TABLET | Freq: Every day | ORAL | Status: DC
  Administered 2017-09-27 – 2017-09-30 (×4): 1 mg via ORAL
  Filled 2017-09-27 (×5): qty 1

## 2017-09-27 MED ORDER — SODIUM CHLORIDE 0.9 % IV SOLN
1000.0000 mL | INTRAVENOUS | Status: DC
  Administered 2017-09-27: 1000 mL via INTRAVENOUS

## 2017-09-27 MED ORDER — VANCOMYCIN HCL IN DEXTROSE 1-5 GM/200ML-% IV SOLN: 1000.0000 mg | Freq: Once | INTRAVENOUS | Status: DC

## 2017-09-27 MED ORDER — METRONIDAZOLE IN NACL 5-0.79 MG/ML-% IV SOLN
500.0000 mg | Freq: Three times a day (TID) | INTRAVENOUS | Status: DC
  Administered 2017-09-27: 500 mg via INTRAVENOUS
  Filled 2017-09-27 (×2): qty 100

## 2017-09-27 MED ORDER — VITAMIN B-1 100 MG PO TABS
100.0000 mg | ORAL_TABLET | Freq: Every day | ORAL | Status: DC
  Administered 2017-09-27 – 2017-09-30 (×4): 100 mg via ORAL
  Filled 2017-09-27 (×4): qty 1

## 2017-09-27 MED ORDER — SODIUM CHLORIDE 0.9 % IV SOLN
2.0000 g | Freq: Once | INTRAVENOUS | Status: AC
  Administered 2017-09-27: 2 g via INTRAVENOUS
  Filled 2017-09-27: qty 2

## 2017-09-27 MED ORDER — LORAZEPAM 2 MG/ML IJ SOLN
0.0000 mg | Freq: Two times a day (BID) | INTRAMUSCULAR | Status: DC
  Filled 2017-09-27: qty 1

## 2017-09-27 MED ORDER — LORAZEPAM 2 MG/ML IJ SOLN
0.0000 mg | Freq: Four times a day (QID) | INTRAMUSCULAR | Status: DC
  Administered 2017-09-27 – 2017-09-28 (×7): 2 mg via INTRAVENOUS
  Filled 2017-09-27 (×7): qty 1

## 2017-09-27 MED ORDER — SODIUM CHLORIDE 0.9 % IV SOLN
2.0000 g | Freq: Three times a day (TID) | INTRAVENOUS | Status: DC
  Administered 2017-09-27 – 2017-09-28 (×4): 2 g via INTRAVENOUS
  Filled 2017-09-27 (×4): qty 2

## 2017-09-27 NOTE — Progress Notes (Addendum)
PROGRESS NOTE        PATIENT DETAILS Name: Zachary Mata Age: 37 y.o. Sex: male Date of Birth: Oct 07, 1980 Admit Date: 09/26/2017 Admitting Physician Lorretta HarpXilin Niu, MD WUJ:[id number]:Thekkekandam, Ihor Austinhomas J, MD  Brief Narrative: Patient is a 37 y.o. male with long-standing history of alcohol and tobacco use, presenting to the hospital for evaluation of 5 day history of fever.  Found to have elevated liver enzymes, elevated lipase, leukopenia and thrombocytopenia.  Patient subsequently admitted to the hospitalist service.  See below for further details.  Subjective: Denies any abdominal pain.  Abdomen is soft-not distended.  Denies any cough,, dysuria or diarrhea.  Assessment/Plan: Sepsis: Sepsis pathophysiology has resolved, apart from elevated LFTs-no other foci is evident at this time.  Viral hepatitis, alcoholic hepatitis (although does not fit LFT elevation pattern), autoimmune hepatitis etc. all possible at this time.  No evidence of ascites on physical exam-hence doubt SBP.  Do not think patient requires vancomycin, Flagyl-have discontinued these antimicrobial agents.  Continue with cefepime pending culture data, serological work-up, CT Abd.  Since, no other foci of infection is apparent-and has  elevation of lipase (but no abd pain)-after discussion with gastroenterology over the phone-have ordered a CT scan of the abdomen.  Leukopenia/thrombocytopenia: Probably secondary to sepsis/viral infection/alcohol use- No evidence of bleeding-HIV serology pending-we will follow for now.  If significant drop in these values-we can contemplate further work-up.  Alcohol use: Last drink was on 8/22: Currently awake and alert-no tremors-continue Ativan per CIWA protocol.  Counseled-we will get social work involvement close to discharge to provide outpatient resources.  Tobacco use: On transdermal nicotine.  DVT Prophylaxis: SCD's  Code Status: Full code   Family  Communication: Spouse at bedside  Disposition Plan: Remain inpatient-will require several more days of hospitalization prior to discharge.  Antimicrobial agents: Anti-infectives (From admission, onward)   Start     Dose/Rate Route Frequency Ordered Stop   09/28/17 1200  vancomycin (VANCOCIN) IVPB 1000 mg/200 mL premix  Status:  Discontinued     1,000 mg 200 mL/hr over 60 Minutes Intravenous Every 8 hours 09/27/17 0347 09/27/17 0904   09/28/17 1000  vancomycin (VANCOCIN) IVPB 1000 mg/200 mL premix  Status:  Discontinued     1,000 mg 200 mL/hr over 60 Minutes Intravenous Every 8 hours 09/27/17 0052 09/27/17 0347   09/27/17 1000  ceFEPIme (MAXIPIME) 2 g in sodium chloride 0.9 % 100 mL IVPB     2 g 200 mL/hr over 30 Minutes Intravenous Every 8 hours 09/27/17 0052     09/27/17 0100  vancomycin (VANCOCIN) 1,250 mg in sodium chloride 0.9 % 250 mL IVPB     1,250 mg 166.7 mL/hr over 90 Minutes Intravenous  Once 09/27/17 0051 09/27/17 0538   09/27/17 0045  ceFEPIme (MAXIPIME) 2 g in sodium chloride 0.9 % 100 mL IVPB     2 g 200 mL/hr over 30 Minutes Intravenous  Once 09/27/17 0033 09/27/17 0156   09/27/17 0045  metroNIDAZOLE (FLAGYL) IVPB 500 mg  Status:  Discontinued     500 mg 100 mL/hr over 60 Minutes Intravenous Every 8 hours 09/27/17 0033 09/27/17 0905   09/27/17 0045  vancomycin (VANCOCIN) IVPB 1000 mg/200 mL premix  Status:  Discontinued     1,000 mg 200 mL/hr over 60 Minutes Intravenous  Once 09/27/17 0033 09/27/17 0051      Procedures: None  CONSULTS:  None  Time spent: 25- minutes-Greater than 50% of this time was spent in counseling, explanation of diagnosis, planning of further management, and coordination of care.  MEDICATIONS: Scheduled Meds: . folic acid  1 mg Oral Daily  . LORazepam  0-4 mg Intravenous Q6H   Followed by  . [START ON 09/29/2017] LORazepam  0-4 mg Intravenous Q12H  . multivitamin with minerals  1 tablet Oral Daily  . nicotine  21 mg Transdermal  Daily  . thiamine  100 mg Oral Daily   Or  . thiamine  100 mg Intravenous Daily   Continuous Infusions: . sodium chloride 1,000 mL (09/27/17 0923)  . ceFEPime (MAXIPIME) IV 2 g (09/27/17 0945)   PRN Meds:.hydrALAZINE, ibuprofen, LORazepam **OR** LORazepam, ondansetron (ZOFRAN) IV, senna-docusate, zolpidem   PHYSICAL EXAM: Vital signs: Vitals:   09/27/17 0115 09/27/17 0200 09/27/17 0255 09/27/17 0652  BP: (!) 143/99 (!) 157/99 (!) 148/100 (!) 140/93  Pulse: 86 93 88 88  Resp: (!) 22 (!) 21 20 20   Temp:   (!) 100.7 F (38.2 C) (!) 102.9 F (39.4 C)  TempSrc:   Oral Oral  SpO2: 99% 99% 98% 97%  Weight:      Height:       Filed Weights   09/26/17 1836  Weight: 62.6 kg   Body mass index is 22.27 kg/m.   General appearance :Awake, alert, not in any distress. Speech Clear. Not toxic Looking Eyes:Pink conjunctiva HEENT: Atraumatic and Normocephalic Neck: supple, no JVD. No cervical lymphadenopathy. No thyromegaly Resp:Good air entry bilaterally, no added sounds  CVS: S1 S2 regular, no murmurs.  GI: Bowel sounds present, Non tender and not distended with no gaurding, rigidity or rebound.No organomegaly Extremities: B/L Lower Ext shows no edema, both legs are warm to touch Neurology:  speech clear,Non focal, sensation is grossly intact. Psychiatric: Normal judgment and insight. Alert and oriented x 3. Normal mood. Musculoskeletal:No digital cyanosis Skin:No Rash, warm and dry Wounds:N/A  I have personally reviewed following labs and imaging studies  LABORATORY DATA: CBC: Recent Labs  Lab 09/26/17 1849 09/27/17 0043 09/27/17 0352  WBC 3.8*  --  2.9*  NEUTROABS 2.2  --   --   HGB 17.0  --  14.4  HCT 48.0  --  40.8  MCV 99.2  --  99.3  PLT 59* 56* 48*    Basic Metabolic Panel: Recent Labs  Lab 09/26/17 1849 09/27/17 0352  NA 131* 131*  K 4.0 3.5  CL 92* 96*  CO2 25 25  GLUCOSE 104* 93  BUN 6 9  CREATININE 0.80 0.80  CALCIUM 8.8* 7.8*     GFR: Estimated Creatinine Clearance: 113 mL/min (by C-G formula based on SCr of 0.8 mg/dL).  Liver Function Tests: Recent Labs  Lab 09/26/17 1849 09/27/17 0352  AST 663* 508*  ALT 536* 401*  ALKPHOS 269* 212*  BILITOT 1.4* 1.5*  PROT 9.0* 7.3  ALBUMIN 3.7 3.1*   Recent Labs  Lab 09/27/17 0213  LIPASE 154*   No results for input(s): AMMONIA in the last 168 hours.  Coagulation Profile: Recent Labs  Lab 09/27/17 0043 09/27/17 0213  INR 1.11 1.08    Cardiac Enzymes: No results for input(s): CKTOTAL, CKMB, CKMBINDEX, TROPONINI in the last 168 hours.  BNP (last 3 results) No results for input(s): PROBNP in the last 8760 hours.  HbA1C: No results for input(s): HGBA1C in the last 72 hours.  CBG: No results for input(s): GLUCAP in the last 168 hours.  Lipid Profile: No results for input(s): CHOL, HDL, LDLCALC, TRIG, CHOLHDL, LDLDIRECT in the last 72 hours.  Thyroid Function Tests: No results for input(s): TSH, T4TOTAL, FREET4, T3FREE, THYROIDAB in the last 72 hours.  Anemia Panel: No results for input(s): VITAMINB12, FOLATE, FERRITIN, TIBC, IRON, RETICCTPCT in the last 72 hours.  Urine analysis:    Component Value Date/Time   COLORURINE AMBER (A) 09/26/2017 1902   APPEARANCEUR CLEAR 09/26/2017 1902   LABSPEC 1.013 09/26/2017 1902   PHURINE 5.0 09/26/2017 1902   GLUCOSEU NEGATIVE 09/26/2017 1902   HGBUR MODERATE (A) 09/26/2017 1902   BILIRUBINUR NEGATIVE 09/26/2017 1902   KETONESUR 5 (A) 09/26/2017 1902   PROTEINUR 100 (A) 09/26/2017 1902   UROBILINOGEN 1.0 07/01/2014 2335   NITRITE NEGATIVE 09/26/2017 1902   LEUKOCYTESUR NEGATIVE 09/26/2017 1902    Sepsis Labs: Lactic Acid, Venous    Component Value Date/Time   LATICACIDVEN 0.8 09/27/2017 0352    MICROBIOLOGY: No results found for this or any previous visit (from the past 240 hour(s)).  RADIOLOGY STUDIES/RESULTS: Dg Chest 2 View  Result Date: 09/26/2017 CLINICAL DATA:  Fever EXAM: CHEST -  2 VIEW COMPARISON:  03/02/2012 FINDINGS: Hyperinflation. Linear scarring in the right upper lobe. No acute consolidation or effusion. Normal cardiomediastinal silhouette. No pneumothorax. IMPRESSION: No active cardiopulmonary disease.  Scarring in the right upper lobe Electronically Signed   By: Jasmine Pang M.D.   On: 09/26/2017 19:36   US Abdomen Limited Ruq  Result Date: 09/27/2017 CLINICAL DATA:  Abnormal LFT EXAM: ULTRASOUND ABDOMEN LIMITED RIGHT UPPER QUADRANT COMPARISON:  None. FINDINGS: Gallbladder: Contracted gallbladder. No shadowing stones. Normal wall thickness. Negative sonographic Murphy's Common bile duct: Diameter: 4.5 mm Liver: Increased echogenicity. No focal hepatic abnormality. Portal vein is patent on color Doppler imaging with normal direction of blood flow towards the liver. IMPRESSION: 1. Contracted gallbladder without shadowing stones or biliary dilatation. 2. Increased hepatic echogenicity consistent with steatosis Electronically Signed   By: Jasmine Pang M.D.   On: 09/27/2017 01:44     LOS: 0 days   Jeoffrey Massed, MD  [facility]  If 7PM-7AM, please contact night-coverage  Please page via [email or web address]-Password [email or web address]-click on MD name and type text message  09/27/2017, 11:25 AM

## 2017-09-27 NOTE — H&P (Signed)
History and Physical    Zachary Mata WGN:[id number] DOB: [birth year] DOA: 09/26/2017  Referring MD/NP/PA:   PCP: Monica Bectonhekkekandam, Thomas J, MD   Patient coming from:  The patient is coming from home.  At baseline, pt is independent for most of ADL.  Chief Complaint: Nausea, vomiting, fever, chills, fatigue  HPI: Zachary Mata is a 37 y.o. male with medical history significant of hypertension, tobacco abuse, alcohol abuse, anxiety, who presents with nausea, vomiting, fever, chills, fatigue.  Patient states that he has been having nausea and vomiting for more than 5 days.  He has biliary and nonbloody vomiting, 4-5 times each day.  Denies abdominal pain or diarrhea.  He also has fever of 103, and chills.  He has some mild cough with white mucus production, but no chest pain or shortness of breath.  Patient denies symptoms of UTI or unilateral weakness.  Patient states that he takes ibuprofen for fever at home.  ED Course: pt was found to have WBC 3.8, lactic acid 2.06, negative urinalysis for UTI, thrombocytopenia with platelets 59, abnormal liver function (ALP 269, AST 663, ALT 536, total bilirubin 1.4), temperature one 1.3, oxygen saturation 97% on room air, negative chest x-ray.  Patient is placed on MedSurg bed for observation.  Review of Systems:   General: has fevers, chills, no body weight gain, has poor appetite, has fatigue HEENT: no blurry vision, hearing changes or sore throat Respiratory: no dyspnea, coughing, wheezing CV: no chest pain, no palpitations GI: has nausea, vomiting, no abdominal pain, diarrhea, constipation GU: no dysuria, burning on urination, increased urinary frequency, hematuria  Ext: no leg edema Neuro: no unilateral weakness, numbness, or tingling, no vision change or hearing loss Skin: no rash, no skin tear. MSK: No muscle spasm, no deformity, no limitation of range of movement in spin Heme: No easy bruising.  Travel history: No recent long distant  travel.  Allergy:  Allergies  Allergen Reactions  . Sulfa Antibiotics Other (See Comments)    unknown    Past Medical History:  Diagnosis Date  . Chronic back pain 03/02/2012  . Essential hypertension 03/02/2012  . Hypertension     Past Surgical History:  Procedure Laterality Date  . HAND SURGERY    . WRIST SURGERY      Social History:  reports that he has been smoking cigarettes. He has a 30.00 pack-year smoking history. He has never used smokeless tobacco. He reports that he drinks about 6.0 - 7.0 standard drinks of alcohol per week. He reports that he does not use drugs.  Family History:  Family History  Problem Relation Age of Onset  . Cancer Mother        breats CA  . Diabetes Mother   . Breast cancer Unknown      Prior to Admission medications   Medication Sig Start Date End Date Taking? Authorizing Provider  cephALEXin (KEFLEX) 500 MG capsule Take 1 capsule (500 mg total) by mouth 4 (four) times daily. 08/09/17   Tilden Fossaees, Elizabeth, MD  chlordiazePOXIDE (LIBRIUM) 25 MG capsule Librium 25 mg PO BID for 5 days, followed by 15 mg PO BID x 5 days, followed by 10 mg PO BID x 5 days, followed by 5 mg PO q daily x 5 days then stop. QTY sufficient for taper 07/04/14   Jeralyn BennettZamora, Ezequiel, MD  ibuprofen (ADVIL,MOTRIN) 200 MG tablet Take 600 mg by mouth every 6 (six) hours as needed (pain).    [provider]  Naphazoline HCl (CLEAR  EYES OP) Apply 1 drop to eye daily as needed (dry eyes). For dry eyes    [provider]  SUMAtriptan (IMITREX) 50 MG tablet Take 50 mg by mouth every 2 (two) hours as needed for migraine.  09/09/13   [provider]  traMADol (ULTRAM) 50 MG tablet Take 1 tablet (50 mg total) by mouth every 6 (six) hours as needed for severe pain. 07/04/14   Jeralyn Bennett, MD    Physical Exam: Vitals:   09/26/17 1836 09/26/17 2049 09/26/17 2242 09/27/17 0115  BP:  117/81 123/88 (!) 143/99  Pulse:  88 88 86  Resp:  18 18 (!) 22  Temp:  99.8 F  (37.7 C)    TempSrc:  Oral    SpO2:  97% 97% 99%  Weight: 62.6 kg     Height: 5\' 6"  (1.676 m)      General: Not in acute distress HEENT:       Eyes: PERRL, EOMI, no scleral icterus.       ENT: No discharge from the ears and nose, no pharynx injection, no tonsillar enlargement.        Neck: No JVD, no bruit, no mass felt. Heme: No neck lymph node enlargement. Cardiac: S1/S2, RRR, No murmurs, No gallops or rubs. Respiratory:  No rales, wheezing, rhonchi or rubs. GI: Soft, nondistended, nontender, no rebound pain, no organomegaly, BS present. GU: No hematuria Ext: No pitting leg edema bilaterally. 2+DP/PT pulse bilaterally. Musculoskeletal: No joint deformities, No joint redness or warmth, no limitation of ROM in spin. Skin: No rashes.  Neuro: Alert, oriented X3, cranial nerves II-XII grossly intact, moves all extremities normally.  Psych: Patient is not psychotic, no suicidal or hemocidal ideation.  Labs on Admission: I have personally reviewed following labs and imaging studies  CBC: Recent Labs  Lab 09/26/17 1849 09/27/17 0043  WBC 3.8*  --   NEUTROABS 2.2  --   HGB 17.0  --   HCT 48.0  --   MCV 99.2  --   PLT 59* 56*   Basic Metabolic Panel: Recent Labs  Lab 09/26/17 1849  NA 131*  K 4.0  CL 92*  CO2 25  GLUCOSE 104*  BUN 6  CREATININE 0.80  CALCIUM 8.8*   GFR: Estimated Creatinine Clearance: 113 mL/min (by C-G formula based on SCr of 0.8 mg/dL). Liver Function Tests: Recent Labs  Lab 09/26/17 1849  AST 663*  ALT 536*  ALKPHOS 269*  BILITOT 1.4*  PROT 9.0*  ALBUMIN 3.7   No results for input(s): LIPASE, AMYLASE in the last 168 hours. No results for input(s): AMMONIA in the last 168 hours. Coagulation Profile: Recent Labs  Lab 09/27/17 0043  INR PENDING   Cardiac Enzymes: No results for input(s): CKTOTAL, CKMB, CKMBINDEX, TROPONINI in the last 168 hours. BNP (last 3 results) No results for input(s): PROBNP in the last 8760 hours. HbA1C: No  results for input(s): HGBA1C in the last 72 hours. CBG: No results for input(s): GLUCAP in the last 168 hours. Lipid Profile: No results for input(s): CHOL, HDL, LDLCALC, TRIG, CHOLHDL, LDLDIRECT in the last 72 hours. Thyroid Function Tests: No results for input(s): TSH, T4TOTAL, FREET4, T3FREE, THYROIDAB in the last 72 hours. Anemia Panel: No results for input(s): VITAMINB12, FOLATE, FERRITIN, TIBC, IRON, RETICCTPCT in the last 72 hours. Urine analysis:    Component Value Date/Time   COLORURINE AMBER (A) 09/26/2017 1902   APPEARANCEUR CLEAR 09/26/2017 1902   LABSPEC 1.013 09/26/2017 1902   PHURINE 5.0  09/26/2017 1902   GLUCOSEU NEGATIVE 09/26/2017 1902   HGBUR MODERATE (A) 09/26/2017 1902   BILIRUBINUR NEGATIVE 09/26/2017 1902   KETONESUR 5 (A) 09/26/2017 1902   PROTEINUR 100 (A) 09/26/2017 1902   UROBILINOGEN 1.0 07/01/2014 2335   NITRITE NEGATIVE 09/26/2017 1902   LEUKOCYTESUR NEGATIVE 09/26/2017 1902   Sepsis Labs: @LABRCNTIP (procalcitonin:4,lacticidven:4) )No results found for this or any previous visit (from the past 240 hour(s)).   Radiological Exams on Admission: Dg Chest 2 View  Result Date: 09/26/2017 CLINICAL DATA:  Fever EXAM: CHEST - 2 VIEW COMPARISON:  03/02/2012 FINDINGS: Hyperinflation. Linear scarring in the right upper lobe. No acute consolidation or effusion. Normal cardiomediastinal silhouette. No pneumothorax. IMPRESSION: No active cardiopulmonary disease.  Scarring in the right upper lobe Electronically Signed   By: Jasmine Pang M.D.   On: 09/26/2017 19:36     EKG:  Not done in ED, will get one.   Assessment/Plan Principal Problem:   Sepsis (HCC) Active Problems:   Essential hypertension   Alcohol abuse   Abnormal LFTs   Nausea & vomiting   Thrombocytopenia (HCC)   Sepsis [facility]): Patient meets criteria for sepsis with WBC 3.8 (less than 4), fever, tachycardia, elevated lactic acid.  Currently hemodynamically stable.  Source of infection is not  clear.  Differential diagnosis to include viral hepatitis given abnormal liver function and biliary system infection.  -Placed on MedSurg bed for observation -Patient was started with vancomycin, cefepime and Flagyl in ED, will continue -Follow-up blood culture, urine culture --will get Procalcitonin and trend lactic acid levels per sepsis protocol. -IVF: 2L of NS bolus in ED, followed by 125 cc/h   Abnormal LFTs: Likely due to alcohol abuse, but still need to rule out other possibilities. -will hepatitis panel, HIV antibody -RUQ-US -Check lipase  HTN: Blood pressure 123/88.  Patient is not taking medications at home. -IV hydralazine as needed  Nausea & vomiting: Etiology is not clear.  No abdominal pain or diarrhea.  Differential diagnosis to include viral gastritis and hepatitis -PRN Zofran for nausea -IV fluid as above  Tobacco abuse and Alcohol abuse: -Did counseling about importance of quitting smoking and drinking alcohol -Nicotine patch -CIWA protocol  Thrombocytopenia (HCC): Platelet of 59.  Mental status normal, less likely to have TTP.  Etiology is not clear.  May be related to alcohol abuse or possible alcoholic liver cirrhosis -DIC panel was ordered by EDP - LDH, peripheral smear -Follow-up by CBC    DVT ppx: SCD Code Status: Full code Family Communication:    Yes, patient's wife at bed side Disposition Plan:  Anticipate discharge back to previous home environment Consults called:  none Admission status:  medical floor/obs    Date of Service 09/27/2017    Lorretta Harp [facility] Pager [phone]  If 7PM-7AM, please contact night-coverage [email or web address] Password [email or web address] 09/27/2017, 1:41 AM

## 2017-09-27 NOTE — Progress Notes (Signed)
Patient admited from ED at this time . Patient is alert and oriented. Denies any pain. Patient oriented to unit and equipment. Call bell is witihin reach. Will continue to monitor.

## 2017-09-27 NOTE — Progress Notes (Signed)
Pharmacy Antibiotic Note  Zachary Mata is a 37 y.o. male admitted on 09/26/2017 with infection of unkown source.  Pharmacy has been consulted for Cefepime and vancomycin dosing. Pt presents with a 3-day h/o N/V, fever, sweats and chills. Tm 101.2F in ED. WBC 3.8. LA 2.06. SCr wnl. CrCl  ~ 120-130 mL/min.   Plan: -Cefepime 2 gm IV Q 8 hours  -Vancomycin 1250 mg IV once, then vancomycin 1 gm IV Q 8 hours -Monitor CBC, renal fx, cultures and clinical progress  -VT at SS    Height: 5\' 6"  (167.6 cm) Weight: 138 lb (62.6 kg) IBW/kg (Calculated) : 63.8  Temp (24hrs), Avg:100.6 F (38.1 C), Min:99.8 F (37.7 C), Max:101.3 F (38.5 C)  Recent Labs  Lab 09/26/17 1849 09/26/17 1903  WBC 3.8*  --   CREATININE 0.80  --   LATICACIDVEN  --  2.06*    Estimated Creatinine Clearance: 113 mL/min (by C-G formula based on SCr of 0.8 mg/dL).    Allergies  Allergen Reactions  . Sulfa Antibiotics Other (See Comments)    unknown    Antimicrobials this admission: Vanc 8/24 >>  Cefepime 8/24 >>   Dose adjustments this admission:  Microbiology results: 8/24 BCx:    Thank you for allowing pharmacy to be a part of this patient's care.  Vinnie LevelBenjamin Jarmal Lewelling, PharmD., BCPS Clinical Pharmacist

## 2017-09-27 NOTE — Plan of Care (Signed)
  Problem: Education: Goal: Knowledge of General Education information will improve Description: Including pain rating scale, medication(s)/side effects and non-pharmacologic comfort measures Outcome: Progressing   Problem: Activity: Goal: Risk for activity intolerance will decrease Outcome: Progressing   Problem: Nutrition: Goal: Adequate nutrition will be maintained Outcome: Progressing   Problem: Coping: Goal: Level of anxiety will decrease Outcome: Progressing   Problem: Elimination: Goal: Will not experience complications related to bowel motility Outcome: Progressing Goal: Will not experience complications related to urinary retention Outcome: Progressing   Problem: Safety: Goal: Ability to remain free from injury will improve Outcome: Progressing   Problem: Skin Integrity: Goal: Risk for impaired skin integrity will decrease Outcome: Progressing   

## 2017-09-27 NOTE — ED Provider Notes (Signed)
[facility] EMERGENCY DEPARTMENT Provider Note   CSN: [id number] Arrival date & time: 09/26/17  1804     History   Chief Complaint Chief Complaint  Patient presents with  . Fever  . Emesis    HPI Zachary Mata is a 37 y.o. male.  The history is provided by the patient.  He has history of hypertension and alcohol withdrawal and comes in with a 3-day history of nausea, vomiting, fever, chills, sweats.  Temperature at home has been as high as 103.0.  He denies diarrhea.  He denies arthralgias or myalgias.  He has had anorexia and states that his cigarettes it are not tasting right.  He denies any abdominal pain or chest pain.  He denies any sick contacts.  He has not done anything to treat it at home.  Past Medical History:  Diagnosis Date  . Chronic back pain 03/02/2012  . Essential hypertension 03/02/2012  . Hypertension     Patient Active Problem List   Diagnosis Date Noted  . Alcohol abuse   . Alcohol withdrawal delirium (HCC)   . Mental health problem   . Withdrawal syndrome (HCC) 07/01/2014  . Preventive measure 06/19/2012  . Essential hypertension 03/02/2012  . Chronic back pain 03/02/2012    Past Surgical History:  Procedure Laterality Date  . HAND SURGERY    . WRIST SURGERY          Home Medications    Prior to Admission medications   Medication Sig Start Date End Date Taking? Authorizing Provider  cephALEXin (KEFLEX) 500 MG capsule Take 1 capsule (500 mg total) by mouth 4 (four) times daily. 08/09/17   Tilden Fossa, MD  chlordiazePOXIDE (LIBRIUM) 25 MG capsule Librium 25 mg PO BID for 5 days, followed by 15 mg PO BID x 5 days, followed by 10 mg PO BID x 5 days, followed by 5 mg PO q daily x 5 days then stop. QTY sufficient for taper 07/04/14   Jeralyn Bennett, MD  ibuprofen (ADVIL,MOTRIN) 200 MG tablet Take 600 mg by mouth every 6 (six) hours as needed (pain).    [provider]  Naphazoline HCl (CLEAR EYES OP) Apply 1 drop to  eye daily as needed (dry eyes). For dry eyes    [provider]  SUMAtriptan (IMITREX) 50 MG tablet Take 50 mg by mouth every 2 (two) hours as needed for migraine.  09/09/13   [provider]  traMADol (ULTRAM) 50 MG tablet Take 1 tablet (50 mg total) by mouth every 6 (six) hours as needed for severe pain. 07/04/14   Jeralyn Bennett, MD    Family History Family History  Problem Relation Age of Onset  . Cancer Mother        breats CA  . Diabetes Mother   . Breast cancer Unknown     Social History Social History   Tobacco Use  . Smoking status: Current Every Day Smoker    Packs/day: 2.00    Years: 15.00    Pack years: 30.00    Types: Cigarettes  . Smokeless tobacco: Never Used  Substance Use Topics  . Alcohol use: Yes    Alcohol/week: 6.0 - 7.0 standard drinks    Types: 6 - 7 Cans of beer per week  . Drug use: No     Allergies   Sulfa antibiotics   Review of Systems Review of Systems  All other systems reviewed and are negative.    Physical Exam Updated Vital Signs  BP 123/88 (BP Location: Right Arm)   Pulse 88   Temp 99.8 F (37.7 C) (Oral)   Resp 18   Ht 5\' 6"  (1.676 m)   Wt 62.6 kg   SpO2 97%   BMI 22.27 kg/m   Physical Exam  Nursing note and vitals reviewed.  37 year old male, resting comfortably and in no acute distress. Vital signs are normal. Oxygen saturation is 97%, which is normal. Head is normocephalic and atraumatic. PERRLA, EOMI. Oropharynx is clear. Neck is nontender and supple without adenopathy or JVD. Back is nontender and there is no CVA tenderness. Lungs are clear without rales, wheezes, or rhonchi. Chest is nontender. Heart has regular rate and rhythm without murmur. Abdomen is soft, flat, nontender without masses or hepatosplenomegaly and peristalsis is hypoactive. Extremities have no cyanosis or edema, full range of motion is present. Skin is warm and dry without rash. Neurologic: Mental status is normal, cranial  nerves are intact, there are no motor or sensory deficits.  ED Treatments / Results  Labs (all labs ordered are listed, but only abnormal results are displayed) Labs Reviewed  COMPREHENSIVE METABOLIC PANEL - Abnormal; Notable for the following components:      Result Value   Sodium 131 (*)    Chloride 92 (*)    Glucose, Bld 104 (*)    Calcium 8.8 (*)    Total Protein 9.0 (*)    AST 663 (*)    ALT 536 (*)    Alkaline Phosphatase 269 (*)    Total Bilirubin 1.4 (*)    All other components within normal limits  CBC WITH DIFFERENTIAL/PLATELET - Abnormal; Notable for the following components:   WBC 3.8 (*)    MCH 35.1 (*)    RDW 11.1 (*)    Platelets 59 (*)    All other components within normal limits  URINALYSIS, ROUTINE W REFLEX MICROSCOPIC - Abnormal; Notable for the following components:   Color, Urine AMBER (*)    Hgb urine dipstick MODERATE (*)    Ketones, ur 5 (*)    Protein, ur 100 (*)    Bacteria, UA RARE (*)    All other components within normal limits  I-STAT CG4 LACTIC ACID, ED - Abnormal; Notable for the following components:   Lactic Acid, Venous 2.06 (*)    All other components within normal limits  I-STAT CG4 LACTIC ACID, ED    Radiology Dg Chest 2 View  Result Date: 09/26/2017 CLINICAL DATA:  Fever EXAM: CHEST - 2 VIEW COMPARISON:  03/02/2012 FINDINGS: Hyperinflation. Linear scarring in the right upper lobe. No acute consolidation or effusion. Normal cardiomediastinal silhouette. No pneumothorax. IMPRESSION: No active cardiopulmonary disease.  Scarring in the right upper lobe Electronically Signed   By: Jasmine PangKim  Fujinaga M.D.   On: 09/26/2017 19:36    Procedures Procedures   Medications Ordered in ED Medications  ondansetron (ZOFRAN) injection 4 mg (has no administration in time range)  ceFEPIme (MAXIPIME) 2 g in sodium chloride 0.9 % 100 mL IVPB (has no administration in time range)  metroNIDAZOLE (FLAGYL) IVPB 500 mg (has no administration in time range)    0.9 %  sodium chloride infusion (has no administration in time range)  sodium chloride 0.9 % bolus 1,000 mL (has no administration in time range)    And  sodium chloride 0.9 % bolus 1,000 mL (has no administration in time range)  vancomycin (VANCOCIN) 1,250 mg in sodium chloride 0.9 % 250 mL IVPB (has no administration in time  range)  acetaminophen (TYLENOL) tablet 650 mg (650 mg Oral Given 09/26/17 1841)     Initial Impression / Assessment and Plan / ED Course  I have reviewed the triage vital signs and the nursing notes.  Pertinent labs & imaging results that were available during my care of the patient were reviewed by me and considered in my medical decision making (see chart for details).  Fever with vomiting which seems likely to be viral illness.  Screening labs have been obtained prior to my evaluating him and lactic acid is come back mildly elevated.  Also, he is noted to have leukopenia and thrombocytopenia.  Transaminases are significantly elevated-ALT 536, AST 663.  Alkaline phosphatase also elevated to 269.  Overall picture most consistent with hepatitis, presumably viral.  He is given IV fluids.  Will need to repeat lactic acid after fluids.  Given fever, elevated lactic acid, thrombocytopenia, elevated LFTs, urinalysis does have moderate hemoglobin with no RBCs seen concerning for possible hemolysis.  Chest x-ray shows no acute process.  Consider possibility of early TTP.  I feel he should be admitted for observation and hydration.  He is started on empiric antibiotics for sepsis of unknown source.  Old records are reviewed, and he has no relevant past visits.  Prior transaminases have been elevated to approximately 60.  Case is discussed with Dr. Clyde Lundborg of [facility], who agrees to admit the patient.  Final Clinical Impressions(s) / ED Diagnoses   Final diagnoses:  Sepsis due to undetermined organism (HCC)  Elevated liver function tests  Thrombocytopenia (HCC)  Hyponatremia   Leukopenia, unspecified type    ED Discharge Orders    None       Dione Booze, MD 09/27/17 [phone]

## 2017-09-27 NOTE — ED Notes (Signed)
Patient transported to Ultrasound 

## 2017-09-27 NOTE — ED Notes (Signed)
Pt has made the password, Zachary Mata so that his significant other may obtain PHI over the phone.

## 2017-09-27 NOTE — Progress Notes (Addendum)
Nutrition Brief Note  Patient identified on the Malnutrition Screening Tool (MST) Report.  Patient admitted with Hyponatremia [E87.1] Thrombocytopenia (HCC) [D69.6] Elevated liver function tests [R94.5] Abnormal LFTs [R94.5] Sepsis due to undetermined organism (HCC) [A41.9] Leukopenia, unspecified type [D72.819].  Wt Readings from Last 15 Encounters:  09/26/17 62.6 kg  08/09/17 62.6 kg  07/04/14 58 kg  01/13/13 58.1 kg  07/30/12 57.2 kg  07/15/12 61.2 kg  06/19/12 60.8 kg  05/04/12 61.2 kg  04/08/12 61.1 kg  03/02/12 59 kg    Body mass index is 22.27 kg/m. Patient meets criteria for Normal based on current BMI.   RD spoke with pt's fiance who reports she feels pt would be able to tolerate solids. Last time pt vomited was yesterday around 6:00 AM. He is having no diarrhea. Fiance also reveals pt has had no weight loss recently.  Verbal with Read Back order received per Dr. Jerral RalphGhimire for Regular diet advancement.   Labs and medications reviewed. No further nutrition interventions warranted at this time.   If nutrition issues arise, please consult RD.   Maureen ChattersKatie Yumalay Circle, RD, LDN Pager #: [phone] After-Hours Pager #: [phone]

## 2017-09-27 NOTE — Plan of Care (Signed)

## 2017-09-28 DIAGNOSIS — M791 Myalgia, unspecified site: Secondary | ICD-10-CM

## 2017-09-28 DIAGNOSIS — D696 Thrombocytopenia, unspecified: Secondary | ICD-10-CM

## 2017-09-28 DIAGNOSIS — F1010 Alcohol abuse, uncomplicated: Secondary | ICD-10-CM

## 2017-09-28 DIAGNOSIS — Z882 Allergy status to sulfonamides status: Secondary | ICD-10-CM

## 2017-09-28 DIAGNOSIS — F17210 Nicotine dependence, cigarettes, uncomplicated: Secondary | ICD-10-CM

## 2017-09-28 DIAGNOSIS — R509 Fever, unspecified: Secondary | ICD-10-CM

## 2017-09-28 DIAGNOSIS — R112 Nausea with vomiting, unspecified: Secondary | ICD-10-CM

## 2017-09-28 DIAGNOSIS — R740 Nonspecific elevation of levels of transaminase and lactic acid dehydrogenase [LDH]: Secondary | ICD-10-CM

## 2017-09-28 LAB — CBC WITH DIFFERENTIAL/PLATELET
Band Neutrophils: 2 %
Basophils Absolute: 0 10*3/uL (ref 0.0–0.1)
Basophils Relative: 0 %
Blasts: 0 %
Eosinophils Absolute: 0 10*3/uL (ref 0.0–0.7)
Eosinophils Relative: 0 %
HCT: 43.8 % (ref 39.0–52.0)
Hemoglobin: 15.5 g/dL (ref 13.0–17.0)
Lymphocytes Relative: 30 %
Lymphs Abs: 1 10*3/uL (ref 0.7–4.0)
MCH: 35.4 pg — ABNORMAL HIGH (ref 26.0–34.0)
MCHC: 35.4 g/dL (ref 30.0–36.0)
MCV: 100 fL (ref 78.0–100.0)
Metamyelocytes Relative: 0 %
Monocytes Absolute: 0.7 10*3/uL (ref 0.1–1.0)
Monocytes Relative: 20 %
Myelocytes: 0 %
Neutro Abs: 1.6 10*3/uL — ABNORMAL LOW (ref 1.7–7.7)
Neutrophils Relative %: 48 %
Other: 0 %
Platelets: 54 10*3/uL — ABNORMAL LOW (ref 150–400)
Promyelocytes Relative: 0 %
RBC: 4.38 MIL/uL (ref 4.22–5.81)
RDW: 11.1 % — ABNORMAL LOW (ref 11.5–15.5)
WBC: 3.3 10*3/uL — ABNORMAL LOW (ref 4.0–10.5)
nRBC: 0 /100 WBC

## 2017-09-28 LAB — HEPATIC FUNCTION PANEL
ALT: 306 U/L — ABNORMAL HIGH (ref 0–44)
AST: 303 U/L — ABNORMAL HIGH (ref 15–41)
Albumin: 2.9 g/dL — ABNORMAL LOW (ref 3.5–5.0)
Alkaline Phosphatase: 192 U/L — ABNORMAL HIGH (ref 38–126)
Bilirubin, Direct: 0.4 mg/dL — ABNORMAL HIGH (ref 0.0–0.2)
Indirect Bilirubin: 0.7 mg/dL (ref 0.3–0.9)
Total Bilirubin: 1.1 mg/dL (ref 0.3–1.2)
Total Protein: 7 g/dL (ref 6.5–8.1)

## 2017-09-28 LAB — BASIC METABOLIC PANEL
Anion gap: 12 (ref 5–15)
BUN: 6 mg/dL (ref 6–20)
CO2: 23 mmol/L (ref 22–32)
Calcium: 8.1 mg/dL — ABNORMAL LOW (ref 8.9–10.3)
Chloride: 99 mmol/L (ref 98–111)
Creatinine, Ser: 0.75 mg/dL (ref 0.61–1.24)
GFR calc Af Amer: >60 mL/min (ref >60)
GFR calc non Af Amer: >60 mL/min (ref >60)
Glucose, Bld: 83 mg/dL (ref 70–99)
Potassium: 3.4 mmol/L — ABNORMAL LOW (ref 3.5–5.1)
Sodium: 134 mmol/L — ABNORMAL LOW (ref 135–145)

## 2017-09-28 LAB — MAGNESIUM: Magnesium: 1.8 mg/dL (ref 1.7–2.4)

## 2017-09-28 LAB — URINE CULTURE: Culture: NO GROWTH

## 2017-09-28 LAB — ANTI-SMOOTH MUSCLE ANTIBODY, IGG: F-Actin IgG: 22 Units — ABNORMAL HIGH (ref 0–19)

## 2017-09-28 NOTE — Progress Notes (Signed)
PROGRESS NOTE        PATIENT DETAILS Name: Zachary Mata Age: 37 y.o. Sex: male Date of Birth: May 14, 1980 Admit Date: 09/26/2017 Admitting Physician Lorretta Harp, MD ZOX:[id number], Ihor Austin, MD  Brief Narrative: Patient is a 37 y.o. male with long-standing history of alcohol and tobacco use, presenting to the hospital for evaluation of 5 day history of fever.  Found to have elevated liver enzymes, elevated lipase, leukopenia and thrombocytopenia.  Patient subsequently admitted to the hospitalist service.  See below for further details.  Subjective: Fever continues-denies any myalgias or headaches.  Denies any abdominal pain.  Assessment/Plan: Sepsis: Sepsis pathophysiology has resolved-he is hemodynamically stable.  Given elevated transaminases, leukopenia/thrombocytopenia-suspicion for a probable viral hepatitis.  Given history of alcohol use-alcoholic hepatitis in the differential but LFTs pattern is not classic for alcoholic hepatitis.  Given lack of headache/myalgias-doubt tickborne illness at this time.  No foci of infection apparent on CT of the abdomen and chest x-ray.  HIV serology negative.  Blood cultures and acute hepatitis panel pending.  Currently off all antimicrobial therapy-we will discuss case with ID.  Leukopenia/thrombocytopenia: Probably secondary to sepsis/viral infection/possible alcohol hepatitis.  Slowly improving-continue to follow CBC.  Since improving-we can defer further work-up at this time.   Alcohol use: Last drink on 8/22-no tremors-awake/alert.  Continue Ativan per CIWA protocol.  Continue counseling-we will get social work involved prior to discharge to provide outpatient resources.  Tobacco use: On transdermal nicotine.  DVT Prophylaxis: SCD's  Code Status: Full code   Family Communication: Spouse at bedside  Disposition Plan: Remain inpatient-will require several more days of hospitalization prior to  discharge.  Antimicrobial agents: Anti-infectives (From admission, onward)   Start     Dose/Rate Route Frequency Ordered Stop   09/28/17 1200  vancomycin (VANCOCIN) IVPB 1000 mg/200 mL premix  Status:  Discontinued     1,000 mg 200 mL/hr over 60 Minutes Intravenous Every 8 hours 09/27/17 0347 09/27/17 0904   09/28/17 1000  vancomycin (VANCOCIN) IVPB 1000 mg/200 mL premix  Status:  Discontinued     1,000 mg 200 mL/hr over 60 Minutes Intravenous Every 8 hours 09/27/17 0052 09/27/17 0347   09/27/17 1000  ceFEPIme (MAXIPIME) 2 g in sodium chloride 0.9 % 100 mL IVPB  Status:  Discontinued     2 g 200 mL/hr over 30 Minutes Intravenous Every 8 hours 09/27/17 0052 09/28/17 0957   09/27/17 0100  vancomycin (VANCOCIN) 1,250 mg in sodium chloride 0.9 % 250 mL IVPB     1,250 mg 166.7 mL/hr over 90 Minutes Intravenous  Once 09/27/17 0051 09/27/17 0600   09/27/17 0045  ceFEPIme (MAXIPIME) 2 g in sodium chloride 0.9 % 100 mL IVPB     2 g 200 mL/hr over 30 Minutes Intravenous  Once 09/27/17 0033 09/27/17 0156   09/27/17 0045  metroNIDAZOLE (FLAGYL) IVPB 500 mg  Status:  Discontinued     500 mg 100 mL/hr over 60 Minutes Intravenous Every 8 hours 09/27/17 0033 09/27/17 0905   09/27/17 0045  vancomycin (VANCOCIN) IVPB 1000 mg/200 mL premix  Status:  Discontinued     1,000 mg 200 mL/hr over 60 Minutes Intravenous  Once 09/27/17 0033 09/27/17 0051      Procedures: None  CONSULTS:  None  Time spent: 25- minutes-Greater than 50% of this time was spent in counseling, explanation of diagnosis, planning  of further management, and coordination of care.  MEDICATIONS: Scheduled Meds: . folic acid  1 mg Oral Daily  . LORazepam  0-4 mg Intravenous Q6H   Followed by  . [START ON 09/29/2017] LORazepam  0-4 mg Intravenous Q12H  . multivitamin with minerals  1 tablet Oral Daily  . nicotine  21 mg Transdermal Daily  . thiamine  100 mg Oral Daily   Or  . thiamine  100 mg Intravenous Daily   Continuous  Infusions: . sodium chloride 75 mL/hr at 09/28/17 0712   PRN Meds:.hydrALAZINE, ibuprofen, LORazepam **OR** LORazepam, ondansetron (ZOFRAN) IV, senna-docusate, zolpidem   PHYSICAL EXAM: Vital signs: Vitals:   09/27/17 1946 09/27/17 2146 09/28/17 0130 09/28/17 0450  BP: (!) 167/104   (!) 150/109  Pulse: 84  76 83  Resp: 18  16 18   Temp: (!) 103 F (39.4 C) (!) 100.6 F (38.1 C) 98.5 F (36.9 C) 99.5 F (37.5 C)  TempSrc: Oral Oral Oral Oral  SpO2: 98%  100% 99%  Weight:      Height:       Filed Weights   09/26/17 1836  Weight: 62.6 kg   Body mass index is 22.27 kg/m.   General appearance:Awake, alert, not in any distress.  Eyes:no scleral icterus. HEENT: Atraumatic and Normocephalic Neck: supple, no JVD. Resp:Good air entry bilaterally,no rales or rhonchi CVS: S1 S2 regular, no murmurs.  GI: Bowel sounds present, Non tender and not distended with no gaurding, rigidity or rebound. Extremities: B/L Lower Ext shows no edema, both legs are warm to touch Neurology:  Non focal Psychiatric: Normal judgment and insight. Normal mood. Musculoskeletal:No digital cyanosis Skin:No Rash, warm and dry Wounds:N/A  I have personally reviewed following labs and imaging studies  LABORATORY DATA: CBC: Recent Labs  Lab 09/26/17 1849 09/27/17 0043 09/27/17 0352 09/28/17 0504  WBC 3.8*  --  2.9* 3.3*  NEUTROABS 2.2  --   --  1.6*  HGB 17.0  --  14.4 15.5  HCT 48.0  --  40.8 43.8  MCV 99.2  --  99.3 100.0  PLT 59* 56* 48* 54*    Basic Metabolic Panel: Recent Labs  Lab 09/26/17 1849 09/27/17 0352  NA 131* 131*  K 4.0 3.5  CL 92* 96*  CO2 25 25  GLUCOSE 104* 93  BUN 6 9  CREATININE 0.80 0.80  CALCIUM 8.8* 7.8*    GFR: Estimated Creatinine Clearance: 113 mL/min (by C-G formula based on SCr of 0.8 mg/dL).  Liver Function Tests: Recent Labs  Lab 09/26/17 1849 09/27/17 0352 09/28/17 0504  AST 663* 508* 303*  ALT 536* 401* 306*  ALKPHOS 269* 212* 192*  BILITOT  1.4* 1.5* 1.1  PROT 9.0* 7.3 7.0  ALBUMIN 3.7 3.1* 2.9*   Recent Labs  Lab 09/27/17 0213  LIPASE 154*   No results for input(s): AMMONIA in the last 168 hours.  Coagulation Profile: Recent Labs  Lab 09/27/17 0043 09/27/17 0213  INR 1.11 1.08    Cardiac Enzymes: No results for input(s): CKTOTAL, CKMB, CKMBINDEX, TROPONINI in the last 168 hours.  BNP (last 3 results) No results for input(s): PROBNP in the last 8760 hours.  HbA1C: No results for input(s): HGBA1C in the last 72 hours.  CBG: No results for input(s): GLUCAP in the last 168 hours.  Lipid Profile: No results for input(s): CHOL, HDL, LDLCALC, TRIG, CHOLHDL, LDLDIRECT in the last 72 hours.  Thyroid Function Tests: No results for input(s): TSH, T4TOTAL, FREET4, T3FREE, THYROIDAB in the  last 72 hours.  Anemia Panel: No results for input(s): VITAMINB12, FOLATE, FERRITIN, TIBC, IRON, RETICCTPCT in the last 72 hours.  Urine analysis:    Component Value Date/Time   COLORURINE AMBER (A) 09/26/2017 1902   APPEARANCEUR CLEAR 09/26/2017 1902   LABSPEC 1.013 09/26/2017 1902   PHURINE 5.0 09/26/2017 1902   GLUCOSEU NEGATIVE 09/26/2017 1902   HGBUR MODERATE (A) 09/26/2017 1902   BILIRUBINUR NEGATIVE 09/26/2017 1902   KETONESUR 5 (A) 09/26/2017 1902   PROTEINUR 100 (A) 09/26/2017 1902   UROBILINOGEN 1.0 07/01/2014 2335   NITRITE NEGATIVE 09/26/2017 1902   LEUKOCYTESUR NEGATIVE 09/26/2017 1902    Sepsis Labs: Lactic Acid, Venous    Component Value Date/Time   LATICACIDVEN 0.8 09/27/2017 0352    MICROBIOLOGY: No results found for this or any previous visit (from the past 240 hour(s)).  RADIOLOGY STUDIES/RESULTS: Dg Chest 2 View  Result Date: 09/26/2017 CLINICAL DATA:  Fever EXAM: CHEST - 2 VIEW COMPARISON:  03/02/2012 FINDINGS: Hyperinflation. Linear scarring in the right upper lobe. No acute consolidation or effusion. Normal cardiomediastinal silhouette. No pneumothorax. IMPRESSION: No active  cardiopulmonary disease.  Scarring in the right upper lobe Electronically Signed   By: Jasmine PangKim  Fujinaga M.D.   On: 09/26/2017 19:36   Ct Abdomen Pelvis W Contrast  Result Date: 09/27/2017 CLINICAL DATA:  Fever. Elevated liver function tests. Lipase elevation. EXAM: CT ABDOMEN AND PELVIS WITH CONTRAST TECHNIQUE: Multidetector CT imaging of the abdomen and pelvis was performed using the standard protocol following bolus administration of intravenous contrast. CONTRAST:  100mL ISOVUE-300 IOPAMIDOL (ISOVUE-300) INJECTION 61% COMPARISON:  Ultrasound same day. FINDINGS: Lower chest: Normal Hepatobiliary: Profound diffuse fatty change of the liver. No evidence of focal liver lesion. No calcified gallstones are seen. No sign of ductal dilatation. Pancreas: Normal Spleen: Normal Adrenals/Urinary Tract: Adrenal glands are normal. Kidneys are normal. Bladder is normal. Stomach/Bowel: No bowel pathology is seen. Vascular/Lymphatic: Normal Reproductive: Normal Other: No free fluid or air. Musculoskeletal: Normal IMPRESSION: Profound diffuse fatty change of the liver. No other abnormal finding. Electronically Signed   By: Paulina FusiMark  Shogry M.D.   On: 09/27/2017 16:04   [place] Abdomen Limited Ruq  Result Date: 09/27/2017 CLINICAL DATA:  Abnormal LFT EXAM: ULTRASOUND ABDOMEN LIMITED RIGHT UPPER QUADRANT COMPARISON:  None. FINDINGS: Gallbladder: Contracted gallbladder. No shadowing stones. Normal wall thickness. Negative sonographic Murphy's Common bile duct: Diameter: 4.5 mm Liver: Increased echogenicity. No focal hepatic abnormality. Portal vein is patent on color Doppler imaging with normal direction of blood flow towards the liver. IMPRESSION: 1. Contracted gallbladder without shadowing stones or biliary dilatation. 2. Increased hepatic echogenicity consistent with steatosis Electronically Signed   By: Jasmine PangKim  Fujinaga M.D.   On: 09/27/2017 01:44     LOS: 0 days   Jeoffrey MassedShanker Mizani Dilday, MD  [facility]  If 7PM-7AM, please  contact night-coverage  Please page via [email or web address]-Password [email or web address]-click on MD name and type text message  09/28/2017, 10:25 AM

## 2017-09-28 NOTE — Consult Note (Signed)
[facility]       Reason for Consult: fever    Referring Physician: Dr. Jerral RalphGhimire  Principal Problem:   Sepsis [facility](HCC) Active Problems:   Essential hypertension   Alcohol abuse   Abnormal LFTs   Nausea & vomiting   Thrombocytopenia (HCC)   . folic acid  1 mg Oral Daily  . LORazepam  0-4 mg Intravenous Q6H   Followed by  . [START ON 09/29/2017] LORazepam  0-4 mg Intravenous Q12H  . multivitamin with minerals  1 tablet Oral Daily  . nicotine  21 mg Transdermal Daily  . thiamine  100 mg Oral Daily   Or  . thiamine  100 mg Intravenous Daily    Recommendations: Supportive care Establish a pcp/cone assistance Evaluation for chronic thrombocytopenia and transaminitis as an outpatient if hep B or C are not positive If hep B or C positive, have him follow up with [facility]  Assessment: He has fever, myalgias associated with n/v c/w viral syndrome.  Some increased elevation of transaminases over chronic elevation but improving.     Antibiotics: none  HPI: Zachary Mata is a 37 y.o. male with tobacco and alcohol abuse, chronic transaminitis, thrombocytopenia who presented with above.  Work up to date unrevealing.  He is feeling improved. Leukopenia with previous normal baseline in May 2019.  Platelets 89 in 06/2017 and AST/ALT 145/149.  Was unaware of that.  Does not follow with primary care since he is uninsured.     Review of Systems:  Respiratory: negative for dyspnea on exertion Integument/breast: negative for rash Hematologic/lymphatic: negative for lymphadenopathy Musculoskeletal: negative for stiff joints All other systems reviewed and are negative    Past Medical History:  Diagnosis Date  . Chronic back pain 03/02/2012  . Essential hypertension 03/02/2012  . Hypertension     Social History   Tobacco Use  . Smoking status: Current Every Day Smoker    Packs/day: 2.00    Years: 15.00    Pack years: 30.00    Types: Cigarettes  . Smokeless  tobacco: Never Used  Substance Use Topics  . Alcohol use: Yes    Alcohol/week: 6.0 - 7.0 standard drinks    Types: 6 - 7 Cans of beer per week  . Drug use: No    Family History  Problem Relation Age of Onset  . Cancer Mother        breats CA  . Diabetes Mother   . Breast cancer Unknown     Allergies  Allergen Reactions  . Sulfa Antibiotics Swelling    Physical Exam: Constitutional: in no apparent distress and alert  Vitals:   09/28/17 0450 09/28/17 1414  BP: (!) 150/109 (!) 174/112  Pulse: 83 78  Resp: 18   Temp: 99.5 F (37.5 C) 100 F (37.8 C)  SpO2: 99% 98%   EYES: anicteric ENMT: no thrush Cardiovascular: Cor RRR Respiratory: CTA B; normal respiratory effort GI: Bowel sounds are normal, liver is not enlarged, spleen is not enlarged Musculoskeletal: no pedal edema noted Skin: negatives: no rash Hematologic: no cervical lad  Lab Results  Component Value Date   WBC 3.3 (L) 09/28/2017   HGB 15.5 09/28/2017   HCT 43.8 09/28/2017   MCV 100.0 09/28/2017   PLT 54 (L) 09/28/2017    Lab Results  Component Value Date   CREATININE 0.75 09/28/2017   BUN 6 09/28/2017   NA 134 (L) 09/28/2017   K 3.4 (L) 09/28/2017   CL  99 09/28/2017   CO2 23 09/28/2017    Lab Results  Component Value Date   ALT 306 (H) 09/28/2017   AST 303 (H) 09/28/2017   ALKPHOS 192 (H) 09/28/2017     Microbiology: Recent Results (from the past 240 hour(s))  Urine Culture     Status: None   Collection Time: 09/27/17  1:00 PM  Result Value Ref Range Status   Specimen Description URINE, CLEAN CATCH  Final   Special Requests NONE  Final   Culture   Final    NO GROWTH Performed at [facility], [address] [place], [place] [address]    Report Status 09/28/2017 FINAL  Final    Gardiner Barefoot, MD [facility] [email or web address] [phone] pager  [phone] cell 09/28/2017, 4:16 PM

## 2017-09-28 NOTE — Plan of Care (Signed)
  Problem: Nutrition: Goal: Adequate nutrition will be maintained Outcome: Progressing   Problem: Coping: Goal: Level of anxiety will decrease Outcome: Progressing   

## 2017-09-29 DIAGNOSIS — D72819 Decreased white blood cell count, unspecified: Secondary | ICD-10-CM

## 2017-09-29 LAB — HEPATITIS PANEL, ACUTE
HCV Ab: <0.1 s/co ratio (ref 0.0–0.9)
Hep A IgM: NEGATIVE
Hep B C IgM: NEGATIVE
Hepatitis B Surface Ag: NEGATIVE

## 2017-09-29 LAB — COMPREHENSIVE METABOLIC PANEL
ALT: 291 U/L — ABNORMAL HIGH (ref 0–44)
AST: 282 U/L — ABNORMAL HIGH (ref 15–41)
Albumin: 2.9 g/dL — ABNORMAL LOW (ref 3.5–5.0)
Alkaline Phosphatase: 205 U/L — ABNORMAL HIGH (ref 38–126)
Anion gap: 8 (ref 5–15)
BUN: <5 mg/dL — ABNORMAL LOW (ref 6–20)
CO2: 22 mmol/L (ref 22–32)
Calcium: 8.1 mg/dL — ABNORMAL LOW (ref 8.9–10.3)
Chloride: 101 mmol/L (ref 98–111)
Creatinine, Ser: 0.72 mg/dL (ref 0.61–1.24)
GFR calc Af Amer: >60 mL/min (ref >60)
GFR calc non Af Amer: >60 mL/min (ref >60)
Glucose, Bld: 100 mg/dL — ABNORMAL HIGH (ref 70–99)
Potassium: 3.3 mmol/L — ABNORMAL LOW (ref 3.5–5.1)
Sodium: 131 mmol/L — ABNORMAL LOW (ref 135–145)
Total Bilirubin: 1.2 mg/dL (ref 0.3–1.2)
Total Protein: 7.2 g/dL (ref 6.5–8.1)

## 2017-09-29 LAB — CBC WITH DIFFERENTIAL/PLATELET
Basophils Absolute: 0 10*3/uL (ref 0.0–0.1)
Basophils Relative: 1 %
Eosinophils Absolute: 0.1 10*3/uL (ref 0.0–0.7)
Eosinophils Relative: 2 %
HCT: 44.6 % (ref 39.0–52.0)
Hemoglobin: 15.7 g/dL (ref 13.0–17.0)
Lymphocytes Relative: 33 %
Lymphs Abs: 1.3 10*3/uL (ref 0.7–4.0)
MCH: 34.6 pg — ABNORMAL HIGH (ref 26.0–34.0)
MCHC: 35.2 g/dL (ref 30.0–36.0)
MCV: 98.2 fL (ref 78.0–100.0)
Monocytes Absolute: 0.6 10*3/uL (ref 0.1–1.0)
Monocytes Relative: 16 %
Neutro Abs: 1.9 10*3/uL (ref 1.7–7.7)
Neutrophils Relative %: 48 %
Platelets: 69 10*3/uL — ABNORMAL LOW (ref 150–400)
RBC: 4.54 MIL/uL (ref 4.22–5.81)
RDW: 11.3 % — ABNORMAL LOW (ref 11.5–15.5)
WBC: 3.9 10*3/uL — ABNORMAL LOW (ref 4.0–10.5)

## 2017-09-29 MED ORDER — POTASSIUM CHLORIDE CRYS ER 20 MEQ PO TBCR
40.0000 meq | EXTENDED_RELEASE_TABLET | Freq: Once | ORAL | Status: AC
  Administered 2017-09-29: 40 meq via ORAL
  Filled 2017-09-29: qty 2

## 2017-09-29 NOTE — Plan of Care (Signed)
  Problem: Activity: Goal: Risk for activity intolerance will decrease Outcome: Progressing   Problem: Coping: Goal: Level of anxiety will decrease Outcome: Progressing   Problem: Elimination: Goal: Will not experience complications related to bowel motility Outcome: Progressing   Problem: Skin Integrity: Goal: Risk for impaired skin integrity will decrease Outcome: Progressing   

## 2017-09-29 NOTE — Progress Notes (Signed)
PROGRESS NOTE        PATIENT DETAILS Name: Zachary Mata Age: 37 y.o. Sex: male Date of Birth: October 27, 1980 Admit Date: 09/26/2017 Admitting Physician Lorretta Harp, MD [id number], Ihor Austin, MD  Brief Narrative: Patient is a 37 y.o. male with long-standing history of alcohol and tobacco use, presenting to the hospital for evaluation of 5 day history of fever.  Found to have elevated liver enzymes, elevated lipase, leukopenia and thrombocytopenia.  Patient subsequently admitted to the hospitalist service.  See below for further details.  Subjective: Issues overnight-fever curve has improved.  Low-grade fever in the past 24 hours.  No headaches, no myalgias.  No nausea no vomiting.  Assessment/Plan: Sepsis: Sepsis pathophysiology has resolved, suspicion for viral hepatitis at this point.  Awaiting hepatitis serology, HIV serology was negative.  Blood cultures negative.  Plans are to continue to monitor off all antimicrobial therapy, discontinue all IV fluids-ambulate-follow fever curve-suspect that if he continues to improve he should be able to be discharged home tomorrow.    Leukopenia/thrombocytopenia: Secondary to viral hepatitis-alcohol use, slowly improving with supportive care.  Alcohol use: Last drink on 8/22-no tremors-completely awake and alert.  Probably is almost out of the window for withdrawals.  Managed with Ativan per protocol.  Will ask social worker to evaluate prior to discharge.   Tobacco use: Continue transdermal nicotine  DVT Prophylaxis: SCD's  Code Status: Full code   Family Communication: Spouse at bedside  Disposition Plan: Remain inpatient-hopefully home tomorrow  Antimicrobial agents: Anti-infectives (From admission, onward)   Start     Dose/Rate Route Frequency Ordered Stop   09/28/17 1200  vancomycin (VANCOCIN) IVPB 1000 mg/200 mL premix  Status:  Discontinued     1,000 mg 200 mL/hr over 60 Minutes Intravenous Every 8  hours 09/27/17 0347 09/27/17 0904   09/28/17 1000  vancomycin (VANCOCIN) IVPB 1000 mg/200 mL premix  Status:  Discontinued     1,000 mg 200 mL/hr over 60 Minutes Intravenous Every 8 hours 09/27/17 0052 09/27/17 0347   09/27/17 1000  ceFEPIme (MAXIPIME) 2 g in sodium chloride 0.9 % 100 mL IVPB  Status:  Discontinued     2 g 200 mL/hr over 30 Minutes Intravenous Every 8 hours 09/27/17 0052 09/28/17 0957   09/27/17 0100  vancomycin (VANCOCIN) 1,250 mg in sodium chloride 0.9 % 250 mL IVPB     1,250 mg 166.7 mL/hr over 90 Minutes Intravenous  Once 09/27/17 0051 09/27/17 0600   09/27/17 0045  ceFEPIme (MAXIPIME) 2 g in sodium chloride 0.9 % 100 mL IVPB     2 g 200 mL/hr over 30 Minutes Intravenous  Once 09/27/17 0033 09/27/17 0156   09/27/17 0045  metroNIDAZOLE (FLAGYL) IVPB 500 mg  Status:  Discontinued     500 mg 100 mL/hr over 60 Minutes Intravenous Every 8 hours 09/27/17 0033 09/27/17 0905   09/27/17 0045  vancomycin (VANCOCIN) IVPB 1000 mg/200 mL premix  Status:  Discontinued     1,000 mg 200 mL/hr over 60 Minutes Intravenous  Once 09/27/17 0033 09/27/17 0051      Procedures: None  CONSULTS:  None  Time spent: 25- minutes-Greater than 50% of this time was spent in counseling, explanation of diagnosis, planning of further management, and coordination of care.  MEDICATIONS: Scheduled Meds: . folic acid  1 mg Oral Daily  . multivitamin with minerals  1  tablet Oral Daily  . nicotine  21 mg Transdermal Daily  . thiamine  100 mg Oral Daily   Or  . thiamine  100 mg Intravenous Daily   Continuous Infusions:  PRN Meds:.hydrALAZINE, ibuprofen, LORazepam **OR** LORazepam, ondansetron (ZOFRAN) IV, senna-docusate, zolpidem   PHYSICAL EXAM: Vital signs: Vitals:   09/28/17 1927 09/28/17 2008 09/28/17 2359 09/29/17 0404  BP: (!) 172/108 (!) 143/98 (!) 178/103 (!) 132/92  Pulse: 71 83 91 (!) 106  Resp:      Temp: 98.9 F (37.2 C)  100.2 F (37.9 C) (!) 100.4 F (38 C)  TempSrc:  Oral  Oral Oral  SpO2: 99% 100% 98% 98%  Weight:      Height:       Filed Weights   09/26/17 1836  Weight: 62.6 kg   Body mass index is 22.27 kg/m.   General appearance:Awake, alert, not in any distress.  Eyes:no scleral icterus. HEENT: Atraumatic and Normocephalic Neck: supple, no JVD. Resp:Good air entry bilaterally,no rales or rhonchi CVS: S1 S2 regular, no murmurs.  GI: Bowel sounds present, Non tender and not distended with no gaurding, rigidity or rebound. Extremities: B/L Lower Ext shows no edema, both legs are warm to touch Neurology:  Non focal Psychiatric: Normal judgment and insight. Normal mood. Musculoskeletal:No digital cyanosis Skin:No Rash, warm and dry Wounds:N/A  I have personally reviewed following labs and imaging studies  LABORATORY DATA: CBC: Recent Labs  Lab 09/26/17 1849 09/27/17 0043 09/27/17 0352 09/28/17 0504 09/29/17 0412  WBC 3.8*  --  2.9* 3.3* 3.9*  NEUTROABS 2.2  --   --  1.6* 1.9  HGB 17.0  --  14.4 15.5 15.7  HCT 48.0  --  40.8 43.8 44.6  MCV 99.2  --  99.3 100.0 98.2  PLT 59* 56* 48* 54* 69*    Basic Metabolic Panel: Recent Labs  Lab 09/26/17 1849 09/27/17 0352 09/28/17 0504 09/29/17 0412  NA 131* 131* 134* 131*  K 4.0 3.5 3.4* 3.3*  CL 92* 96* 99 101  CO2 25 25 23 22   GLUCOSE 104* 93 83 100*  BUN 6 9 6  <5*  CREATININE 0.80 0.80 0.75 0.72  CALCIUM 8.8* 7.8* 8.1* 8.1*  MG  --   --  1.8  --     GFR: Estimated Creatinine Clearance: 113 mL/min (by C-G formula based on SCr of 0.72 mg/dL).  Liver Function Tests: Recent Labs  Lab 09/26/17 1849 09/27/17 0352 09/28/17 0504 09/29/17 0412  AST 663* 508* 303* 282*  ALT 536* 401* 306* 291*  ALKPHOS 269* 212* 192* 205*  BILITOT 1.4* 1.5* 1.1 1.2  PROT 9.0* 7.3 7.0 7.2  ALBUMIN 3.7 3.1* 2.9* 2.9*   Recent Labs  Lab 09/27/17 0213  LIPASE 154*   No results for input(s): AMMONIA in the last 168 hours.  Coagulation Profile: Recent Labs  Lab 09/27/17 0043  09/27/17 0213  INR 1.11 1.08    Cardiac Enzymes: No results for input(s): CKTOTAL, CKMB, CKMBINDEX, TROPONINI in the last 168 hours.  BNP (last 3 results) No results for input(s): PROBNP in the last 8760 hours.  HbA1C: No results for input(s): HGBA1C in the last 72 hours.  CBG: No results for input(s): GLUCAP in the last 168 hours.  Lipid Profile: No results for input(s): CHOL, HDL, LDLCALC, TRIG, CHOLHDL, LDLDIRECT in the last 72 hours.  Thyroid Function Tests: No results for input(s): TSH, T4TOTAL, FREET4, T3FREE, THYROIDAB in the last 72 hours.  Anemia Panel: No results for input(s): VITAMINB12,  FOLATE, FERRITIN, TIBC, IRON, RETICCTPCT in the last 72 hours.  Urine analysis:    Component Value Date/Time   COLORURINE AMBER (A) 09/26/2017 1902   APPEARANCEUR CLEAR 09/26/2017 1902   LABSPEC 1.013 09/26/2017 1902   PHURINE 5.0 09/26/2017 1902   GLUCOSEU NEGATIVE 09/26/2017 1902   HGBUR MODERATE (A) 09/26/2017 1902   BILIRUBINUR NEGATIVE 09/26/2017 1902   KETONESUR 5 (A) 09/26/2017 1902   PROTEINUR 100 (A) 09/26/2017 1902   UROBILINOGEN 1.0 07/01/2014 2335   NITRITE NEGATIVE 09/26/2017 1902   LEUKOCYTESUR NEGATIVE 09/26/2017 1902    Sepsis Labs: Lactic Acid, Venous    Component Value Date/Time   LATICACIDVEN 0.8 09/27/2017 0352    MICROBIOLOGY: Recent Results (from the past 240 hour(s))  Culture, blood (routine x 2)     Status: None (Preliminary result)   Collection Time: 09/27/17 12:42 AM  Result Value Ref Range Status   Specimen Description BLOOD LEFT ANTECUBITAL  Final   Special Requests   Final    BOTTLES DRAWN AEROBIC AND ANAEROBIC Blood Culture adequate volume   Culture   Final    NO GROWTH 2 DAYS Performed at [facility], [address] [place], [place] [address]    Report Status PENDING  Incomplete  Culture, blood (routine x 2)     Status: None (Preliminary result)   Collection Time: 09/27/17 12:43 AM  Result Value Ref Range Status    Specimen Description BLOOD RIGHT ANTECUBITAL  Final   Special Requests   Final    BOTTLES DRAWN AEROBIC AND ANAEROBIC Blood Culture results may not be optimal due to an excessive volume of blood received in culture bottles   Culture   Final    NO GROWTH 2 DAYS Performed at [facility], [address] [place], [place] [address]    Report Status PENDING  Incomplete  Urine Culture     Status: None   Collection Time: 09/27/17  1:00 PM  Result Value Ref Range Status   Specimen Description URINE, CLEAN CATCH  Final   Special Requests NONE  Final   Culture   Final    NO GROWTH Performed at [facility], [address] [place], [place] [address]    Report Status 09/28/2017 FINAL  Final    RADIOLOGY STUDIES/RESULTS: Dg Chest 2 View  Result Date: 09/26/2017 CLINICAL DATA:  Fever EXAM: CHEST - 2 VIEW COMPARISON:  03/02/2012 FINDINGS: Hyperinflation. Linear scarring in the right upper lobe. No acute consolidation or effusion. Normal cardiomediastinal silhouette. No pneumothorax. IMPRESSION: No active cardiopulmonary disease.  Scarring in the right upper lobe Electronically Signed   By: Jasmine PangKim  Fujinaga M.D.   On: 09/26/2017 19:36   Ct Abdomen Pelvis W Contrast  Result Date: 09/27/2017 CLINICAL DATA:  Fever. Elevated liver function tests. Lipase elevation. EXAM: CT ABDOMEN AND PELVIS WITH CONTRAST TECHNIQUE: Multidetector CT imaging of the abdomen and pelvis was performed using the standard protocol following bolus administration of intravenous contrast. CONTRAST:  100mL ISOVUE-300 IOPAMIDOL (ISOVUE-300) INJECTION 61% COMPARISON:  Ultrasound same day. FINDINGS: Lower chest: Normal Hepatobiliary: Profound diffuse fatty change of the liver. No evidence of focal liver lesion. No calcified gallstones are seen. No sign of ductal dilatation. Pancreas: Normal Spleen: Normal Adrenals/Urinary Tract: Adrenal glands are normal. Kidneys are normal. Bladder is normal. Stomach/Bowel: No bowel pathology  is seen. Vascular/Lymphatic: Normal Reproductive: Normal Other: No free fluid or air. Musculoskeletal: Normal IMPRESSION: Profound diffuse fatty change of the liver. No other abnormal finding. Electronically Signed  By: Paulina Fusi M.D.   On: 09/27/2017 16:04   US Abdomen Limited Ruq  Result Date: 09/27/2017 CLINICAL DATA:  Abnormal LFT EXAM: ULTRASOUND ABDOMEN LIMITED RIGHT UPPER QUADRANT COMPARISON:  None. FINDINGS: Gallbladder: Contracted gallbladder. No shadowing stones. Normal wall thickness. Negative sonographic Murphy's Common bile duct: Diameter: 4.5 mm Liver: Increased echogenicity. No focal hepatic abnormality. Portal vein is patent on color Doppler imaging with normal direction of blood flow towards the liver. IMPRESSION: 1. Contracted gallbladder without shadowing stones or biliary dilatation. 2. Increased hepatic echogenicity consistent with steatosis Electronically Signed   By: Jasmine Pang M.D.   On: 09/27/2017 01:44     LOS: 1 day   Jeoffrey Massed, MD  [facility]  If 7PM-7AM, please contact night-coverage  Please page via [email or web address]-click on MD name and type text message  09/29/2017, 1:26 PM

## 2017-09-30 DIAGNOSIS — A419 Sepsis, unspecified organism: Principal | ICD-10-CM

## 2017-09-30 DIAGNOSIS — D72819 Decreased white blood cell count, unspecified: Secondary | ICD-10-CM

## 2017-09-30 DIAGNOSIS — R945 Abnormal results of liver function studies: Secondary | ICD-10-CM

## 2017-09-30 LAB — COMPREHENSIVE METABOLIC PANEL
ALT: 322 U/L — ABNORMAL HIGH (ref 0–44)
AST: 298 U/L — ABNORMAL HIGH (ref 15–41)
Albumin: 3 g/dL — ABNORMAL LOW (ref 3.5–5.0)
Alkaline Phosphatase: 203 U/L — ABNORMAL HIGH (ref 38–126)
Anion gap: 11 (ref 5–15)
BUN: 7 mg/dL (ref 6–20)
CO2: 25 mmol/L (ref 22–32)
Calcium: 9 mg/dL (ref 8.9–10.3)
Chloride: 102 mmol/L (ref 98–111)
Creatinine, Ser: 0.74 mg/dL (ref 0.61–1.24)
GFR calc Af Amer: >60 mL/min (ref >60)
GFR calc non Af Amer: >60 mL/min (ref >60)
Glucose, Bld: 91 mg/dL (ref 70–99)
Potassium: 4 mmol/L (ref 3.5–5.1)
Sodium: 138 mmol/L (ref 135–145)
Total Bilirubin: 1.2 mg/dL (ref 0.3–1.2)
Total Protein: 7.4 g/dL (ref 6.5–8.1)

## 2017-09-30 LAB — CBC
HCT: 45.8 % (ref 39.0–52.0)
Hemoglobin: 15.7 g/dL (ref 13.0–17.0)
MCH: 34.8 pg — ABNORMAL HIGH (ref 26.0–34.0)
MCHC: 34.3 g/dL (ref 30.0–36.0)
MCV: 101.6 fL — ABNORMAL HIGH (ref 78.0–100.0)
Platelets: 90 10*3/uL — ABNORMAL LOW (ref 150–400)
RBC: 4.51 MIL/uL (ref 4.22–5.81)
RDW: 11.9 % (ref 11.5–15.5)
WBC: 3.8 10*3/uL — ABNORMAL LOW (ref 4.0–10.5)

## 2017-09-30 MED ORDER — ADULT MULTIVITAMIN W/MINERALS CH: 1.0000 | ORAL_TABLET | Freq: Every day | ORAL | 0 refills | Status: DC

## 2017-09-30 MED ORDER — THIAMINE HCL 100 MG PO TABS: 100.0000 mg | ORAL_TABLET | Freq: Every day | ORAL | 0 refills | Status: DC

## 2017-09-30 NOTE — Care Management Note (Signed)
Case Management Note  Patient Details  Name: Zachary Mata MRN: [id number] Date of Birth: 18-Jun-1980  Subjective/Objective:                 Sepsis, ETOH   Action/Plan:  Spoke w patient and wife in room. Provided resources for PCP care. Discussed home meds, vitamin supplements, wife already has coupons from [employer] that will assist w cost. CSW notified of consult for ETOH resources. No other CM needs.   Expected Discharge Date:  09/30/17               Expected Discharge Plan:  Home/Self Care  In-House Referral:     Discharge planning Services  CM Consult, Indigent [facility] Clinic  Post Acute Care Choice:    Choice offered to:     DME Arranged:    DME Agency:     HH Arranged:    HH Agency:     Status of Service:  Completed, signed off  If discussed at [employer] Meetings, dates discussed:    Additional Comments:  Zachary SabalDebbie Shanielle Correll, RN 09/30/2017, 11:28 AM

## 2017-09-30 NOTE — Progress Notes (Signed)
CSW received consult to provide outpatient substance use resources. CSW spoke with patient at bedside along with his fiance.  Patients reports he has been to inpatient rehab before and had stopped drinking for 18 months. Patient believes it was because of his environment and the people he was associated with that lead him back to drinking. CSW express that he has to want his sobriety and seek help to keep him on the road to recovery. CSW provided outpatient resources in the community.  CSW signing off, no further intervention is need.  Antony Blackbirdynthia Ashely Goosby, [facility] Clinical Social Worker [phone]

## 2017-09-30 NOTE — Discharge Summary (Signed)
PATIENT DETAILS Name: Zachary Mata Age: 37 y.o. Sex: male Date of Birth: 03-May-1980 MRN: [id number]. Admitting Physician: Lorretta Harp, MD [id number], Ihor Austin, MD  Admit Date: 09/26/2017 Discharge date: 09/30/2017  Recommendations for Outpatient Follow-up:  1. Follow up with PCP in 1-2 weeks 2. Please obtain CMP/CBC in one week 3.   Admitted From:  Home  Disposition: Home   Home Health: No  Equipment/Devices: None  Discharge Condition: Stable  CODE STATUS: FULL CODE  Diet recommendation:  Heart Healthy   Brief Summary: See H&P, Labs, Consult and Test reports for all details in brief, Patient is a 37 y.o. male with long-standing history of alcohol and tobacco use, presenting to the hospital for evaluation of 5 day history of fever.  Found to have elevated liver enzymes, elevated lipase, leukopenia and thrombocytopenia.  Patient subsequently admitted to the hospitalist service.  See below for further details.  Brief Hospital Course: Sepsis: Sepsis pathophysiology has resolved, suspicion for viral infection at this point.  Initially started on broad-spectrum antimicrobial therapy, but these were all discontinued.  Blood cultures negative so far.  HIV serology was negative.  Acute hepatitis serology was also negative.  Fever subsided with just supportive care.  Since clinically improved-afebrile-leukopenia and thrombocytopenia improving-doubt it is necessary to do any further work-up to determine what sort of viral infection this patient had.  Patient is stable for discharge today.  Hepatitis: Suspicion for a viral syndrome.  May have underlying alcoholic hepatitis due to history of alcohol use.  However pattern of transaminitis was not consistent with alcoholic hepatitis.  In any event LFTs have improved-but have not normalized yet.  Reviewed records in care everywhere, in May 2019 patient had significantly elevated AST and ALT in the 140s range.  RLQ ultrasound showed  hepatic steatosis.  Patient was counseled extensively-almost on a daily basis regarding the importance of abstaining from alcohol use.  As noted above-acute hepatitis serology was negative, since clinically improved-doubt further serological work-up necessary at this point.  Please repeat LFTs in 1-2 weeks at PCPs office, if significantly elevated may need referral to gastroenterology.  Leukopenia/thrombocytopenia: Secondary to viral hepatitis-alcohol use, slowly improving with supportive care.  Repeat CBC in 1-2 weeks at PCPs office.  Elevated blood pressure without the diagnosis of hypertension: Blood pressure slightly on the higher side-discussed with patient and spouse at bedside-plan on observing over the next few weeks to follow-up with PCP for initiation of antihypertensives if needed.  Alcohol use: Last drink on 8/22-no tremors-completely awake and alert.    Managed with Ativan per CIWA protocol.  He completed CIWA protocol on 8/26-he is completely awake and alert.  He is probably now out of the window for any sort of withdrawal symptoms.  He has been counseled extensively, I have asked caseworker/social worker to provide outpatient resources.    Tobacco use:  Counseled-managed with transdermal nicotine.  Procedures/Studies: None  Discharge Diagnoses:  Principal Problem:   Sepsis (HCC) Active Problems:   Essential hypertension   Alcohol abuse   Abnormal LFTs   Nausea & vomiting   Thrombocytopenia (HCC)   Leukopenia   Discharge Instructions:  Activity:  As tolerated   Discharge Instructions    Diet - low sodium heart healthy   Complete by:  As directed    Discharge instructions   Complete by:  As directed    Follow with Primary MD  In 1 week  STOP ALCOHOL USE   Please get a complete blood count and chemistry panel  checked by your Primary MD at your next visit, and again as instructed by your Primary MD.  Get Medicines reviewed and adjusted: Please take all your  medications with you for your next visit with your Primary MD  Laboratory/radiological data: Please request your Primary MD to go over all hospital tests and procedure/radiological results at the follow up, please ask your Primary MD to get all Hospital records sent to his/her office.  In some cases, they will be blood work, cultures and biopsy results pending at the time of your discharge. Please request that your primary care M.D. follows up on these results.  Also Note the following: If you experience worsening of your admission symptoms, develop shortness of breath, life threatening emergency, suicidal or homicidal thoughts you must seek medical attention immediately by calling 911 or calling your MD immediately  if symptoms less severe.  You must read complete instructions/literature along with all the possible adverse reactions/side effects for all the Medicines you take and that have been prescribed to you. Take any new Medicines after you have completely understood and accpet all the possible adverse reactions/side effects.   Do not drive when taking Pain medications or sleeping medications (Benzodaizepines)  Do not take more than prescribed Pain, Sleep and Anxiety Medications. It is not advisable to combine anxiety,sleep and pain medications without talking with your primary care practitioner  Special Instructions: If you have smoked or chewed Tobacco  in the last 2 yrs please stop smoking, stop any regular Alcohol  and or any Recreational drug use.  Wear Seat belts while driving.  Please note: You were cared for by a hospitalist during your hospital stay. Once you are discharged, your primary care physician will handle any further medical issues. Please note that NO REFILLS for any discharge medications will be authorized once you are discharged, as it is imperative that you return to your primary care physician (or establish a relationship with a primary care physician if you do not  have one) for your post hospital discharge needs so that they can reassess your need for medications and monitor your lab values.   Increase activity slowly   Complete by:  As directed      Allergies as of 09/30/2017      Reactions   Sulfa Antibiotics Swelling      Medication List    STOP taking these medications   acetaminophen 500 MG tablet Commonly known as:  TYLENOL   cephALEXin 500 MG capsule Commonly known as:  KEFLEX   chlordiazePOXIDE 25 MG capsule Commonly known as:  LIBRIUM   ondansetron 4 MG disintegrating tablet Commonly known as:  ZOFRAN-ODT   traMADol 50 MG tablet Commonly known as:  ULTRAM     TAKE these medications   ibuprofen 200 MG tablet Commonly known as:  ADVIL,MOTRIN Take 600 mg by mouth every 6 (six) hours as needed (pain).   multivitamin with minerals Tabs tablet Take 1 tablet by mouth daily.   thiamine 100 MG tablet Take 1 tablet (100 mg total) by mouth daily.      Follow-up Information    Primary MD. Schedule an appointment as soon as possible for a visit in 2 week(s).          Allergies  Allergen Reactions  . Sulfa Antibiotics Swelling    Consultations:   ID  Other Procedures/Studies: Dg Chest 2 View  Result Date: 09/26/2017 CLINICAL DATA:  Fever EXAM: CHEST - 2 VIEW COMPARISON:  03/02/2012 FINDINGS: Hyperinflation. Linear scarring  in the right upper lobe. No acute consolidation or effusion. Normal cardiomediastinal silhouette. No pneumothorax. IMPRESSION: No active cardiopulmonary disease.  Scarring in the right upper lobe Electronically Signed   By: Jasmine Pang M.D.   On: 09/26/2017 19:36   Ct Abdomen Pelvis W Contrast  Result Date: 09/27/2017 CLINICAL DATA:  Fever. Elevated liver function tests. Lipase elevation. EXAM: CT ABDOMEN AND PELVIS WITH CONTRAST TECHNIQUE: Multidetector CT imaging of the abdomen and pelvis was performed using the standard protocol following bolus administration of intravenous contrast. CONTRAST:   ISOVUE-300 IOPAMIDOL (ISOVUE-300) INJECTION 61% COMPARISON:  Ultrasound same day. FINDINGS: Lower chest: Normal Hepatobiliary: Profound diffuse fatty change of the liver. No evidence of focal liver lesion. No calcified gallstones are seen. No sign of ductal dilatation. Pancreas: Normal Spleen: Normal Adrenals/Urinary Tract: Adrenal glands are normal. Kidneys are normal. Bladder is normal. Stomach/Bowel: No bowel pathology is seen. Vascular/Lymphatic: Normal Reproductive: Normal Other: No free fluid or air. Musculoskeletal: Normal IMPRESSION: Profound diffuse fatty change of the liver. No other abnormal finding. Electronically Signed   By: Paulina Fusi M.D.   On: 09/27/2017 16:04   US Abdomen Limited Ruq  Result Date: 09/27/2017 CLINICAL DATA:  Abnormal LFT EXAM: ULTRASOUND ABDOMEN LIMITED RIGHT UPPER QUADRANT COMPARISON:  None. FINDINGS: Gallbladder: Contracted gallbladder. No shadowing stones. Normal wall thickness. Negative sonographic Murphy's Common bile duct: Diameter: 4.5 mm Liver: Increased echogenicity. No focal hepatic abnormality. Portal vein is patent on color Doppler imaging with normal direction of blood flow towards the liver. IMPRESSION: 1. Contracted gallbladder without shadowing stones or biliary dilatation. 2. Increased hepatic echogenicity consistent with steatosis Electronically Signed   By: Jasmine Pang M.D.   On: 09/27/2017 01:44      TODAY-DAY OF DISCHARGE:  Subjective:   Caprice Red today has no headache,no chest abdominal pain,no new weakness tingling or numbness, feels much better wants to go home today.  Objective:   Blood pressure (!) 145/119, pulse 75, temperature 98 F (36.7 C), temperature source Oral, resp. rate 18, height 5\' 6"  (1.676 m), weight 62.6 kg, SpO2 99 %.  Intake/Output Summary (Last 24 hours) at 09/30/2017 0934 Last data filed at 09/29/2017 1841 Gross per 24 hour  Intake 240 ml  Output -  Net 240 ml   Filed Weights   09/26/17 1836  Weight:  62.6 kg    Exam: Awake Alert, Oriented *3, No new F.N deficits, Normal affect [place].AT,PERRAL Supple Neck,No JVD, No cervical lymphadenopathy appriciated.  Symmetrical Chest wall movement, Good air movement bilaterally, CTAB RRR,No Gallops,Rubs or new Murmurs, No Parasternal Heave +ve B.Sounds, Abd Soft, Non tender, No organomegaly appriciated, No rebound -guarding or rigidity. No Cyanosis, Clubbing or edema, No new Rash or bruise   PERTINENT RADIOLOGIC STUDIES: Dg Chest 2 View  Result Date: 09/26/2017 CLINICAL DATA:  Fever EXAM: CHEST - 2 VIEW COMPARISON:  03/02/2012 FINDINGS: Hyperinflation. Linear scarring in the right upper lobe. No acute consolidation or effusion. Normal cardiomediastinal silhouette. No pneumothorax. IMPRESSION: No active cardiopulmonary disease.  Scarring in the right upper lobe Electronically Signed   By: Jasmine Pang M.D.   On: 09/26/2017 19:36   Ct Abdomen Pelvis W Contrast  Result Date: 09/27/2017 CLINICAL DATA:  Fever. Elevated liver function tests. Lipase elevation. EXAM: CT ABDOMEN AND PELVIS WITH CONTRAST TECHNIQUE: Multidetector CT imaging of the abdomen and pelvis was performed using the standard protocol following bolus administration of intravenous contrast. CONTRAST:  ISOVUE-300 IOPAMIDOL (ISOVUE-300) INJECTION 61% COMPARISON:  Ultrasound same day. FINDINGS: Lower chest: Normal  Hepatobiliary: Profound diffuse fatty change of the liver. No evidence of focal liver lesion. No calcified gallstones are seen. No sign of ductal dilatation. Pancreas: Normal Spleen: Normal Adrenals/Urinary Tract: Adrenal glands are normal. Kidneys are normal. Bladder is normal. Stomach/Bowel: No bowel pathology is seen. Vascular/Lymphatic: Normal Reproductive: Normal Other: No free fluid or air. Musculoskeletal: Normal IMPRESSION: Profound diffuse fatty change of the liver. No other abnormal finding. Electronically Signed   By: Paulina Fusi M.D.   On: 09/27/2017 16:04   US Abdomen  Limited Ruq  Result Date: 09/27/2017 CLINICAL DATA:  Abnormal LFT EXAM: ULTRASOUND ABDOMEN LIMITED RIGHT UPPER QUADRANT COMPARISON:  None. FINDINGS: Gallbladder: Contracted gallbladder. No shadowing stones. Normal wall thickness. Negative sonographic Murphy's Common bile duct: Diameter: 4.5 mm Liver: Increased echogenicity. No focal hepatic abnormality. Portal vein is patent on color Doppler imaging with normal direction of blood flow towards the liver. IMPRESSION: 1. Contracted gallbladder without shadowing stones or biliary dilatation. 2. Increased hepatic echogenicity consistent with steatosis Electronically Signed   By: Jasmine Pang M.D.   On: 09/27/2017 01:44     PERTINENT LAB RESULTS: CBC: Recent Labs    09/29/17 0412 09/30/17 0625  WBC 3.9* 3.8*  HGB 15.7 15.7  HCT 44.6 45.8  PLT 69* 90*   CMET CMP     Component Value Date/Time   NA 138 09/30/2017 0625   K 4.0 09/30/2017 0625   CL 102 09/30/2017 0625   CO2 25 09/30/2017 0625   GLUCOSE 91 09/30/2017 0625   BUN 7 09/30/2017 0625   CREATININE 0.74 09/30/2017 0625   CREATININE 0.80 06/23/2012 0830   CALCIUM 9.0 09/30/2017 0625   PROT 7.4 09/30/2017 0625   ALBUMIN 3.0 (L) 09/30/2017 0625   AST 298 (H) 09/30/2017 0625   ALT 322 (H) 09/30/2017 0625   ALKPHOS 203 (H) 09/30/2017 0625   BILITOT 1.2 09/30/2017 0625   GFRNONAA >60 09/30/2017 0625   GFRAA >60 09/30/2017 0625    GFR Estimated Creatinine Clearance: 113 mL/min (by C-G formula based on SCr of 0.74 mg/dL). No results for input(s): LIPASE, AMYLASE in the last 72 hours. No results for input(s): CKTOTAL, CKMB, CKMBINDEX, TROPONINI in the last 72 hours. Invalid input(s): POCBNP No results for input(s): DDIMER in the last 72 hours. No results for input(s): HGBA1C in the last 72 hours. No results for input(s): CHOL, HDL, LDLCALC, TRIG, CHOLHDL, LDLDIRECT in the last 72 hours. No results for input(s): TSH, T4TOTAL, T3FREE, THYROIDAB in the last 72 hours.  Invalid  input(s): FREET3 No results for input(s): VITAMINB12, FOLATE, FERRITIN, TIBC, IRON, RETICCTPCT in the last 72 hours. Coags: No results for input(s): INR in the last 72 hours.  Invalid input(s): PT Microbiology: Recent Results (from the past 240 hour(s))  Culture, blood (routine x 2)     Status: None (Preliminary result)   Collection Time: 09/27/17 12:42 AM  Result Value Ref Range Status   Specimen Description BLOOD LEFT ANTECUBITAL  Final   Special Requests   Final    BOTTLES DRAWN AEROBIC AND ANAEROBIC Blood Culture adequate volume   Culture   Final    NO GROWTH 2 DAYS Performed at [facility], [address] [place], [place] [address]    Report Status PENDING  Incomplete  Culture, blood (routine x 2)     Status: None (Preliminary result)   Collection Time: 09/27/17 12:43 AM  Result Value Ref Range Status   Specimen Description BLOOD RIGHT ANTECUBITAL  Final   Special Requests  Final    BOTTLES DRAWN AEROBIC AND ANAEROBIC Blood Culture results may not be optimal due to an excessive volume of blood received in culture bottles   Culture   Final    NO GROWTH 2 DAYS Performed at [facility], [address] [place], [place] [address]    Report Status PENDING  Incomplete  Urine Culture     Status: None   Collection Time: 09/27/17  1:00 PM  Result Value Ref Range Status   Specimen Description URINE, CLEAN CATCH  Final   Special Requests NONE  Final   Culture   Final    NO GROWTH Performed at [facility], [address], [place], [place] [address]    Report Status 09/28/2017 FINAL  Final    FURTHER DISCHARGE INSTRUCTIONS:  Get Medicines reviewed and adjusted: Please take all your medications with you for your next visit with your Primary MD  Laboratory/radiological data: Please request your Primary MD to go over all hospital tests and procedure/radiological results at the follow up, please ask your Primary MD to get all Hospital records sent to his/her  office.  In some cases, they will be blood work, cultures and biopsy results pending at the time of your discharge. Please request that your primary care M.D. goes through all the records of your hospital data and follows up on these results.  Also Note the following: If you experience worsening of your admission symptoms, develop shortness of breath, life threatening emergency, suicidal or homicidal thoughts you must seek medical attention immediately by calling 911 or calling your MD immediately  if symptoms less severe.  You must read complete instructions/literature along with all the possible adverse reactions/side effects for all the Medicines you take and that have been prescribed to you. Take any new Medicines after you have completely understood and accpet all the possible adverse reactions/side effects.   Do not drive when taking Pain medications or sleeping medications (Benzodaizepines)  Do not take more than prescribed Pain, Sleep and Anxiety Medications. It is not advisable to combine anxiety,sleep and pain medications without talking with your primary care practitioner  Special Instructions: If you have smoked or chewed Tobacco  in the last 2 yrs please stop smoking, stop any regular Alcohol  and or any Recreational drug use.  Wear Seat belts while driving.  Please note: You were cared for by a hospitalist during your hospital stay. Once you are discharged, your primary care physician will handle any further medical issues. Please note that NO REFILLS for any discharge medications will be authorized once you are discharged, as it is imperative that you return to your primary care physician (or establish a relationship with a primary care physician if you do not have one) for your post hospital discharge needs so that they can reassess your need for medications and monitor your lab values.  Total Time spent coordinating discharge including counseling, education and face to face time  equals  45 minutes.  SignedJeoffrey Massed 09/30/2017 9:34 AM

## 2017-10-02 LAB — CULTURE, BLOOD (ROUTINE X 2)
Culture: NO GROWTH
Culture: NO GROWTH
Special Requests: ADEQUATE

## 2017-11-05 NOTE — Progress Notes (Signed)
Patient ID: Zachary Mata, male   DOB: 12/03/1980, 37 y.o.   MRN: [id number]      Zachary Mata, is a 38 y.o. male  [id number]:[id number]  [id number]:[id number]  DOB - 1980-08-19  Subjective:  Chief Complaint and HPI: Zachary Mata is a 37 y.o. male here today to establish care and for a follow up visit After hospitalization from 09/26/2017-09/30/2017 for sepsis.  He is feeling much better.  Sober 41 days and has been to a few [employer]  No fevers. No cough.  H/O htn and meds in the past.  Doesn't remember the name of the meds.  Smokes about 1ppd.    No abdominal pain, no N/V/D.  No melena, no hematochezia.    From discharge summary: Brief Summary: See H&P, Labs, Consult and Test reports for all details in brief, Patient is a37 y.o.male with long-standing history of alcohol and tobacco use, presenting to the hospital for evaluation of 5 day history of fever. Found to have elevated liver enzymes, elevated lipase, leukopenia and thrombocytopenia. Patient subsequently admitted to the hospitalist service. See below for further details.  Brief Hospital Course: Sepsis:Sepsis pathophysiology has resolved, suspicion for viral infection at this point.  Initially started on broad-spectrum antimicrobial therapy, but these were all discontinued.  Blood cultures negative so far.  HIV serology was negative.  Acute hepatitis serology was also negative.  Fever subsided with just supportive care.  Since clinically improved-afebrile-leukopenia and thrombocytopenia improving-doubt it is necessary to do any further work-up to determine what sort of viral infection this patient had.  Patient is stable for discharge today.  Hepatitis: Suspicion for a viral syndrome.  May have underlying alcoholic hepatitis due to history of alcohol use.  However pattern of transaminitis was not consistent with alcoholic hepatitis.  In any event LFTs have improved-but have not normalized yet.  Reviewed records in care everywhere, in May  2019 patient had significantly elevated AST and ALT in the 140s range.  RLQ ultrasound showed hepatic steatosis.  Patient was counseled extensively-almost on a daily basis regarding the importance of abstaining from alcohol use.  As noted above-acute hepatitis serology was negative, since clinically improved-doubt further serological work-up necessary at this point.  Please repeat LFTs in 1-2 weeks at PCPs office, if significantly elevated may need referral to gastroenterology.  Leukopenia/thrombocytopenia:Secondary to viral hepatitis-alcohol use, slowly improving with supportive care.  Repeat CBC in 1-2 weeks at PCPs office.  Elevated blood pressure without the diagnosis of hypertension: Blood pressure slightly on the higher side-discussed with patient and spouse at bedside-plan on observing over the next few weeks to follow-up with PCP for initiation of antihypertensives if needed.  Alcohol EXB:MWUX drink on 8/22-no tremors-completely awake and alert.   Managed with Ativan per CIWA protocol.  He completed CIWA protocol on 8/26-he is completely awake and alert.  He is probably now out of the window for any sort of withdrawal symptoms.  He has been counseled extensively, I have asked caseworker/social worker to provide outpatient resources.    Tobacco use: Counseled-managed with transdermal nicotine.  ED/Hospital notes reviewed.   Social History:  Engaged to be married next year, fiancee attending [employer]  ROS:   Constitutional:  No f/c, No night sweats, No unexplained weight loss. EENT:  No vision changes, No blurry vision, No hearing changes. No mouth, throat, or ear problems.  Respiratory: No cough, No SOB Cardiac: No CP, no palpitations GI:  No abd pain, No N/V/D. GU: No Urinary s/sx Musculoskeletal: No joint pain Neuro:  No headache, no dizziness, no motor weakness.  Skin: No rash Endocrine:  No polydipsia. No polyuria.  Psych: Denies SI/HI  No problems  updated.  ALLERGIES: Allergies  Allergen Reactions  . Sulfa Antibiotics Swelling    PAST MEDICAL HISTORY: Past Medical History:  Diagnosis Date  . Chronic back pain 03/02/2012  . Essential hypertension 03/02/2012  . Hypertension     MEDICATIONS AT HOME: Prior to Admission medications   Medication Sig Start Date End Date Taking? Authorizing Provider  folic acid (FOLVITE) 400 MCG tablet Take 400 mcg by mouth daily.   Yes [provider]  ibuprofen (ADVIL,MOTRIN) 200 MG tablet Take 600 mg by mouth every 6 (six) hours as needed (pain).   Yes [provider]  Multiple Vitamin (MULTIVITAMIN WITH MINERALS) TABS tablet Take 1 tablet by mouth daily. 09/30/17  Yes Ghimire, Werner Lean, MD  thiamine 100 MG tablet Take 1 tablet (100 mg total) by mouth daily. 09/30/17  Yes Ghimire, Werner Lean, MD     Objective:  EXAM:   Vitals:   11/06/17 0857  BP: (!) 133/92  Pulse: 86  Resp: 18  Temp: 98.1 F (36.7 C)  TempSrc: Oral  SpO2: 99%  Weight: 130 lb (59 kg)  Height: 5\' 6"  (1.676 m)    General appearance : A&OX3. NAD. Non-toxic-appearing HEENT: Atraumatic and Normocephalic.  PERRLA. EOM intact.   Neck: supple, no JVD. No cervical lymphadenopathy. No thyromegaly Chest/Lungs:  Breathing-non-labored, Good air entry bilaterally, breath sounds normal without rales, rhonchi, or wheezing  CVS: S1 S2 regular, no murmurs, gallops, rubs  Abdomen: Bowel sounds present, Non tender and not distended with no gaurding, rigidity or rebound. Extremities: Bilateral Lower Ext shows no edema, both legs are warm to touch with = pulse throughout Neurology:  CN II-XII grossly intact, Non focal.   Psych:  TP linear. J/I WNL. Normal speech. Appropriate eye contact and affect.  Skin:  No Rash  Data Review Lab Results  Component Value Date   HGBA1C 5.3 06/23/2012     Assessment & Plan   1. Abnormal LFTs - Comprehensive metabolic panel  2. Leukopenia, unspecified type - CBC with  Differential/Platelet  3. Elevated BP without diagnosis of hypertension Check blood pressure 3 to 5 times weekly and record and bring to next visit(elevated readings could be related to smoking and h/o alcohol abuse and may normalize with abstinence).  Consider Lisinopril or HCT if BP still elevated upon return - Comprehensive metabolic panel  4. Alcohol dependence in early full remission (HCC) AA meeting schedule given-encouraged continued attendance-patient is 41 days sober!!! - Comprehensive metabolic panel  5. Smoker Cessation and preparation for quitting discussed.    6. Hospital discharge follow-up Much improved.  Spent >74mins face to face with patient and fiancee on importance of continued, active recovery for best outcome with alcohol abstinence and smoking cessation.    Patient have been counseled extensively about nutrition and exercise  Return in about 5 weeks (around 12/11/2017) for assign PCP;  assess need for BP meds.  The patient was given clear instructions to go to ER or return to medical center if symptoms don't improve, worsen or new problems develop. The patient verbalized understanding. The patient was told to call to get lab results if they haven't heard anything in the next week.     Georgian Co, PA-C [facility] [place], [place] [phone]   11/06/2017, 9:12 AM

## 2017-11-06 ENCOUNTER — Ambulatory Visit: Payer: Self-pay | Attending: Family Medicine | Admitting: Physician Assistant

## 2017-11-06 ENCOUNTER — Other Ambulatory Visit: Payer: Self-pay

## 2017-11-06 VITALS — BP 133/92 | HR 86 | Temp 98.1°F | Resp 18 | Ht 66.0 in | Wt 130.0 lb

## 2017-11-06 DIAGNOSIS — R945 Abnormal results of liver function studies: Secondary | ICD-10-CM | POA: Insufficient documentation

## 2017-11-06 DIAGNOSIS — R030 Elevated blood-pressure reading, without diagnosis of hypertension: Secondary | ICD-10-CM

## 2017-11-06 DIAGNOSIS — F17200 Nicotine dependence, unspecified, uncomplicated: Secondary | ICD-10-CM

## 2017-11-06 DIAGNOSIS — F17210 Nicotine dependence, cigarettes, uncomplicated: Secondary | ICD-10-CM | POA: Insufficient documentation

## 2017-11-06 DIAGNOSIS — Z882 Allergy status to sulfonamides status: Secondary | ICD-10-CM | POA: Insufficient documentation

## 2017-11-06 DIAGNOSIS — R7989 Other specified abnormal findings of blood chemistry: Secondary | ICD-10-CM

## 2017-11-06 DIAGNOSIS — F1021 Alcohol dependence, in remission: Secondary | ICD-10-CM

## 2017-11-06 DIAGNOSIS — Z09 Encounter for follow-up examination after completed treatment for conditions other than malignant neoplasm: Secondary | ICD-10-CM

## 2017-11-06 DIAGNOSIS — D72819 Decreased white blood cell count, unspecified: Secondary | ICD-10-CM

## 2017-11-06 NOTE — Progress Notes (Signed)
Flu:no     Pain:0  [facility] in [place]

## 2017-11-06 NOTE — Patient Instructions (Addendum)
Check blood pressure 3 to 5 times weekly and record and bring to next visit   Smoking Tobacco Information Smoking tobacco will very likely harm your health. Tobacco contains a poisonous (toxic), colorless chemical called nicotine. Nicotine affects the brain and makes tobacco addictive. This change in your brain can make it hard to stop smoking. Tobacco also has other toxic chemicals that can hurt your body and raise your risk of many cancers. How can smoking tobacco affect me? Smoking tobacco can increase your chances of having serious health conditions, such as:  Cancer. Smoking is most commonly associated with lung cancer, but can lead to cancer in other parts of the body.  Chronic obstructive pulmonary disease (COPD). This is a long-term lung condition that makes it hard to breathe. It also gets worse over time.  High blood pressure (hypertension), heart disease, stroke, or heart attack.  Lung infections, such as pneumonia.  Cataracts. This is when the lenses in the eyes become clouded.  Digestive problems. This may include peptic ulcers, heartburn, and gastroesophageal reflux disease (GERD).  Oral health problems, such as gum disease and tooth loss.  Loss of taste and smell.  Smoking can affect your appearance by causing:  Wrinkles.  Yellow or stained teeth, fingers, and fingernails.  Smoking tobacco can also affect your social life.  Many workplaces, [employer], hotels, and public places are tobacco-free. This means that you may experience challenges in finding places to smoke when away from home.  The cost of a smoking habit can be expensive. Expenses for someone who smokes come in two ways: ? You spend money on a regular basis to buy tobacco. ? Your health care costs in the long-term are higher if you smoke.  Tobacco smoke can also affect the health of those around you. Children of smokers have greater chances of: ? Sudden infant death syndrome (SIDS). ? Ear  infections. ? Lung infections.  What lifestyle changes can be made?  Do not start smoking. Quit if you already do.  To quit smoking: ? Make a plan to quit smoking and commit yourself to it. Look for programs to help you and ask your health care provider for recommendations and ideas. ? Talk with your health care provider about using nicotine replacement medicines to help you quit. Medicine replacement medicines include gum, lozenges, patches, sprays, or pills. ? Do not replace cigarette smoking with electronic cigarettes, which are commonly called e-cigarettes. The safety of e-cigarettes is not known, and some may contain harmful chemicals. ? Avoid places, people, or situations that tempt you to smoke. ? If you try to quit but return to smoking, don't give up hope. It is very common for people to try a number of times before they fully succeed. When you feel ready again, give it another try.  Quitting smoking might affect the way you eat as well as your weight. Be prepared to monitor your eating habits. Get support in planning and following a healthy diet.  Ask your health care provider about having regular tests (screenings) to check for cancer. This may include blood tests, imaging tests, and other tests.  Exercise regularly. Consider taking walks, joining a gym, or doing yoga or exercise classes.  Develop skills to manage your stress. These skills include meditation. What are the benefits of quitting smoking? By quitting smoking, you may:  Lower your risk of getting cancer and other diseases caused by smoking.  Live longer.  Breathe better.  Lower your blood pressure and heart rate.  Stop your addiction to tobacco.  Stop creating secondhand smoke that hurts other people.  Improve your sense of taste and smell.  Look better over time, due to having fewer wrinkles and less staining.  What can happen if changes are not made? If you do not stop smoking, you may:  Get cancer  and other diseases.  Develop COPD or other long-term (chronic) lung conditions.  Develop serious problems with your heart and blood vessels (cardiovascular system).  Need more tests to screen for problems caused by smoking.  Have higher, long-term healthcare costs from medicines or treatments related to smoking.  Continue to have worsening changes in your lungs, mouth, and nose.  Where to find support: To get support to quit smoking, consider:  Asking your health care provider for more information and resources.  Taking classes to learn more about quitting smoking.  Looking for local organizations that offer resources about quitting smoking.  Joining a support group for people who want to quit smoking in your local community.  Where to find more information: You may find more information about quitting smoking from:  HelpGuide.org: www.helpguide.org/articles/addictions/how-to-quit-smoking.htm  BankR[employer]: smokefree.gov  American Lung Association: www.lung.org  Contact a health care provider if:  You have problems breathing.  Your lips, nose, or fingers turn blue.  You have chest pain.  You are coughing up blood.  You feel faint or you pass out.  You have other noticeable changes that cause you to worry. Summary  Smoking tobacco can negatively affect your health, the health of those around you, your finances, and your social life.  Do not start smoking. Quit if you already do. If you need help quitting, ask your health care provider.  Think about joining a support group for people who want to quit smoking in your local community. There are many effective programs that will help you to quit this behavior. This information is not intended to replace advice given to you by your health care provider. Make sure you discuss any questions you have with your health care provider. Document Released: 02/06/2016 Document Revised: 02/06/2016 Document Reviewed:  02/06/2016 Elsevier Interactive Patient Education  [employer].

## 2017-11-07 ENCOUNTER — Telehealth: Payer: Self-pay

## 2017-11-07 LAB — CBC WITH DIFFERENTIAL/PLATELET
Basophils Absolute: 0.1 10*3/uL (ref 0.0–0.2)
Basos: 1 %
EOS (ABSOLUTE): 0.5 10*3/uL — ABNORMAL HIGH (ref 0.0–0.4)
Eos: 9 %
Hematocrit: 40.6 % (ref 37.5–51.0)
Hemoglobin: 14.4 g/dL (ref 13.0–17.7)
Immature Grans (Abs): 0 10*3/uL (ref 0.0–0.1)
Immature Granulocytes: 1 %
Lymphocytes Absolute: 1.6 10*3/uL (ref 0.7–3.1)
Lymphs: 28 %
MCH: 35.3 pg — ABNORMAL HIGH (ref 26.6–33.0)
MCHC: 35.5 g/dL (ref 31.5–35.7)
MCV: 100 fL — ABNORMAL HIGH (ref 79–97)
Monocytes Absolute: 0.6 10*3/uL (ref 0.1–0.9)
Monocytes: 11 %
Neutrophils Absolute: 2.9 10*3/uL (ref 1.4–7.0)
Neutrophils: 50 %
Platelets: 244 10*3/uL (ref 150–450)
RBC: 4.08 x10E6/uL — ABNORMAL LOW (ref 4.14–5.80)
RDW: 12.4 % (ref 12.3–15.4)
WBC: 5.6 10*3/uL (ref 3.4–10.8)

## 2017-11-07 LAB — COMPREHENSIVE METABOLIC PANEL
ALT: 82 IU/L — ABNORMAL HIGH (ref 0–44)
AST: 87 IU/L — ABNORMAL HIGH (ref 0–40)
Albumin/Globulin Ratio: 1.1 — ABNORMAL LOW (ref 1.2–2.2)
Albumin: 3.9 g/dL (ref 3.5–5.5)
Alkaline Phosphatase: 92 IU/L (ref 39–117)
BUN/Creatinine Ratio: 10 (ref 9–20)
BUN: 7 mg/dL (ref 6–20)
Bilirubin Total: 0.5 mg/dL (ref 0.0–1.2)
CO2: 21 mmol/L (ref 20–29)
Calcium: 9 mg/dL (ref 8.7–10.2)
Chloride: 105 mmol/L (ref 96–106)
Creatinine, Ser: 0.72 mg/dL — ABNORMAL LOW (ref 0.76–1.27)
GFR calc Af Amer: 139 mL/min/{1.73_m2} (ref >59)
GFR calc non Af Amer: 120 mL/min/{1.73_m2} (ref >59)
Globulin, Total: 3.4 g/dL (ref 1.5–4.5)
Glucose: 79 mg/dL (ref 65–99)
Potassium: 4.1 mmol/L (ref 3.5–5.2)
Sodium: 144 mmol/L (ref 134–144)
Total Protein: 7.3 g/dL (ref 6.0–8.5)

## 2017-11-07 NOTE — Telephone Encounter (Signed)
Patient called to get his lab results. please follow up with patient.

## 2017-11-07 NOTE — Telephone Encounter (Signed)
Patient was called twice, went straight to vm both times, lvm to return call. If the patient returns that call please inform patient   Randie Heinz news!  Labs are significantly improving.  Remain off alcohol and products that contain tylenol and these should return completely to normal by his next follow-up!! Thanks, Georgian Co, PA-C

## 2017-11-07 NOTE — Telephone Encounter (Signed)
-----   Message from Anders Simmonds, [place] sent at 11/07/2017  9:34 AM EDT ----- Please call patient.  Great news!  Labs are significantly improving.  Remain off alcohol and products that contain tylenol and these should return completely to normal by his next follow-up!! Thanks, Georgian Co, PA-C

## 2017-12-12 ENCOUNTER — Ambulatory Visit: Payer: Self-pay | Admitting: Family Medicine

## 2018-06-23 IMAGING — US US ABDOMEN LIMITED
1 series · 14 of 25 positions shown · non-contrast
Comparison: None.

CLINICAL DATA: Abnormal LFT

EXAM:
ULTRASOUND ABDOMEN LIMITED RIGHT UPPER QUADRANT

[Series 1: us abdomen limited · 0.15mm/px · 14 of 59 slices shown]
[im 1/59]
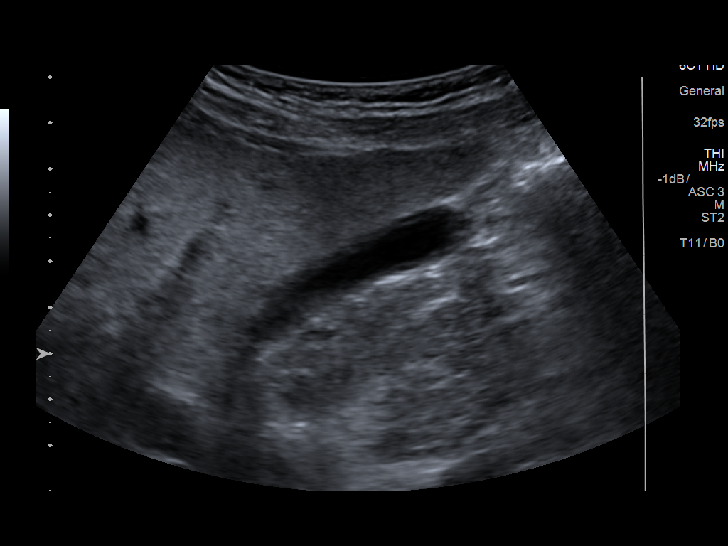
[im 5/59]
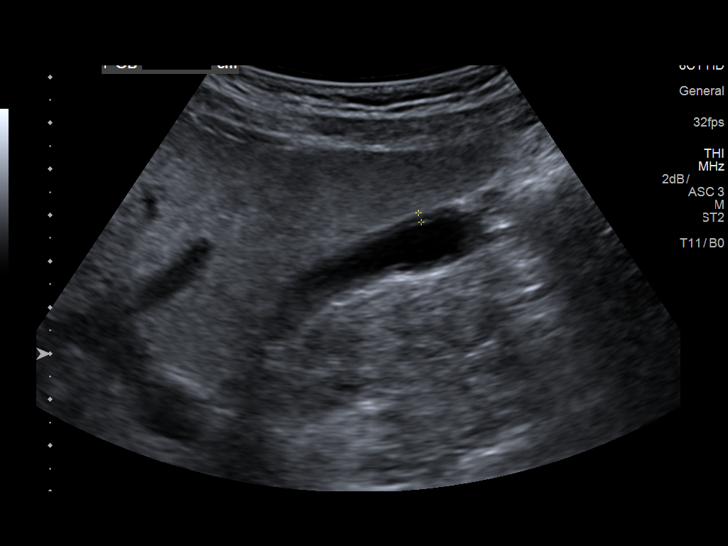
[im 10/59]
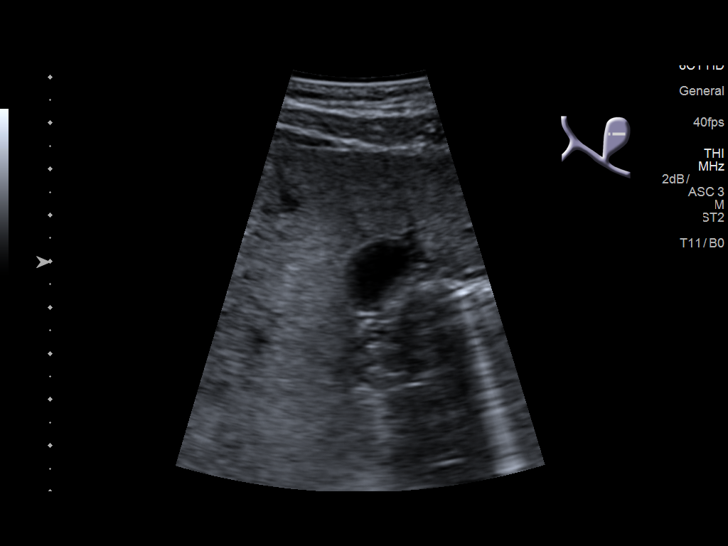
[im 15/59]
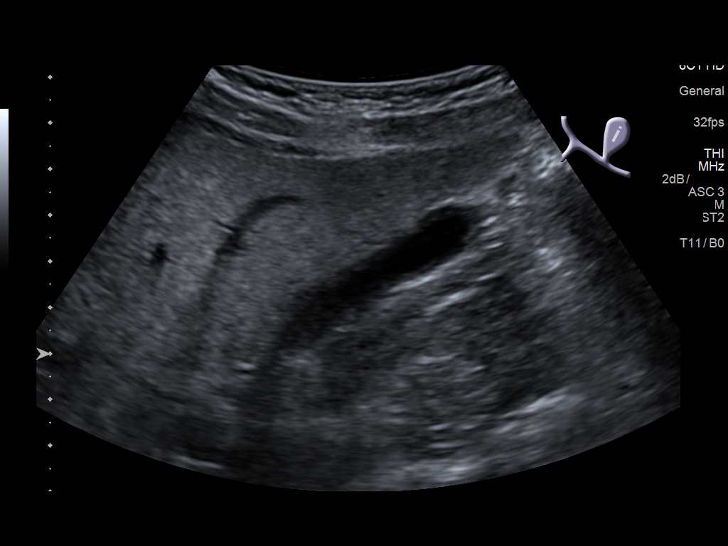
[im 20/59]
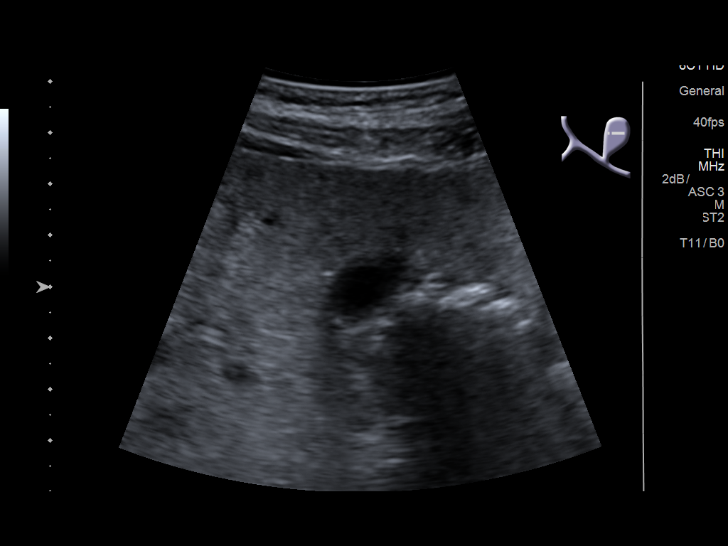
[im 22/59]
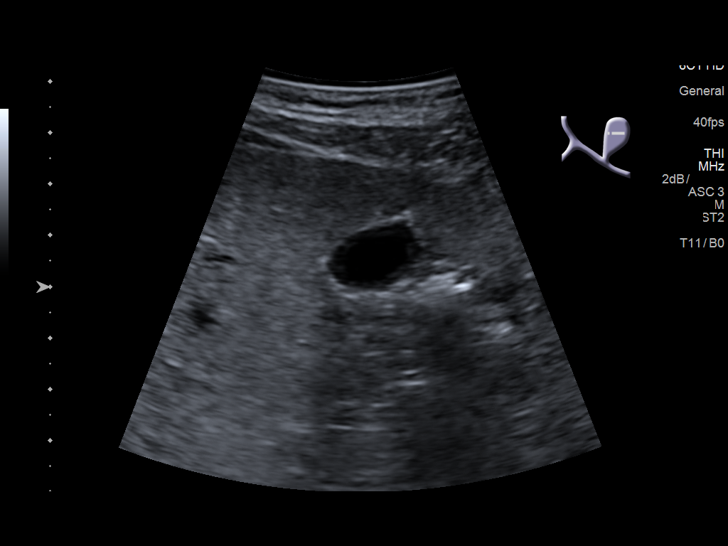
[im 27/59]
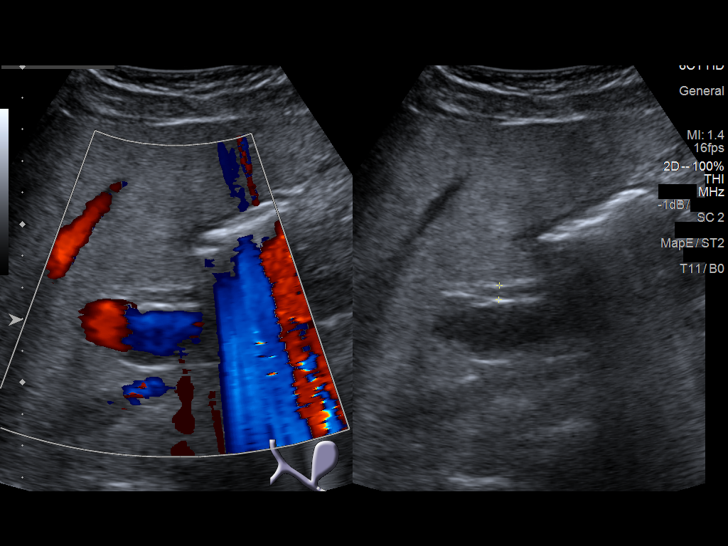
[im 32/59]
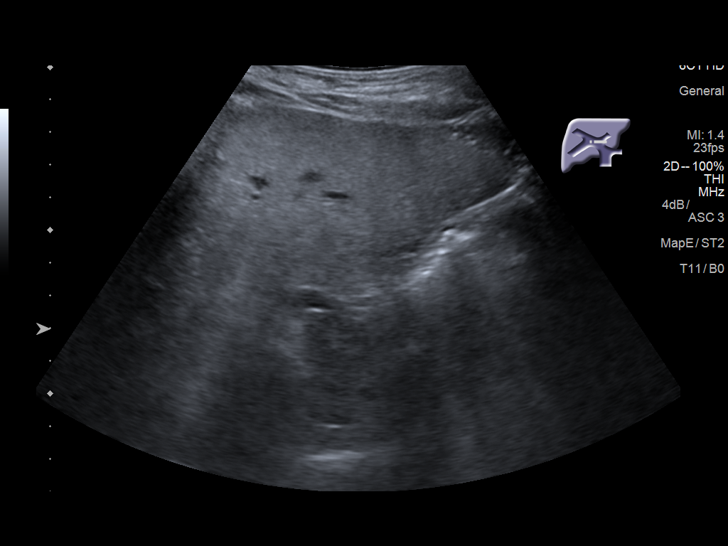
[im 37/59]
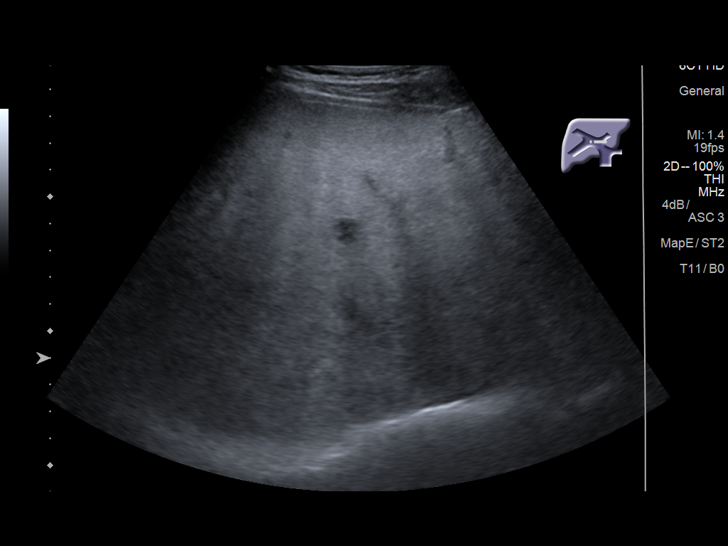
[im 39/59]
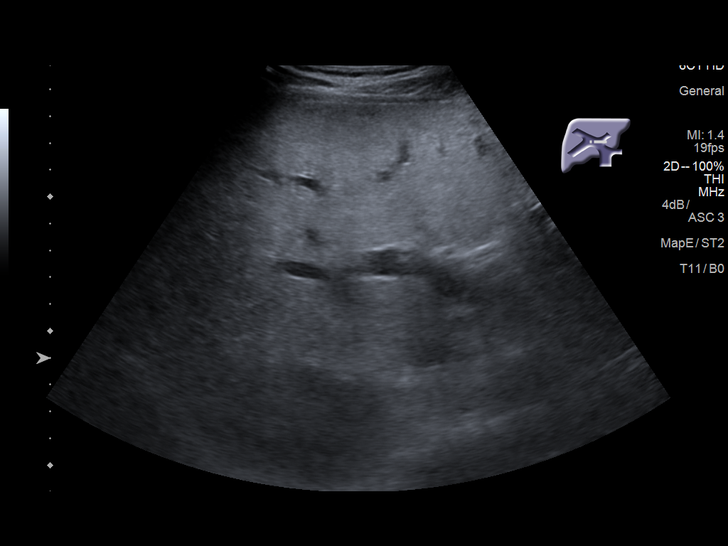
[im 44/59]
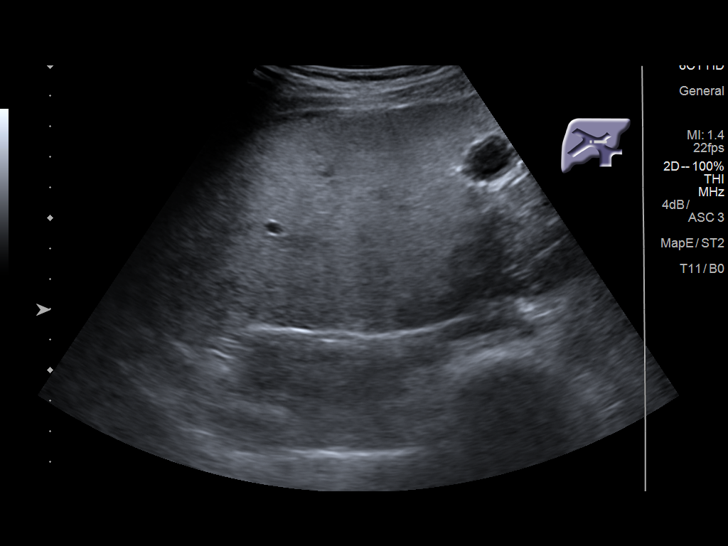
[im 49/59]
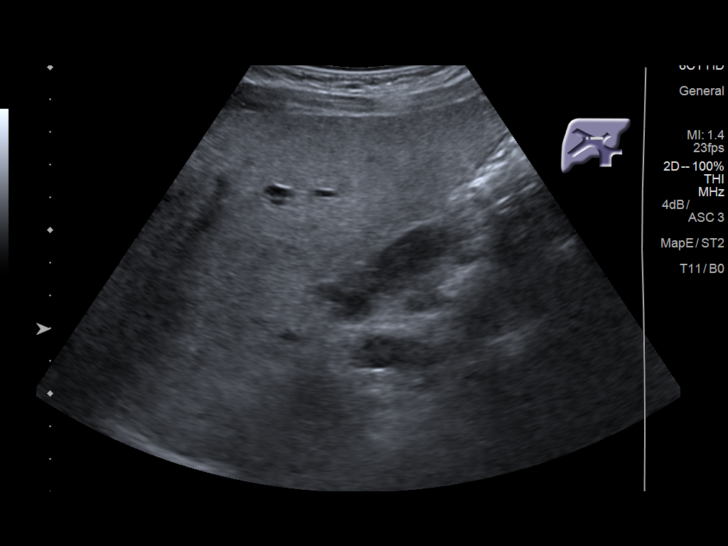
[im 54/59]
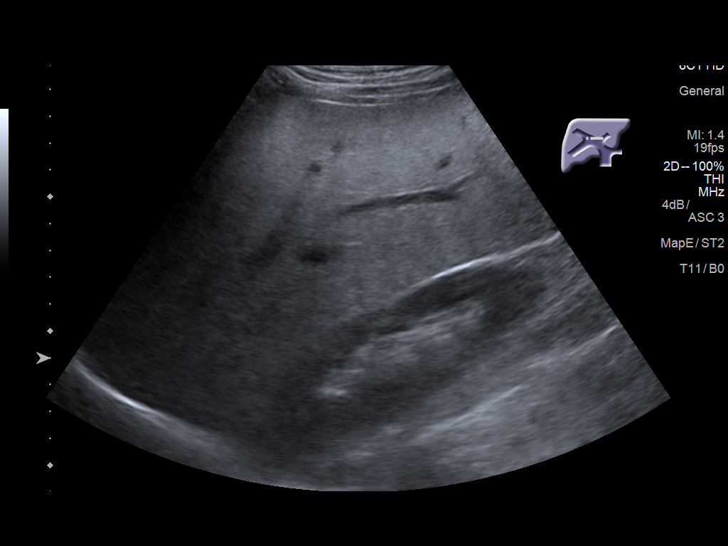
[im 59/59]
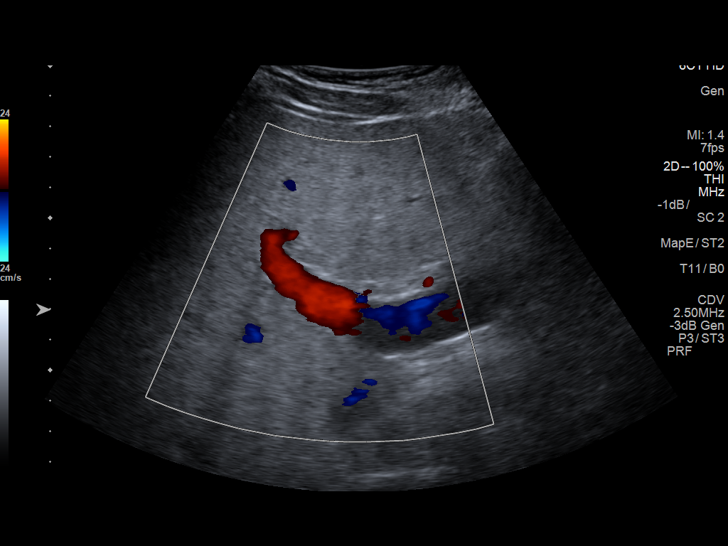

[14 of 25 positions shown; findings below may reference images not displayed]

FINDINGS: Gallbladder:

Contracted gallbladder. No shadowing stones. Normal wall thickness.
Negative sonographic FAUSTIN

Common bile duct:

Diameter: 4.5 mm

Liver:

Increased echogenicity. No focal hepatic abnormality. Portal vein is
patent on color Doppler imaging with normal direction of blood flow
towards the liver.
IMPRESSION: 1. Contracted gallbladder without shadowing stones or biliary
dilatation.
2. Increased hepatic echogenicity consistent with steatosis

## 2018-12-29 ENCOUNTER — Other Ambulatory Visit: Payer: Self-pay

## 2018-12-29 ENCOUNTER — Encounter: Payer: Self-pay | Admitting: *Deleted

## 2018-12-29 ENCOUNTER — Emergency Department
Admission: EM | Admit: 2018-12-29 | Discharge: 2018-12-29 | Disposition: A | Discharge status: Home / Self Care | Payer: Commercial Managed Care - PPO | Source: Home / Self Care

## 2018-12-29 DIAGNOSIS — I10 Essential (primary) hypertension: Secondary | ICD-10-CM | POA: Diagnosis not present

## 2018-12-29 MED ORDER — LISINOPRIL 10 MG PO TABS: 10.0000 mg | ORAL_TABLET | Freq: Every day | ORAL | 1 refills | Status: DC

## 2018-12-29 NOTE — Discharge Instructions (Signed)
Decrease your sodium intake.

## 2018-12-29 NOTE — ED Provider Notes (Signed)
[facility]    CSN: [id number] Arrival date & time: 12/29/18  1502      History   Chief Complaint Chief Complaint  Patient presents with  . Hypertension    HPI Zachary Mata is a 38 y.o. male.   The history is provided by the patient. No language interpreter was used.  Hypertension This is a new problem. The current episode started more than 1 week ago. The problem occurs constantly. Nothing aggravates the symptoms. Nothing relieves the symptoms. He has tried nothing for the symptoms. The treatment provided no relief.  Pt complains of elevated blood pressure.  Pt has been on medication in the past.  No current Md.  He is scheduled to see Dr. Karie Schwalbe next monht  Past Medical History:  Diagnosis Date  . Chronic back pain 03/02/2012  . Essential hypertension 03/02/2012  . Hypertension     Patient Active Problem List   Diagnosis Date Noted  . Leukopenia   . Sepsis (HCC) 09/27/2017  . Abnormal LFTs 09/27/2017  . Nausea & vomiting 09/27/2017  . Thrombocytopenia (HCC) 09/27/2017  . Elevated liver function tests   . Sepsis due to undetermined organism (HCC)   . Alcohol abuse   . Alcohol withdrawal delirium (HCC)   . Mental health problem   . Withdrawal syndrome (HCC) 07/01/2014  . Preventive measure 06/19/2012  . Essential hypertension 03/02/2012  . Chronic back pain 03/02/2012    Past Surgical History:  Procedure Laterality Date  . HAND SURGERY    . WRIST SURGERY         Home Medications    Prior to Admission medications   Medication Sig Start Date End Date Taking? Authorizing Provider  lisinopril (ZESTRIL) 10 MG tablet Take 1 tablet (10 mg total) by mouth daily. 12/29/18 12/29/19  Elson Areas, PA-C    Family History Family History  Problem Relation Age of Onset  . Cancer Mother        breats CA  . Diabetes Mother   . Breast cancer Other     Social History Social History   Tobacco Use  . Smoking status: Current Every Day Smoker   Packs/day: 2.00    Years: 15.00    Pack years: 30.00    Types: Cigarettes  . Smokeless tobacco: Never Used  Substance Use Topics  . Alcohol use: Yes    Alcohol/week: 6.0 - 7.0 standard drinks    Types: 6 - 7 Cans of beer per week  . Drug use: No     Allergies   Sulfa antibiotics   Review of Systems Review of Systems  All other systems reviewed and are negative.    Physical Exam Triage Vital Signs ED Triage Vitals [12/29/18 1520]  Enc Vitals Group     BP (!) 165/108     Pulse Rate (!) 106     Resp 18     Temp 98.4 F (36.9 C)     Temp Source Oral     SpO2 96 %     Weight 138 lb (62.6 kg)     Height 5\' 6"  (1.676 m)     Head Circumference      Peak Flow      Pain Score 0     Pain Loc      Pain Edu?      Excl. in GC?    No data found.  Updated Vital Signs BP (!) 165/108 (BP Location: Right Arm)   Pulse (!) 106  Temp 98.4 F (36.9 C) (Oral)   Resp 18   Ht 5\' 6"  (1.676 m)   Wt 62.6 kg   SpO2 96%   BMI 22.27 kg/m   Visual Acuity Right Eye Distance:   Left Eye Distance:   Bilateral Distance:    Right Eye Near:   Left Eye Near:    Bilateral Near:     Physical Exam Vitals signs and nursing note reviewed.  Constitutional:      Appearance: He is well-developed.  HENT:     Head: Normocephalic and atraumatic.     Right Ear: Tympanic membrane normal.     Left Ear: Tympanic membrane normal.     Nose: Nose normal.  Eyes:     Conjunctiva/sclera: Conjunctivae normal.  Neck:     Musculoskeletal: Neck supple.  Cardiovascular:     Rate and Rhythm: Normal rate and regular rhythm.     Heart sounds: No murmur.  Pulmonary:     Effort: Pulmonary effort is normal. No respiratory distress.     Breath sounds: Normal breath sounds.  Abdominal:     General: Abdomen is flat.     Palpations: Abdomen is soft.     Tenderness: There is no abdominal tenderness.  Musculoskeletal: Normal range of motion.  Skin:    General: Skin is warm and dry.  Neurological:      General: No focal deficit present.     Mental Status: He is alert.  Psychiatric:        Mood and Affect: Mood normal.      UC Treatments / Results  Labs (all labs ordered are listed, but only abnormal results are displayed) Labs Reviewed - No data to display  EKG   Radiology No results found.  Procedures Procedures (including critical care time)  Medications Ordered in UC Medications - No data to display  Initial Impression / Assessment and Plan / UC Course  I have reviewed the triage vital signs and the nursing notes.  Pertinent labs & imaging results that were available during my care of the patient were reviewed by me and considered in my medical decision making (see chart for details).     MDM  Pt advised to reduce sodaium.  Rx for lisinopril  Final Clinical Impressions(s) / UC Diagnoses   Final diagnoses:  Hypertension, unspecified type     Discharge Instructions     Decrease your sodium intake.     ED Prescriptions    Medication Sig Dispense Auth. Provider   lisinopril (ZESTRIL) 10 MG tablet Take 1 tablet (10 mg total) by mouth daily. 30 tablet Fransico Meadow, [place]     PDMP not reviewed this encounter.  An After Visit Summary was printed and given to the patient.    Fransico Meadow, [place] 12/29/18 1620

## 2018-12-29 NOTE — ED Triage Notes (Signed)
Pt c/o elevated BP and muscle cramps x 4-5 days. He was on antihypertensive "years ago", but none now.

## 2019-02-15 ENCOUNTER — Ambulatory Visit (INDEPENDENT_AMBULATORY_CARE_PROVIDER_SITE_OTHER): Payer: Commercial Managed Care - PPO | Admitting: Physician Assistant

## 2019-02-15 ENCOUNTER — Encounter: Payer: Self-pay | Admitting: Physician Assistant

## 2019-02-15 VITALS — Ht 66.0 in | Wt 138.0 lb

## 2019-02-15 DIAGNOSIS — Z20822 Contact with and (suspected) exposure to covid-19: Secondary | ICD-10-CM

## 2019-02-15 DIAGNOSIS — R438 Other disturbances of smell and taste: Secondary | ICD-10-CM | POA: Diagnosis not present

## 2019-02-15 DIAGNOSIS — R05 Cough: Secondary | ICD-10-CM

## 2019-02-15 DIAGNOSIS — R0981 Nasal congestion: Secondary | ICD-10-CM

## 2019-02-15 DIAGNOSIS — R059 Cough, unspecified: Secondary | ICD-10-CM

## 2019-02-15 NOTE — Progress Notes (Signed)
Started feeling bad last night: Nasal congestion Body aches Nausea/Vomiting x2  2 guys at work tested positive for Covid Chills Cough   No sore throat, change in smell (he thinks due to congestion), no change in taste

## 2019-02-15 NOTE — Progress Notes (Signed)
Patient ID: Zachary Mata, male   DOB: 06/19/1980, 39 y.o.   MRN: [id number] .Marland KitchenVirtual Visit via Video Note  I connected with Zachary Mata on 02/15/19 at  9:50 AM EST by a video enabled telemedicine application and verified that I am speaking with the correct person using two identifiers.  Location: Patient: home Provider: clinic   I discussed the limitations of evaluation and management by telemedicine and the availability of in person appointments. The patient expressed understanding and agreed to proceed.  History of Present Illness: Patient is a 39 year old male who calls into the clinic with symptoms of nausea, chills, body aches, nasal congestion, smell alteration that started this morning early.  He has not tried anything to make symptoms better.  He does work in a [employer] and 2 people have tested positive for Covid recently.  He has had direct exposure to them.  He denies any shortness of breath or difficulty breathing. He does have a dry cough. He has vomited twice.  He overall just does not feel well.   .. Active Ambulatory Problems    Diagnosis Date Noted  . Essential hypertension 03/02/2012  . Chronic back pain 03/02/2012  . Preventive measure 06/19/2012  . Withdrawal syndrome (HCC) 07/01/2014  . Alcohol abuse   . Alcohol withdrawal delirium (HCC)   . Mental health problem   . Sepsis (HCC) 09/27/2017  . Abnormal LFTs 09/27/2017  . Nausea & vomiting 09/27/2017  . Thrombocytopenia (HCC) 09/27/2017  . Elevated liver function tests   . Sepsis due to undetermined organism (HCC)   . Leukopenia    Resolved Ambulatory Problems    Diagnosis Date Noted  . No Resolved Ambulatory Problems   Past Medical History:  Diagnosis Date  . Hypertension    Reviewed med, allergies, problem list.     Observations/Objective: No acute distress Normal breathing.  Normal mood and appearance.   .. Today's Vitals   02/15/19 0932  Weight: 138 lb (62.6 kg)  Height: 5\' 6"   (1.676 m)   Body mass index is 22.27 kg/m.    Assessment and Plan: Marland KitchenSavir was seen today for nasal congestion and generalized body aches.  Diagnoses and all orders for this visit:  Close exposure to COVID-19 virus -     Novel Coronavirus, NAA ([facility]) -     Cancel: POCT Influenza A/B  Cough -     Novel Coronavirus, NAA ([facility]) -     Cancel: POCT Influenza A/B  Congestion of nasal sinus -     Novel Coronavirus, NAA ([facility]) -     Cancel: POCT Influenza A/B  Smell, impaired -     Novel Coronavirus, NAA ([facility]) -     Cancel: POCT Influenza A/B   Patient was written out of work until he can be Covid and flu tested.  He will come to our office for a drive-through test.  Discussed symptomatic care of viral infections.  If there is any sudden worsening shortness of breath or breathing difficulty call office or go to emergency room/urgent care.  He should self isolate until test results are back.  Will advise when he can return to work based on symptoms at the time his Covid test has returned.   Follow Up Instructions:    I discussed the assessment and treatment plan with the patient. The patient was provided an opportunity to ask questions and all were answered. The patient agreed with the plan and demonstrated an understanding of the instructions.  The patient was advised to call back or seek an in-person evaluation if the symptoms worsen or if the condition fails to improve as anticipated.    Iran Planas, PA-C

## 2019-02-16 LAB — NOVEL CORONAVIRUS, NAA: SARS-CoV-2, NAA: NOT DETECTED

## 2019-02-16 NOTE — Progress Notes (Signed)
Negative for covid. How are you feeling?

## 2019-02-26 ENCOUNTER — Ambulatory Visit (INDEPENDENT_AMBULATORY_CARE_PROVIDER_SITE_OTHER): Payer: Commercial Managed Care - PPO

## 2019-02-26 ENCOUNTER — Other Ambulatory Visit: Payer: Self-pay

## 2019-02-26 ENCOUNTER — Ambulatory Visit (INDEPENDENT_AMBULATORY_CARE_PROVIDER_SITE_OTHER): Payer: Commercial Managed Care - PPO | Admitting: Sports Medicine

## 2019-02-26 VITALS — BP 127/80 | HR 104 | Ht 66.0 in | Wt 139.0 lb

## 2019-02-26 DIAGNOSIS — R05 Cough: Secondary | ICD-10-CM

## 2019-02-26 DIAGNOSIS — J449 Chronic obstructive pulmonary disease, unspecified: Secondary | ICD-10-CM | POA: Insufficient documentation

## 2019-02-26 DIAGNOSIS — G4719 Other hypersomnia: Secondary | ICD-10-CM | POA: Insufficient documentation

## 2019-02-26 DIAGNOSIS — F32A Depression, unspecified: Secondary | ICD-10-CM | POA: Insufficient documentation

## 2019-02-26 DIAGNOSIS — F1010 Alcohol abuse, uncomplicated: Secondary | ICD-10-CM

## 2019-02-26 DIAGNOSIS — D649 Anemia, unspecified: Secondary | ICD-10-CM

## 2019-02-26 DIAGNOSIS — N529 Male erectile dysfunction, unspecified: Secondary | ICD-10-CM

## 2019-02-26 DIAGNOSIS — F17200 Nicotine dependence, unspecified, uncomplicated: Secondary | ICD-10-CM

## 2019-02-26 DIAGNOSIS — F411 Generalized anxiety disorder: Secondary | ICD-10-CM | POA: Diagnosis not present

## 2019-02-26 DIAGNOSIS — F419 Anxiety disorder, unspecified: Secondary | ICD-10-CM | POA: Insufficient documentation

## 2019-02-26 DIAGNOSIS — Z0000 Encounter for general adult medical examination without abnormal findings: Secondary | ICD-10-CM | POA: Diagnosis not present

## 2019-02-26 DIAGNOSIS — H6123 Impacted cerumen, bilateral: Secondary | ICD-10-CM | POA: Diagnosis not present

## 2019-02-26 DIAGNOSIS — M62838 Other muscle spasm: Secondary | ICD-10-CM | POA: Insufficient documentation

## 2019-02-26 DIAGNOSIS — I10 Essential (primary) hypertension: Secondary | ICD-10-CM

## 2019-02-26 DIAGNOSIS — R7401 Elevation of levels of liver transaminase levels: Secondary | ICD-10-CM

## 2019-02-26 DIAGNOSIS — R053 Chronic cough: Secondary | ICD-10-CM

## 2019-02-26 IMAGING — DX DG CHEST 2V
2 series · 2 of 2 positions shown · non-contrast
Comparison: [DATE], [DATE]

CLINICAL DATA: Chronic cough

EXAM:
CHEST - 2 VIEW

[chest pa]
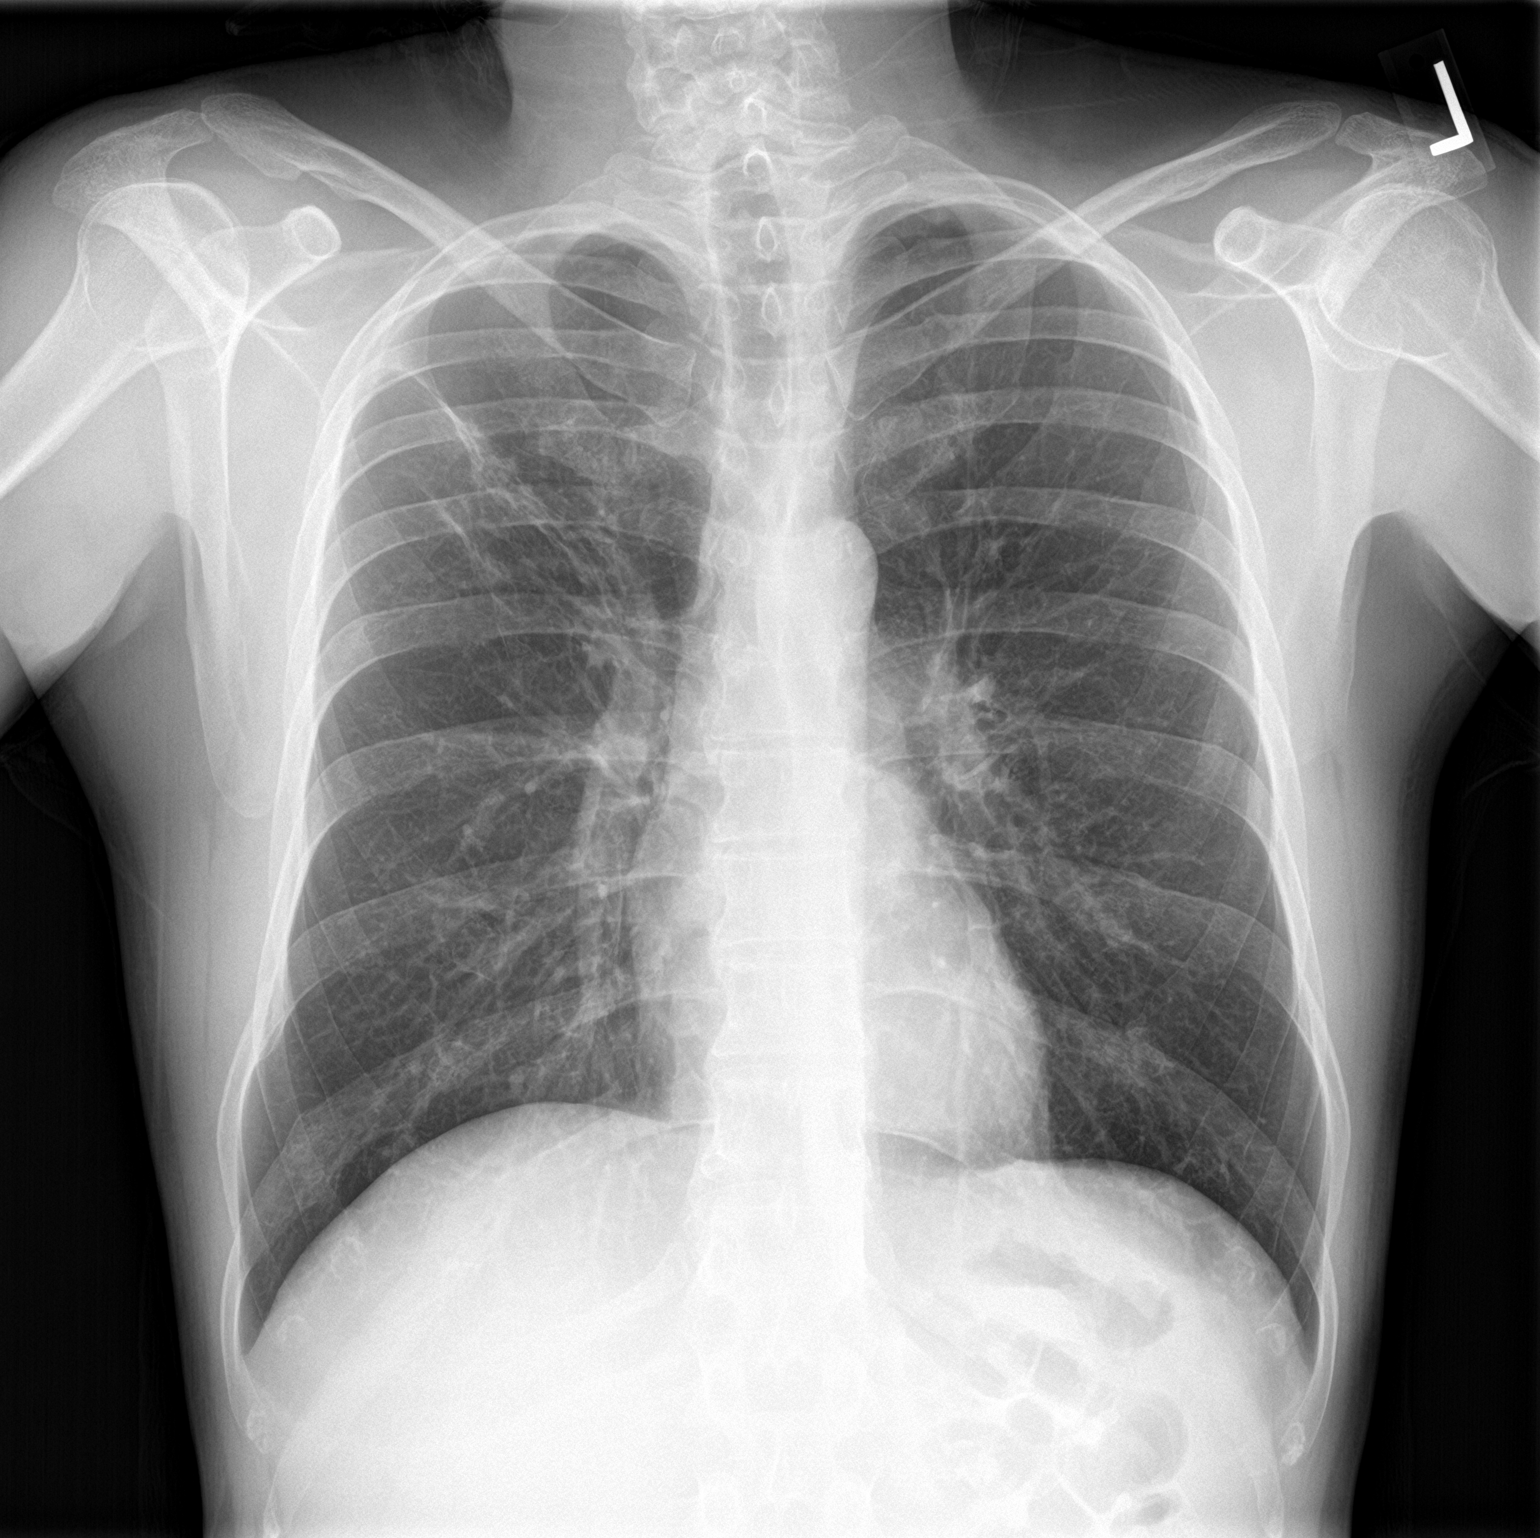

[chest lat]
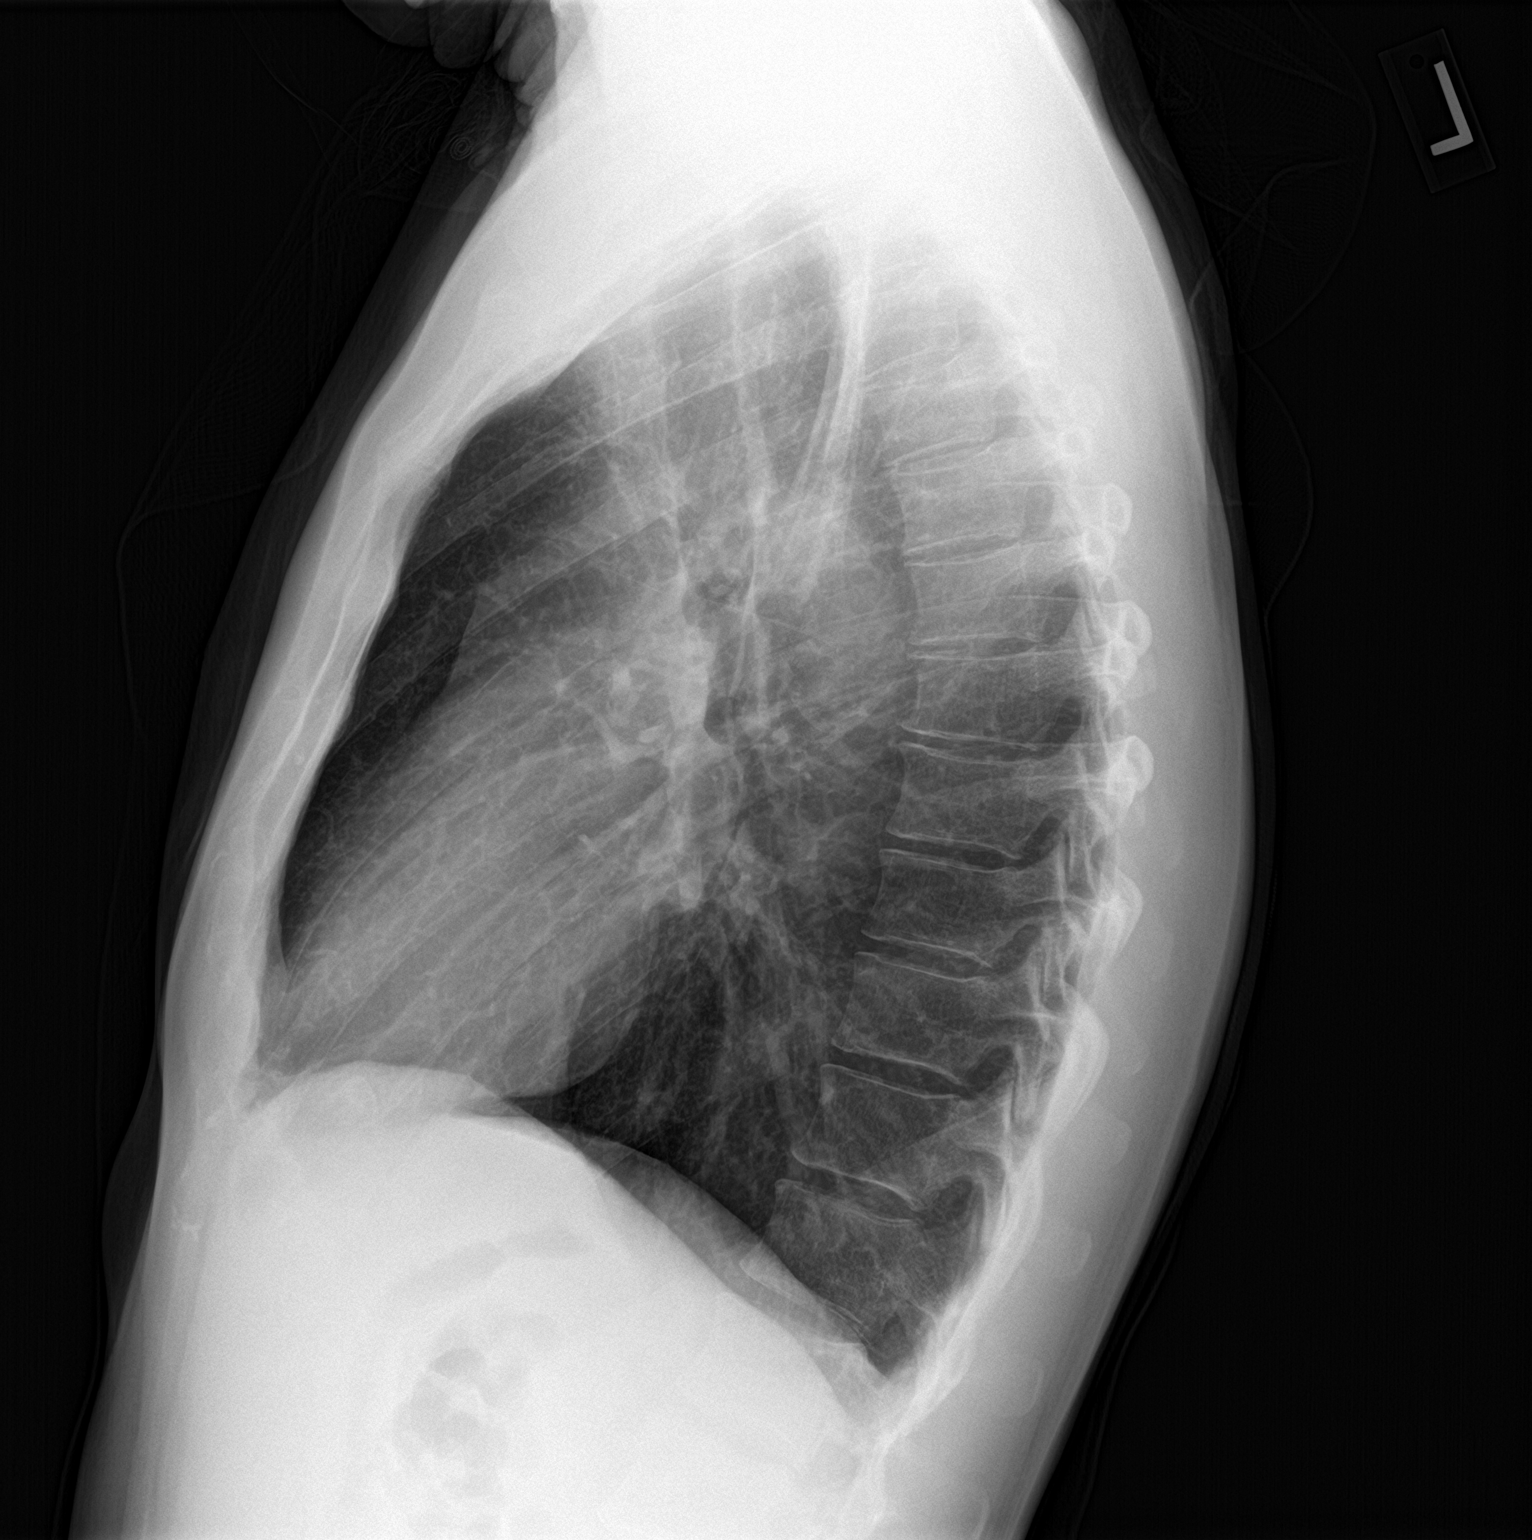

[2 of 2 positions shown; findings below may reference images not displayed]

FINDINGS: Linear bandlike opacity in the right upper lobe without significant
change since [DATE], new from more remote exams. Appearance most
closely resembles scarring. No acute consolidation, pleural effusion
or pneumothorax. Normal heart size. No pneumothorax.
IMPRESSION: Linear bandlike opacity in the right upper lobe most closely
resembles scarring. No acute pulmonary infiltrates. Chest CT
follow-up as indicated given history of chronic cough and smoking
history.

## 2019-02-26 MED ORDER — TADALAFIL 5 MG PO TABS: 5.0000 mg | ORAL_TABLET | Freq: Every day | ORAL | 3 refills | Status: DC | PRN

## 2019-02-26 MED ORDER — LISINOPRIL 10 MG PO TABS: 10.0000 mg | ORAL_TABLET | Freq: Every day | ORAL | 3 refills | Status: DC

## 2019-02-26 MED ORDER — BUSPIRONE HCL 5 MG PO TABS: 5.0000 mg | ORAL_TABLET | Freq: Three times a day (TID) | ORAL | 3 refills | Status: DC

## 2019-02-26 NOTE — Assessment & Plan Note (Signed)
Much better with lisinopril 10, continue this.

## 2019-02-26 NOTE — Assessment & Plan Note (Signed)
With significant snoring. Adding a sleep study.

## 2019-02-26 NOTE — Assessment & Plan Note (Signed)
[name] reports difficulty achieving her erections, as well as difficulty maintaining quality erections, this in turn has decreased his sex drive due to fear of poor performance. Likely multifactorial due to anxiety, heavy alcohol consumption, smoking. Checking testosterone and also adding a prescription for Cialis with a good Rx discount coupon. We did discuss the testosterone has no relation to erectile function, but merely is related to sex drive. We can revisit this in a month.

## 2019-02-26 NOTE — Assessment & Plan Note (Addendum)
[name] has a lot of possible causes for chronic nonproductive cough that he has had for months. He used to work in Insurance account manager, with lots of silica inhalation. He is a heavy smoker. We are going to proceed with chest x-ray today, and I had like him to return for pre and postbronchodilator spirometry.  X-ray did show a right upper lobe mass, proceeding with chest CT with contrast.

## 2019-02-26 NOTE — Assessment & Plan Note (Signed)
Adding routine labs. I did a routine physical today. We also addressed multiple problems.

## 2019-02-26 NOTE — Assessment & Plan Note (Signed)
We did also noted bilateral cerumen impactions as the likely cause of his hearing loss. Bilateral irrigation today.

## 2019-02-26 NOTE — Assessment & Plan Note (Signed)
Increase generalized anxiety, poor sleep. No suicidal or homicidal ideation, principal symptom is agoraphobia. Adding BuSpar 5 mg 3 times daily. Return to see me in 1 month for PHQ and GAD. Avoiding SSRIs due to concurrent erectile dysfunction.

## 2019-02-26 NOTE — Assessment & Plan Note (Signed)
Bilateral muscle spasms, upper and lower extremities, nonspecific. Adding CK levels.

## 2019-02-26 NOTE — Assessment & Plan Note (Signed)
[name] is a heavy smoker, we are going to discuss cutting back very soon. He has a chronic cough, chronic sinusitis, all of this is attributable to his smoking.

## 2019-02-26 NOTE — Progress Notes (Addendum)
Subjective:    CC: Annual Physical Exam  HPI:  Mali is here for his physical as well as multiple problems as documented below.  I reviewed the past medical history, family history, social history, surgical history, and allergies today and no changes were needed.  Please see the problem list section below in epic for further details.  Past Medical History: Past Medical History:  Diagnosis Date  . Chronic back pain 03/02/2012  . Essential hypertension 03/02/2012  . Hypertension    Past Surgical History: Past Surgical History:  Procedure Laterality Date  . HAND SURGERY    . WRIST SURGERY     Social History: Social History   Socioeconomic History  . Marital status: Married    Spouse name: Not on file  . Number of children: Not on file  . Years of education: Not on file  . Highest education level: Not on file  Occupational History  . Not on file  Tobacco Use  . Smoking status: Current Every Day Smoker    Packs/day: 2.00    Years: 15.00    Pack years: 30.00    Types: Cigarettes  . Smokeless tobacco: Never Used  Substance and Sexual Activity  . Alcohol use: Yes    Alcohol/week: 6.0 - 7.0 standard drinks    Types: 6 - 7 Cans of beer per week  . Drug use: No  . Sexual activity: Yes  Other Topics Concern  . Not on file  Social History Narrative  . Not on file   Social Determinants of Health   Financial Resource Strain:   . Difficulty of Paying Living Expenses: Not on file  Food Insecurity:   . Worried About Charity fundraiser in the Last Year: Not on file  . Ran Out of Food in the Last Year: Not on file  Transportation Needs:   . Lack of Transportation (Medical): Not on file  . Lack of Transportation (Non-Medical): Not on file  Physical Activity:   . Days of Exercise per Week: Not on file  . Minutes of Exercise per Session: Not on file  Stress:   . Feeling of Stress : Not on file  Social Connections:   . Frequency of Communication with Friends and Family: Not  on file  . Frequency of Social Gatherings with Friends and Family: Not on file  . Attends Religious Services: Not on file  . Active Member of Clubs or Organizations: Not on file  . Attends Archivist Meetings: Not on file  . Marital Status: Not on file   Family History: Family History  Problem Relation Age of Onset  . Cancer Mother        breats CA  . Diabetes Mother   . Breast cancer Other    Allergies: Allergies  Allergen Reactions  . Sulfa Antibiotics Swelling   Medications: See med rec.  Review of Systems: No headache, visual changes, nausea, vomiting, diarrhea, constipation, dizziness, abdominal pain, skin rash, fevers, chills, night sweats, swollen lymph nodes, weight loss, chest pain, body aches, joint swelling, muscle aches, shortness of breath, mood changes, visual or auditory hallucinations.  Objective:    General: Well Developed, well nourished, and in no acute distress.  Neuro: Alert and oriented x3, extra-ocular muscles intact, sensation grossly intact. Cranial nerves II through XII are intact, motor, sensory, and coordinative functions are all intact. HEENT: Normocephalic, atraumatic, pupils equal round reactive to light, neck supple, no masses, no lymphadenopathy, thyroid nonpalpable. Oropharynx, nasopharynx unremarkable, bilateral cerumen impactions.  Skin: Warm and dry, no rashes noted.  Cardiac: Regular rate and rhythm, no murmurs rubs or gallops.  Respiratory: Clear to auscultation bilaterally. Not using accessory muscles, speaking in full sentences.  Abdominal: Soft, nontender, nondistended, positive bowel sounds, no masses, no organomegaly.  Musculoskeletal: Shoulder, elbow, wrist, hip, knee, ankle stable, and with full range of motion.  Indication: Cerumen impaction of the left and right ear(s) Medical necessity statement: On physical examination, cerumen impairs clinically significant portions of the external auditory canal, and tympanic membrane.  Noted obstructive, copious cerumen that cannot be removed without magnification and instrumentations requiring physician skills Consent: Discussed benefits and risks of procedure and verbal consent obtained Procedure: Patient was prepped for the procedure. Utilized an otoscope to assess and take note of the ear canal, the tympanic membrane, and the presence, amount, and placement of the cerumen. Gentle water irrigation was utilized to remove cerumen.  Post procedure examination: shows cerumen was completely removed. Patient tolerated procedure well. The patient is made aware that they may experience temporary vertigo, temporary hearing loss, and temporary discomfort. If these symptom last for more than 24 hours to call the clinic or proceed to the ED.  Impression and Recommendations:    The patient was counselled, risk factors were discussed, anticipatory guidance given.  Smoker [name] is a heavy smoker, we are going to discuss cutting back very soon. He has a chronic cough, chronic sinusitis, all of this is attributable to his smoking.  Alcohol abuse Currently still heavily consuming alcohol, 10 beers per day. He understands he needs to cut back but he is not yet ready.  Annual physical exam Adding routine labs. I did a routine physical today. We also addressed multiple problems.  Bilateral hearing loss due to cerumen impaction We did also noted bilateral cerumen impactions as the likely cause of his hearing loss. Bilateral irrigation today.   Chronic cough [name] has a lot of possible causes for chronic nonproductive cough that he has had for months. He used to work in [employer] account manager, with lots of silica inhalation. He is a heavy smoker. We are going to proceed with chest x-ray today, and I had like him to return for pre and postbronchodilator spirometry.  X-ray did show a right upper lobe mass, proceeding with chest CT with contrast.  Erectile dysfunction [name] reports  difficulty achieving her erections, as well as difficulty maintaining quality erections, this in turn has decreased his sex drive due to fear of poor performance. Likely multifactorial due to anxiety, heavy alcohol consumption, smoking. Checking testosterone and also adding a prescription for Cialis with a good Rx discount coupon. We did discuss the testosterone has no relation to erectile function, but merely is related to sex drive. We can revisit this in a month.  Essential hypertension Much better with lisinopril 10, continue this.  Generalized anxiety disorder Increase generalized anxiety, poor sleep. No suicidal or homicidal ideation, principal symptom is agoraphobia. Adding BuSpar 5 mg 3 times daily. Return to see me in 1 month for PHQ and GAD. Avoiding SSRIs due to concurrent erectile dysfunction.  Muscle spasm Bilateral muscle spasms, upper and lower extremities, nonspecific. Adding CK levels.  Excessive daytime sleepiness With significant snoring. Adding a sleep study.  Transaminitis Heavy drinker with abnormal liver function tests, anemia, thrombocytopenia. This is all consistent with portal hypertension and possible cirrhosis. Adding a liver ultrasound.  Normocytic anemia Also has normocytic anemia, likely anemia of chronic disease, we do need to pull the trigger for a full anemia  panel.   ___________________________________________ Ihor Austin. Benjamin Stain, M.D., ABFM., CAQSM. Primary Care and Sports Medicine [facility]  Adjunct Professor of Family Medicine  [employer]

## 2019-02-26 NOTE — Assessment & Plan Note (Signed)
Currently still heavily consuming alcohol, 10 beers per day. He understands he needs to cut back but he is not yet ready.

## 2019-03-01 NOTE — Addendum Note (Signed)
Addended by: Monica Becton on: 03/01/2019 02:15 PM   Modules accepted: Orders

## 2019-03-03 DIAGNOSIS — R7401 Elevation of levels of liver transaminase levels: Secondary | ICD-10-CM | POA: Insufficient documentation

## 2019-03-03 DIAGNOSIS — D649 Anemia, unspecified: Secondary | ICD-10-CM | POA: Insufficient documentation

## 2019-03-03 MED ORDER — VITAMIN D (ERGOCALCIFEROL) 1.25 MG (50000 UNIT) PO CAPS: 50000.0000 [IU] | ORAL_CAPSULE | ORAL | 0 refills | Status: DC

## 2019-03-03 NOTE — Assessment & Plan Note (Signed)
Also has normocytic anemia, likely anemia of chronic disease, we do need to pull the trigger for a full anemia panel.

## 2019-03-03 NOTE — Assessment & Plan Note (Signed)
Heavy drinker with abnormal liver function tests, anemia, thrombocytopenia. This is all consistent with portal hypertension and possible cirrhosis. Adding a liver ultrasound.

## 2019-03-03 NOTE — Addendum Note (Signed)
Addended by: Monica Becton on: 03/03/2019 08:25 AM   Modules accepted: Orders

## 2019-03-03 NOTE — Addendum Note (Signed)
Addended by: Monica Becton on: 03/03/2019 08:19 AM   Modules accepted: Orders

## 2019-03-05 ENCOUNTER — Ambulatory Visit (INDEPENDENT_AMBULATORY_CARE_PROVIDER_SITE_OTHER): Payer: Commercial Managed Care - PPO

## 2019-03-05 ENCOUNTER — Other Ambulatory Visit: Payer: Self-pay

## 2019-03-05 ENCOUNTER — Telehealth: Payer: Self-pay | Admitting: Sports Medicine

## 2019-03-05 DIAGNOSIS — R7401 Elevation of levels of liver transaminase levels: Secondary | ICD-10-CM | POA: Diagnosis not present

## 2019-03-05 DIAGNOSIS — R05 Cough: Secondary | ICD-10-CM | POA: Diagnosis not present

## 2019-03-05 IMAGING — CT CT CHEST W/ CM
2 of 4 series · 15 of 36 positions shown, 18 images · IV contrast (omnipaque)
Comparison: None.

CLINICAL DATA: Soft tissue mass in the chest.

EXAM:
CT CHEST WITH CONTRAST
TECHNIQUE: Multidetector CT imaging of the chest was performed during
intravenous contrast administration.
CONTRAST:  75mL OMNIPAQUE IOHEXOL 300 MG/ML  SOLN

[Series 3: lungs · axial · 0.66mm/px · z∈[-307,-7]mm · 12 of 168 slices shown, 15 images]
[im 9/168  mediastinal]
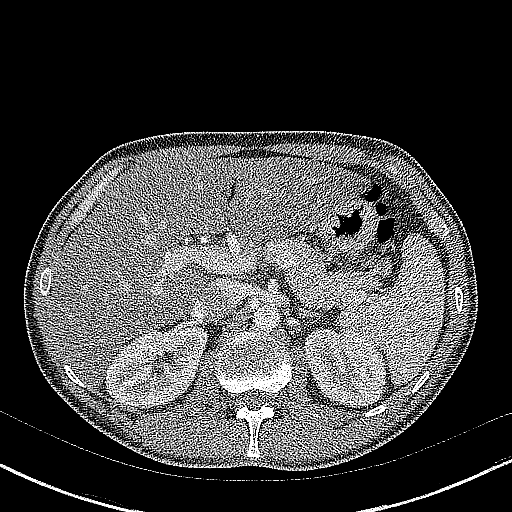
[im 9/168  lung]
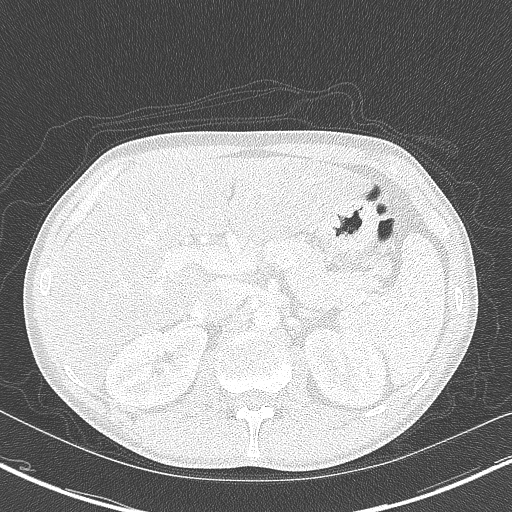
[im 27/168  lung]
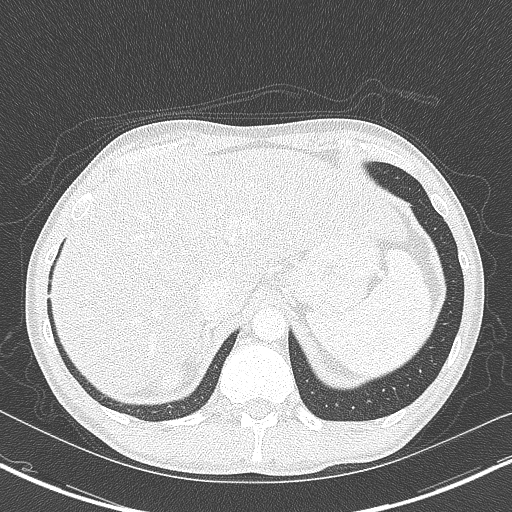
[im 36/168  lung]
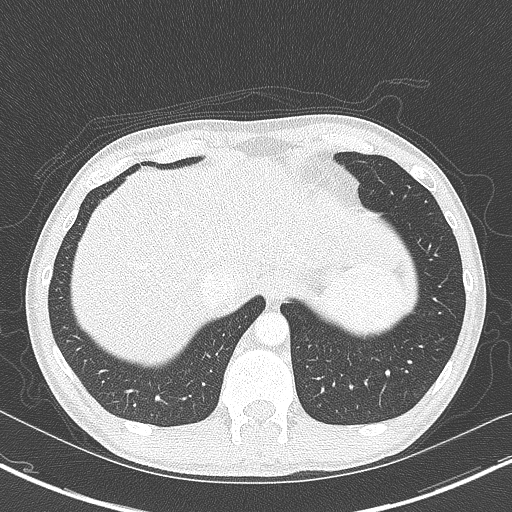
[im 53/168  lung]
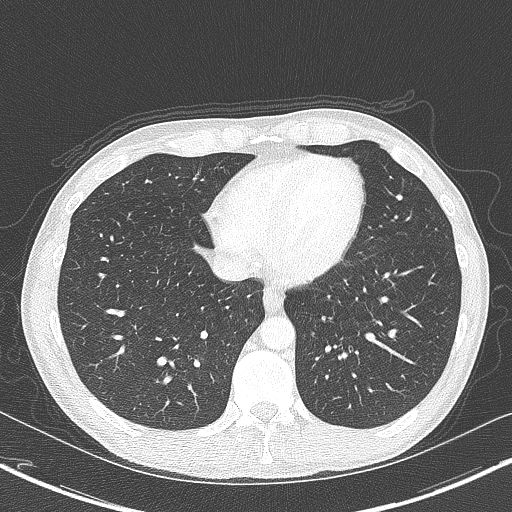
[im 62/168  mediastinal]
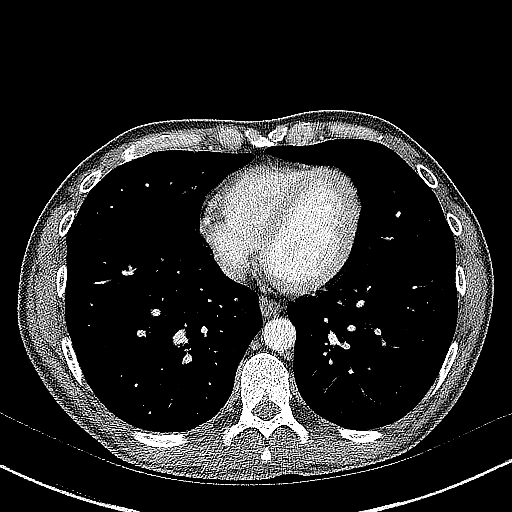
[im 62/168  lung]
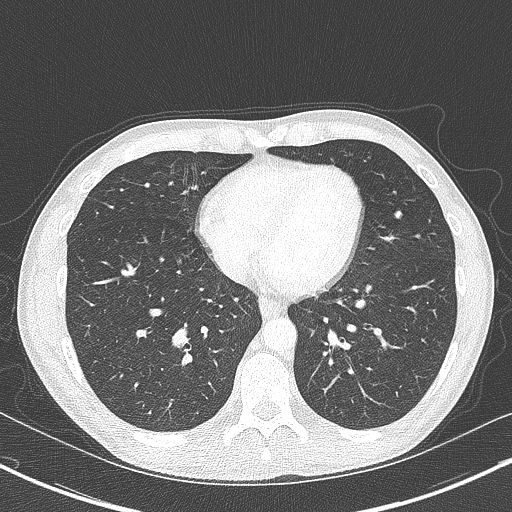
[im 80/168  lung]
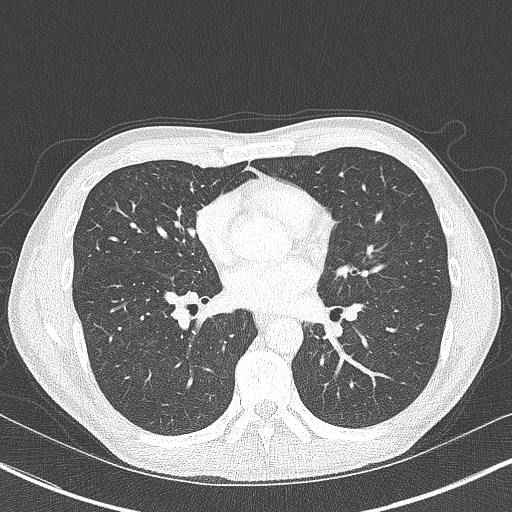
[im 88/168  lung]
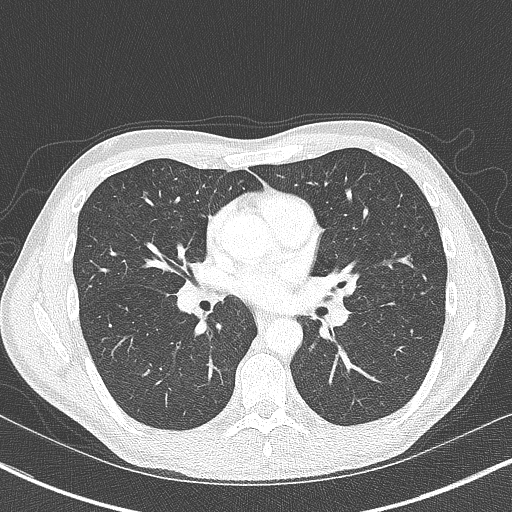
[im 106/168  lung]
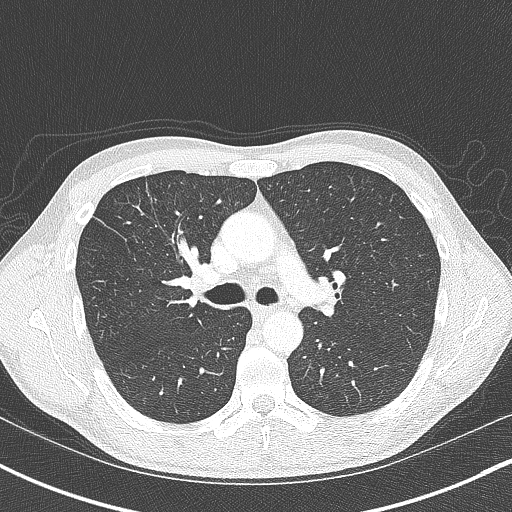
[im 115/168  mediastinal]
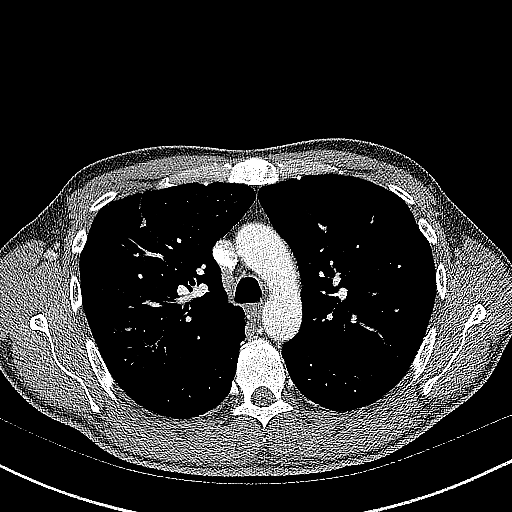
[im 115/168  lung]
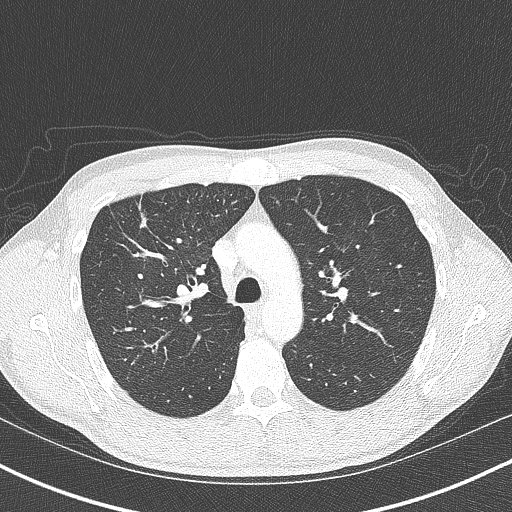
[im 132/168  lung]
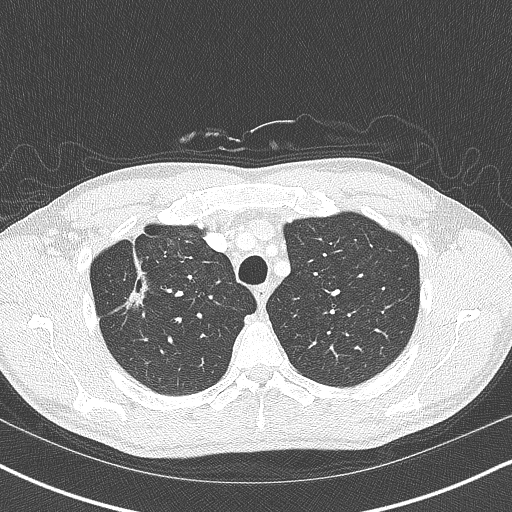
[im 141/168  lung]
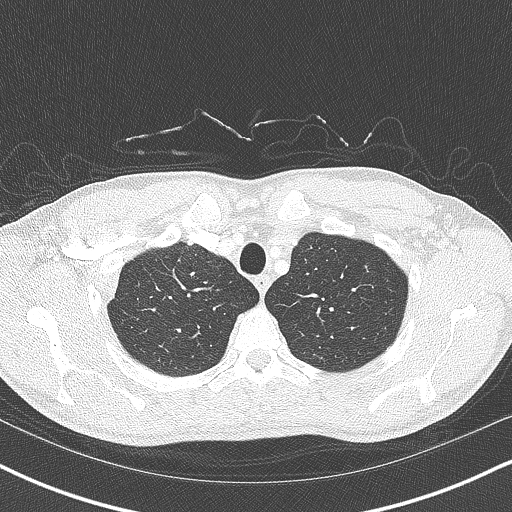
[im 159/168  lung]
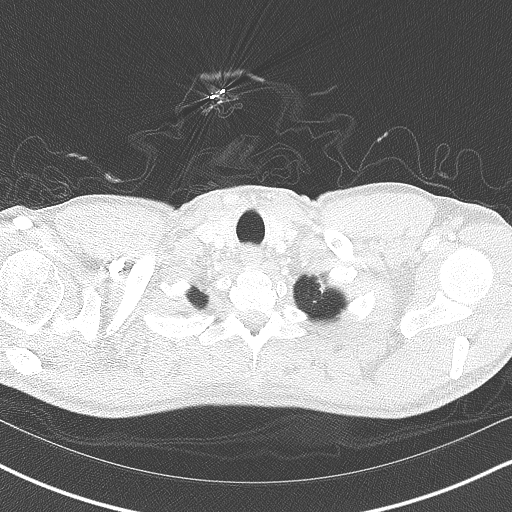

[Series 5: coronal · coronal · 0.67mm/px · 3 of 121 slices shown]
[im 25/121  lung]
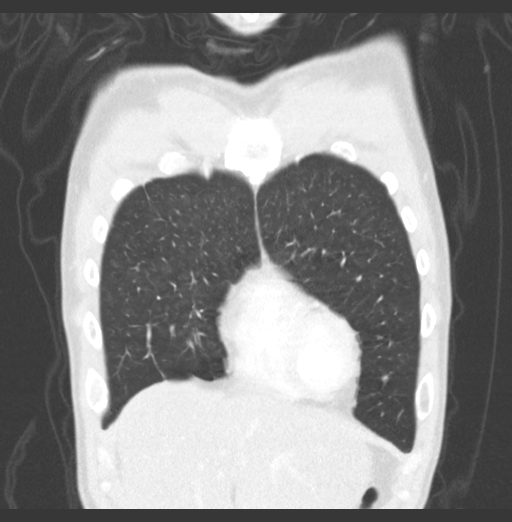
[im 49/121  lung]
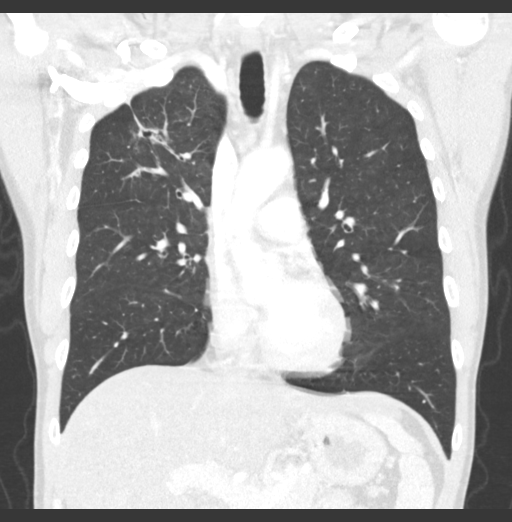
[im 73/121  lung]
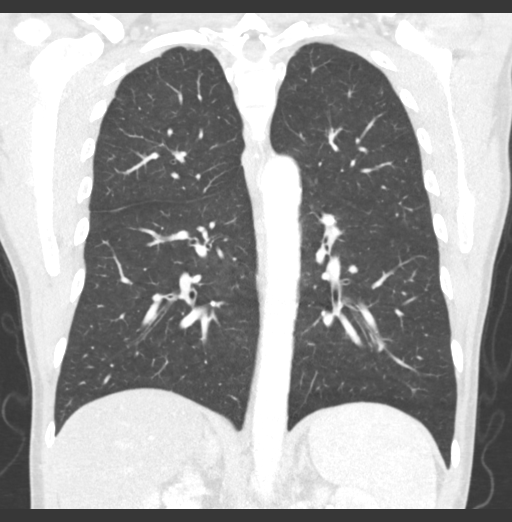

[15 of 36 positions shown; findings below may reference images not displayed]

FINDINGS: Cardiovascular: No significant vascular findings. Normal heart size.
No pericardial effusion.

Mediastinum/Nodes: No enlarged mediastinal, hilar, or axillary lymph
nodes. Thyroid gland, trachea, and esophagus demonstrate no
significant findings.

Lungs/Pleura: Mild-to-moderate severity linear scarring and/or
atelectasis is seen within the anterolateral aspect of the right
upper lobe.

There is no evidence of acute infiltrate, pleural effusion or
pneumothorax.

Upper Abdomen: There is diffuse fatty infiltration of the liver
parenchyma.

Musculoskeletal: No chest wall abnormality. No acute or significant
osseous findings.
IMPRESSION: 1. Mild-to-moderate severity linear scarring and/or atelectasis
within the anterolateral aspect of the right upper lobe.
2. Diffuse fatty infiltration of the liver.

## 2019-03-05 IMAGING — US US ABDOMEN COMPLETE
1 series · 14 of 25 positions shown · non-contrast
Comparison: None

CLINICAL DATA: Abnormal LFTs, transaminitis, thrombocytopenia,
heavy drinker, question cirrhosis

EXAM:
ABDOMEN ULTRASOUND COMPLETE

[Series 1: us abdomen complete · 0.10mm/px · 14 of 115 slices shown]
[im 1/115]
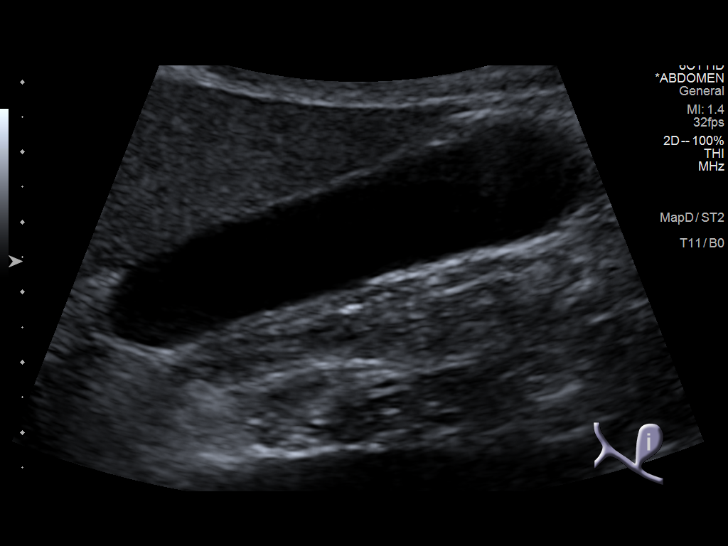
[im 10/115]
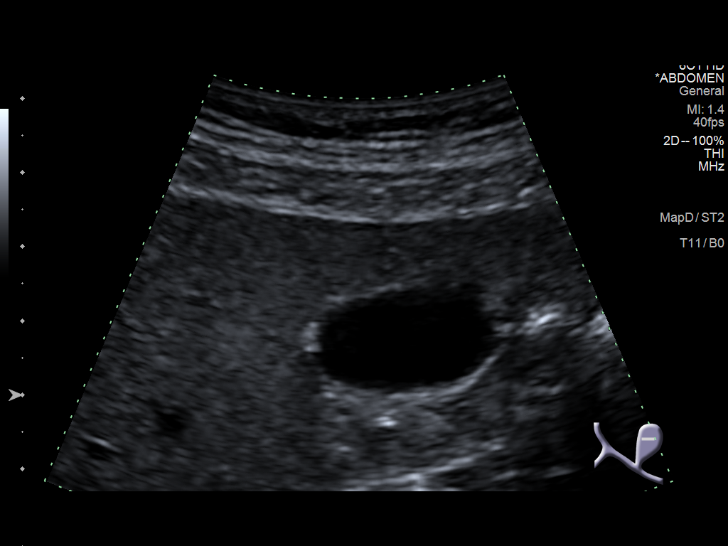
[im 20/115]
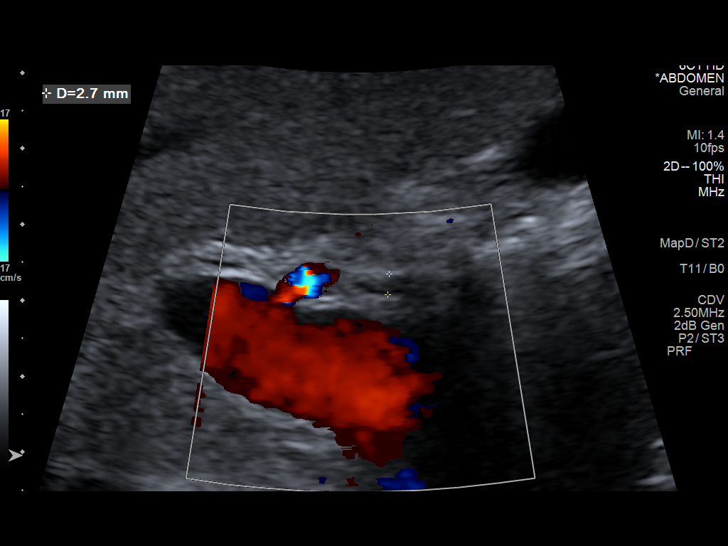
[im 29/115]
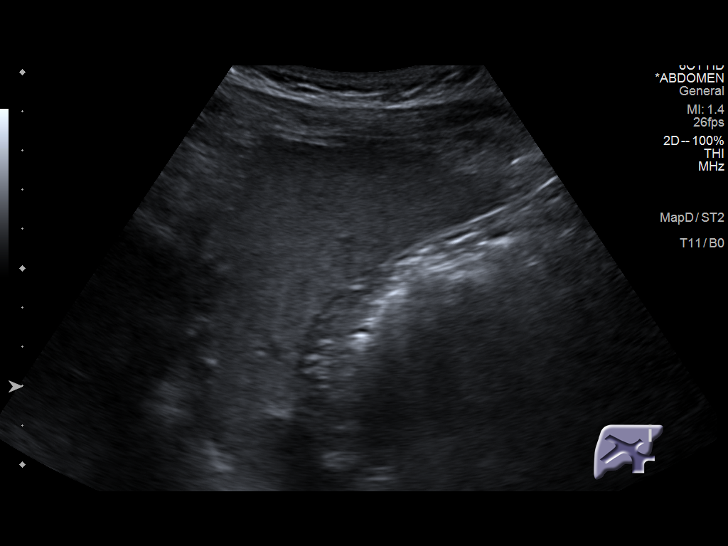
[im 39/115]
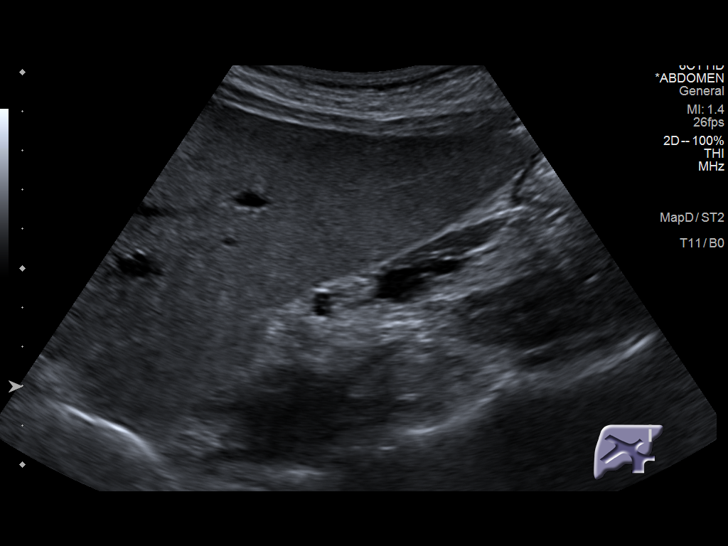
[im 43/115]
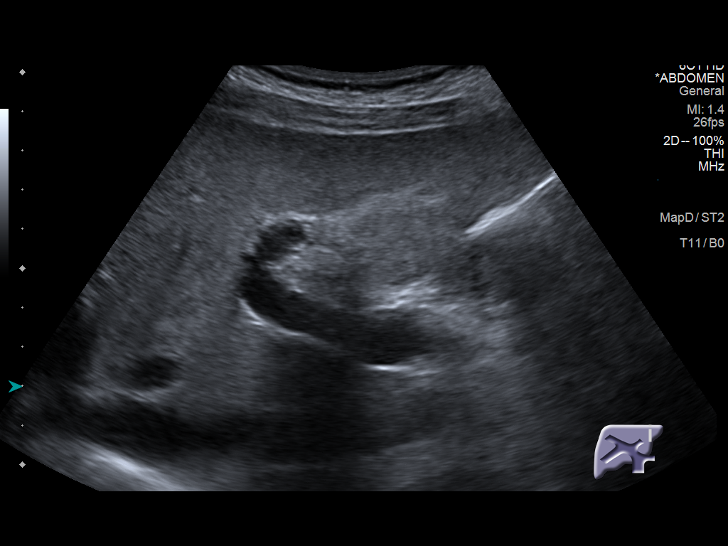
[im 53/115]
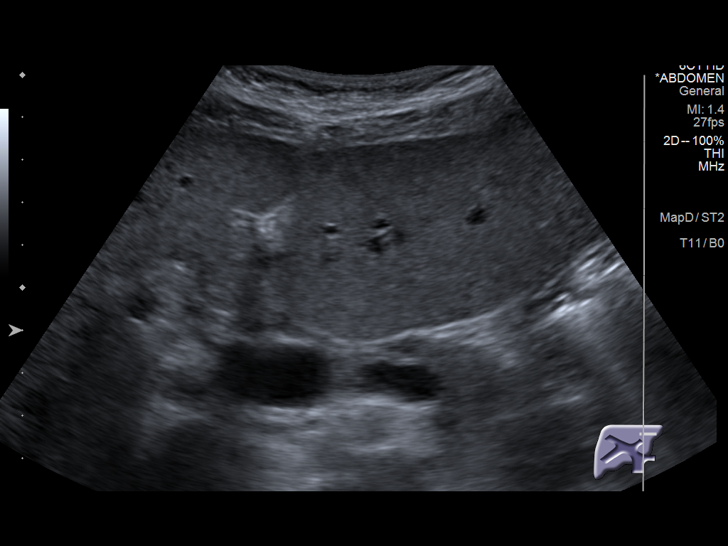
[im 62/115]
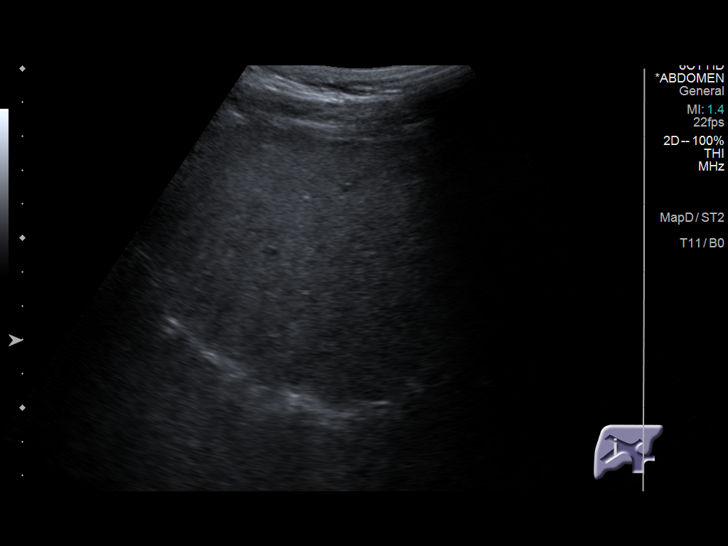
[im 72/115]
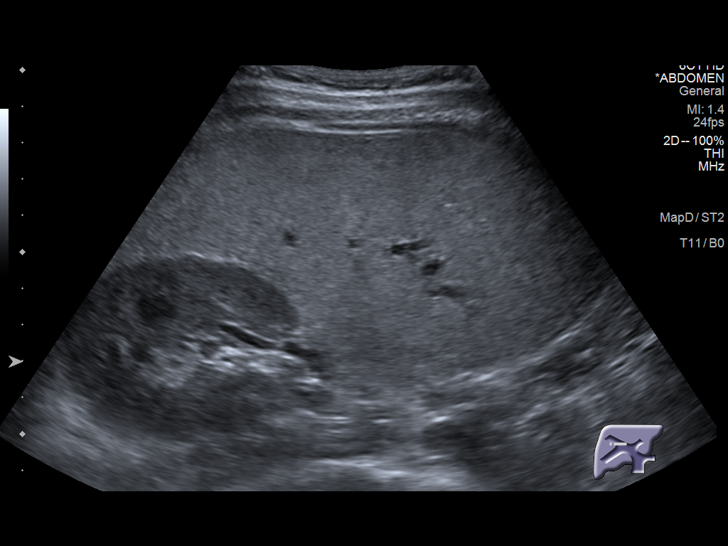
[im 77/115]
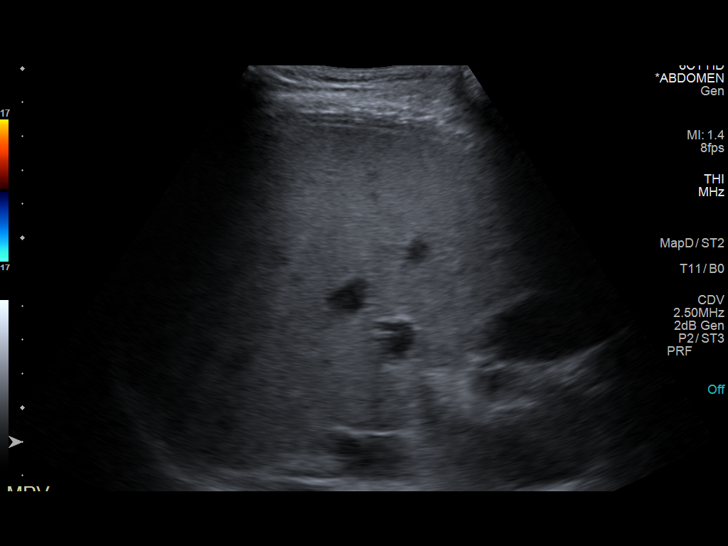
[im 86/115]
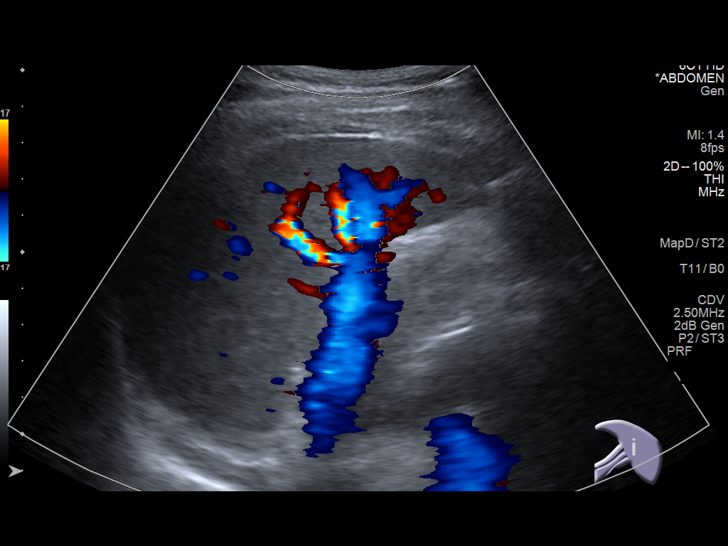
[im 96/115]
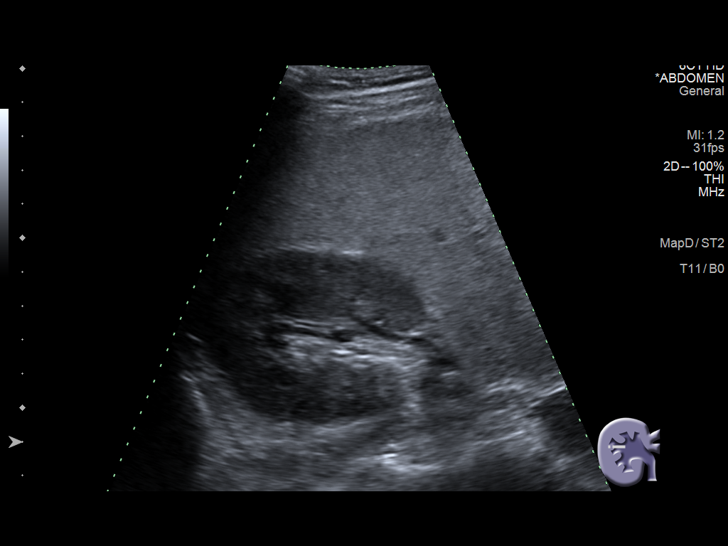
[im 105/115]
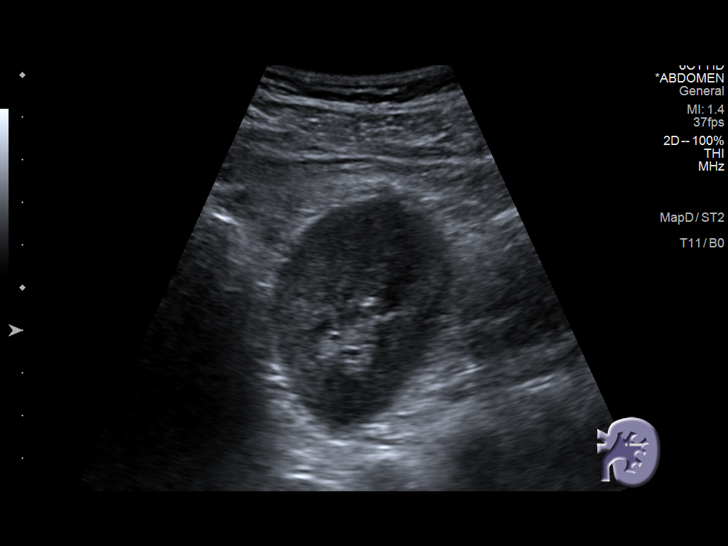
[im 115/115]
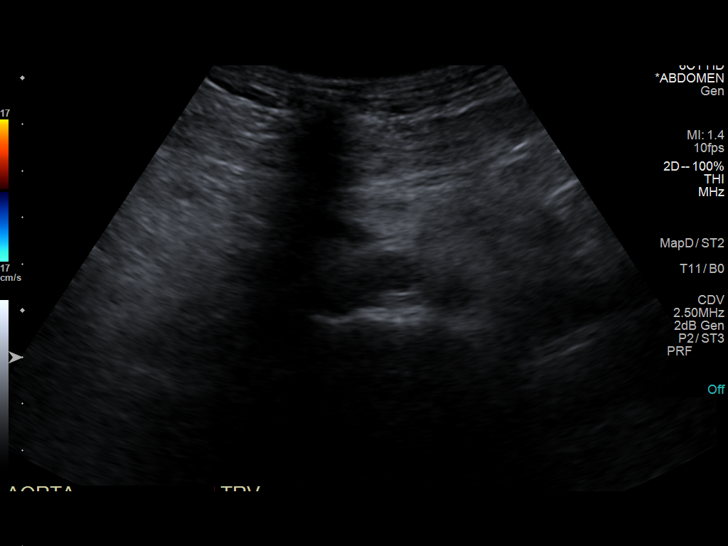

[14 of 25 positions shown; findings below may reference images not displayed]

FINDINGS: Gallbladder: Normally distended without stones or wall thickening.
No pericholecystic fluid or sonographic Murphy sign.

Common bile duct: Diameter: 3 mm, normal

Liver: Echogenic parenchyma, likely fatty infiltration though this
can be seen with cirrhosis and certain infiltrative disorders. No
focal hepatic mass or nodularity. Portal vein is patent on color
Doppler imaging with normal direction of blood flow towards the
liver.

IVC: Normal appearance

Pancreas: Head and distal tail obscured by bowel gas, visualized
portion normal appearance

Spleen: Normal appearance, 9.9 cm length

Right Kidney: Length: 12.1 cm. Normal morphology without mass or
hydronephrosis.

Left Kidney: Length: 12.7 cm. Normal morphology without mass or
hydronephrosis.

Abdominal aorta: Normal caliber

Other findings: No free fluid
IMPRESSION: Suspected fatty infiltration of liver as above.

Incomplete pancreatic visualization.

Otherwise negative exam.

## 2019-03-05 MED ORDER — IOHEXOL 300 MG/ML  SOLN
100.0000 mL | Freq: Once | INTRAMUSCULAR | Status: AC | PRN
  Administered 2019-03-05: 75 mL via INTRAVENOUS

## 2019-03-05 NOTE — Telephone Encounter (Signed)
Mariana from [facility] called she is able to do the [place] except the Elastography. Per PCP do [place] and if the other part is needed we can send him somewhere else. Mariana notified. No other questions.

## 2019-03-06 LAB — LIPID PANEL W/REFLEX DIRECT LDL
Cholesterol: 171 mg/dL (ref <200)
HDL: 58 mg/dL (ref >=40)
LDL Cholesterol (Calc): 94 mg/dL (calc)
Non-HDL Cholesterol (Calc): 113 mg/dL (calc) (ref <130)
Total CHOL/HDL Ratio: 2.9 (calc) (ref <5.0)
Triglycerides: 98 mg/dL (ref <150)

## 2019-03-06 LAB — COMPLETE METABOLIC PANEL WITH GFR
AG Ratio: 1.2 (calc) (ref 1.0–2.5)
ALT: 104 U/L — ABNORMAL HIGH (ref 9–46)
AST: 93 U/L — ABNORMAL HIGH (ref 10–40)
Albumin: 4.4 g/dL (ref 3.6–5.1)
Alkaline phosphatase (APISO): 139 U/L — ABNORMAL HIGH (ref 36–130)
BUN/Creatinine Ratio: 5 (calc) — ABNORMAL LOW (ref 6–22)
BUN: 4 mg/dL — ABNORMAL LOW (ref 7–25)
CO2: 25 mmol/L (ref 20–32)
Calcium: 8.6 mg/dL (ref 8.6–10.3)
Chloride: 101 mmol/L (ref 98–110)
Creat: 0.73 mg/dL (ref 0.60–1.35)
GFR, Est African American: 137 mL/min/{1.73_m2} (ref >=60)
GFR, Est Non African American: 118 mL/min/{1.73_m2} (ref >=60)
Globulin: 3.6 g/dL (calc) (ref 1.9–3.7)
Glucose, Bld: 95 mg/dL (ref 65–99)
Potassium: 4 mmol/L (ref 3.5–5.3)
Sodium: 136 mmol/L (ref 135–146)
Total Bilirubin: 0.7 mg/dL (ref 0.2–1.2)
Total Protein: 8 g/dL (ref 6.1–8.1)

## 2019-03-06 LAB — CBC
HCT: 36 % — ABNORMAL LOW (ref 38.5–50.0)
Hemoglobin: 12.9 g/dL — ABNORMAL LOW (ref 13.2–17.1)
MCH: 35.8 pg — ABNORMAL HIGH (ref 27.0–33.0)
MCHC: 35.8 g/dL (ref 32.0–36.0)
MCV: 100 fL (ref 80.0–100.0)
MPV: 9.6 fL (ref 7.5–12.5)
Platelets: 119 10*3/uL — ABNORMAL LOW (ref 140–400)
RBC: 3.6 10*6/uL — ABNORMAL LOW (ref 4.20–5.80)
RDW: 11.7 % (ref 11.0–15.0)
WBC: 6.4 10*3/uL (ref 3.8–10.8)

## 2019-03-06 LAB — TESTOSTERONE, FREE & TOTAL
Free Testosterone: 32.4 pg/mL — ABNORMAL LOW (ref 35.0–155.0)
Testosterone, Total, LC-MS-MS: 340 ng/dL (ref 250–1100)

## 2019-03-06 LAB — CK: Total CK: 166 U/L (ref 44–196)

## 2019-03-06 LAB — VITAMIN B12: Vitamin B-12: 610 pg/mL (ref 200–1100)

## 2019-03-06 LAB — VITAMIN D 25 HYDROXY (VIT D DEFICIENCY, FRACTURES): Vit D, 25-Hydroxy: 8 ng/mL — ABNORMAL LOW (ref 30–100)

## 2019-03-06 LAB — TSH: TSH: 1.97 mIU/L (ref 0.40–4.50)

## 2019-03-12 ENCOUNTER — Other Ambulatory Visit: Payer: Self-pay

## 2019-03-12 ENCOUNTER — Ambulatory Visit (INDEPENDENT_AMBULATORY_CARE_PROVIDER_SITE_OTHER): Payer: Commercial Managed Care - PPO | Admitting: Sports Medicine

## 2019-03-12 VITALS — BP 125/81 | HR 98 | Ht 66.0 in | Wt 138.0 lb

## 2019-03-12 DIAGNOSIS — F1010 Alcohol abuse, uncomplicated: Secondary | ICD-10-CM

## 2019-03-12 DIAGNOSIS — J449 Chronic obstructive pulmonary disease, unspecified: Secondary | ICD-10-CM

## 2019-03-12 DIAGNOSIS — F411 Generalized anxiety disorder: Secondary | ICD-10-CM

## 2019-03-12 MED ORDER — BUSPIRONE HCL 10 MG PO TABS: 10.0000 mg | ORAL_TABLET | Freq: Two times a day (BID) | ORAL | 3 refills | Status: DC

## 2019-03-12 MED ORDER — ALBUTEROL SULFATE HFA 108 (90 BASE) MCG/ACT IN AERS
2.0000 | INHALATION_SPRAY | Freq: Once | RESPIRATORY_TRACT | Status: AC
  Administered 2019-03-12: 2 via RESPIRATORY_TRACT

## 2019-03-12 MED ORDER — ANORO ELLIPTA 62.5-25 MCG/INH IN AEPB: 1.0000 | INHALATION_SPRAY | Freq: Every day | RESPIRATORY_TRACT | 11 refills | Status: DC

## 2019-03-12 NOTE — Progress Notes (Signed)
Patient in for breathing test.

## 2019-03-12 NOTE — Assessment & Plan Note (Signed)
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 He is also a heavy smoker. Chest CT showed linear scarring. Today we performed pre and postbronchodilator spirometry which shows moderate obstruction, slight improvement with bronchodilator.  FEV1 over FVC 71% predicted. Starting Anoro inhaled daily. Again we have recommended respiratory protection during work and smoking cessation. Return in a month, if persistent symptoms we will add Trelegy instead.

## 2019-03-12 NOTE — Progress Notes (Signed)
    Procedures performed today:    Prolonged pre and postbronchodilator spirometry on 03/12/2019 shows moderate obstruction, slight improvement with bronchodilator.  FEV1 over FVC 71% predicted.  Independent interpretation of tests performed by another provider:   None.  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 He is also a heavy smoker. Chest CT showed linear scarring. Today we performed pre and postbronchodilator spirometry which shows moderate obstruction, slight improvement with bronchodilator.  FEV1 over FVC 71% predicted. Starting Anoro inhaled daily. Again we have recommended respiratory protection during work and smoking cessation. Return in a month, if persistent symptoms we will add Trelegy instead.  Generalized anxiety disorder Generalized anxiety, poor sleep. Principal symptom is agoraphobia. I started BuSpar 5 mg 3 times daily, we were avoiding SSRIs due to concurrent erectile dysfunction. He is having difficulty taking it 3 times daily, so we will switch to BuSpar 10 mg twice daily which will be more convenient dosing. Keep previous follow-up.    ___________________________________________ Zachary Mata. Benjamin Stain, M.D., ABFM., CAQSM. Primary Care and Sports Medicine [facility]  Adjunct Instructor of Family Medicine  [employer]

## 2019-03-12 NOTE — Addendum Note (Signed)
Addended by: Arvilla Market on: 03/12/2019 03:49 PM   Modules accepted: Orders

## 2019-03-12 NOTE — Assessment & Plan Note (Signed)
Generalized anxiety, poor sleep. Principal symptom is agoraphobia. I started BuSpar 5 mg 3 times daily, we were avoiding SSRIs due to concurrent erectile dysfunction. He is having difficulty taking it 3 times daily, so we will switch to BuSpar 10 mg twice daily which will be more convenient dosing. Keep previous follow-up.

## 2019-04-09 ENCOUNTER — Encounter: Payer: Self-pay | Admitting: Sports Medicine

## 2019-04-09 ENCOUNTER — Ambulatory Visit (INDEPENDENT_AMBULATORY_CARE_PROVIDER_SITE_OTHER): Payer: Commercial Managed Care - PPO | Admitting: Sports Medicine

## 2019-04-09 DIAGNOSIS — H1013 Acute atopic conjunctivitis, bilateral: Secondary | ICD-10-CM | POA: Diagnosis not present

## 2019-04-09 DIAGNOSIS — F411 Generalized anxiety disorder: Secondary | ICD-10-CM

## 2019-04-09 DIAGNOSIS — I10 Essential (primary) hypertension: Secondary | ICD-10-CM | POA: Diagnosis not present

## 2019-04-09 DIAGNOSIS — J449 Chronic obstructive pulmonary disease, unspecified: Secondary | ICD-10-CM | POA: Diagnosis not present

## 2019-04-09 DIAGNOSIS — J309 Allergic rhinitis, unspecified: Secondary | ICD-10-CM

## 2019-04-09 MED ORDER — BUSPIRONE HCL 15 MG PO TABS: ORAL_TABLET | ORAL | 3 refills | Status: DC

## 2019-04-09 MED ORDER — LISINOPRIL 20 MG PO TABS: 20.0000 mg | ORAL_TABLET | Freq: Every day | ORAL | 3 refills | Status: DC

## 2019-04-09 MED ORDER — FLUTICASONE PROPIONATE 50 MCG/ACT NA SUSP: NASAL | 3 refills | Status: DC

## 2019-04-09 MED ORDER — AZELASTINE HCL 0.05 % OP SOLN: 2.0000 [drp] | Freq: Two times a day (BID) | OPHTHALMIC | 11 refills | Status: DC

## 2019-04-09 NOTE — Progress Notes (Signed)
    Procedures performed today:    None.  Independent interpretation of notes and tests performed by another provider:   None.  Impression and Recommendations:    COPD (chronic obstructive pulmonary disease) Zachary Mata is doing much better with Anoro daily. He never got his short acting bronchodilator. PFTs did show evidence of chronic moderate obstruction. Because he is experiencing good symptom relief we are going to hold off on switching to Trelegy.  Generalized anxiety disorder Persistence of anxiety, increasing BuSpar, we were holding off on SSRIs due to his concurrent erectile dysfunction though if we go to maximum dose BuSpar and he still does not have symptom relief we will have to switch to an SSRI.  Allergic conjunctivitis and rhinitis, bilateral Increase in itchy eyes, runny nose. He has been encouraged to wear eye and breathing protection when at work. Adding Optivar and Flonase. We can follow this up in a month.  Essential hypertension Blood pressure continues to be elevated, increasing lisinopril to 20 mg daily.    ___________________________________________ Zachary Mata. Benjamin Stain, M.D., ABFM., CAQSM. Primary Care and Sports Medicine [facility]  Adjunct Instructor of Family Medicine  [employer]

## 2019-04-09 NOTE — Assessment & Plan Note (Signed)
Blood pressure continues to be elevated, increasing lisinopril to 20 mg daily.

## 2019-04-09 NOTE — Assessment & Plan Note (Signed)
Persistence of anxiety, increasing BuSpar, we were holding off on SSRIs due to his concurrent erectile dysfunction though if we go to maximum dose BuSpar and he still does not have symptom relief we will have to switch to an SSRI.

## 2019-04-09 NOTE — Assessment & Plan Note (Signed)
Increase in itchy eyes, runny nose. He has been encouraged to wear eye and breathing protection when at work. Adding Optivar and Flonase. We can follow this up in a month.

## 2019-04-09 NOTE — Assessment & Plan Note (Signed)
Matas is doing much better with Anoro daily. He never got his short acting bronchodilator. PFTs did show evidence of chronic moderate obstruction. Because he is experiencing good symptom relief we are going to hold off on switching to Trelegy.

## 2019-04-15 NOTE — Addendum Note (Signed)
Addended by: Chalmers Cater on: 04/15/2019 11:41 AM   Modules accepted: Orders

## 2019-05-02 ENCOUNTER — Other Ambulatory Visit: Payer: Self-pay | Admitting: Sports Medicine

## 2019-05-02 DIAGNOSIS — R7401 Elevation of levels of liver transaminase levels: Secondary | ICD-10-CM

## 2019-05-14 ENCOUNTER — Ambulatory Visit: Payer: Commercial Managed Care - PPO | Admitting: Sports Medicine

## 2019-06-22 ENCOUNTER — Telehealth: Payer: Self-pay | Admitting: Sports Medicine

## 2019-06-22 DIAGNOSIS — N469 Male infertility, unspecified: Secondary | ICD-10-CM | POA: Insufficient documentation

## 2019-06-22 NOTE — Telephone Encounter (Signed)
Dr T   Patient's wife called regarding referral for sperm count check.  He is off this week and would like to have that referral placed to see if he can get scheduled soon.  Please Advise  Thank you  Arline Asp

## 2019-06-22 NOTE — Assessment & Plan Note (Signed)
Orders placed for semen analysis. 

## 2019-06-22 NOTE — Telephone Encounter (Signed)
Orders placed for semen analysis.

## 2019-07-22 ENCOUNTER — Other Ambulatory Visit: Payer: Self-pay | Admitting: Sports Medicine

## 2019-07-22 DIAGNOSIS — N469 Male infertility, unspecified: Secondary | ICD-10-CM

## 2019-07-22 NOTE — Assessment & Plan Note (Signed)
Semen analysis reviewed, good semen volume but azoospermia, I am going to refer him to urology.

## 2019-11-19 ENCOUNTER — Ambulatory Visit (INDEPENDENT_AMBULATORY_CARE_PROVIDER_SITE_OTHER): Payer: Commercial Managed Care - PPO | Admitting: Ophthalmology

## 2019-11-19 ENCOUNTER — Other Ambulatory Visit: Payer: Self-pay

## 2019-11-19 ENCOUNTER — Encounter (INDEPENDENT_AMBULATORY_CARE_PROVIDER_SITE_OTHER): Payer: Self-pay | Admitting: Ophthalmology

## 2019-11-19 DIAGNOSIS — H5360 Unspecified night blindness: Secondary | ICD-10-CM

## 2019-11-19 DIAGNOSIS — S0285XS Fracture of orbit, unspecified, sequela: Secondary | ICD-10-CM

## 2019-11-19 DIAGNOSIS — H469 Unspecified optic neuritis: Secondary | ICD-10-CM

## 2019-11-19 DIAGNOSIS — H3589 Other specified retinal disorders: Secondary | ICD-10-CM | POA: Diagnosis not present

## 2019-11-19 DIAGNOSIS — I10 Essential (primary) hypertension: Secondary | ICD-10-CM

## 2019-11-19 DIAGNOSIS — H25813 Combined forms of age-related cataract, bilateral: Secondary | ICD-10-CM

## 2019-11-19 DIAGNOSIS — H3581 Retinal edema: Secondary | ICD-10-CM | POA: Diagnosis not present

## 2019-11-19 DIAGNOSIS — H35033 Hypertensive retinopathy, bilateral: Secondary | ICD-10-CM

## 2019-11-19 NOTE — Progress Notes (Addendum)
[facility] - Clinic Note  11/19/2019     CHIEF COMPLAINT Patient presents for Retina Evaluation   HISTORY OF PRESENT ILLNESS: Zachary Mata is a 39 y.o. male who presents to the clinic today for:   HPI    Retina Evaluation    In both eyes.  This started months ago (3-6).  Duration of months (3-6).  Associated Symptoms Floaters.  Negative for Flashes, Pain, Trauma, Fever, Redness, Scalp Tenderness, Weight Loss, Distortion, Photophobia, Jaw Claudication, Fatigue, Blind Spot, Glare and Shoulder/Hip pain.  Context:  night driving, dim lighting, distance vision, near vision and reading.  Treatments tried include no treatments.  I, the attending physician,  performed the HPI with the patient and updated documentation appropriately.          Comments    Patient c/o decrease in night time vision. Vision is bad from sundown to sunup. Patient works at [employer]. Has trouble at work seeing screw heads, especially in dim lighting. History of high liver enzymes several years ago. No other health problems, other than possible high blood pressure, but patient not on meds. No diabetes. Symptoms for the past 3-6 months.        Last edited by Rennis Chris, MD on 11/19/2019  3:17 PM. (History)    Patient states vision worse for the past 6 months. Can't see in low light conditions. Denies family history of problems with night time vision. Only drug use in high school, 20 or more years ago. History of orbital fracture OS as a teenager. Patient denies headaches, numbness, tingling.  Referring physician: Glenford Peers, OD [address] [place],  [place] [address]  HISTORICAL INFORMATION:   Selected notes from the MEDICAL RECORD NUMBER Referred by Dr. Nedra Hai:  Ocular Hx- PMH-    CURRENT MEDICATIONS: Current Outpatient Medications (Ophthalmic Drugs)  Medication Sig  . azelastine (OPTIVAR) 0.05 % ophthalmic solution Place 2 drops into both eyes 2 (two)  times daily. (Patient not taking: Reported on 11/19/2019)   No current facility-administered medications for this visit. (Ophthalmic Drugs)   Current Outpatient Medications (Other)  Medication Sig  . cetirizine (ZYRTEC) 10 MG chewable tablet Chew 10 mg by mouth daily.  . busPIRone (BUSPAR) 15 MG tablet 1 tablet p.o. twice daily to 3 times daily (Patient not taking: Reported on 11/19/2019)  . fluticasone (FLONASE) 50 MCG/ACT nasal spray One spray in each nostril twice a day (Patient not taking: Reported on 11/19/2019)  . lisinopril (ZESTRIL) 20 MG tablet Take 1 tablet (20 mg total) by mouth daily. (Patient not taking: Reported on 11/19/2019)  . tadalafil (CIALIS) 5 MG tablet Take 1 tablet (5 mg total) by mouth daily as needed for erectile dysfunction. (Patient not taking: Reported on 11/19/2019)  . umeclidinium-vilanterol (ANORO ELLIPTA) 62.5-25 MCG/INH AEPB Inhale 1 puff into the lungs daily. (Patient not taking: Reported on 11/19/2019)  . Vitamin D, Ergocalciferol, (DRISDOL) 1.25 MG (50000 UNIT) CAPS capsule Take 1 capsule (50,000 Units total) by mouth every 7 (seven) days. Take for 8 total doses(weeks) (Patient not taking: Reported on 11/19/2019)   No current facility-administered medications for this visit. (Other)      REVIEW OF SYSTEMS: ROS    Positive for: Eyes   Negative for: Constitutional, Gastrointestinal, Neurological, Skin, Genitourinary, Musculoskeletal, HENT, Endocrine, Cardiovascular, Respiratory, Psychiatric, Allergic/Imm, Heme/Lymph   Last edited by Annalee Genta D, COT on 11/19/2019  2:47 PM. (History)       ALLERGIES Allergies  Allergen Reactions  .  Sulfa Antibiotics Swelling    PAST MEDICAL HISTORY Past Medical History:  Diagnosis Date  . Chronic back pain 03/02/2012  . Essential hypertension 03/02/2012  . Hypertension    Past Surgical History:  Procedure Laterality Date  . HAND SURGERY    . WRIST SURGERY      FAMILY HISTORY Family History  Problem  Relation Age of Onset  . Cancer Mother        breats CA  . Diabetes Mother   . Breast cancer Other     SOCIAL HISTORY Social History   Tobacco Use  . Smoking status: Current Every Day Smoker    Packs/day: 2.00    Years: 15.00    Pack years: 30.00    Types: Cigarettes  . Smokeless tobacco: Never Used  Vaping Use  . Vaping Use: Never used  Substance Use Topics  . Alcohol use: Yes    Alcohol/week: 6.0 - 7.0 standard drinks    Types: 6 - 7 Cans of beer per week  . Drug use: No         OPHTHALMIC EXAM:  Base Eye Exam    Visual Acuity (Snellen - Linear)      Right Left   Dist [place] 20/20 -1 20/25 -2   Dist ph [place] 20/20 -1       Tonometry (Tonopen, 3:06 PM)      Right Left   Pressure 15 16       Pupils      Dark Light Shape React APD   Right 4 3 Round Brisk None   Left 4 4 Round Minimal +2       Visual Fields (Counting fingers)      Left Right    Full Full       Extraocular Movement      Right Left    Full, Ortho Full, Ortho       Neuro/Psych    Oriented x3: Yes   Mood/Affect: Normal       Dilation    Both eyes: 1.0% Mydriacyl, 2.5% Phenylephrine @ 3:06 PM        Additional Tests    Color      Right Left   [place] 15/25 14/25  Red desaturation OS        Slit Lamp and Fundus Exam    Slit Lamp Exam      Right Left   Lids/Lashes dermatochalasis, mild mgd dermatochalasis, mild mgd   Conjunctiva/Sclera white and quiet, mild nasal pinguecula white and quiet, mild nasal and temporal pinguecula   Cornea 2+ inf PEE nasal pinguecula, 3+ inf PEE   Anterior Chamber deep and clear deep and clear   Iris round and moderately dilated round, moderately dilated   Lens 1-2+ NS, 1-2+ CS 1-2+ NS, 1-2+ CS   Vitreous syneresis syneresis       Fundus Exam      Right Left   Disc pink and sharp, compact pink and sharp, compact   C/D Ratio 0.3 0.3   Macula flat, good foveal reflex, mild RPE mottling, no heme flat, good foveal reflex, mild RPE mottling, no  heme, focal CWS nasal macula   Vessels mild attenuation, tortuosity mild attenuation, tortuosity, mild A/V crossing changes   Periphery attached, no heme attached, no heme        Refraction    Manifest Refraction      Sphere Cylinder Axis Dist VA   Right -0.75 +0.25 165 20/20-2   Left -1.00 +  0.75 140 20/25          IMAGING AND PROCEDURES  Imaging and Procedures for 11/19/2019  OCT, Retina - OU - Both Eyes       Right Eye Quality was good. Central Foveal Thickness: 208. Progression has no prior data. Findings include normal foveal contour, no IRF, no SRF (Trace ERM, ? Diffuse retinal thinning, mild decreae in ellipsoid signal nasal perifoveal region).   Left Eye Central Foveal Thickness: 212. Progression has no prior data. Findings include normal foveal contour, no IRF, no SRF (Mild decrease in perifoveal ellipsoid signal).   Notes *Images captured and stored on drive  Diagnosis / Impression:  NFP, no IRF/SRF OU OD: Trace ERM, ? Diffuse retinal thinning, mild decrease in ellipsoid signal nasal perifoveal region OS: Mild decrease in perifoveal ellipsoid signal  Clinical management:  See below  Abbreviations: NFP - Normal foveal profile. CME - cystoid macular edema. PED - pigment epithelial detachment. IRF - intraretinal fluid. SRF - subretinal fluid. EZ - ellipsoid zone. ERM - epiretinal membrane. ORA - outer retinal atrophy. ORT - outer retinal tubulation. SRHM - subretinal hyper-reflective material. IRHM - intraretinal hyper-reflective material                 ASSESSMENT/PLAN:    ICD-10-CM   1. Night blindness  H53.60   2. 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 Retinal edema  H35.81 OCT, Retina - OU - Both Eyes  5. Essential hypertension  I10   6. Hypertensive retinopathy of both eyes  H35.033   7. Combined forms of age-related cataract of both eyes  H25.813   8. Closed fracture of orbit, sequela (HCC)  S02.85XS     1-3. Night  blindness w/ optic neuropathy OS and mild outer retinal atrophy OU  - pt reports chief complaint of night blindness  - BCVA good -- 20/20 OU  - slit lamp and fundus exams structurally normal  - +rAPD OS on pupil exam  - OCT shows mild parafoveal ellipsoid thinning OU (OS > OD)  - discussed findings  - recommend FA and HVF for further evaluation  - return in 2 weeks for VF, FA transit OS  4. No retinal edema on exam or OCT  - OCT w/ mild parafoveal ellipsoid thinning OU (OS > OD)  5,6. Hypertensive retinopathy OU - discussed importance of tight BP control - monitor  7. Mild mixed cataracts OU  - The symptoms of cataract, surgical options, and treatments and risks were discussed with patient.  - discussed diagnosis and progression  - not yet visually significant  - monitor for now  8. Hx of trauma w/ orbital fractures OS   Ophthalmic Meds Ordered this visit:  No orders of the defined types were placed in this encounter.      Return in about 2 weeks (around 12/03/2019) for DFE, OCT, FA transit OS, VF 24-2.  There are no Patient Instructions on file for this visit.   Explained the diagnoses, plan, and follow up with the patient and they expressed understanding.  Patient expressed understanding of the importance of proper follow up care.   This document serves as a record of services personally performed by Karie Chimera, MD, PhD. It was created on their behalf by Annalee Genta, COMT. The creation of this record is the provider's dictation and/or activities during the visit.  Electronically signed by: Annalee Genta, COMT 11/21/19 12:47 AM  Karie Chimera, M.D.,  Ph.D. Diseases & Surgery of the Retina and Vitreous [facility]  I have reviewed the above documentation for accuracy and completeness, and I agree with the above. Karie Chimera, M.D., Ph.D. 11/21/19 12:47 AM    Abbreviations: M myopia (nearsighted); A astigmatism; H hyperopia  (farsighted); P presbyopia; Mrx spectacle prescription;  CTL contact lenses; OD right eye; OS left eye; OU both eyes  XT exotropia; ET esotropia; PEK punctate epithelial keratitis; PEE punctate epithelial erosions; DES dry eye syndrome; MGD meibomian gland dysfunction; ATs artificial tears; PFAT's preservative free artificial tears; NSC nuclear sclerotic cataract; PSC posterior subcapsular cataract; ERM epi-retinal membrane; PVD posterior vitreous detachment; RD retinal detachment; DM diabetes mellitus; DR diabetic retinopathy; NPDR non-proliferative diabetic retinopathy; PDR proliferative diabetic retinopathy; CSME clinically significant macular edema; DME diabetic macular edema; dbh dot blot hemorrhages; CWS cotton wool spot; POAG primary open angle glaucoma; C/D cup-to-disc ratio; HVF humphrey visual field; GVF goldmann visual field; OCT optical coherence tomography; IOP intraocular pressure; BRVO Branch retinal vein occlusion; CRVO central retinal vein occlusion; CRAO central retinal artery occlusion; BRAO branch retinal artery occlusion; RT retinal tear; SB scleral buckle; PPV pars plana vitrectomy; VH Vitreous hemorrhage; PRP panretinal laser photocoagulation; IVK intravitreal kenalog; VMT vitreomacular traction; MH Macular hole;  NVD neovascularization of the disc; NVE neovascularization elsewhere; AREDS age related eye disease study; ARMD age related macular degeneration; POAG primary open angle glaucoma; EBMD epithelial/anterior basement membrane dystrophy; ACIOL anterior chamber intraocular lens; IOL intraocular lens; PCIOL posterior chamber intraocular lens; Phaco/IOL phacoemulsification with intraocular lens placement; PRK photorefractive keratectomy; LASIK laser assisted in situ keratomileusis; HTN hypertension; DM diabetes mellitus; COPD chronic obstructive pulmonary disease

## 2019-11-25 ENCOUNTER — Observation Stay (HOSPITAL_COMMUNITY): Payer: Commercial Managed Care - PPO

## 2019-11-25 ENCOUNTER — Emergency Department (HOSPITAL_COMMUNITY): Payer: Commercial Managed Care - PPO

## 2019-11-25 ENCOUNTER — Inpatient Hospital Stay (HOSPITAL_COMMUNITY)
Admission: EM | Admit: 2019-11-25 | Discharge: 2019-12-04 | DRG: 432 | Disposition: A | Discharge status: Home Health | Payer: Commercial Managed Care - PPO | Attending: Family Medicine | Admitting: Family Medicine

## 2019-11-25 ENCOUNTER — Other Ambulatory Visit: Payer: Self-pay

## 2019-11-25 ENCOUNTER — Encounter (HOSPITAL_COMMUNITY): Payer: Self-pay | Admitting: Emergency Medicine

## 2019-11-25 DIAGNOSIS — R188 Other ascites: Secondary | ICD-10-CM

## 2019-11-25 DIAGNOSIS — Z882 Allergy status to sulfonamides status: Secondary | ICD-10-CM

## 2019-11-25 DIAGNOSIS — D62 Acute posthemorrhagic anemia: Secondary | ICD-10-CM | POA: Diagnosis present

## 2019-11-25 DIAGNOSIS — E7220 Disorder of urea cycle metabolism, unspecified: Secondary | ICD-10-CM

## 2019-11-25 DIAGNOSIS — E559 Vitamin D deficiency, unspecified: Secondary | ICD-10-CM | POA: Diagnosis present

## 2019-11-25 DIAGNOSIS — G8929 Other chronic pain: Secondary | ICD-10-CM | POA: Diagnosis present

## 2019-11-25 DIAGNOSIS — Z20822 Contact with and (suspected) exposure to covid-19: Secondary | ICD-10-CM | POA: Diagnosis present

## 2019-11-25 DIAGNOSIS — K7010 Alcoholic hepatitis without ascites: Secondary | ICD-10-CM | POA: Diagnosis present

## 2019-11-25 DIAGNOSIS — J690 Pneumonitis due to inhalation of food and vomit: Secondary | ICD-10-CM | POA: Diagnosis not present

## 2019-11-25 DIAGNOSIS — K7011 Alcoholic hepatitis with ascites: Secondary | ICD-10-CM

## 2019-11-25 DIAGNOSIS — R1084 Generalized abdominal pain: Secondary | ICD-10-CM

## 2019-11-25 DIAGNOSIS — R140 Abdominal distension (gaseous): Secondary | ICD-10-CM

## 2019-11-25 DIAGNOSIS — R161 Splenomegaly, not elsewhere classified: Secondary | ICD-10-CM | POA: Diagnosis present

## 2019-11-25 DIAGNOSIS — R17 Unspecified jaundice: Secondary | ICD-10-CM

## 2019-11-25 DIAGNOSIS — E8770 Fluid overload, unspecified: Secondary | ICD-10-CM | POA: Diagnosis present

## 2019-11-25 DIAGNOSIS — K759 Inflammatory liver disease, unspecified: Secondary | ICD-10-CM | POA: Diagnosis present

## 2019-11-25 DIAGNOSIS — Z09 Encounter for follow-up examination after completed treatment for conditions other than malignant neoplasm: Secondary | ICD-10-CM

## 2019-11-25 DIAGNOSIS — E871 Hypo-osmolality and hyponatremia: Secondary | ICD-10-CM | POA: Diagnosis present

## 2019-11-25 DIAGNOSIS — D696 Thrombocytopenia, unspecified: Secondary | ICD-10-CM

## 2019-11-25 DIAGNOSIS — K7040 Alcoholic hepatic failure without coma: Secondary | ICD-10-CM | POA: Diagnosis present

## 2019-11-25 DIAGNOSIS — K7031 Alcoholic cirrhosis of liver with ascites: Secondary | ICD-10-CM | POA: Diagnosis present

## 2019-11-25 DIAGNOSIS — F1010 Alcohol abuse, uncomplicated: Secondary | ICD-10-CM | POA: Diagnosis present

## 2019-11-25 DIAGNOSIS — J449 Chronic obstructive pulmonary disease, unspecified: Secondary | ICD-10-CM | POA: Diagnosis present

## 2019-11-25 DIAGNOSIS — I8511 Secondary esophageal varices with bleeding: Secondary | ICD-10-CM | POA: Diagnosis present

## 2019-11-25 DIAGNOSIS — D539 Nutritional anemia, unspecified: Secondary | ICD-10-CM | POA: Diagnosis present

## 2019-11-25 DIAGNOSIS — Z79899 Other long term (current) drug therapy: Secondary | ICD-10-CM

## 2019-11-25 DIAGNOSIS — I10 Essential (primary) hypertension: Secondary | ICD-10-CM | POA: Diagnosis present

## 2019-11-25 DIAGNOSIS — D689 Coagulation defect, unspecified: Secondary | ICD-10-CM | POA: Diagnosis present

## 2019-11-25 DIAGNOSIS — F10239 Alcohol dependence with withdrawal, unspecified: Secondary | ICD-10-CM | POA: Diagnosis not present

## 2019-11-25 DIAGNOSIS — E876 Hypokalemia: Secondary | ICD-10-CM | POA: Diagnosis present

## 2019-11-25 DIAGNOSIS — Z7141 Alcohol abuse counseling and surveillance of alcoholic: Secondary | ICD-10-CM | POA: Diagnosis present

## 2019-11-25 DIAGNOSIS — F10939 Alcohol use, unspecified with withdrawal, unspecified: Secondary | ICD-10-CM | POA: Diagnosis present

## 2019-11-25 DIAGNOSIS — R109 Unspecified abdominal pain: Secondary | ICD-10-CM

## 2019-11-25 DIAGNOSIS — K766 Portal hypertension: Secondary | ICD-10-CM | POA: Diagnosis present

## 2019-11-25 DIAGNOSIS — E46 Unspecified protein-calorie malnutrition: Secondary | ICD-10-CM | POA: Diagnosis present

## 2019-11-25 DIAGNOSIS — F32A Depression, unspecified: Secondary | ICD-10-CM | POA: Diagnosis present

## 2019-11-25 DIAGNOSIS — D6959 Other secondary thrombocytopenia: Secondary | ICD-10-CM | POA: Diagnosis present

## 2019-11-25 DIAGNOSIS — K7200 Acute and subacute hepatic failure without coma: Secondary | ICD-10-CM | POA: Diagnosis present

## 2019-11-25 DIAGNOSIS — F17210 Nicotine dependence, cigarettes, uncomplicated: Secondary | ICD-10-CM | POA: Diagnosis present

## 2019-11-25 DIAGNOSIS — K652 Spontaneous bacterial peritonitis: Secondary | ICD-10-CM | POA: Diagnosis present

## 2019-11-25 DIAGNOSIS — F419 Anxiety disorder, unspecified: Secondary | ICD-10-CM | POA: Diagnosis present

## 2019-11-25 DIAGNOSIS — M549 Dorsalgia, unspecified: Secondary | ICD-10-CM | POA: Diagnosis present

## 2019-11-25 HISTORY — PX: IR PARACENTESIS: IMG2679

## 2019-11-25 LAB — URINALYSIS, ROUTINE W REFLEX MICROSCOPIC
Glucose, UA: 50 mg/dL — AB
Hgb urine dipstick: NEGATIVE
Ketones, ur: NEGATIVE mg/dL
Leukocytes,Ua: NEGATIVE
Nitrite: NEGATIVE
Protein, ur: 30 mg/dL — AB
Specific Gravity, Urine: 1.023 (ref 1.005–1.030)
pH: 6 (ref 5.0–8.0)

## 2019-11-25 LAB — BODY FLUID CELL COUNT WITH DIFFERENTIAL
Eos, Fluid: 0 %
Lymphs, Fluid: 12 %
Monocyte-Macrophage-Serous Fluid: 80 % (ref 50–90)
Neutrophil Count, Fluid: 8 % (ref 0–25)
Total Nucleated Cell Count, Fluid: 58 cu mm (ref 0–1000)

## 2019-11-25 LAB — COMPREHENSIVE METABOLIC PANEL
ALT: 28 U/L (ref 0–44)
ALT: 29 U/L (ref 0–44)
AST: 103 U/L — ABNORMAL HIGH (ref 15–41)
AST: 109 U/L — ABNORMAL HIGH (ref 15–41)
Albumin: 1.5 g/dL — ABNORMAL LOW (ref 3.5–5.0)
Albumin: 1.4 g/dL — ABNORMAL LOW (ref 3.5–5.0)
Alkaline Phosphatase: 301 U/L — ABNORMAL HIGH (ref 38–126)
Alkaline Phosphatase: 267 U/L — ABNORMAL HIGH (ref 38–126)
Anion gap: 12 (ref 5–15)
Anion gap: 12 (ref 5–15)
BUN: <5 mg/dL — ABNORMAL LOW (ref 6–20)
BUN: <5 mg/dL — ABNORMAL LOW (ref 6–20)
CO2: 25 mmol/L (ref 22–32)
CO2: 25 mmol/L (ref 22–32)
Calcium: 7.6 mg/dL — ABNORMAL LOW (ref 8.9–10.3)
Calcium: 7.3 mg/dL — ABNORMAL LOW (ref 8.9–10.3)
Chloride: 92 mmol/L — ABNORMAL LOW (ref 98–111)
Chloride: 93 mmol/L — ABNORMAL LOW (ref 98–111)
Creatinine, Ser: 0.72 mg/dL (ref 0.61–1.24)
Creatinine, Ser: 0.63 mg/dL (ref 0.61–1.24)
GFR, Estimated: >60 mL/min (ref >60)
GFR, Estimated: >60 mL/min (ref >60)
Glucose, Bld: 112 mg/dL — ABNORMAL HIGH (ref 70–99)
Glucose, Bld: 97 mg/dL (ref 70–99)
Potassium: 2.7 mmol/L — CL (ref 3.5–5.1)
Potassium: 4 mmol/L (ref 3.5–5.1)
Sodium: 129 mmol/L — ABNORMAL LOW (ref 135–145)
Sodium: 130 mmol/L — ABNORMAL LOW (ref 135–145)
Total Bilirubin: 16.6 mg/dL — ABNORMAL HIGH (ref 0.3–1.2)
Total Bilirubin: 15.5 mg/dL — ABNORMAL HIGH (ref 0.3–1.2)
Total Protein: 7.6 g/dL (ref 6.5–8.1)
Total Protein: 7.2 g/dL (ref 6.5–8.1)

## 2019-11-25 LAB — HIV ANTIBODY (ROUTINE TESTING W REFLEX): HIV Screen 4th Generation wRfx: NONREACTIVE

## 2019-11-25 LAB — HEPATITIS C ANTIBODY: HCV Ab: NONREACTIVE

## 2019-11-25 LAB — RESPIRATORY PANEL BY RT PCR (FLU A&B, COVID)
Influenza A by PCR: NEGATIVE
Influenza B by PCR: NEGATIVE
SARS Coronavirus 2 by RT PCR: NEGATIVE

## 2019-11-25 LAB — CBC
HCT: 23.9 % — ABNORMAL LOW (ref 39.0–52.0)
HCT: 22.2 % — ABNORMAL LOW (ref 39.0–52.0)
Hemoglobin: 8.3 g/dL — ABNORMAL LOW (ref 13.0–17.0)
Hemoglobin: 7.9 g/dL — ABNORMAL LOW (ref 13.0–17.0)
MCH: 39.5 pg — ABNORMAL HIGH (ref 26.0–34.0)
MCH: 40.7 pg — ABNORMAL HIGH (ref 26.0–34.0)
MCHC: 34.7 g/dL (ref 30.0–36.0)
MCHC: 35.6 g/dL (ref 30.0–36.0)
MCV: 113.8 fL — ABNORMAL HIGH (ref 80.0–100.0)
MCV: 114.4 fL — ABNORMAL HIGH (ref 80.0–100.0)
Platelets: 126 10*3/uL — ABNORMAL LOW (ref 150–400)
Platelets: 115 10*3/uL — ABNORMAL LOW (ref 150–400)
RBC: 2.1 MIL/uL — ABNORMAL LOW (ref 4.22–5.81)
RBC: 1.94 MIL/uL — ABNORMAL LOW (ref 4.22–5.81)
RDW: 13.7 % (ref 11.5–15.5)
RDW: 14 % (ref 11.5–15.5)
WBC: 20.7 10*3/uL — ABNORMAL HIGH (ref 4.0–10.5)
WBC: 20.1 10*3/uL — ABNORMAL HIGH (ref 4.0–10.5)
nRBC: 0 % (ref 0.0–0.2)
nRBC: 0 % (ref 0.0–0.2)

## 2019-11-25 LAB — HEPATITIS PANEL, ACUTE
HCV Ab: NONREACTIVE
Hep A IgM: NONREACTIVE
Hep B C IgM: NONREACTIVE
Hepatitis B Surface Ag: NONREACTIVE

## 2019-11-25 LAB — ACETAMINOPHEN LEVEL: Acetaminophen (Tylenol), Serum: 19 ug/mL (ref 10–30)

## 2019-11-25 LAB — ALBUMIN, PLEURAL OR PERITONEAL FLUID: Albumin, Fluid: <1 g/dL

## 2019-11-25 LAB — GRAM STAIN

## 2019-11-25 LAB — HEPATITIS B SURFACE ANTIGEN: Hepatitis B Surface Ag: NONREACTIVE

## 2019-11-25 LAB — PROTIME-INR
INR: 2 — ABNORMAL HIGH (ref 0.8–1.2)
Prothrombin Time: 21.9 seconds — ABNORMAL HIGH (ref 11.4–15.2)

## 2019-11-25 LAB — PHOSPHORUS: Phosphorus: 4.4 mg/dL (ref 2.5–4.6)

## 2019-11-25 LAB — AMMONIA: Ammonia: 76 umol/L — ABNORMAL HIGH (ref 9–35)

## 2019-11-25 LAB — PROTEIN, PLEURAL OR PERITONEAL FLUID: Total protein, fluid: <3 g/dL

## 2019-11-25 LAB — TROPONIN I (HIGH SENSITIVITY)
Troponin I (High Sensitivity): 12 ng/L (ref <18)
Troponin I (High Sensitivity): 53 ng/L — ABNORMAL HIGH (ref <18)

## 2019-11-25 LAB — MAGNESIUM: Magnesium: 1.7 mg/dL (ref 1.7–2.4)

## 2019-11-25 LAB — LIPASE, BLOOD: Lipase: 65 U/L — ABNORMAL HIGH (ref 11–51)

## 2019-11-25 IMAGING — CR DG CHEST 2V
2 series · 2 of 2 positions shown · non-contrast
Comparison: Chest CT [DATE] and earlier.

CLINICAL DATA: 39-year-old male with chest pain, shortness of
breath.

EXAM:
CHEST - 2 VIEW

[chest pa]
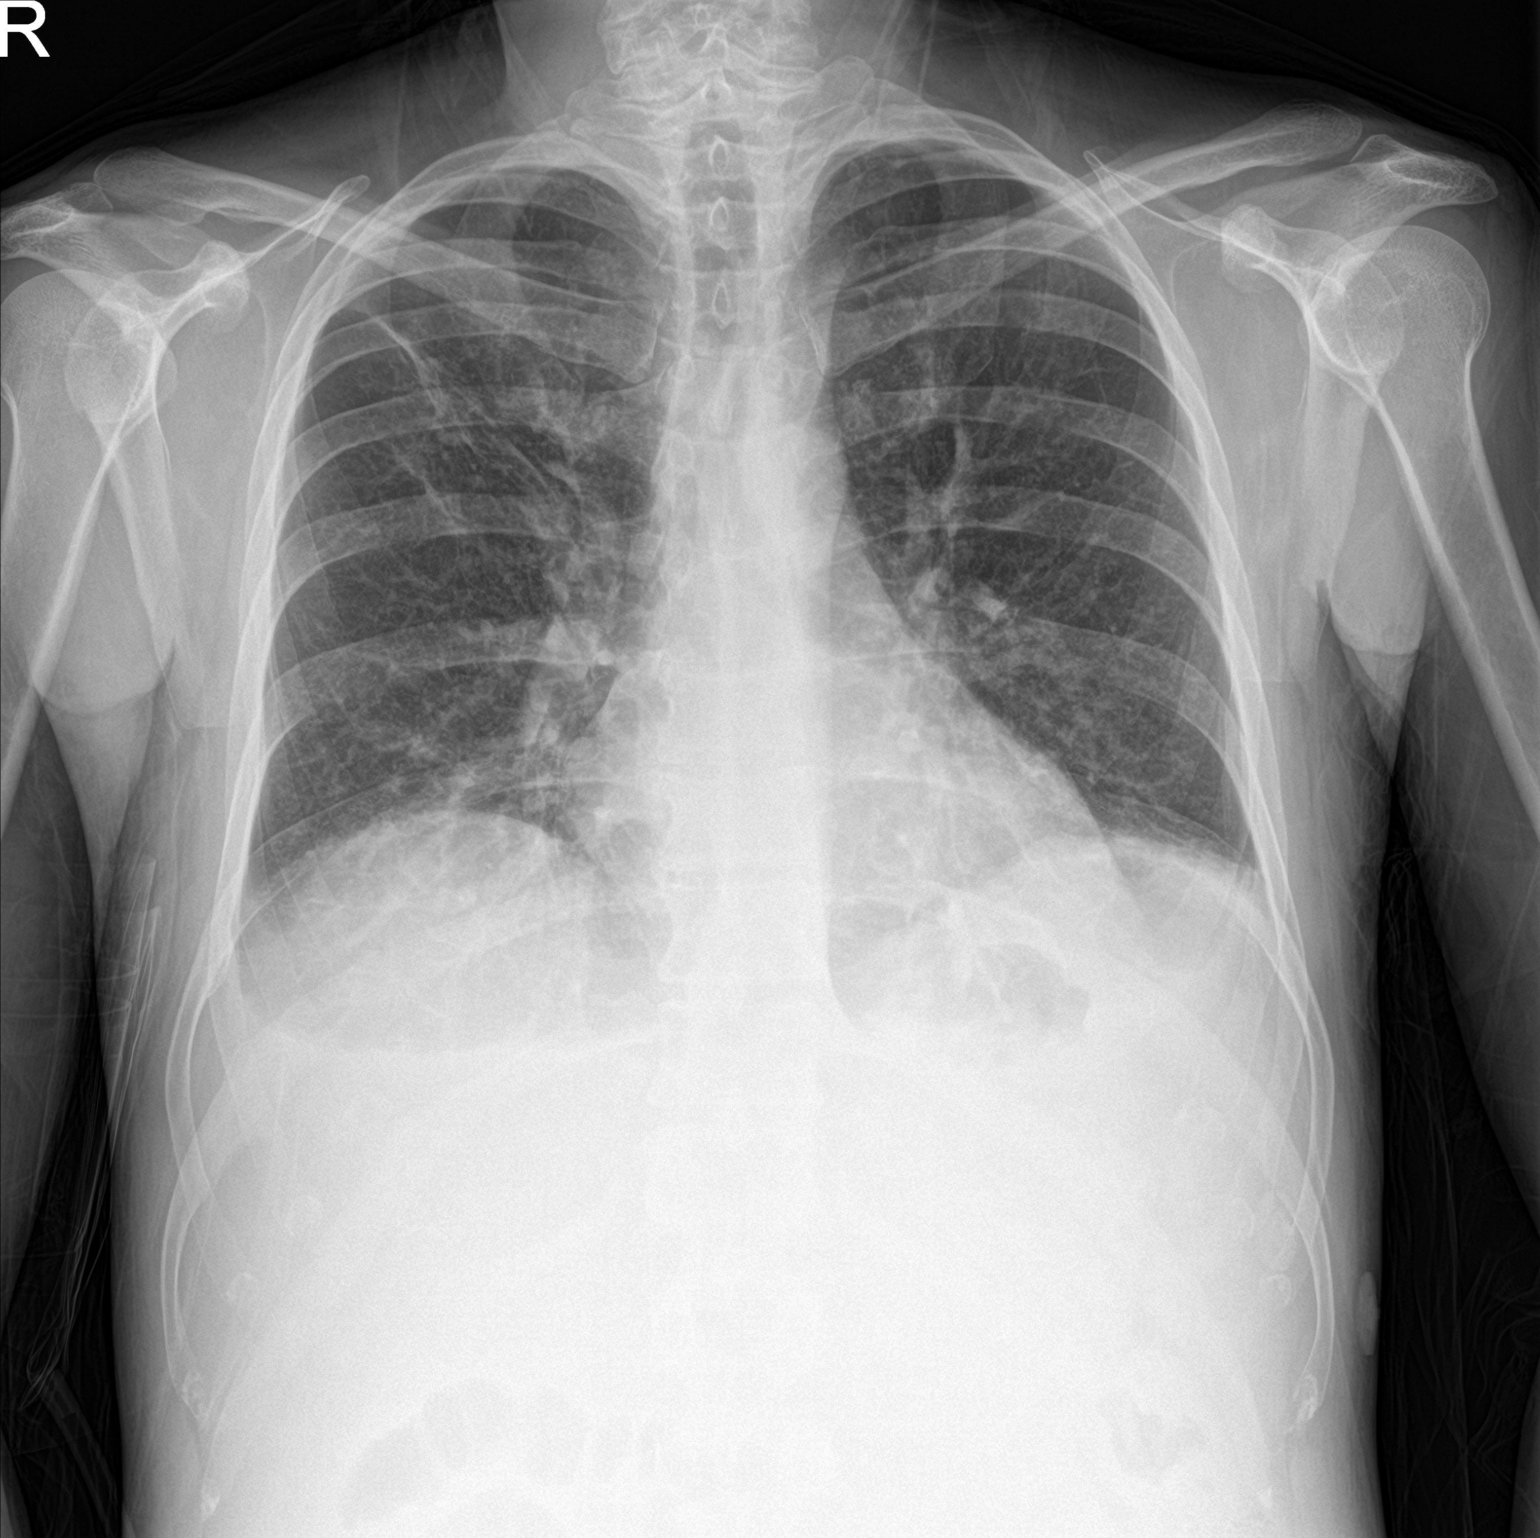

[chest lat]
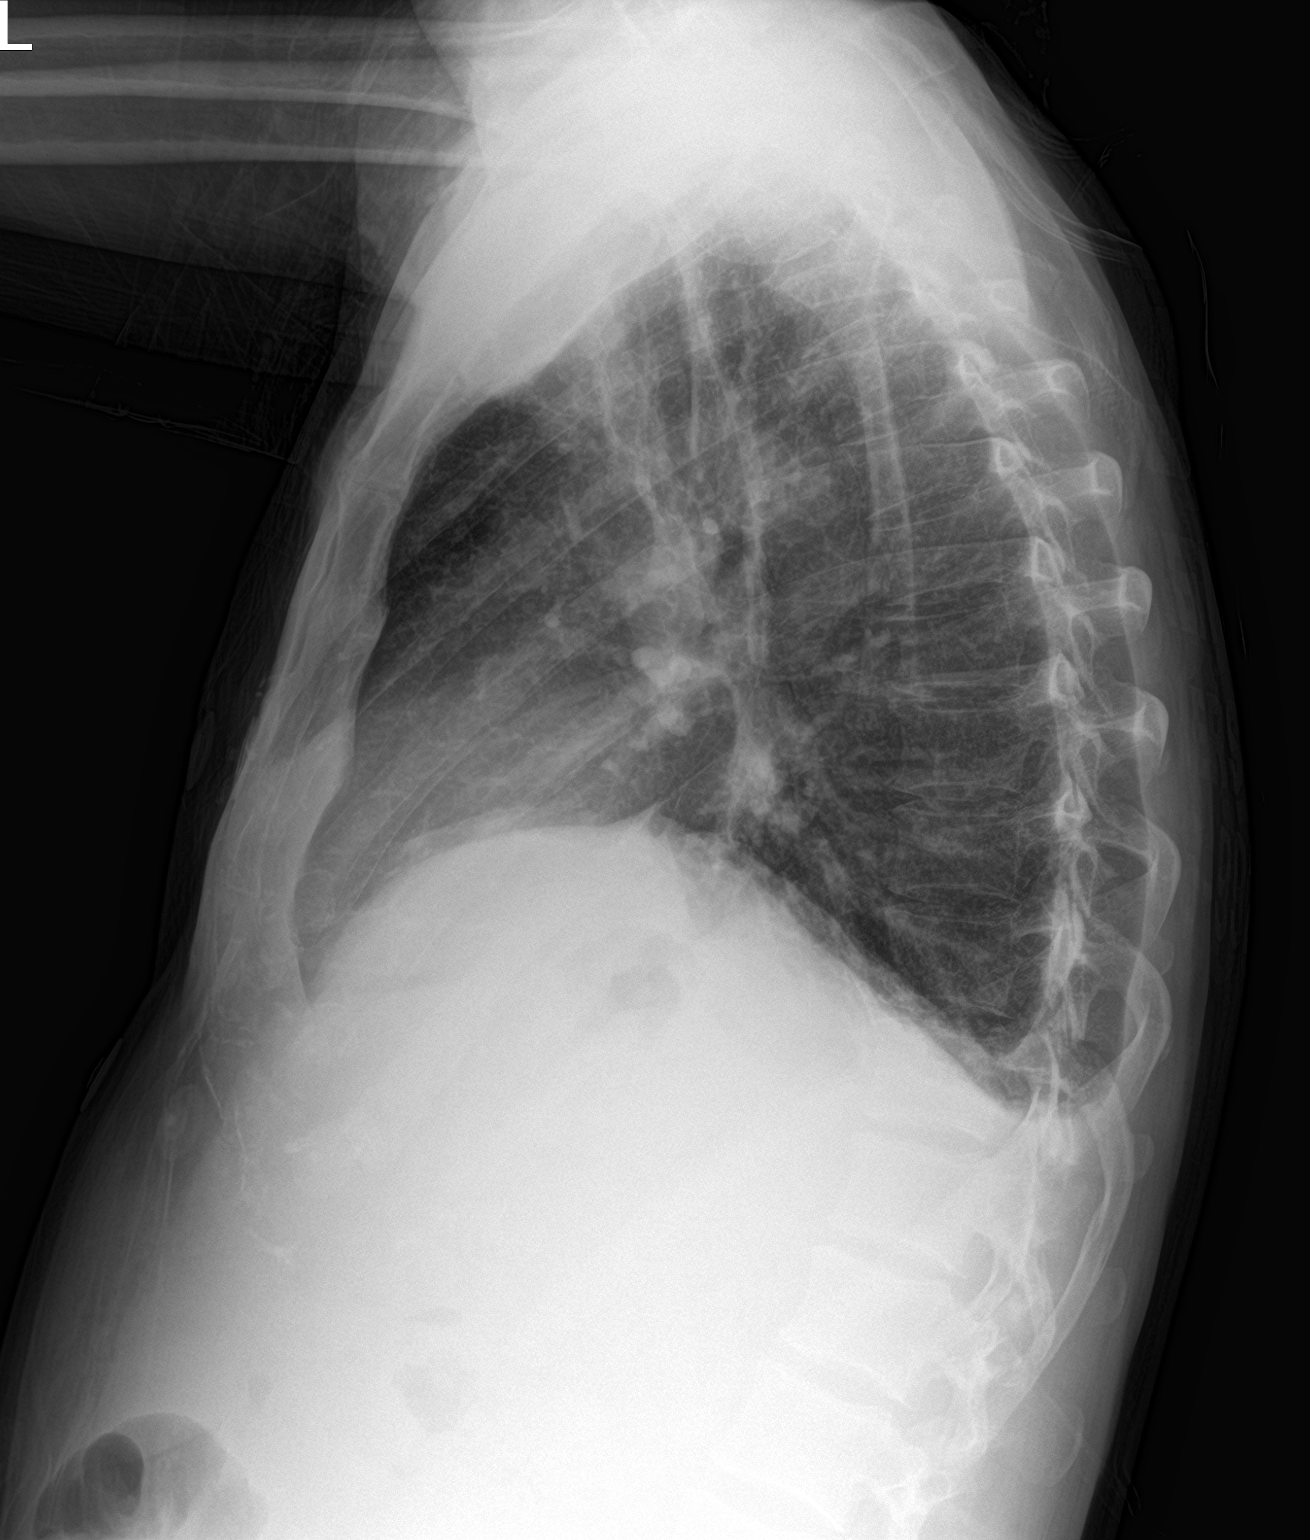

[2 of 2 positions shown; findings below may reference images not displayed]

FINDINGS: Lower lung volumes with small new bilateral pleural effusions. Hazy
opacity in the visible upper abdomen suspicious for ascites. Visible
bowel-gas pattern within normal limits.

Mediastinal contours remain normal. No pneumothorax or pulmonary
edema. Chronic right upper lobe scarring and architectural
distortion is stable. Visualized tracheal air column is within
normal limits. No other acute pulmonary opacity.

No acute osseous abnormality identified.
IMPRESSION: 1. Lower lung volumes with new small pleural effusions.
2. Possible ascites.
3. Right upper lobe lung scarring.

## 2019-11-25 IMAGING — CR DG THORACIC SPINE 2V
3 series · 3 of 3 positions shown · non-contrast
Comparison: [DATE]

CLINICAL DATA: Back pain

EXAM:
THORACIC SPINE 2 VIEWS

[t-spine ap]
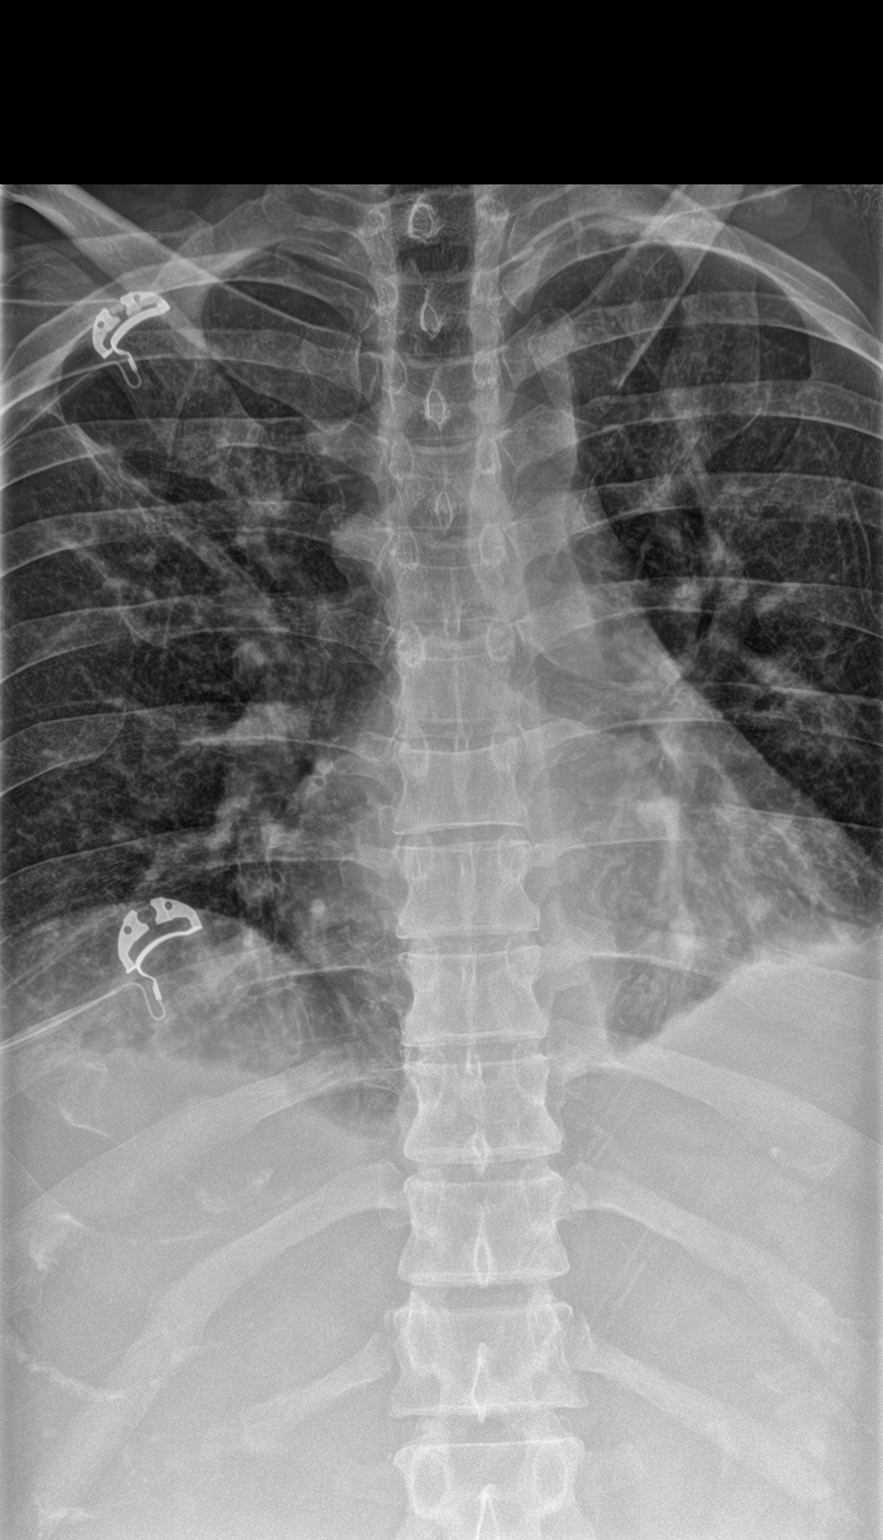

[t-spine lat]
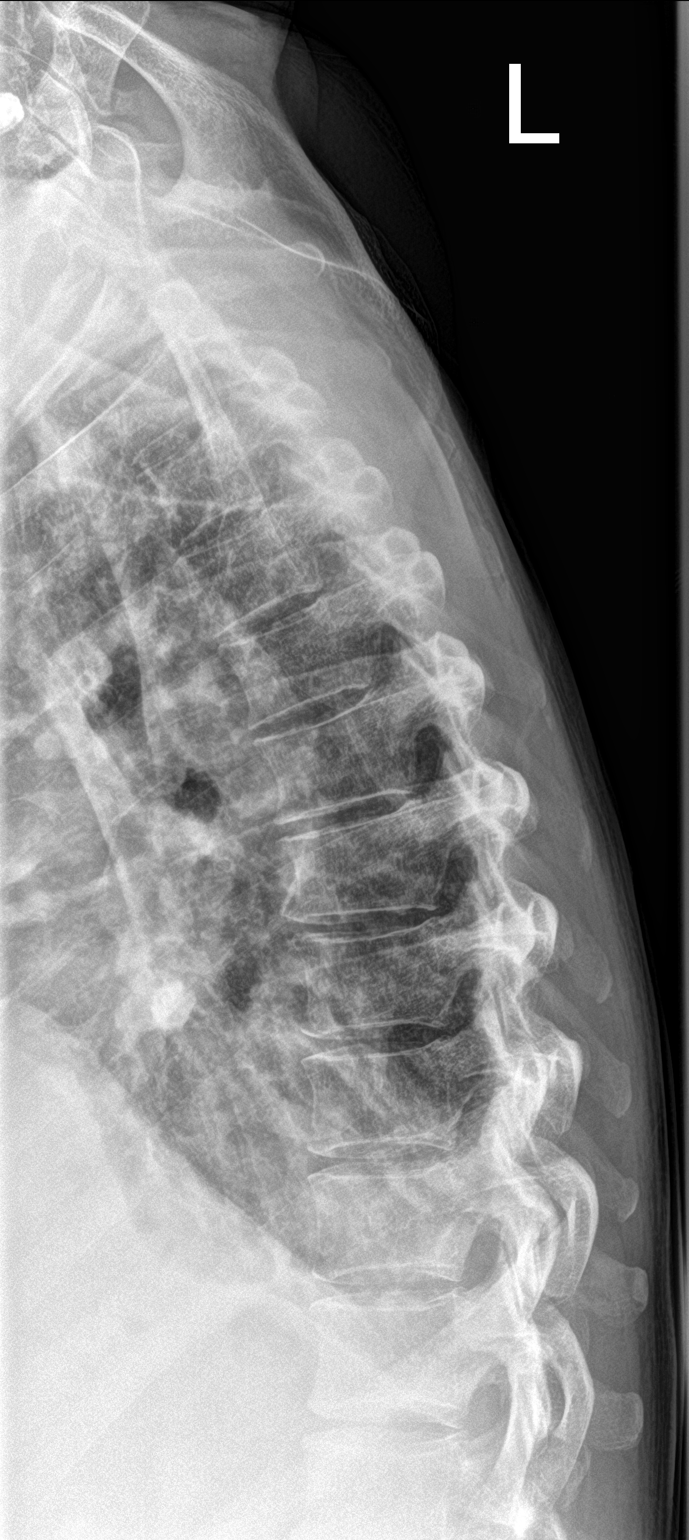

[t-spine swimmers]
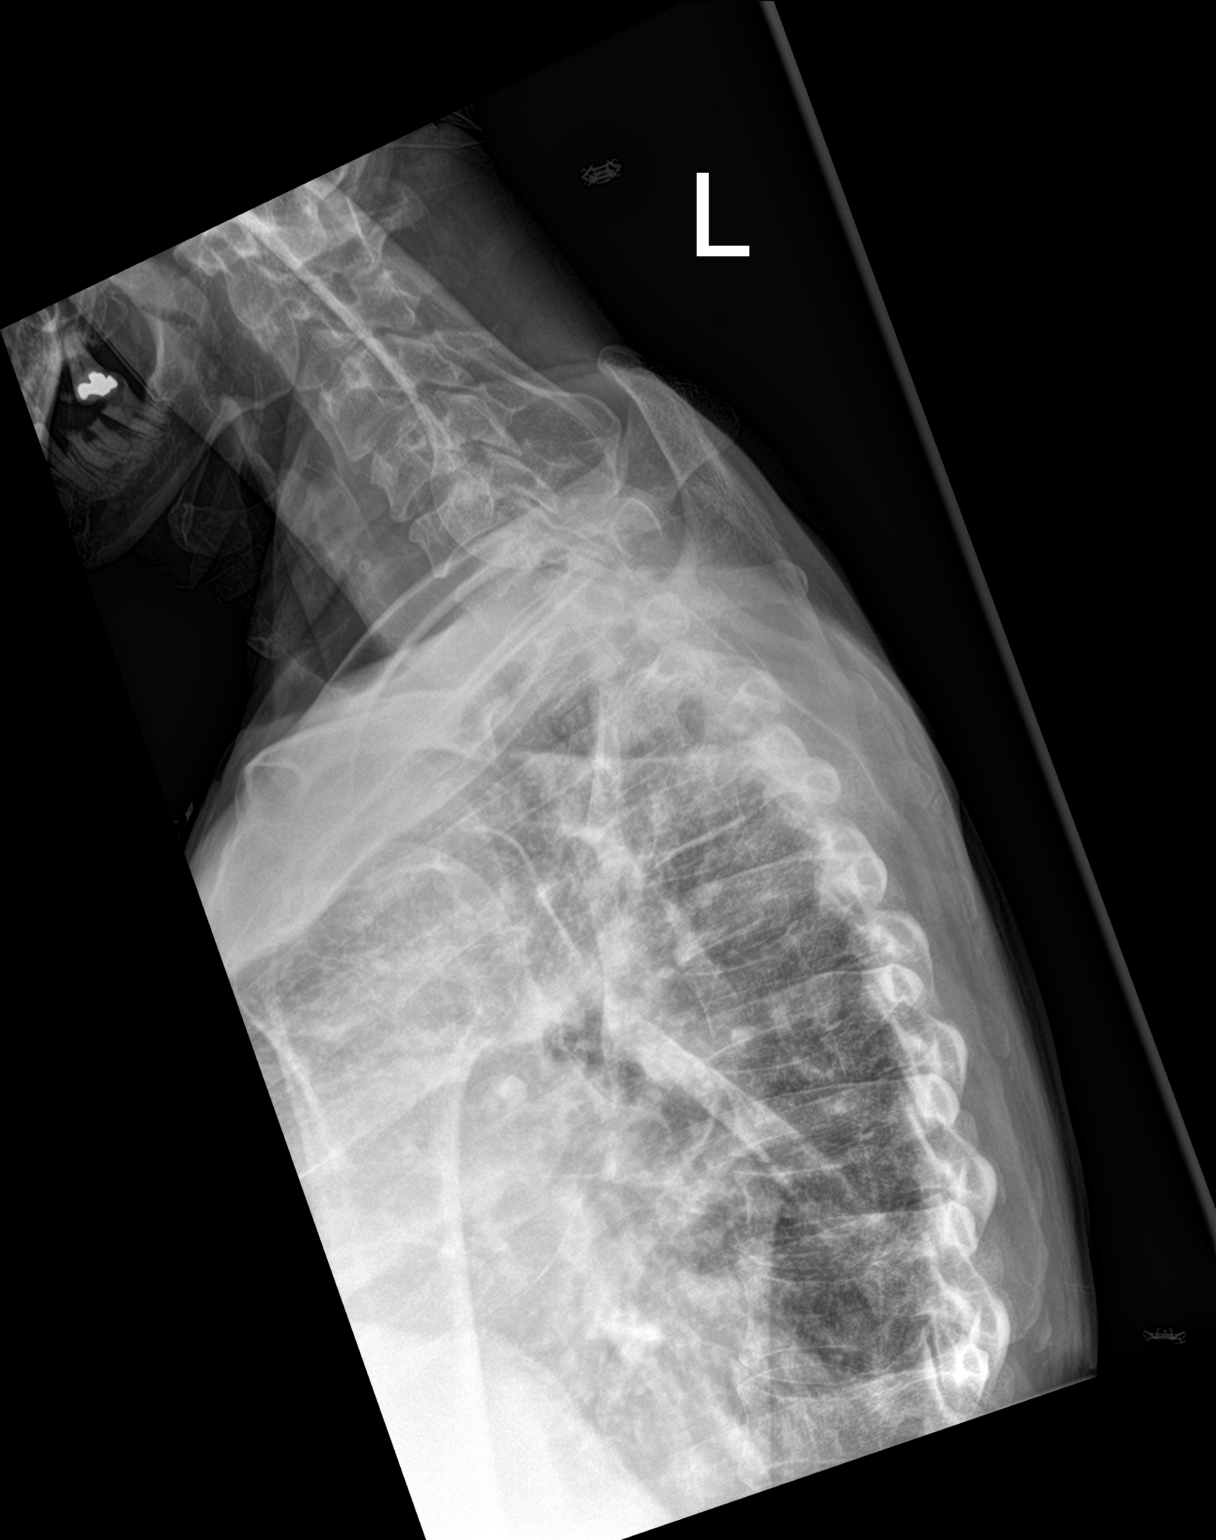

[3 of 3 positions shown; findings below may reference images not displayed]

FINDINGS: There is no evidence of thoracic spine fracture. Alignment is
normal. Intervertebral disc heights are preserved. No other
significant bone abnormalities are identified.
IMPRESSION: Negative.

## 2019-11-25 IMAGING — CR DG LUMBAR SPINE 2-3V
3 series · 3 of 3 positions shown · non-contrast
Comparison: [DATE]

CLINICAL DATA: Back pain

EXAM:
LUMBAR SPINE - 2-3 VIEW

[l-spine ap]
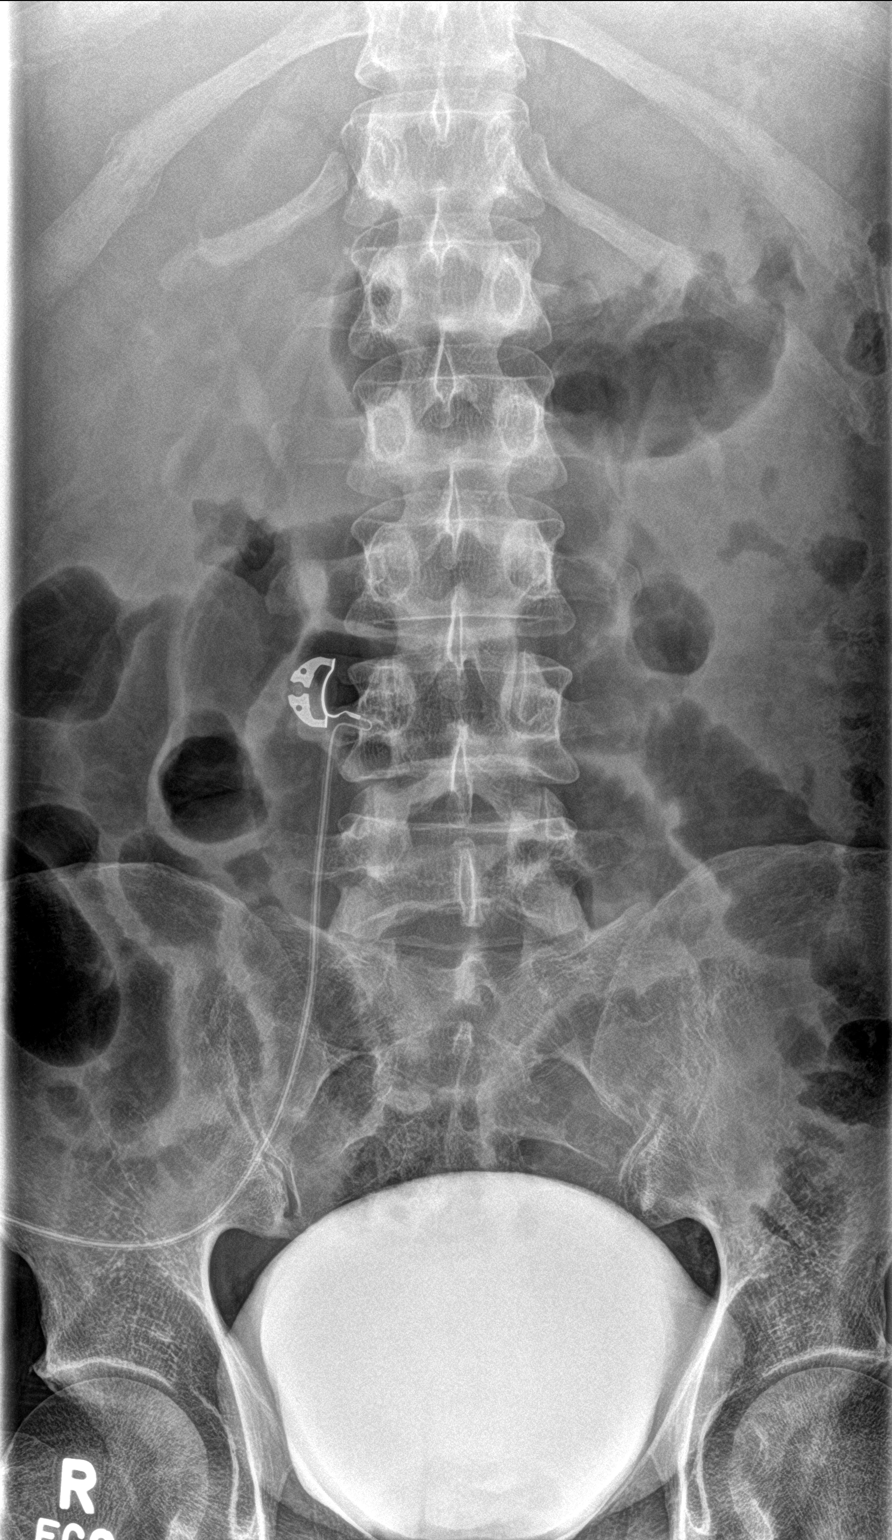

[l-spine lat]
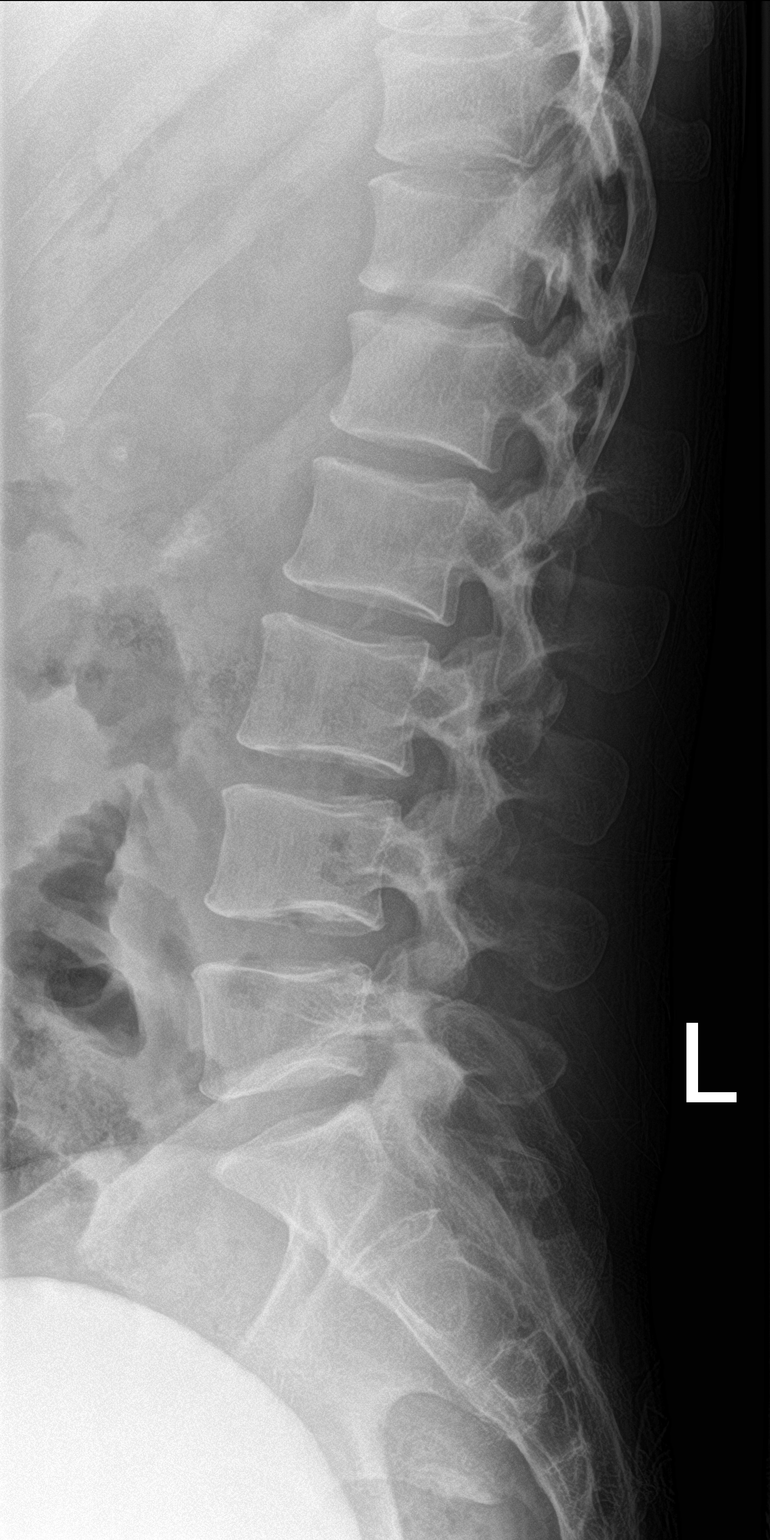

[l-spine spot]
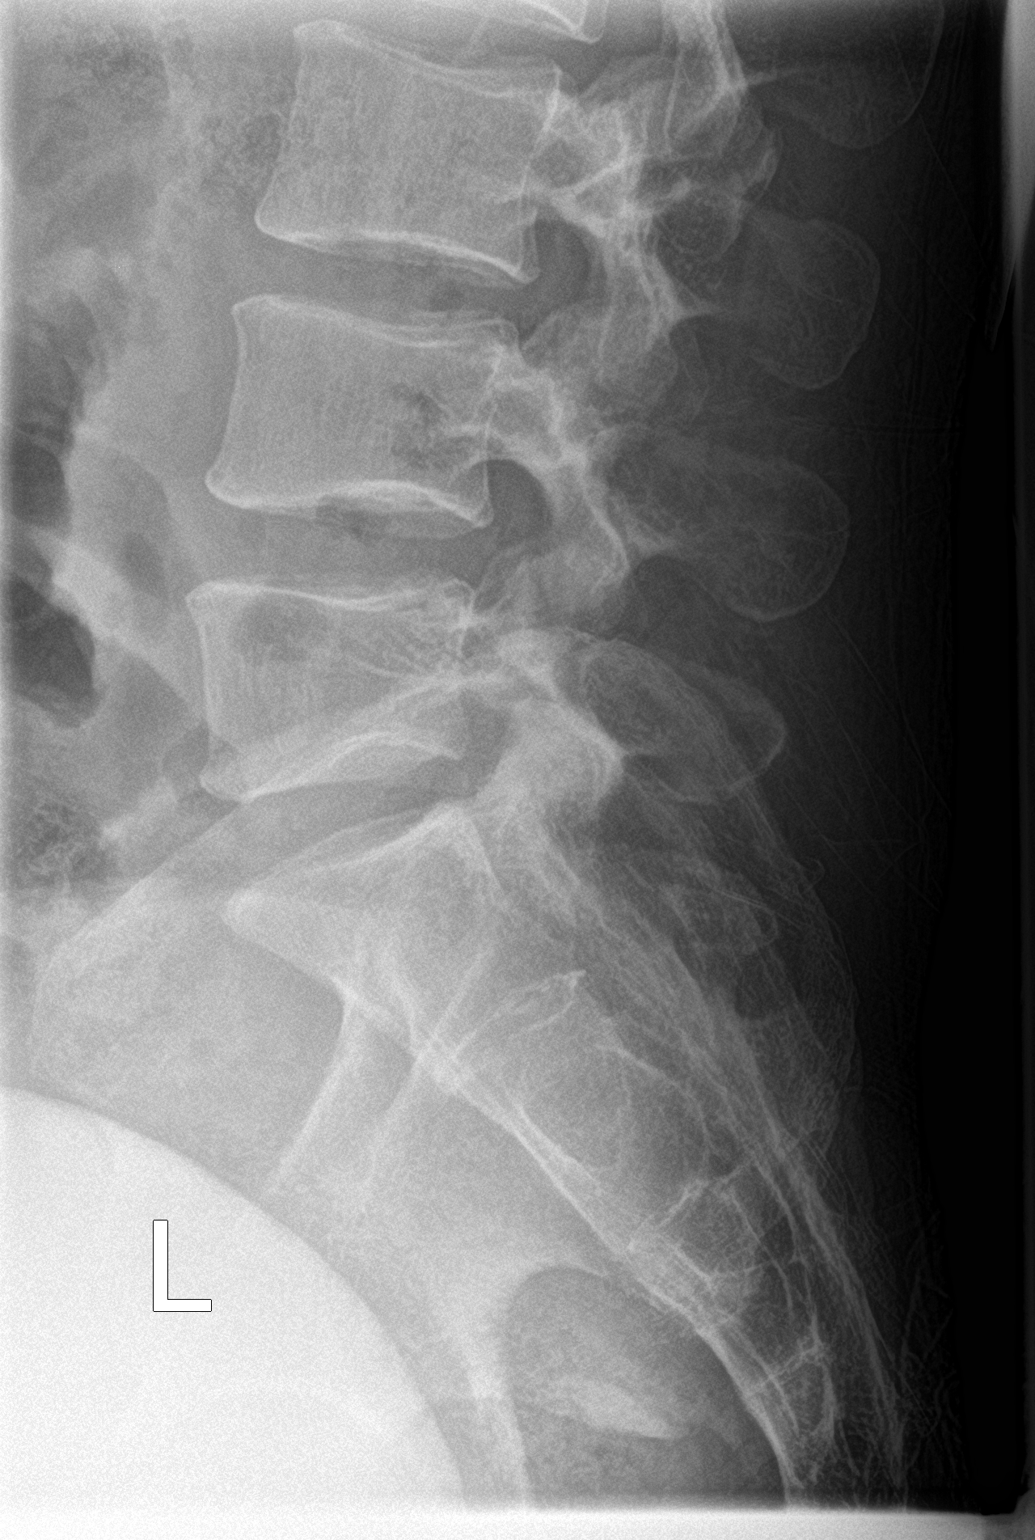

[3 of 3 positions shown; findings below may reference images not displayed]

FINDINGS: There is no evidence of lumbar spine fracture. Alignment is normal.
Intervertebral disc spaces are maintained. No apparent facet joint
arthropathy. Excreted contrast present within the bladder.
IMPRESSION: Negative.

## 2019-11-25 IMAGING — US IR PARACENTESIS
1 series · 6 of 6 positions shown · non-contrast
Comparison: none

INDICATION: Patient with history of alcoholic cirrhosis, jaundice, ascites.
Request received for diagnostic and therapeutic paracentesis.

[Series 1: ir (id) (id)/(id)/(id) ir · 6 of 6 slices shown]
[im 1/6]
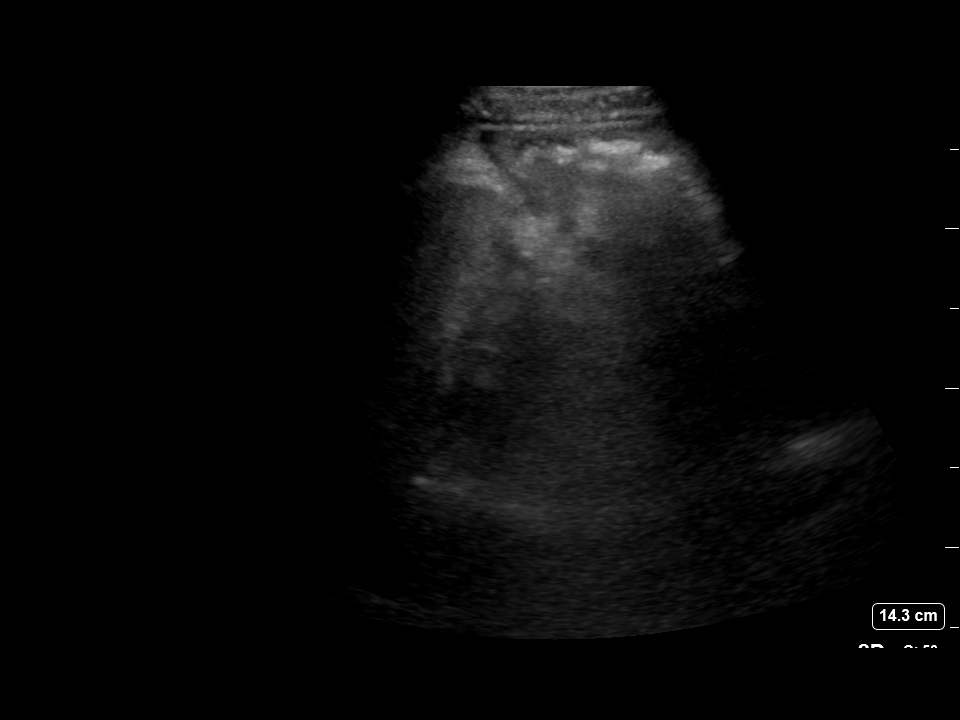
[im 2/6]
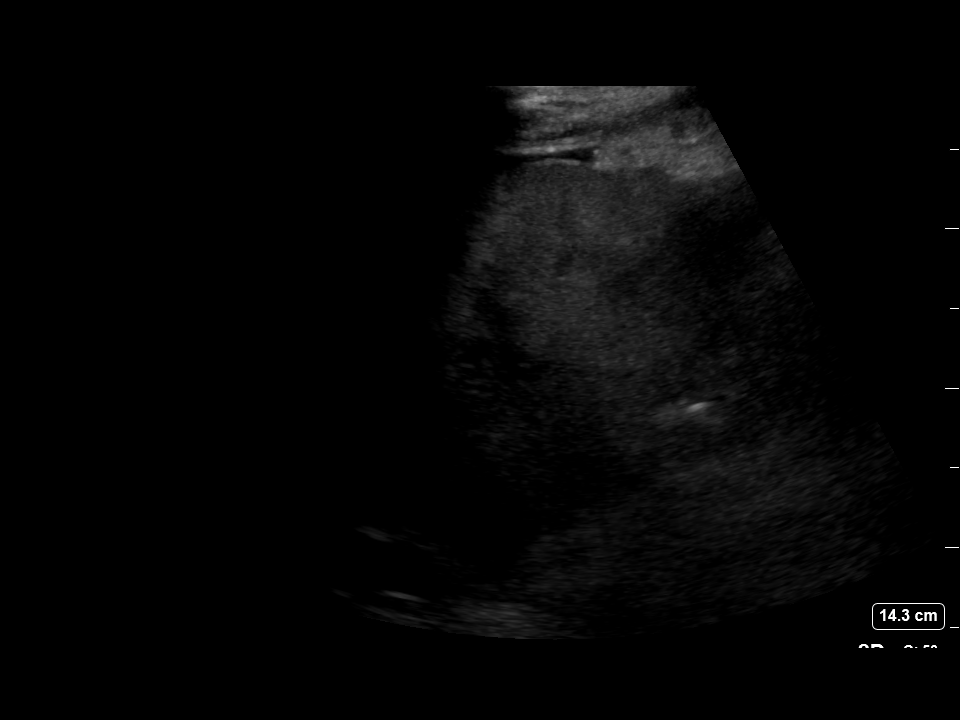
[im 3/6]
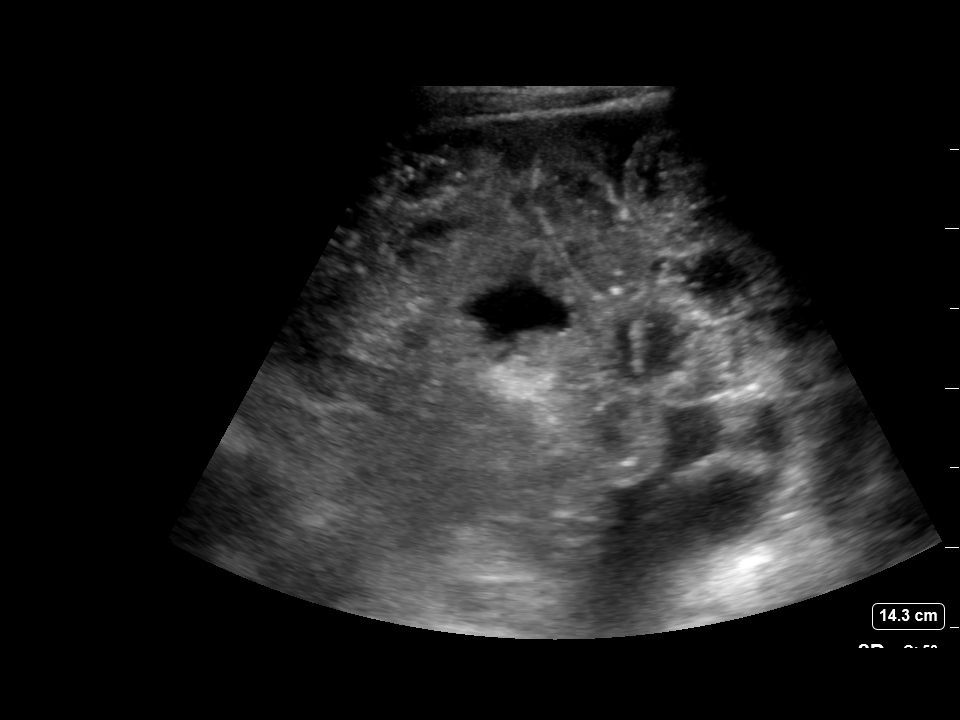
[im 4/6]
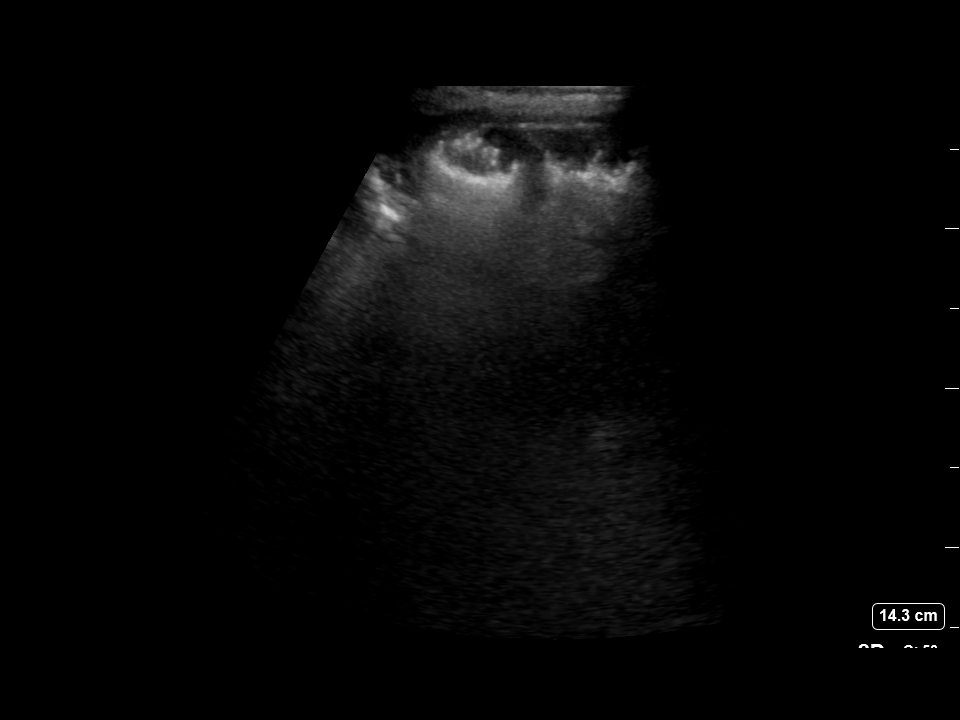
[im 5/6]
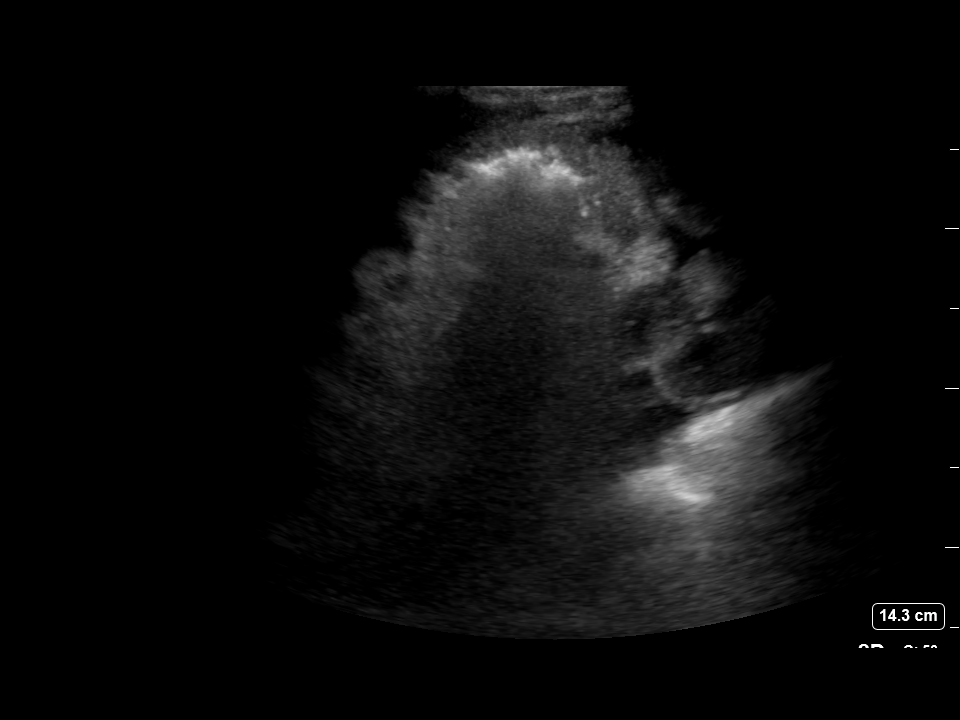
[im 6/6]
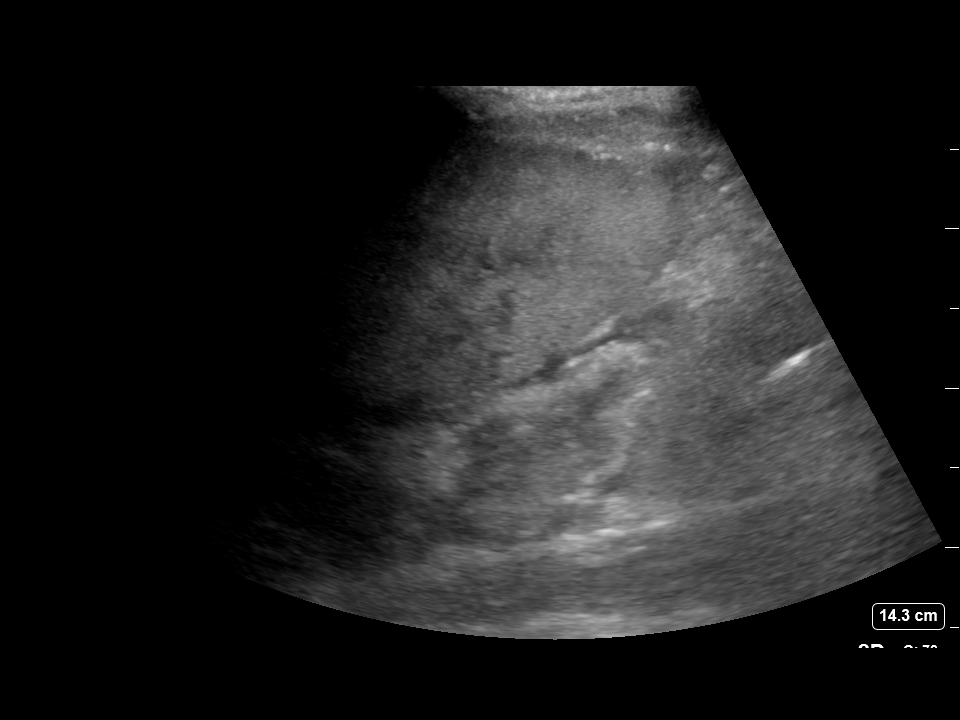

[6 of 6 positions shown; findings below may reference images not displayed]

EXAM:
ULTRASOUND GUIDED DIAGNOSTIC AND THERAPEUTIC PARACENTESIS

MEDICATIONS:
1% lidocaine to skin and subcutaneous tissue

COMPLICATIONS:
None immediate.

PROCEDURE:
Informed written consent was obtained from the patient after a
discussion of the risks, benefits and alternatives to treatment. A
timeout was performed prior to the initiation of the procedure.

Initial ultrasound scanning demonstrates a small amount of ascites
within the right lower abdominal quadrant. The right lower abdomen
was prepped and draped in the usual sterile fashion. 1% lidocaine
was used for local anesthesia.

Following this, a 19 gauge, 7-cm, Yueh catheter was introduced. An
ultrasound image was saved for documentation purposes. The
paracentesis was performed. The catheter was removed and a dressing
was applied. The patient tolerated the procedure well without
immediate post procedural complication.
FINDINGS: A total of approximately 820 cc of clear, golden yellow fluid was
removed. Samples were sent to the laboratory as requested by the
clinical team.
IMPRESSION: Successful ultrasound-guided diagnostic and therapeutic paracentesis
yielding 820 cc of peritoneal fluid.

## 2019-11-25 IMAGING — CT CT ABD-PELV W/ CM
2 of 4 series · 15 of 46 positions shown, 17 images · IV contrast (omnipaque)
Comparison: [DATE]

CLINICAL DATA: Nausea, vomiting, jaundice, abdominal distention.

EXAM:
CT ABDOMEN AND PELVIS WITH CONTRAST
TECHNIQUE: Multidetector CT imaging of the abdomen and pelvis was performed
using the standard protocol following bolus administration of
intravenous contrast.
CONTRAST:  100mL OMNIPAQUE IOHEXOL 300 MG/ML  SOLN

[Series 3: a/p w/ 5mm · axial · 0.66mm/px · z∈[+807,+1262]mm · 12 of 105 slices shown, 14 images]
[im 9/105  soft-tissue]
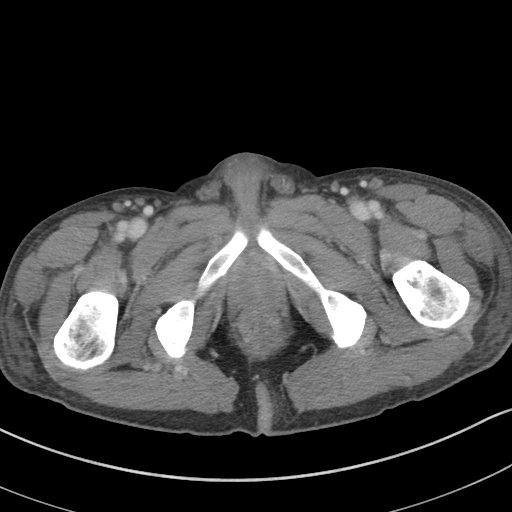
[im 9/105  bone]
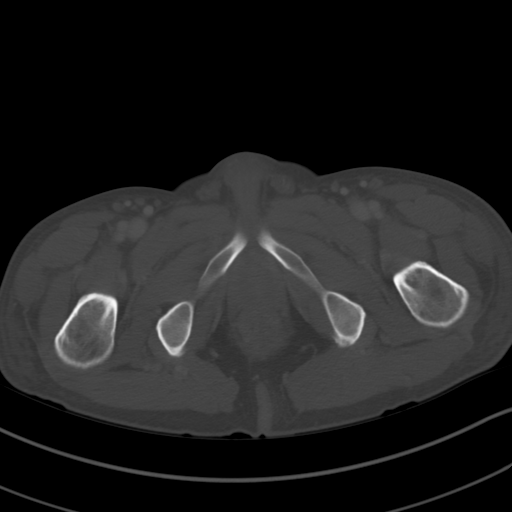
[im 17/105  soft-tissue]
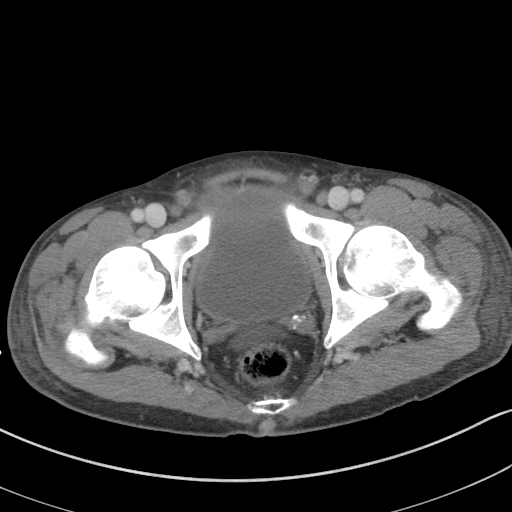
[im 25/105  soft-tissue]
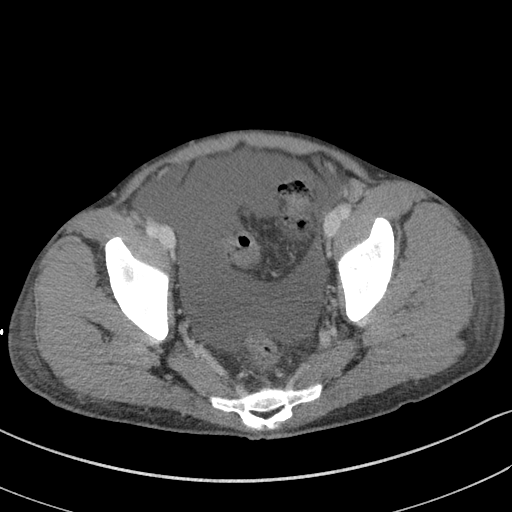
[im 34/105  soft-tissue]
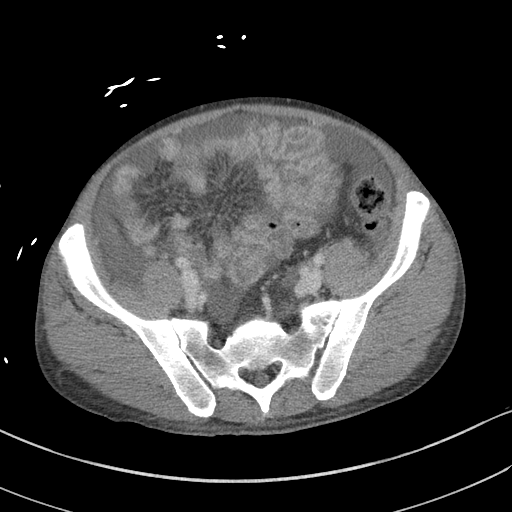
[im 42/105  soft-tissue]
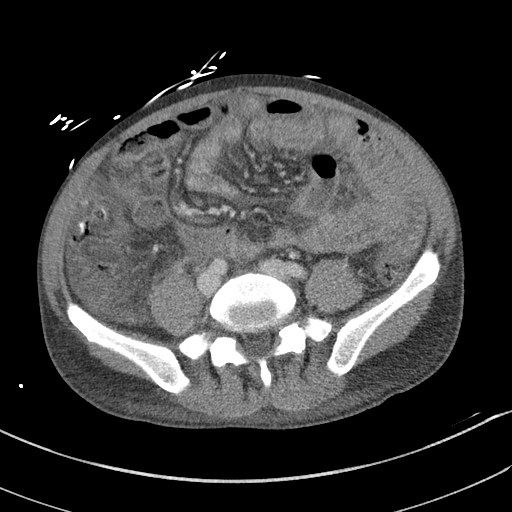
[im 50/105  soft-tissue]
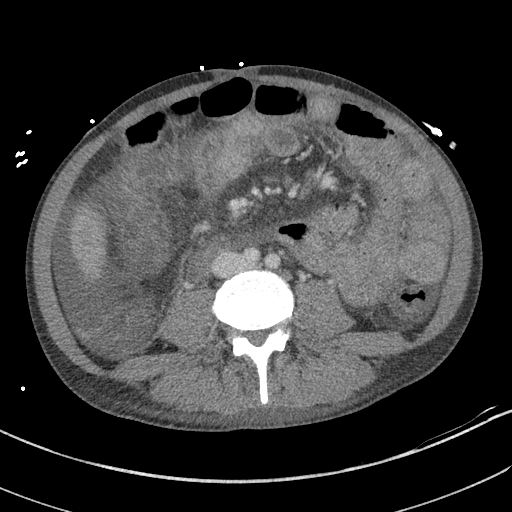
[im 59/105  soft-tissue]
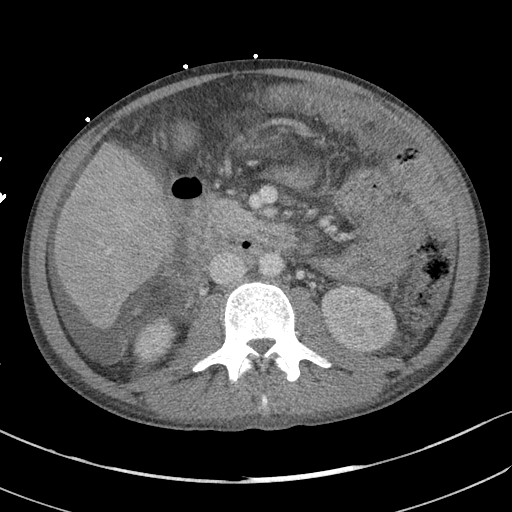
[im 67/105  soft-tissue]
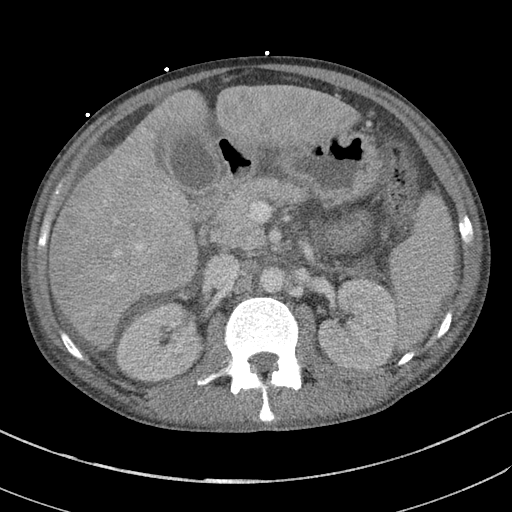
[im 75/105  soft-tissue]
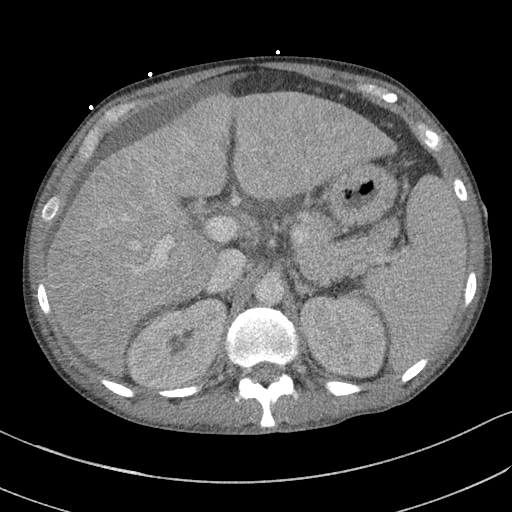
[im 75/105  bone]
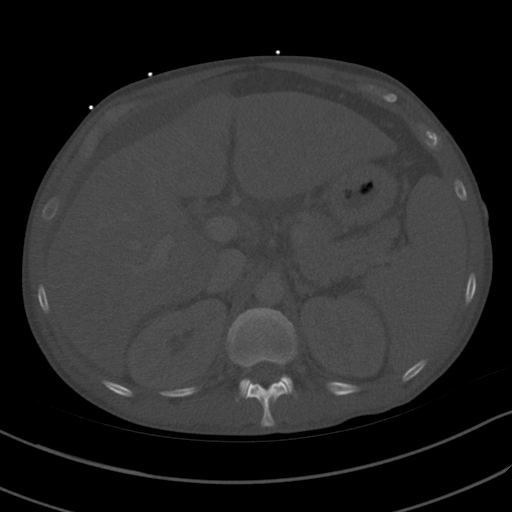
[im 84/105  soft-tissue]
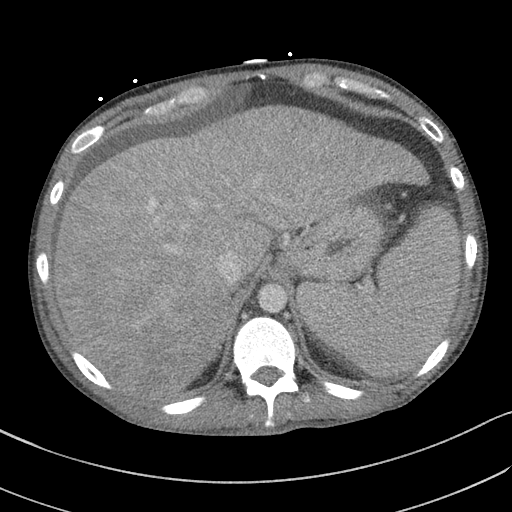
[im 92/105  soft-tissue]
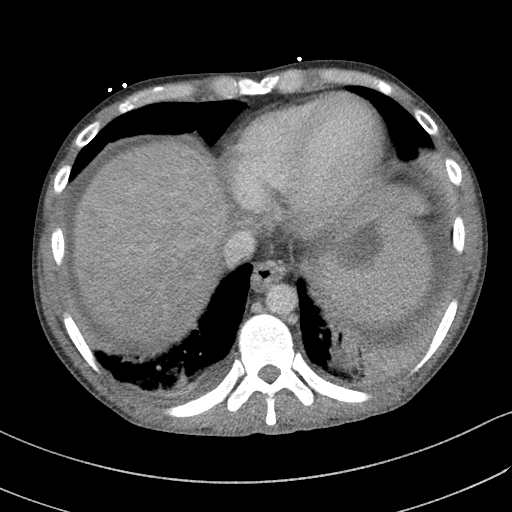
[im 100/105  soft-tissue]
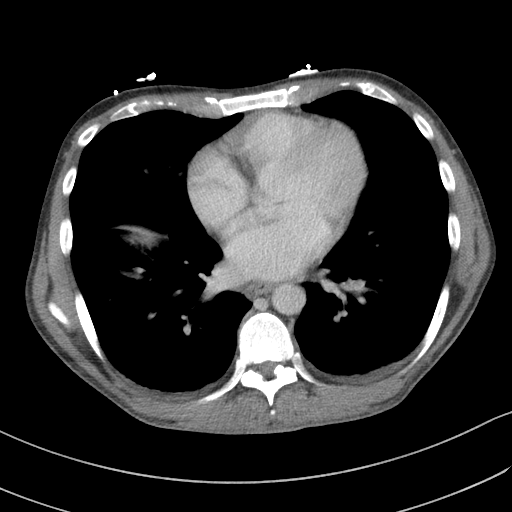

[Series 5: a/p w/ cor · coronal · 0.64mm/px · 3 of 134 slices shown]
[im 45/134  soft-tissue]
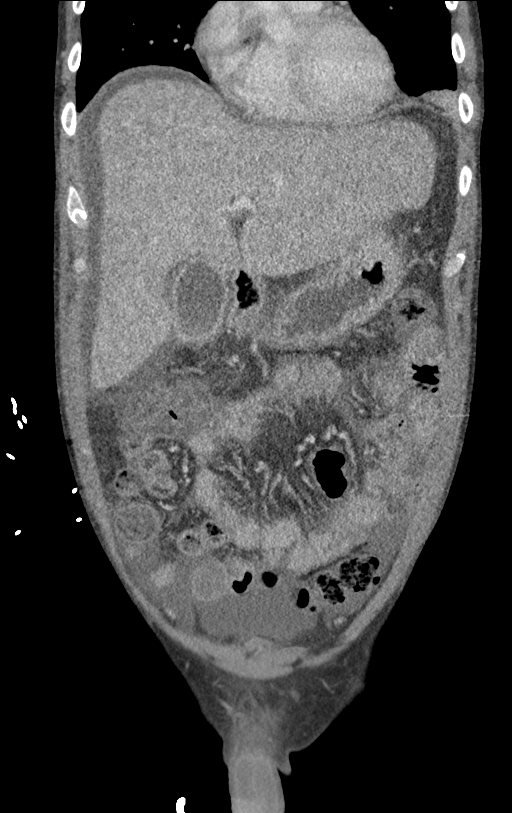
[im 60/134  soft-tissue]
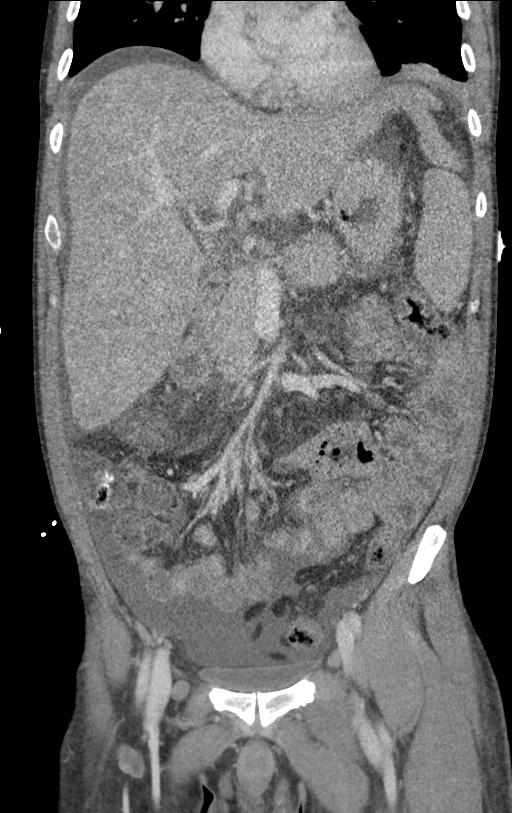
[im 74/134  soft-tissue]
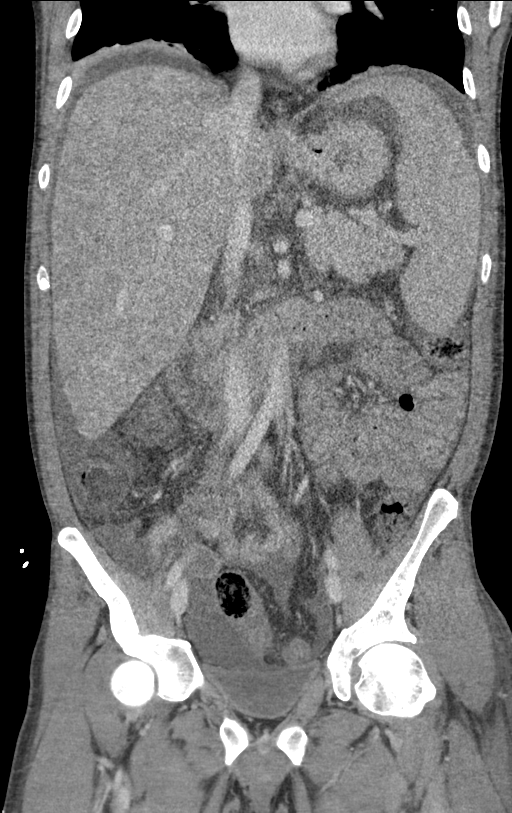

[15 of 46 positions shown; findings below may reference images not displayed]

FINDINGS: Lower chest: Atelectasis in the lung bases. Minimal bilateral
effusions.

Hepatobiliary: Diffuse fatty infiltration of the liver. Enlarged
lateral segment left lobe suggest cirrhosis. Portal veins are
patent. Gallbladder wall is thickened and there is pericholecystic
edema, likely related to liver disease and ascites. No stones
identified. No bile duct dilatation.

Pancreas: Unremarkable. No pancreatic ductal dilatation or
surrounding inflammatory changes.

Spleen: Spleen is enlarged.  No focal lesions.

Adrenals/Urinary Tract: Adrenal glands are unremarkable. Kidneys are
normal, without renal calculi, focal lesion, or hydronephrosis.
Bladder is unremarkable.

Stomach/Bowel: Stomach, small bowel, and colon are not abnormally
distended. There is evidence of diffuse small bowel wall thickening
and colonic wall thickening and edema. These changes may represent
enterocolitis. Cirrhotic colopathy would also be a possible
consideration. If the patient is on ACE inhibitors, this could also
cause bowel wall thickening.

Vascular/Lymphatic: Normal caliber abdominal aorta. No significant
lymphadenopathy. Upper abdominal varices and lower esophageal
varices are present.

Reproductive: Prostate is unremarkable.

Other: Diffuse free fluid throughout the abdomen and pelvis
consistent with ascites.

Musculoskeletal: No acute or significant osseous findings.
IMPRESSION: 1. Diffuse fatty infiltration of the liver.  Probable cirrhosis.
2. Portal venous hypertension with splenic enlargement, upper
abdominal varices, and lower esophageal varices.
3. Diffuse small bowel and colonic wall thickening and edema may
represent enterocolitis. Cirrhotic colopathy would also be a
possible consideration. If the patient is on ACE inhibitors, this
could also cause bowel wall thickening.
4. Diffuse free fluid throughout the abdomen and pelvis consistent
with ascites.
5. Atelectasis in the lung bases with minimal bilateral effusions.

## 2019-11-25 MED ORDER — THIAMINE HCL 100 MG/ML IJ SOLN
100.0000 mg | Freq: Every day | INTRAMUSCULAR | Status: DC
  Administered 2019-11-25 (×2): 100 mg via INTRAVENOUS
  Filled 2019-11-25 (×3): qty 2

## 2019-11-25 MED ORDER — LORAZEPAM 1 MG PO TABS: 0.0000 mg | ORAL_TABLET | Freq: Two times a day (BID) | ORAL | Status: DC

## 2019-11-25 MED ORDER — POTASSIUM CHLORIDE 10 MEQ/100ML IV SOLN
10.0000 meq | INTRAVENOUS | Status: AC
  Administered 2019-11-25 (×3): 10 meq via INTRAVENOUS
  Filled 2019-11-25 (×3): qty 100

## 2019-11-25 MED ORDER — LORAZEPAM 2 MG/ML IJ SOLN
0.0000 mg | Freq: Four times a day (QID) | INTRAMUSCULAR | Status: DC
  Administered 2019-11-25: 2 mg via INTRAVENOUS
  Filled 2019-11-25 (×2): qty 1

## 2019-11-25 MED ORDER — LORAZEPAM 1 MG PO TABS
0.0000 mg | ORAL_TABLET | Freq: Four times a day (QID) | ORAL | Status: DC
  Administered 2019-11-25 (×2): 1 mg via ORAL
  Filled 2019-11-25 (×2): qty 1

## 2019-11-25 MED ORDER — MORPHINE SULFATE (PF) 2 MG/ML IV SOLN
1.0000 mg | INTRAVENOUS | Status: DC | PRN
  Administered 2019-11-25 – 2019-11-27 (×7): 1 mg via INTRAVENOUS
  Filled 2019-11-25 (×7): qty 1

## 2019-11-25 MED ORDER — TRAMADOL HCL 50 MG PO TABS
100.0000 mg | ORAL_TABLET | Freq: Four times a day (QID) | ORAL | Status: DC | PRN
  Administered 2019-11-27 – 2019-12-03 (×6): 100 mg via ORAL
  Filled 2019-11-25 (×6): qty 2

## 2019-11-25 MED ORDER — IOHEXOL 300 MG/ML  SOLN
100.0000 mL | Freq: Once | INTRAMUSCULAR | Status: AC | PRN
  Administered 2019-11-25: 100 mL via INTRAVENOUS

## 2019-11-25 MED ORDER — MORPHINE SULFATE (PF) 2 MG/ML IV SOLN
0.5000 mg | INTRAVENOUS | Status: DC | PRN
Start: 2019-11-25 — End: 2019-11-25

## 2019-11-25 MED ORDER — THIAMINE HCL 100 MG/ML IJ SOLN
Freq: Once | INTRAVENOUS | Status: AC
  Filled 2019-11-25: qty 1000

## 2019-11-25 MED ORDER — SODIUM CHLORIDE 0.9 % IV SOLN
2.0000 g | INTRAVENOUS | Status: DC
  Administered 2019-11-25 – 2019-11-26 (×2): 2 g via INTRAVENOUS
  Filled 2019-11-25 (×3): qty 20

## 2019-11-25 MED ORDER — MORPHINE SULFATE (PF) 2 MG/ML IV SOLN
0.5000 mg | INTRAVENOUS | Status: DC | PRN
  Administered 2019-11-25: 1 mg via INTRAVENOUS
  Filled 2019-11-25: qty 1

## 2019-11-25 MED ORDER — LIDOCAINE HCL (PF) 1 % IJ SOLN
INTRAMUSCULAR | Status: AC | PRN
  Administered 2019-11-25: 10 mL

## 2019-11-25 MED ORDER — POTASSIUM & SODIUM PHOSPHATES 280-160-250 MG PO PACK
1.0000 | PACK | Freq: Three times a day (TID) | ORAL | Status: DC
  Administered 2019-11-25 – 2019-12-01 (×24): 1 via ORAL
  Filled 2019-11-25 (×27): qty 1

## 2019-11-25 MED ORDER — TRAMADOL HCL 50 MG PO TABS
50.0000 mg | ORAL_TABLET | Freq: Four times a day (QID) | ORAL | Status: DC | PRN
  Administered 2019-11-25: 50 mg via ORAL
  Filled 2019-11-25: qty 1

## 2019-11-25 MED ORDER — LORAZEPAM 2 MG/ML IJ SOLN: 0.0000 mg | Freq: Two times a day (BID) | INTRAMUSCULAR | Status: DC

## 2019-11-25 MED ORDER — POTASSIUM CHLORIDE 10 MEQ/100ML IV SOLN
10.0000 meq | Freq: Once | INTRAVENOUS | Status: AC
  Administered 2019-11-25: 10 meq via INTRAVENOUS
  Filled 2019-11-25: qty 100

## 2019-11-25 MED ORDER — LACTULOSE 10 GM/15ML PO SOLN
20.0000 g | Freq: Two times a day (BID) | ORAL | Status: DC
  Administered 2019-11-25 – 2019-11-28 (×7): 20 g via ORAL
  Filled 2019-11-25 (×9): qty 30

## 2019-11-25 MED ORDER — THIAMINE HCL 100 MG PO TABS
100.0000 mg | ORAL_TABLET | Freq: Every day | ORAL | Status: DC
  Administered 2019-11-26 – 2019-12-04 (×9): 100 mg via ORAL
  Filled 2019-11-25 (×11): qty 1

## 2019-11-25 NOTE — Consult Note (Addendum)
Referring Provider:  [facility]         Primary Care Physician:  Monica Bectonhekkekandam, Thomas J, MD Primary Gastroenterologist:    unassigned         We were asked to see this patient for:     cirrhosis             ASSESSMENT / PLAN:   # Decompensated cirrhosis with superimposed Etoh hepatitis.  --Marked hyperbilirubinemia. CT scan suggesting cirrhosis. There are findings of portal HTN with splenomegaly, esophageal varices, and ascites.  --Platelets 115, INR 2.0 --Markedly hypoalbuminemia, 1.4.  --Renal function okay.  --Diagnostic paracentesis with fluid studies including gram stain, cell count, culture,  albumin, cytology --Empiric Rocephin has already been started.  --Mentation okay, has mild asterixis on exam. Will start lactulose.  --Check viral hep B and hep C serologies,  last checked in 2019. Holding off on checking for other etiologies of chronic liver disease for ( autoimmune, genetic, etc)  --Maddrey Score > 32 but please refrain from steroids until SBP is ruled out. --At some point will need EGD to address esophageal varices seen on CT scan  --Obviously he will need to stop Etoh use going forward   # Leukocytosis --Possibly from Etoh hepatitis. With abdominal pain need to rule out SBP.   # Macrocytic anemia.  --Baseline hgb 13-14, down in 8 range now. Probably multifactorial ( nutritional deficits,  cirrhosis, bone marrow suppression).   # Diffuse small bowel wall and colon wall thickening on CT scan. Also gallbladder wall thickened with pericholecystic fluid. In setting of ascites these finding are all possibly related to hypoalbuminemia / portal HTN.   # Etoh abuse --On CIWA protocol  # Vitamin D deficiency  # Tobacco abuse     HPI:                                                                                                                             Chief Complaint: abdominal pain and swelling.   Zachary Mata is a 39 y.o. male with a pmh significant  for, not necessarily limited to: Etoh abuse, HTN, COPD  Patient seen in ED around midnight with abdominal pain and distention, nausea, vomiting, and jaundice. He has a hx of Etoh abuse He has marked hyperbilirubinemia. CT scan w/ contrast suggesting cirrhosis. Findings of portal HTN.   Patient began having swelling in abdomen a couple of weeks ago. About the same time he developed nausea , vomiting and frequent stools. Some stools loose, others not. Emesis contains food. No hematemesis. No black stools or blood in stools. He started having stabbing mid to upper abdominal pain a few days ago. Patient denies tylenol use. Takes NSAIDS sometimes for back pain. No recent antibiotics. No herbs / supplements. No IV drug use. He gives a history of liver disease in Aunt ( deceased) and in father but says they both drank.    PREVIOUS ENDOSCOPIC EVALUATIONS / PERTINENT STUDIES  none  Past Medical History:  Diagnosis Date  . Chronic back pain 03/02/2012  . Essential hypertension 03/02/2012  . Hypertension     Past Surgical History:  Procedure Laterality Date  . HAND SURGERY    . WRIST SURGERY      Prior to Admission medications   Medication Sig Start Date End Date Taking? Authorizing Provider  azelastine (OPTIVAR) 0.05 % ophthalmic solution Place 2 drops into both eyes 2 (two) times daily. Patient not taking: Reported on 11/19/2019 04/09/19   Monica Becton, MD  busPIRone (BUSPAR) 15 MG tablet 1 tablet p.o. twice daily to 3 times daily Patient not taking: Reported on 11/19/2019 04/09/19   Monica Becton, MD  fluticasone [facility]) 50 MCG/ACT nasal spray One spray in each nostril twice a day Patient not taking: Reported on 11/19/2019 04/09/19   Monica Becton, MD  lisinopril (ZESTRIL) 20 MG tablet Take 1 tablet (20 mg total) by mouth daily. Patient not taking: Reported on 11/19/2019 04/09/19 04/08/20  Monica Becton, MD  tadalafil (CIALIS) 5 MG tablet Take 1 tablet (5 mg  total) by mouth daily as needed for erectile dysfunction. Patient not taking: Reported on 11/19/2019 02/26/19   Monica Becton, MD  umeclidinium-vilanterol [facility] ELLIPTA) 62.5-25 MCG/INH AEPB Inhale 1 puff into the lungs daily. Patient not taking: Reported on 11/19/2019 03/12/19   Monica Becton, MD  Vitamin D, Ergocalciferol, (DRISDOL) 1.25 MG (50000 UNIT) CAPS capsule Take 1 capsule (50,000 Units total) by mouth every 7 (seven) days. Take for 8 total doses(weeks) Patient not taking: Reported on 11/19/2019 03/03/19   Monica Becton, MD    Current Facility-Administered Medications  Medication Dose Route Frequency Provider Last Rate Last Admin  . cefTRIAXone (ROCEPHIN) 2 g in sodium chloride 0.9 % 100 mL IVPB  2 g Intravenous Q24H Cox, Amy N, DO   Stopped at 11/25/19 0654  . LORazepam (ATIVAN) injection 0-4 mg  0-4 mg Intravenous Q6H Cox, Amy N, DO       Or  . LORazepam (ATIVAN) tablet 0-4 mg  0-4 mg Oral Q6H Cox, Amy N, DO   1 mg at 11/25/19 1000  . [START ON 11/27/2019] LORazepam (ATIVAN) injection 0-4 mg  0-4 mg Intravenous Q12H Cox, Amy N, DO       Or  . [START ON 11/27/2019] LORazepam (ATIVAN) tablet 0-4 mg  0-4 mg Oral Q12H Cox, Amy N, DO      . morphine 2 MG/ML injection 0.5-1 mg  0.5-1 mg Intravenous Q4H PRN Kathlen Mody, MD      . potassium & sodium phosphates (PHOS-NAK) 280-160-250 MG packet 1 packet  1 packet Oral TID WC & HS Cox, Amy N, DO   1 packet at 11/25/19 0740  . thiamine tablet 100 mg  100 mg Oral Daily Cox, Amy N, DO       Or  . thiamine (B-1) injection 100 mg  100 mg Intravenous Daily Cox, Amy N, DO   100 mg at 11/25/19 0300  . traMADol (ULTRAM) tablet 50 mg  50 mg Oral Q6H PRN Kathlen Mody, MD   50 mg at 11/25/19 9233   Current Outpatient Medications  Medication Sig Dispense Refill  . azelastine (OPTIVAR) 0.05 % ophthalmic solution Place 2 drops into both eyes 2 (two) times daily. (Patient not taking: Reported on 11/19/2019) 6 mL 11  . busPIRone  (BUSPAR) 15 MG tablet 1 tablet p.o. twice daily to 3 times daily (Patient not taking: Reported on 11/19/2019) 90  tablet 3  . fluticasone (FLONASE) 50 MCG/ACT nasal spray One spray in each nostril twice a day (Patient not taking: Reported on 11/19/2019) 48 g 3  . lisinopril (ZESTRIL) 20 MG tablet Take 1 tablet (20 mg total) by mouth daily. (Patient not taking: Reported on 11/19/2019) 30 tablet 3  . tadalafil (CIALIS) 5 MG tablet Take 1 tablet (5 mg total) by mouth daily as needed for erectile dysfunction. (Patient not taking: Reported on 11/19/2019) 30 tablet 3  . umeclidinium-vilanterol (ANORO ELLIPTA) 62.5-25 MCG/INH AEPB Inhale 1 puff into the lungs daily. (Patient not taking: Reported on 11/19/2019) 60 each 11  . Vitamin D, Ergocalciferol, (DRISDOL) 1.25 MG (50000 UNIT) CAPS capsule Take 1 capsule (50,000 Units total) by mouth every 7 (seven) days. Take for 8 total doses(weeks) (Patient not taking: Reported on 11/19/2019) 8 capsule 0    Allergies as of 11/25/2019 - Review Complete 11/25/2019  Allergen Reaction Noted  . Sulfa antibiotics Swelling 04/28/2011    Family History  Problem Relation Age of Onset  . Cancer Mother        breats CA  . Diabetes Mother   . Breast cancer Other     Social History   Socioeconomic History  . Marital status: Married    Spouse name: Not on file  . Number of children: Not on file  . Years of education: Not on file  . Highest education level: Not on file  Occupational History  . Not on file  Tobacco Use  . Smoking status: Current Every Day Smoker    Packs/day: 2.00    Years: 15.00    Pack years: 30.00    Types: Cigarettes  . Smokeless tobacco: Never Used  Vaping Use  . Vaping Use: Never used  Substance and Sexual Activity  . Alcohol use: Yes    Alcohol/week: 6.0 - 7.0 standard drinks    Types: 6 - 7 Cans of beer per week  . Drug use: No  . Sexual activity: Yes  Other Topics Concern  . Not on file  Social History Narrative  . Not on  file   Social Determinants of Health   Financial Resource Strain:   . Difficulty of Paying Living Expenses: Not on file  Food Insecurity:   . Worried About Programme researcher, broadcasting/film/video in the Last Year: Not on file  . Ran Out of Food in the Last Year: Not on file  Transportation Needs:   . Lack of Transportation (Medical): Not on file  . Lack of Transportation (Non-Medical): Not on file  Physical Activity:   . Days of Exercise per Week: Not on file  . Minutes of Exercise per Session: Not on file  Stress:   . Feeling of Stress : Not on file  Social Connections:   . Frequency of Communication with Friends and Family: Not on file  . Frequency of Social Gatherings with Friends and Family: Not on file  . Attends Religious Services: Not on file  . Active Member of Clubs or Organizations: Not on file  . Attends Banker Meetings: Not on file  . Marital Status: Not on file  Intimate Partner Violence:   . Fear of Current or Ex-Partner: Not on file  . Emotionally Abused: Not on file  . Physically Abused: Not on file  . Sexually Abused: Not on file    Review of Systems: All systems reviewed and negative except where noted in HPI.    OBJECTIVE:    Physical Exam: Vital  signs in last 24 hours: Temp:  [97.7 F (36.5 C)-98.7 F (37.1 C)] 98.7 F (37.1 C) (10/21 0500) Pulse Rate:  [64-102] 102 (10/21 0954) Resp:  [14-21] 16 (10/21 0900) BP: (117-177)/(64-85) 138/84 (10/21 0954) SpO2:  [93 %-98 %] 95 % (10/21 0900)   General:   Alert, thin male in NAD Psych:  Pleasant, cooperative. Normal mood and affect. Eyes:  Scleral icterus.  Ears:  Normal auditory acuity. Nose:  No deformity, discharge,  or lesions. Neck:  Supple; no masses Lungs:  Clear throughout to auscultation.   No wheezes, crackles, or rhonchi.  Heart:  Regular rate and rhythm; no murmurs, 2+ BLE edema.  Abdomen:  Soft, moderately distended, mild diffuse tenderness. BS active, no palp mass   Rectal:  Deferred    Msk:  Symmetrical without gross deformities. . Neurologic:  Alert and  oriented x4;  grossly normal neurologically. Mildly tremulous without asterixis.  He does not seem to feel like talking much, but he is attentive and speech fluent.  His wife is present at the bedside for the entire visit. Skin:  Intact without significant lesions or rashes. Multiple tatoos Deeply jaundiced with multiple chest wall spider nevi There were no vitals filed for this visit.   Scheduled inpatient medications . LORazepam  0-4 mg Intravenous Q6H   Or  . LORazepam  0-4 mg Oral Q6H  . [START ON 11/27/2019] LORazepam  0-4 mg Intravenous Q12H   Or  . [START ON 11/27/2019] LORazepam  0-4 mg Oral Q12H  . potassium & sodium phosphates  1 packet Oral TID WC & HS  . thiamine  100 mg Oral Daily   Or  . thiamine  100 mg Intravenous Daily      Intake/Output from previous day: 10/20 0701 - 10/21 0700 In: 100 [IV Piggyback:100] Out: -  Intake/Output this shift: Total I/O In: 300 [IV Piggyback:300] Out: -    Lab Results: Recent Labs    11/25/19 0050 11/25/19 0843  WBC 20.7* 20.1*  HGB 8.3* 7.9*  HCT 23.9* 22.2*  PLT 126* 115*   BMET Recent Labs    11/25/19 0050 11/25/19 0843  NA 129* 130*  K 2.7* 4.0  CL 92* 93*  CO2 25 25  GLUCOSE 112* 97  BUN <5* <5*  CREATININE 0.72 0.63  CALCIUM 7.6* 7.3*   LFT Recent Labs    11/25/19 0843  PROT 7.2  ALBUMIN 1.4*  AST 109*  ALT 29  ALKPHOS 267*  BILITOT 15.5*   PT/INR Recent Labs    11/25/19 0400  LABPROT 21.9*  INR 2.0*   Hepatitis Panel No results for input(s): HEPBSAG, HCVAB, HEPAIGM, HEPBIGM in the last 72 hours.   . CBC Latest Ref Rng & Units 11/25/2019 11/25/2019 03/02/2019  WBC 4.0 - 10.5 K/uL 20.1(H) 20.7(H) 6.4  Hemoglobin 13.0 - 17.0 g/dL 7.9(L) 8.3(L) 12.9(L)  Hematocrit 39 - 52 % 22.2(L) 23.9(L) 36.0(L)  Platelets 150 - 400 K/uL 115(L) 126(L) 119(L)    . CMP Latest Ref Rng & Units 11/25/2019 11/25/2019 03/02/2019   Glucose 70 - 99 mg/dL 97 409(W) 95  BUN 6 - 20 mg/dL <1(X) <9(J) 4(L)  Creatinine 0.61 - 1.24 mg/dL 4.78 2.95 6.21  Sodium 135 - 145 mmol/L 130(L) 129(L) 136  Potassium 3.5 - 5.1 mmol/L 4.0 2.7(LL) 4.0  Chloride 98 - 111 mmol/L 93(L) 92(L) 101  CO2 22 - 32 mmol/L Calcium 8.9 - 10.3 mg/dL 7.3(L) 7.6(L) 8.6  Total Protein 6.5 - 8.1  g/dL 7.2 7.6 8.0  Total Bilirubin 0.3 - 1.2 mg/dL 15.5(H) 16.6(H) 0.7  Alkaline Phos 38 - 126 U/L 267(H) 301(H) -  AST 15 - 41 U/L 109(H) 103(H) 93(H)  ALT 0 - 44 U/L 29 28 104(H)   Studies/Results: DG Chest 2 View  Result Date: 11/25/2019 CLINICAL DATA:  39 year old male with chest pain, shortness of breath. EXAM: CHEST - 2 VIEW COMPARISON:  Chest CT 03/05/2019 and earlier. FINDINGS: Lower lung volumes with small new bilateral pleural effusions. Hazy opacity in the visible upper abdomen suspicious for ascites. Visible bowel-gas pattern within normal limits. Mediastinal contours remain normal. No pneumothorax or pulmonary edema. Chronic right upper lobe scarring and architectural distortion is stable. Visualized tracheal air column is within normal limits. No other acute pulmonary opacity. No acute osseous abnormality identified. IMPRESSION: 1. Lower lung volumes with new small pleural effusions. 2. Possible ascites. 3. Right upper lobe lung scarring. Electronically Signed   By: Odessa Fleming M.D.   On: 11/25/2019 02:09   CT ABDOMEN PELVIS W CONTRAST  Result Date: 11/25/2019 CLINICAL DATA:  Nausea, vomiting, jaundice, abdominal distention. EXAM: CT ABDOMEN AND PELVIS WITH CONTRAST TECHNIQUE: Multidetector CT imaging of the abdomen and pelvis was performed using the standard protocol following bolus administration of intravenous contrast. CONTRAST:  OMNIPAQUE IOHEXOL 300 MG/ML  SOLN COMPARISON:  09/27/2017 FINDINGS: Lower chest: Atelectasis in the lung bases. Minimal bilateral effusions. Hepatobiliary: Diffuse fatty infiltration of the liver. Enlarged  lateral segment left lobe suggest cirrhosis. Portal veins are patent. Gallbladder wall is thickened and there is pericholecystic edema, likely related to liver disease and ascites. No stones identified. No bile duct dilatation. Pancreas: Unremarkable. No pancreatic ductal dilatation or surrounding inflammatory changes. Spleen: Spleen is enlarged.  No focal lesions. Adrenals/Urinary Tract: Adrenal glands are unremarkable. Kidneys are normal, without renal calculi, focal lesion, or hydronephrosis. Bladder is unremarkable. Stomach/Bowel: Stomach, small bowel, and colon are not abnormally distended. There is evidence of diffuse small bowel wall thickening and colonic wall thickening and edema. These changes may represent enterocolitis. Cirrhotic colopathy would also be a possible consideration. If the patient is on ACE inhibitors, this could also cause bowel wall thickening. Vascular/Lymphatic: Normal caliber abdominal aorta. No significant lymphadenopathy. Upper abdominal varices and lower esophageal varices are present. Reproductive: Prostate is unremarkable. Other: Diffuse free fluid throughout the abdomen and pelvis consistent with ascites. Musculoskeletal: No acute or significant osseous findings. IMPRESSION: 1. Diffuse fatty infiltration of the liver.  Probable cirrhosis. 2. Portal venous hypertension with splenic enlargement, upper abdominal varices, and lower esophageal varices. 3. Diffuse small bowel and colonic wall thickening and edema may represent enterocolitis. Cirrhotic colopathy would also be a possible consideration. If the patient is on ACE inhibitors, this could also cause bowel wall thickening. 4. Diffuse free fluid throughout the abdomen and pelvis consistent with ascites. 5. Atelectasis in the lung bases with minimal bilateral effusions. Electronically Signed   By: Burman Nieves M.D.   On: 11/25/2019 06:00    Principal Problem:   Alcoholic hepatitis without ascites Active Problems:    Essential hypertension   Alcohol abuse   Jaundice due to hepatitis   Hypokalemia   Alcohol cessation counseling    Willette Cluster, NP-C @  11/25/2019, 10:26 AM  I have reviewed the entire case in detail with the above APP and discussed the plan in detail.  Therefore, I agree with the diagnoses recorded above. In addition,  I have personally interviewed and examined the patient and have personally reviewed any  abdominal/pelvic CT scan images.  My additional thoughts are as follows:  Weeks of increasing abdominal distention and discomfort as well as scleral icterus, and his wife reports that he was reluctant to seek medical care until now.  He drinks heavily every day and has now presented with decompensated alcohol-related cirrhosis and superimposed alcoholic hepatitis.  He is not overtly GI bleeding, is not encephalopathic, and fortunately renal function remains normal and he is only mildly coagulopathic.  He has tense ascites with tenderness and an elevated WBC, raising concern for SBP.  As there is currently a delay in the radiology department to obtain diagnostic paracentesis (and also to liter therapeutic paracentesis for relief of the discomfort), he has been started on empiric ceftriaxone.  We understand this will affect the results of the paracentesis, but clinical suspicion was high enough.  He is at high risk for alcohol withdrawal and must be on a CIWA protocol.  We have not yet started prednisolone despite his high discriminant function, because there is the concern for SBP.  Viral hepatitis panel will be drawn, but he does not need additional autoimmune liver lab work-up, at least not at this point.  The alcohol appears to sufficiently explain his liver condition.  If iron studies were drawn now they would be high in the setting of acute inflammatory problem and alcohol abuse, and ceruloplasmin would be low given his cholestasis.  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  Thiamine and folic acid supplements needed.  B12 level should be checked.  Thrombocytopenia from marrow suppression and also portal hypertensive splenomegaly and sequestration.   While he is not overtly encephalopathic, lactulose has been started to prevent that from occurring while he most likely goes through alcohol withdrawal and the other events of this hospitalization.  We will follow closely, thank you for involving [place] in his care.  Charlie Pitter III Office:[phone]

## 2019-11-25 NOTE — Progress Notes (Signed)
39 year old gentleman with prior history of hypertension, daily alcohol abuse presents to ED with jaundice, abdominal distention, back pain. He was admitted earlier by Dr. Sedalia Muta please see her note for detailed H&P. He was admitted earlier this morning for acute decompensated liver failure secondary to EtOH abuse associated with hyperbilirubinemia, portal hypertension, splenomegaly, esophageal varices, ascites. GI consulted for further evaluation.  Diagnostic paracentesis with fluid studies have been ordered.  Patient was started empirically on IV Rocephin for possible SBP.   Patient seen and examined at bedside reports some mid upper back pain.  IV morphine and oral tramadol ordered and x-rays of the back have been ordered.  Plan Diagnostic paracentesis with fluid studies, Continue with IV Rocephin Repeat CMP in the morning. .  Viral studies Continue with CIWA protocol TOC consult for EtOH abuse   Kathlen Mody MD

## 2019-11-25 NOTE — ED Triage Notes (Signed)
Patient with nausea and vomiting.  Patient is jaundiced, states that he has not been diagnosed with liver failure but does have elevated LFTs.  Patient states that he is an alcoholic.  Patient does have tight abdomen, states that he has been having chest pain and shortness of breath with the nausea and vomiting.

## 2019-11-25 NOTE — ED Notes (Addendum)
Pt has large dark loose/watery BM

## 2019-11-25 NOTE — Procedures (Signed)
Ultrasound-guided diagnostic and therapeutic paracentesis performed yielding 820 cc  of clear, golden yellow fluid. No immediate complications. The fluid was sent to the lab for preordered studies. Only a small amount of ascites was present on today's [place]. EBL none.

## 2019-11-25 NOTE — ED Notes (Signed)
Pt moved to ED 45; report given to [place], Charity fundraiser.

## 2019-11-25 NOTE — ED Notes (Signed)
Placed pt on hospital bed

## 2019-11-25 NOTE — ED Provider Notes (Signed)
[facility] EMERGENCY DEPARTMENT Provider Note   CSN: [id number] Arrival date & time: 11/25/19  0015     History Chief Complaint  Patient presents with  . Abdominal Pain  . Chest Pain    Zachary Mata is a 39 y.o. male.  Patient presents to the emergency department with a chief complaint of abdominal pain and shortness of breath.  He states that he is an alcoholic.  Reports that over the past week or so he has had swelling in his abdomen.  States that his skin looks yellow.  He states that he has moderate pain in his belly.  He denies any treatments prior to arrival.  Nothing makes his symptoms better or worse.  The history is provided by the patient. No language interpreter was used.       Past Medical History:  Diagnosis Date  . Chronic back pain 03/02/2012  . Essential hypertension 03/02/2012  . Hypertension     Patient Active Problem List   Diagnosis Date Noted  . Infertility male 06/22/2019  . Allergic conjunctivitis and rhinitis, bilateral 04/09/2019  . Transaminitis 03/03/2019  . Normocytic anemia 03/03/2019  . Erectile dysfunction 02/26/2019  . Generalized anxiety disorder 02/26/2019  . COPD (chronic obstructive pulmonary disease) 02/26/2019  . Muscle spasm 02/26/2019  . Smoker 02/26/2019  . Excessive daytime sleepiness 02/26/2019  . Alcohol abuse   . Mental health problem   . Annual physical exam 06/19/2012  . Essential hypertension 03/02/2012  . Chronic back pain 03/02/2012    Past Surgical History:  Procedure Laterality Date  . HAND SURGERY    . WRIST SURGERY         Family History  Problem Relation Age of Onset  . Cancer Mother        breats CA  . Diabetes Mother   . Breast cancer Other     Social History   Tobacco Use  . Smoking status: Current Every Day Smoker    Packs/day: 2.00    Years: 15.00    Pack years: 30.00    Types: Cigarettes  . Smokeless tobacco: Never Used  Vaping Use  . Vaping Use: Never used   Substance Use Topics  . Alcohol use: Yes    Alcohol/week: 6.0 - 7.0 standard drinks    Types: 6 - 7 Cans of beer per week  . Drug use: No    Home Medications Prior to Admission medications   Medication Sig Start Date End Date Taking? Authorizing Provider  azelastine (OPTIVAR) 0.05 % ophthalmic solution Place 2 drops into both eyes 2 (two) times daily. Patient not taking: Reported on 11/19/2019 04/09/19   Monica Becton, MD  busPIRone (BUSPAR) 15 MG tablet 1 tablet p.o. twice daily to 3 times daily Patient not taking: Reported on 11/19/2019 04/09/19   Monica Becton, MD  cetirizine (ZYRTEC) 10 MG chewable tablet Chew 10 mg by mouth daily.    [provider]  fluticasone Aleda Grana) 50 MCG/ACT nasal spray One spray in each nostril twice a day Patient not taking: Reported on 11/19/2019 04/09/19   Monica Becton, MD  lisinopril (ZESTRIL) 20 MG tablet Take 1 tablet (20 mg total) by mouth daily. Patient not taking: Reported on 11/19/2019 04/09/19 04/08/20  Monica Becton, MD  tadalafil (CIALIS) 5 MG tablet Take 1 tablet (5 mg total) by mouth daily as needed for erectile dysfunction. Patient not taking: Reported on 11/19/2019 02/26/19   Monica Becton, MD  umeclidinium-vilanterol [facility] ELLIPTA)  62.5-25 MCG/INH AEPB Inhale 1 puff into the lungs daily. Patient not taking: Reported on 11/19/2019 03/12/19   Monica Becton, MD  Vitamin D, Ergocalciferol, (DRISDOL) 1.25 MG (50000 UNIT) CAPS capsule Take 1 capsule (50,000 Units total) by mouth every 7 (seven) days. Take for 8 total doses(weeks) Patient not taking: Reported on 11/19/2019 03/03/19   Monica Becton, MD    Allergies    Sulfa antibiotics  Review of Systems   Review of Systems  All other systems reviewed and are negative.   Physical Exam Updated Vital Signs BP 117/71   Pulse 98   Temp 97.7 F (36.5 C)   Resp 14   SpO2 96%   Physical Exam Vitals and nursing note reviewed.   Constitutional:      Appearance: He is well-developed.  HENT:     Head: Normocephalic and atraumatic.  Eyes:     General: Scleral icterus present.     Conjunctiva/sclera: Conjunctivae normal.  Cardiovascular:     Rate and Rhythm: Normal rate and regular rhythm.     Heart sounds: No murmur heard.   Pulmonary:     Effort: Pulmonary effort is normal. No respiratory distress.     Breath sounds: Normal breath sounds.  Abdominal:     General: There is distension.     Palpations: Abdomen is soft.     Tenderness: There is abdominal tenderness.  Musculoskeletal:        General: Normal range of motion.     Cervical back: Neck supple.  Skin:    General: Skin is warm and dry.     Coloration: Skin is jaundiced.  Neurological:     Mental Status: He is alert and oriented to person, place, and time.  Psychiatric:        Mood and Affect: Mood normal.        Behavior: Behavior normal.     ED Results / Procedures / Treatments   Labs (all labs ordered are listed, but only abnormal results are displayed) Labs Reviewed  LIPASE, BLOOD - Abnormal; Notable for the following components:      Result Value   Lipase 65 (*)    All other components within normal limits  COMPREHENSIVE METABOLIC PANEL - Abnormal; Notable for the following components:   Sodium 129 (*)    Potassium 2.7 (*)    Chloride 92 (*)    Glucose, Bld 112 (*)    BUN <5 (*)    Calcium 7.6 (*)    Albumin 1.5 (*)    AST 103 (*)    Alkaline Phosphatase 301 (*)    Total Bilirubin 16.6 (*)    All other components within normal limits  CBC - Abnormal; Notable for the following components:   WBC 20.7 (*)    RBC 2.10 (*)    Hemoglobin 8.3 (*)    HCT 23.9 (*)    MCV 113.8 (*)    MCH 39.5 (*)    Platelets 126 (*)    All other components within normal limits  URINALYSIS, ROUTINE W REFLEX MICROSCOPIC - Abnormal; Notable for the following components:   Color, Urine AMBER (*)    APPearance HAZY (*)    Glucose, UA 50 (*)     Bilirubin Urine MODERATE (*)    Protein, ur 30 (*)    Bacteria, UA FEW (*)    All other components within normal limits  TROPONIN I (HIGH SENSITIVITY) - Abnormal; Notable for the following components:   Troponin I (  High Sensitivity) 53 (*)    All other components within normal limits  RESPIRATORY PANEL BY RT PCR (FLU A&B, COVID)  AMMONIA  PROTIME-INR  MAGNESIUM  TROPONIN I (HIGH SENSITIVITY)    EKG None  Radiology DG Chest 2 View  Result Date: 11/25/2019 CLINICAL DATA:  39 year old male with chest pain, shortness of breath. EXAM: CHEST - 2 VIEW COMPARISON:  Chest CT 03/05/2019 and earlier. FINDINGS: Lower lung volumes with small new bilateral pleural effusions. Hazy opacity in the visible upper abdomen suspicious for ascites. Visible bowel-gas pattern within normal limits. Mediastinal contours remain normal. No pneumothorax or pulmonary edema. Chronic right upper lobe scarring and architectural distortion is stable. Visualized tracheal air column is within normal limits. No other acute pulmonary opacity. No acute osseous abnormality identified. IMPRESSION: 1. Lower lung volumes with new small pleural effusions. 2. Possible ascites. 3. Right upper lobe lung scarring. Electronically Signed   By: Odessa Fleming M.D.   On: 11/25/2019 02:09    Procedures Procedures (including critical care time)  Medications Ordered in ED Medications  potassium chloride 10 mEq in 100 mL IVPB (has no administration in time range)    ED Course  I have reviewed the triage vital signs and the nursing notes.  Pertinent labs & imaging results that were available during my care of the patient were reviewed by me and considered in my medical decision making (see chart for details).    MDM Rules/Calculators/A&P                          This patient complains of abdominal distension, this involves an extensive number of treatment options, and is a complaint that carries with it a high risk of complications and  morbidity.    Differential Dx Hepatitis, alcoholic cirrhosis, obstruction of biliary tree  Pertinent Labs I ordered, reviewed, and interpreted labs, which included CBC, CMP, lipase, ammonia, notable for WBC 20.7, ammonia 76, inr 2.0, t.bili 16.  Imaging Interpretation I ordered imaging studies which included CXR concerning for ascites, CT pending.  Medications I ordered medication rocephin, thiamine, potassium.  Reassessments After the interventions stated above, I reevaluated the patient and found comfortable and non-toxic in appearance.  Consultants Dr. Sedalia Muta, [facility], who is appreciated for admitting the patient.  Plan Admit    Final Clinical Impression(s) / ED Diagnoses Final diagnoses:  Abdominal distension  Hyperbilirubinemia  Hyperammonemia [facility])    Rx / DC Orders ED Discharge Orders    None       Roxy Horseman, PA-C 11/25/19 8315    Shon Baton, MD 11/28/19 [phone]

## 2019-11-25 NOTE — ED Notes (Signed)
Pt transported to interventional radiology 

## 2019-11-25 NOTE — H&P (Signed)
History and Physical   Zachary Mata WHQ:[id number] DOB: 25-Apr-1980 DOA: 11/25/2019  PCP: Monica Becton, MD  Patient coming from: home   I have personally briefly reviewed patient's old medical records in [facility] EMR.  Chief Concern: yellow skin  HPI: Zachary Mata is a 39 y.o. male with medical history significant for hypertension, ED, daily alcohol use presents with chief concern of yellowing skin, abdominal swelling x 4 weeks.   He presents today because he was not feeling well. He reports he drinks etoh daily and does not count the number of drinks he has. He drinks throughout the day and his last drink was approximately 11 pm. He reports getting tremulous in 2018 when he was admitted to ICU and was given IV ativan.  ROS was negative for N/V/fever/ cough/chills. ROS was positive for chest pain and shortness of breath  ED Course: discussed with ED provider. Vitals are stable on RA in the ED. ED provider put patient on CIWA protocol, ordered kphos, and thiamine IV  Review of Systems: As per HPI otherwise 10 point review of systems negative.   Past Medical History:  Diagnosis Date  . Chronic back pain 03/02/2012  . Essential hypertension 03/02/2012  . Hypertension    Past Surgical History:  Procedure Laterality Date  . HAND SURGERY    . WRIST SURGERY     Social History:  reports that he has been smoking cigarettes. He has a 30.00 pack-year smoking history. He has never used smokeless tobacco. He reports current alcohol use of about 6.0 - 7.0 standard drinks of alcohol per week. He reports that he does not use drugs.  Allergies  Allergen Reactions  . Sulfa Antibiotics Swelling   Family History  Problem Relation Age of Onset  . Cancer Mother        breats CA  . Diabetes Mother   . Breast cancer Other    Family history: Family history reviewed and no alcohol abuse in the family  Prior to Admission medications   Medication Sig Start Date End Date Taking?  Authorizing Provider  azelastine (OPTIVAR) 0.05 % ophthalmic solution Place 2 drops into both eyes 2 (two) times daily. Patient not taking: Reported on 11/19/2019 04/09/19   Monica Becton, MD  busPIRone (BUSPAR) 15 MG tablet 1 tablet p.o. twice daily to 3 times daily Patient not taking: Reported on 11/19/2019 04/09/19   Monica Becton, MD  fluticasone [facility]) 50 MCG/ACT nasal spray One spray in each nostril twice a day Patient not taking: Reported on 11/19/2019 04/09/19   Monica Becton, MD  lisinopril (ZESTRIL) 20 MG tablet Take 1 tablet (20 mg total) by mouth daily. Patient not taking: Reported on 11/19/2019 04/09/19 04/08/20  Monica Becton, MD  tadalafil (CIALIS) 5 MG tablet Take 1 tablet (5 mg total) by mouth daily as needed for erectile dysfunction. Patient not taking: Reported on 11/19/2019 02/26/19   Monica Becton, MD  umeclidinium-vilanterol [facility] ELLIPTA) 62.5-25 MCG/INH AEPB Inhale 1 puff into the lungs daily. Patient not taking: Reported on 11/19/2019 03/12/19   Monica Becton, MD  Vitamin D, Ergocalciferol, (DRISDOL) 1.25 MG (50000 UNIT) CAPS capsule Take 1 capsule (50,000 Units total) by mouth every 7 (seven) days. Take for 8 total doses(weeks) Patient not taking: Reported on 11/19/2019 03/03/19   Monica Becton, MD   Physical Exam: Vitals:   11/25/19 0051 11/25/19 0305 11/25/19 0453 11/25/19 0500  BP: 120/64 117/71 (!) 177/71 120/70  Pulse:  98  98 88  Resp:  14  17  Temp:  97.7 F (36.5 C)  98.7 F (37.1 C)  TempSrc:    Oral  SpO2:  96%  97%   Constitutional: NAD, calm, comfortable Eyes: PERRL, lids and scleral icterus ENMT: Mucous membranes are moist. Posterior pharynx clear of any exudate or lesions. poor dentition.  Neck: normal, supple, no masses, no thyromegaly Respiratory: clear to auscultation bilaterally, no wheezing, no crackles. Normal respiratory effort. No accessory muscle use.  Cardiovascular: Regular rate and  rhythm, no murmurs / rubs / gallops. B/l pitting edema 2+. 2+ pedal pulses. No carotid bruits.  Abdomen:  no masses palpated. Bowel sounds positive. Distended  Musculoskeletal: no clubbing / cyanosis. No joint deformity upper and lower extremities. Good ROM, no contractures. Normal muscle tone.  Skin: jaundice Neurologic: CN 2-12 grossly intact. Sensation intact, DTR normal. Strength 5/5 in all 4. No tremors with bilateral arm extended flexion Psychiatric: Normal judgment and insight. Alert and oriented x 3. Normal mood.   Labs on Admission: I have personally reviewed following labs and imaging studies  CBC: Recent Labs  Lab 11/25/19 0050  WBC 20.7*  HGB 8.3*  HCT 23.9*  MCV 113.8*  PLT 126*   Basic Metabolic Panel: Recent Labs  Lab 11/25/19 0050 11/25/19 0417  NA 129*  --   K 2.7*  --   CL 92*  --   CO2 25  --   GLUCOSE 112*  --   BUN <5*  --   CREATININE 0.72  --   CALCIUM 7.6*  --   MG  --  1.7   GFR: CrCl cannot be calculated (Unknown ideal weight.). Liver Function Tests: Recent Labs  Lab 11/25/19 0050  AST 103*  ALT 28  ALKPHOS 301*  BILITOT 16.6*  PROT 7.6  ALBUMIN 1.5*   Recent Labs  Lab 11/25/19 0050  LIPASE 65*   Recent Labs  Lab 11/25/19 0400  AMMONIA 76*   Coagulation Profile: Recent Labs  Lab 11/25/19 0400  INR 2.0*   Urine analysis:    Component Value Date/Time   COLORURINE AMBER (A) 11/25/2019 0054   APPEARANCEUR HAZY (A) 11/25/2019 0054   LABSPEC 1.023 11/25/2019 0054   PHURINE 6.0 11/25/2019 0054   GLUCOSEU 50 (A) 11/25/2019 0054   HGBUR NEGATIVE 11/25/2019 0054   BILIRUBINUR MODERATE (A) 11/25/2019 0054   KETONESUR NEGATIVE 11/25/2019 0054   PROTEINUR 30 (A) 11/25/2019 0054   UROBILINOGEN 1.0 07/01/2014 2335   NITRITE NEGATIVE 11/25/2019 0054   LEUKOCYTESUR NEGATIVE 11/25/2019 0054   Radiological Exams on Admission: Personally reviewed and I agree with radiologist reading as below.  DG Chest 2 View  Result Date:  11/25/2019 CLINICAL DATA:  39 year old male with chest pain, shortness of breath. EXAM: CHEST - 2 VIEW COMPARISON:  Chest CT 03/05/2019 and earlier. FINDINGS: Lower lung volumes with small new bilateral pleural effusions. Hazy opacity in the visible upper abdomen suspicious for ascites. Visible bowel-gas pattern within normal limits. Mediastinal contours remain normal. No pneumothorax or pulmonary edema. Chronic right upper lobe scarring and architectural distortion is stable. Visualized tracheal air column is within normal limits. No other acute pulmonary opacity. No acute osseous abnormality identified. IMPRESSION: 1. Lower lung volumes with new small pleural effusions. 2. Possible ascites. 3. Right upper lobe lung scarring. Electronically Signed   By: Odessa Fleming M.D.   On: 11/25/2019 02:09   CT ABDOMEN PELVIS W CONTRAST  Result Date: 11/25/2019 CLINICAL DATA:  Nausea, vomiting, jaundice, abdominal distention. EXAM:  CT ABDOMEN AND PELVIS WITH CONTRAST TECHNIQUE: Multidetector CT imaging of the abdomen and pelvis was performed using the standard protocol following bolus administration of intravenous contrast. CONTRAST:  OMNIPAQUE IOHEXOL 300 MG/ML  SOLN COMPARISON:  09/27/2017 FINDINGS: Lower chest: Atelectasis in the lung bases. Minimal bilateral effusions. Hepatobiliary: Diffuse fatty infiltration of the liver. Enlarged lateral segment left lobe suggest cirrhosis. Portal veins are patent. Gallbladder wall is thickened and there is pericholecystic edema, likely related to liver disease and ascites. No stones identified. No bile duct dilatation. Pancreas: Unremarkable. No pancreatic ductal dilatation or surrounding inflammatory changes. Spleen: Spleen is enlarged.  No focal lesions. Adrenals/Urinary Tract: Adrenal glands are unremarkable. Kidneys are normal, without renal calculi, focal lesion, or hydronephrosis. Bladder is unremarkable. Stomach/Bowel: Stomach, small bowel, and colon are not abnormally  distended. There is evidence of diffuse small bowel wall thickening and colonic wall thickening and edema. These changes may represent enterocolitis. Cirrhotic colopathy would also be a possible consideration. If the patient is on ACE inhibitors, this could also cause bowel wall thickening. Vascular/Lymphatic: Normal caliber abdominal aorta. No significant lymphadenopathy. Upper abdominal varices and lower esophageal varices are present. Reproductive: Prostate is unremarkable. Other: Diffuse free fluid throughout the abdomen and pelvis consistent with ascites. Musculoskeletal: No acute or significant osseous findings. IMPRESSION: 1. Diffuse fatty infiltration of the liver.  Probable cirrhosis. 2. Portal venous hypertension with splenic enlargement, upper abdominal varices, and lower esophageal varices. 3. Diffuse small bowel and colonic wall thickening and edema may represent enterocolitis. Cirrhotic colopathy would also be a possible consideration. If the patient is on ACE inhibitors, this could also cause bowel wall thickening. 4. Diffuse free fluid throughout the abdomen and pelvis consistent with ascites. 5. Atelectasis in the lung bases with minimal bilateral effusions. Electronically Signed   By: Burman Nieves M.D.   On: 11/25/2019 06:00   EKG: Independently reviewed, showing normal sinus rhythm with rate of 99  Assessment/Plan  Principal Problem:   Alcoholic hepatitis without ascites Active Problems:   Essential hypertension   Alcohol abuse   Jaundice due to hepatitis   Hypokalemia   Alcohol cessation counseling   # alcoholic hepatits - CT abd/pel pending - Check phosphorus, holding diet until phos level has resulted - Banana bag once - Thiamine IV - Extensive counseling given for alcohol cessation  - LFTs in the AM - Ammonia is elevated, not encephalopathic at this time  - Glascow score is 9 - consider corticosteroid in the AM if AM provider feel patient will benefit   # Jaundice   # Alcohol abuse - continue ciwa protocol - Thiamine, folic, multivitamin  # hyponatremia and hypochloridemia  # Hypokalemia 2/2 to etoh use - Banana bag once - BMP in the AM - Potassium replacement via IV and phosnak   # Leukocytosis suspect secondary to alcholic hepatitis - no indicate for abx at this time - He received dose of ceftriaxone in the ED  # Tobacco use - 1.5 ppd  DVT prophylaxis: holding  Code Status: full Diet: npo pending phos level Family Communication: discussed with spouse at bedside Disposition Plan: pending clinical course Consults called: none at this time, AP provider to consider GI consult if feel indicated Admission status: obs with telel  Ashiya Kinkead N Kae Lauman D.O. [facility]  If 7AM-7PM, please contact day-coverage provider [email or web address]  11/25/2019, 6:24 AM

## 2019-11-26 DIAGNOSIS — D62 Acute posthemorrhagic anemia: Secondary | ICD-10-CM | POA: Diagnosis present

## 2019-11-26 DIAGNOSIS — K7201 Acute and subacute hepatic failure with coma: Secondary | ICD-10-CM | POA: Diagnosis not present

## 2019-11-26 DIAGNOSIS — F10939 Alcohol use, unspecified with withdrawal, unspecified: Secondary | ICD-10-CM | POA: Diagnosis present

## 2019-11-26 DIAGNOSIS — F10239 Alcohol dependence with withdrawal, unspecified: Secondary | ICD-10-CM | POA: Diagnosis present

## 2019-11-26 DIAGNOSIS — K7200 Acute and subacute hepatic failure without coma: Secondary | ICD-10-CM | POA: Diagnosis present

## 2019-11-26 DIAGNOSIS — E46 Unspecified protein-calorie malnutrition: Secondary | ICD-10-CM | POA: Diagnosis present

## 2019-11-26 DIAGNOSIS — I10 Essential (primary) hypertension: Secondary | ICD-10-CM | POA: Diagnosis present

## 2019-11-26 DIAGNOSIS — J449 Chronic obstructive pulmonary disease, unspecified: Secondary | ICD-10-CM | POA: Diagnosis present

## 2019-11-26 DIAGNOSIS — K652 Spontaneous bacterial peritonitis: Secondary | ICD-10-CM | POA: Diagnosis present

## 2019-11-26 DIAGNOSIS — R161 Splenomegaly, not elsewhere classified: Secondary | ICD-10-CM | POA: Diagnosis present

## 2019-11-26 DIAGNOSIS — E876 Hypokalemia: Secondary | ICD-10-CM | POA: Diagnosis present

## 2019-11-26 DIAGNOSIS — K7011 Alcoholic hepatitis with ascites: Secondary | ICD-10-CM | POA: Diagnosis present

## 2019-11-26 DIAGNOSIS — E871 Hypo-osmolality and hyponatremia: Secondary | ICD-10-CM | POA: Diagnosis present

## 2019-11-26 DIAGNOSIS — K7040 Alcoholic hepatic failure without coma: Secondary | ICD-10-CM | POA: Diagnosis present

## 2019-11-26 DIAGNOSIS — I8511 Secondary esophageal varices with bleeding: Secondary | ICD-10-CM | POA: Diagnosis present

## 2019-11-26 DIAGNOSIS — K7010 Alcoholic hepatitis without ascites: Secondary | ICD-10-CM | POA: Diagnosis not present

## 2019-11-26 DIAGNOSIS — F419 Anxiety disorder, unspecified: Secondary | ICD-10-CM | POA: Diagnosis present

## 2019-11-26 DIAGNOSIS — M79609 Pain in unspecified limb: Secondary | ICD-10-CM | POA: Diagnosis not present

## 2019-11-26 DIAGNOSIS — E8770 Fluid overload, unspecified: Secondary | ICD-10-CM | POA: Diagnosis present

## 2019-11-26 DIAGNOSIS — F1010 Alcohol abuse, uncomplicated: Secondary | ICD-10-CM | POA: Diagnosis not present

## 2019-11-26 DIAGNOSIS — F17210 Nicotine dependence, cigarettes, uncomplicated: Secondary | ICD-10-CM | POA: Diagnosis present

## 2019-11-26 DIAGNOSIS — K766 Portal hypertension: Secondary | ICD-10-CM | POA: Diagnosis present

## 2019-11-26 DIAGNOSIS — D689 Coagulation defect, unspecified: Secondary | ICD-10-CM | POA: Diagnosis present

## 2019-11-26 DIAGNOSIS — K7290 Hepatic failure, unspecified without coma: Secondary | ICD-10-CM | POA: Diagnosis not present

## 2019-11-26 DIAGNOSIS — D6959 Other secondary thrombocytopenia: Secondary | ICD-10-CM | POA: Diagnosis present

## 2019-11-26 DIAGNOSIS — E559 Vitamin D deficiency, unspecified: Secondary | ICD-10-CM | POA: Diagnosis present

## 2019-11-26 DIAGNOSIS — J690 Pneumonitis due to inhalation of food and vomit: Secondary | ICD-10-CM | POA: Diagnosis not present

## 2019-11-26 DIAGNOSIS — F32A Depression, unspecified: Secondary | ICD-10-CM | POA: Diagnosis present

## 2019-11-26 DIAGNOSIS — D539 Nutritional anemia, unspecified: Secondary | ICD-10-CM | POA: Diagnosis present

## 2019-11-26 DIAGNOSIS — K7031 Alcoholic cirrhosis of liver with ascites: Secondary | ICD-10-CM | POA: Diagnosis present

## 2019-11-26 DIAGNOSIS — K759 Inflammatory liver disease, unspecified: Secondary | ICD-10-CM | POA: Diagnosis not present

## 2019-11-26 DIAGNOSIS — Z20822 Contact with and (suspected) exposure to covid-19: Secondary | ICD-10-CM | POA: Diagnosis present

## 2019-11-26 DIAGNOSIS — M7989 Other specified soft tissue disorders: Secondary | ICD-10-CM | POA: Diagnosis not present

## 2019-11-26 LAB — CBC WITH DIFFERENTIAL/PLATELET
Abs Immature Granulocytes: 0.21 10*3/uL — ABNORMAL HIGH (ref 0.00–0.07)
Basophils Absolute: 0.1 10*3/uL (ref 0.0–0.1)
Basophils Relative: 1 %
Eosinophils Absolute: 0.3 10*3/uL (ref 0.0–0.5)
Eosinophils Relative: 2 %
HCT: 21.6 % — ABNORMAL LOW (ref 39.0–52.0)
Hemoglobin: 7.4 g/dL — ABNORMAL LOW (ref 13.0–17.0)
Immature Granulocytes: 1 %
Lymphocytes Relative: 8 %
Lymphs Abs: 1.6 10*3/uL (ref 0.7–4.0)
MCH: 40 pg — ABNORMAL HIGH (ref 26.0–34.0)
MCHC: 34.3 g/dL (ref 30.0–36.0)
MCV: 116.8 fL — ABNORMAL HIGH (ref 80.0–100.0)
Monocytes Absolute: 1.6 10*3/uL — ABNORMAL HIGH (ref 0.1–1.0)
Monocytes Relative: 8 %
Neutro Abs: 15.8 10*3/uL — ABNORMAL HIGH (ref 1.7–7.7)
Neutrophils Relative %: 80 %
Platelets: 101 10*3/uL — ABNORMAL LOW (ref 150–400)
RBC: 1.85 MIL/uL — ABNORMAL LOW (ref 4.22–5.81)
RDW: 14.3 % (ref 11.5–15.5)
WBC: 19.6 10*3/uL — ABNORMAL HIGH (ref 4.0–10.5)
nRBC: 0.1 % (ref 0.0–0.2)

## 2019-11-26 LAB — BASIC METABOLIC PANEL
Anion gap: 11 (ref 5–15)
BUN: <5 mg/dL — ABNORMAL LOW (ref 6–20)
CO2: 25 mmol/L (ref 22–32)
Calcium: 7.5 mg/dL — ABNORMAL LOW (ref 8.9–10.3)
Chloride: 95 mmol/L — ABNORMAL LOW (ref 98–111)
Creatinine, Ser: 0.55 mg/dL — ABNORMAL LOW (ref 0.61–1.24)
GFR, Estimated: >60 mL/min (ref >60)
Glucose, Bld: 53 mg/dL — ABNORMAL LOW (ref 70–99)
Potassium: 3.4 mmol/L — ABNORMAL LOW (ref 3.5–5.1)
Sodium: 131 mmol/L — ABNORMAL LOW (ref 135–145)

## 2019-11-26 LAB — HEPATIC FUNCTION PANEL
ALT: 26 U/L (ref 0–44)
AST: 103 U/L — ABNORMAL HIGH (ref 15–41)
Albumin: 1.4 g/dL — ABNORMAL LOW (ref 3.5–5.0)
Alkaline Phosphatase: 261 U/L — ABNORMAL HIGH (ref 38–126)
Bilirubin, Direct: 8.7 mg/dL — ABNORMAL HIGH (ref 0.0–0.2)
Indirect Bilirubin: 8.1 mg/dL — ABNORMAL HIGH (ref 0.3–0.9)
Total Bilirubin: 16.8 mg/dL — ABNORMAL HIGH (ref 0.3–1.2)
Total Protein: 7.1 g/dL (ref 6.5–8.1)

## 2019-11-26 LAB — PROTIME-INR
INR: 2.1 — ABNORMAL HIGH (ref 0.8–1.2)
Prothrombin Time: 23 seconds — ABNORMAL HIGH (ref 11.4–15.2)

## 2019-11-26 LAB — VITAMIN B12: Vitamin B-12: 3986 pg/mL — ABNORMAL HIGH (ref 180–914)

## 2019-11-26 MED ORDER — POTASSIUM CHLORIDE CRYS ER 20 MEQ PO TBCR
40.0000 meq | EXTENDED_RELEASE_TABLET | Freq: Once | ORAL | Status: AC
  Administered 2019-11-26: 40 meq via ORAL
  Filled 2019-11-26: qty 2

## 2019-11-26 MED ORDER — LORAZEPAM 2 MG/ML IJ SOLN
0.0000 mg | Freq: Three times a day (TID) | INTRAMUSCULAR | Status: AC
  Administered 2019-11-28: 2 mg via INTRAVENOUS
  Administered 2019-11-29: 4 mg via INTRAVENOUS
  Administered 2019-11-29 (×2): 1 mg via INTRAVENOUS
  Filled 2019-11-26 (×4): qty 1
  Filled 2019-11-26: qty 2
  Filled 2019-11-26 (×2): qty 1

## 2019-11-26 MED ORDER — SPIRONOLACTONE 25 MG PO TABS
50.0000 mg | ORAL_TABLET | Freq: Every day | ORAL | Status: DC
  Administered 2019-11-26 – 2019-11-27 (×2): 50 mg via ORAL
  Filled 2019-11-26 (×2): qty 2

## 2019-11-26 MED ORDER — LORAZEPAM 2 MG/ML IJ SOLN
0.0000 mg | INTRAMUSCULAR | Status: AC
  Administered 2019-11-26: 1 mg via INTRAVENOUS
  Administered 2019-11-26: 2 mg via INTRAVENOUS
  Administered 2019-11-26: 1 mg via INTRAVENOUS
  Administered 2019-11-26 (×2): 2 mg via INTRAVENOUS
  Filled 2019-11-26 (×5): qty 1

## 2019-11-26 MED ORDER — FUROSEMIDE 20 MG PO TABS
20.0000 mg | ORAL_TABLET | Freq: Every day | ORAL | Status: DC
  Administered 2019-11-26 – 2019-11-27 (×2): 20 mg via ORAL
  Filled 2019-11-26 (×2): qty 1

## 2019-11-26 MED ORDER — LORAZEPAM 2 MG/ML IJ SOLN
1.0000 mg | INTRAMUSCULAR | Status: AC | PRN
  Administered 2019-11-26: 1 mg via INTRAVENOUS
  Administered 2019-11-26 (×2): 2 mg via INTRAVENOUS
  Administered 2019-11-26: 1 mg via INTRAVENOUS
  Administered 2019-11-26 – 2019-11-28 (×4): 2 mg via INTRAVENOUS
  Administered 2019-11-28: 1 mg via INTRAVENOUS
  Administered 2019-11-28 (×3): 2 mg via INTRAVENOUS
  Filled 2019-11-26 (×10): qty 1

## 2019-11-26 MED ORDER — NICOTINE 21 MG/24HR TD PT24
21.0000 mg | MEDICATED_PATCH | Freq: Every day | TRANSDERMAL | Status: DC
  Administered 2019-11-26 – 2019-12-04 (×9): 21 mg via TRANSDERMAL
  Filled 2019-11-26 (×10): qty 1

## 2019-11-26 MED ORDER — LORAZEPAM 1 MG PO TABS
1.0000 mg | ORAL_TABLET | ORAL | Status: AC | PRN
  Administered 2019-11-26: 2 mg via ORAL
  Administered 2019-11-26: 1 mg via ORAL

## 2019-11-26 MED ORDER — DEXTROSE 5 % IV SOLN: INTRAVENOUS | Status: DC

## 2019-11-26 MED ORDER — PREDNISOLONE 5 MG PO TABS
40.0000 mg | ORAL_TABLET | Freq: Every day | ORAL | Status: DC
  Administered 2019-11-26 – 2019-12-04 (×9): 40 mg via ORAL
  Filled 2019-11-26 (×9): qty 8

## 2019-11-26 NOTE — Progress Notes (Signed)
PROGRESS NOTE    Zachary Mata  FYB:[id number] DOB: 1981-01-02 DOA: 11/25/2019 PCP: Monica Becton, MD    Chief Complaint  Patient presents with  . Abdominal Pain  . Chest Pain    Brief Narrative:   39 year old gentleman with prior history of hypertension, daily alcohol abuse presents to ED with jaundice, abdominal distention, back pain. He was admitted earlier by Dr. Sedalia Muta please see her note for detailed H&P. He was admitted earlier this morning for acute decompensated liver failure secondary to EtOH abuse associated with hyperbilirubinemia, portal hypertension, splenomegaly, esophageal varices, ascites. GI consulted for further evaluation.  Diagnostic paracentesis with fluid studies have been ordered.  Patient was started empirically on IV Rocephin for possible SBP.  Assessment & Plan:   Principal Problem:   Alcoholic hepatitis without ascites Active Problems:   Essential hypertension   Alcohol abuse   Jaundice due to hepatitis   Hypokalemia   Alcohol cessation counseling   Alcohol withdrawal (HCC)   Liver cirrhosis with decompensated liver failure Associated with EtOH hepatitis, CT of the abdomen pelvis shows liver cirrhosis, splenomegaly, esophageal varices, portal hypertension and ascites. GI consulted recommended ultrasound paracentesis and sending the ascitic fluid for analysis and culture. Patient started on empiric Rocephin for possible SBP. Patient was started on lactulose by gastroenterology.  Patient had few bowel movements.  Patient is oriented and able to answer questions appropriately.Maddrey discriminant function is 44, steroids on hold for possible SBP.     Small bowel and colon wall thickening Incidental finding on CT abdomen.  Pt currently is nt nauseated or vomiting. Wife at bedside reported vomiting prior to admission.    Essential Hypertension;  Well controlled.    Hypokalemia: replaced.    Alcohol abuse:  TOC consulted for  resources.  On CIWA protocol for alcohol withdrawal symptoms.     Anemia or thrombocytopenia:  Probably secondary to liver cirrhosis.     Leukocytosis:  - probably from SBP.    Hypoalbuminemia:  From liver cirrhosis.     Esophageal varices:  Will probably need an EGD in the future.    Hyponatremia:  Probably from hypervolemic hyponatremia.      DVT prophylaxis:scd's Code Status: (FULL CODE Family Communication: Family at bedside.  Disposition:   Status is: Observation  The patient will require care spanning > 2 midnights and should be moved to inpatient because: Hemodynamically unstable, Ongoing diagnostic testing needed not appropriate for outpatient work up, Unsafe d/c plan, IV treatments appropriate due to intensity of illness or inability to take PO and Inpatient level of care appropriate due to severity of illness  Dispo: The patient is from: Home              Anticipated d/c is to: pending              Anticipated d/c date is: > 3 days              Patient currently is not medically stable to d/c.       Consultants:   Gastroenterology    Procedures; Paracentesis about 820 ml fluid taken off.   Antimicrobials: rocephin sine admission   Subjective: Pt sleeping comfortably. No new complaints.   Objective: Vitals:   11/26/19 0430 11/26/19 0503 11/26/19 0603 11/26/19 0615  BP: 123/76 124/80 124/80 122/78  Pulse: (!) 121 (!) 123 (!) 118 (!) 118  Resp: 18   20  Temp:      TempSrc:      SpO2: 100%  96%    Intake/Output Summary (Last 24 hours) at 11/26/2019 0814 Last data filed at 11/25/2019 1729 Gross per 24 hour  Intake 1200 ml  Output --  Net 1200 ml   There were no vitals filed for this visit.  Examination:  General exam:  Jaundiced. comfortable , not in distress.  Respiratory system: Clear to auscultation. Respiratory effort normal. Cardiovascular system: S1 & S2 heard, tachycardic.  Gastrointestinal system: Abdomen is firm  tender and mildly distended,  Central nervous system: would wake up and answer questions and go back to sleep.  Extremities: Symmetric 5 x 5 power. Skin: spider naevi present.  Psychiatry:  Mood & affect appropriate.     Data Reviewed: I have personally reviewed following labs and imaging studies  CBC: Recent Labs  Lab 11/25/19 0050 11/25/19 0843 11/26/19 0212  WBC 20.7* 20.1* 19.6*  NEUTROABS  --   --  15.8*  HGB 8.3* 7.9* 7.4*  HCT 23.9* 22.2* 21.6*  MCV 113.8* 114.4* 116.8*  PLT 126* 115* 101*    Basic Metabolic Panel: Recent Labs  Lab 11/25/19 0050 11/25/19 0417 11/25/19 0843 11/26/19 0212  NA 129*  --  130* 131*  K 2.7*  --  4.0 3.4*  CL 92*  --  93* 95*  CO2 25  --  25 25  GLUCOSE 112*  --  97 53*  BUN <5*  --  <5* <5*  CREATININE 0.72  --  0.63 0.55*  CALCIUM 7.6*  --  7.3* 7.5*  MG  --  1.7  --   --   PHOS  --   --  4.4  --     GFR: CrCl cannot be calculated (Unknown ideal weight.).  Liver Function Tests: Recent Labs  Lab 11/25/19 0050 11/25/19 0843 11/26/19 0212  AST 103* 109* 103*  ALT 28 29 26   ALKPHOS 301* 267* 261*  BILITOT 16.6* 15.5* 16.8*  PROT 7.6 7.2 7.1  ALBUMIN 1.5* 1.4* 1.4*    CBG: No results for input(s): GLUCAP in the last 168 hours.   Recent Results (from the past 240 hour(s))  Respiratory Panel by RT PCR (Flu A&B, Covid) - Nasopharyngeal Swab     Status: None   Collection Time: 11/25/19  4:24 AM   Specimen: Nasopharyngeal Swab  Result Value Ref Range Status   SARS Coronavirus 2 by RT PCR NEGATIVE NEGATIVE Final    Comment: (NOTE) SARS-CoV-2 target nucleic acids are NOT DETECTED.  The SARS-CoV-2 RNA is generally detectable in upper respiratoy specimens during the acute phase of infection. The lowest concentration of SARS-CoV-2 viral copies this assay can detect is 131 copies/mL. A negative result does not preclude SARS-Cov-2 infection and should not be used as the sole basis for treatment or other patient  management decisions. A negative result may occur with  improper specimen collection/handling, submission of specimen other than nasopharyngeal swab, presence of viral mutation(s) within the areas targeted by this assay, and inadequate number of viral copies (<131 copies/mL). A negative result must be combined with clinical observations, patient history, and epidemiological information. The expected result is Negative.  Fact Sheet for Patients:  11/27/19  Fact Sheet for Healthcare Providers:  https://www.moore.com/  This test is no t yet approved or cleared by the https://www.young.biz/ FDA and  has been authorized for detection and/or diagnosis of SARS-CoV-2 by FDA under an Emergency Use Authorization (EUA). This EUA will remain  in effect (meaning this test can be used) for the duration of the COVID-19 declaration  under Section 564(b)(1) of the Act, 21 U.S.C. section 360bbb-3(b)(1), unless the authorization is terminated or revoked sooner.     Influenza A by PCR NEGATIVE NEGATIVE Final   Influenza B by PCR NEGATIVE NEGATIVE Final    Comment: (NOTE) The Xpert Xpress SARS-CoV-2/FLU/RSV assay is intended as an aid in  the diagnosis of influenza from Nasopharyngeal swab specimens and  should not be used as a sole basis for treatment. Nasal washings and  aspirates are unacceptable for Xpert Xpress SARS-CoV-2/FLU/RSV  testing.  Fact Sheet for Patients: https://www.moore.com/  Fact Sheet for Healthcare Providers: https://www.young.biz/  This test is not yet approved or cleared by the [place] FDA and  has been authorized for detection and/or diagnosis of SARS-CoV-2 by  FDA under an Emergency Use Authorization (EUA). This EUA will remain  in effect (meaning this test can be used) for the duration of the  Covid-19 declaration under Section 564(b)(1) of the Act, 21  U.S.C. section 360bbb-3(b)(1),  unless the authorization is  terminated or revoked. Performed at [facility], [address], [place], [place] [address]   Gram stain     Status: None   Collection Time: 11/25/19  4:17 PM   Specimen: Abdomen; Peritoneal Fluid  Result Value Ref Range Status   Specimen Description FLUID PERITONEAL ABDOMEN  Final   Special Requests NONE  Final   Gram Stain   Final    WBC PRESENT, PREDOMINANTLY MONONUCLEAR RED BLOOD CELLS PRESENT NO ORGANISMS SEEN CYTOSPIN SMEAR Performed at [facility], [address], [place], [place] [address]    Report Status 11/25/2019 FINAL  Final         Radiology Studies: DG Chest 2 View  Result Date: 11/25/2019 CLINICAL DATA:  39 year old male with chest pain, shortness of breath. EXAM: CHEST - 2 VIEW COMPARISON:  Chest CT 03/05/2019 and earlier. FINDINGS: Lower lung volumes with small new bilateral pleural effusions. Hazy opacity in the visible upper abdomen suspicious for ascites. Visible bowel-gas pattern within normal limits. Mediastinal contours remain normal. No pneumothorax or pulmonary edema. Chronic right upper lobe scarring and architectural distortion is stable. Visualized tracheal air column is within normal limits. No other acute pulmonary opacity. No acute osseous abnormality identified. IMPRESSION: 1. Lower lung volumes with new small pleural effusions. 2. Possible ascites. 3. Right upper lobe lung scarring. Electronically Signed   By: Odessa Fleming M.D.   On: 11/25/2019 02:09   DG Thoracic Spine 2 View  Result Date: 11/25/2019 CLINICAL DATA:  Back pain EXAM: THORACIC SPINE 2 VIEWS COMPARISON:  03/02/2012 FINDINGS: There is no evidence of thoracic spine fracture. Alignment is normal. Intervertebral disc heights are preserved. No other significant bone abnormalities are identified. IMPRESSION: Negative. Electronically Signed   By: Duanne Guess D.O.   On: 11/25/2019 16:38   DG Lumbar Spine 2-3 Views  Result Date:  11/25/2019 CLINICAL DATA:  Back pain EXAM: LUMBAR SPINE - 2-3 VIEW COMPARISON:  03/02/2012 FINDINGS: There is no evidence of lumbar spine fracture. Alignment is normal. Intervertebral disc spaces are maintained. No apparent facet joint arthropathy. Excreted contrast present within the bladder. IMPRESSION: Negative. Electronically Signed   By: Duanne Guess D.O.   On: 11/25/2019 16:37   CT ABDOMEN PELVIS W CONTRAST  Result Date: 11/25/2019 CLINICAL DATA:  Nausea, vomiting, jaundice, abdominal distention. EXAM: CT ABDOMEN AND PELVIS WITH CONTRAST TECHNIQUE: Multidetector CT imaging of the abdomen and pelvis was performed using the standard protocol following bolus administration of intravenous contrast. CONTRAST:  100mL OMNIPAQUE IOHEXOL 300 MG/ML  SOLN COMPARISON:  09/27/2017 FINDINGS: Lower chest: Atelectasis in the lung bases. Minimal bilateral effusions. Hepatobiliary: Diffuse fatty infiltration of the liver. Enlarged lateral segment left lobe suggest cirrhosis. Portal veins are patent. Gallbladder wall is thickened and there is pericholecystic edema, likely related to liver disease and ascites. No stones identified. No bile duct dilatation. Pancreas: Unremarkable. No pancreatic ductal dilatation or surrounding inflammatory changes. Spleen: Spleen is enlarged.  No focal lesions. Adrenals/Urinary Tract: Adrenal glands are unremarkable. Kidneys are normal, without renal calculi, focal lesion, or hydronephrosis. Bladder is unremarkable. Stomach/Bowel: Stomach, small bowel, and colon are not abnormally distended. There is evidence of diffuse small bowel wall thickening and colonic wall thickening and edema. These changes may represent enterocolitis. Cirrhotic colopathy would also be a possible consideration. If the patient is on ACE inhibitors, this could also cause bowel wall thickening. Vascular/Lymphatic: Normal caliber abdominal aorta. No significant lymphadenopathy. Upper abdominal varices and lower  esophageal varices are present. Reproductive: Prostate is unremarkable. Other: Diffuse free fluid throughout the abdomen and pelvis consistent with ascites. Musculoskeletal: No acute or significant osseous findings. IMPRESSION: 1. Diffuse fatty infiltration of the liver.  Probable cirrhosis. 2. Portal venous hypertension with splenic enlargement, upper abdominal varices, and lower esophageal varices. 3. Diffuse small bowel and colonic wall thickening and edema may represent enterocolitis. Cirrhotic colopathy would also be a possible consideration. If the patient is on ACE inhibitors, this could also cause bowel wall thickening. 4. Diffuse free fluid throughout the abdomen and pelvis consistent with ascites. 5. Atelectasis in the lung bases with minimal bilateral effusions. Electronically Signed   By: Burman NievesWilliam  Stevens M.D.   On: 11/25/2019 06:00   IR Paracentesis  Result Date: 11/26/2019 INDICATION: Patient with history of alcoholic cirrhosis, jaundice, ascites. Request received for diagnostic and therapeutic paracentesis. EXAM: ULTRASOUND GUIDED DIAGNOSTIC AND THERAPEUTIC PARACENTESIS MEDICATIONS: 1% lidocaine to skin and subcutaneous tissue COMPLICATIONS: None immediate. PROCEDURE: Informed written consent was obtained from the patient after a discussion of the risks, benefits and alternatives to treatment. A timeout was performed prior to the initiation of the procedure. Initial ultrasound scanning demonstrates a small amount of ascites within the right lower abdominal quadrant. The right lower abdomen was prepped and draped in the usual sterile fashion. 1% lidocaine was used for local anesthesia. Following this, a 19 gauge, 7-cm, Yueh catheter was introduced. An ultrasound image was saved for documentation purposes. The paracentesis was performed. The catheter was removed and a dressing was applied. The patient tolerated the procedure well without immediate post procedural complication. FINDINGS: A total of  approximately 820 cc of clear, golden yellow fluid was removed. Samples were sent to the laboratory as requested by the clinical team. IMPRESSION: Successful ultrasound-guided diagnostic and therapeutic paracentesis yielding 820 cc of peritoneal fluid. Read by: Jeananne RamaKevin Allred, PA-C Electronically Signed   By: Gilmer MorJaime  Wagner D.O.   On: 11/25/2019 16:10        Scheduled Meds: . lactulose  20 g Oral BID  . LORazepam  0-4 mg Intravenous Q4H   Followed by  . [START ON 11/28/2019] LORazepam  0-4 mg Intravenous Q8H  . potassium & sodium phosphates  1 packet Oral TID WC & HS  . thiamine  100 mg Oral Daily   Or  . thiamine  100 mg Intravenous Daily   Continuous Infusions: . cefTRIAXone (ROCEPHIN)  IV 2 g (11/26/19 0604)     LOS: 0 days     Kathlen ModyVijaya Willia Genrich, MD [facility]  To contact the attending provider between 7A-7P or the covering provider during after hours 7P-7A, please log into the web site [email or web address] and access using universal [place] password for that web site. If you do not have the password, please call the hospital operator.  11/26/2019, 8:14 AM

## 2019-11-26 NOTE — ED Notes (Signed)
First contact. Change of shift. Pt asleep in bed. NADN at this time. Wife at bedside. Will continue to monitor per CIWA

## 2019-11-26 NOTE — ED Notes (Signed)
Spoke to hospital oncall. Patient ciwa score increasing and medication provided providing little relief.

## 2019-11-26 NOTE — ED Notes (Signed)
Lunch Tray Ordered @ 1020.  

## 2019-11-26 NOTE — ED Notes (Signed)
Pt has arrived to the unit via bed. This patient has all belongings and wife is at bedside. All equipment has been sent and all IV are intact. Pt is asleep but is following commands. Call light and telecare are within reach.   11/26/19 1246  Vitals  Temp 98.7 F (37.1 C)  Temp Source Oral  BP 111/77  MAP (mmHg) 88  BP Location Right Arm  BP Method Automatic  Patient Position (if appropriate) Lying  Pulse Rate (!) 109  Pulse Rate Source Monitor  Resp (!) 22  Level of Consciousness  Level of Consciousness Alert  MEWS COLOR  MEWS Score Color Yellow  Oxygen Therapy  SpO2 98 %  O2 Device Nasal Cannula  O2 Flow Rate (L/min) 2 L/min  Pain Assessment  Pain Scale 0-10  Pain Score Asleep  MEWS Score  MEWS Temp 0  MEWS Systolic 0  MEWS Pulse 1  MEWS RR 1  MEWS LOC 0  MEWS Score 2

## 2019-11-26 NOTE — Progress Notes (Addendum)
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  --Marked hyperbilirubinemia, up to 16.8 today. CT scan suggesting cirrhosis. Findings of portal HTN with splenomegaly, esophageal varices, and ascites. Platelets 101.  -- INR 2.0.  Marked hypoalbuminemia, 1.4.  --Renal function okay. --HBV, HCV serologies negative. Holding off on checking for other etiologies of chronic liver disease for ( autoimmune, genetic, etc)  --Paracentesis yesterday, 820 cc was all that could be obtained. Fluid albumin < 1. . No organisms on gram stain. No evidence for SBP per cell count and fluid culture negative thus far.  --Will discontinue Rocephin. . .  --Evaluation for hepatic encephalopathy difficult in setting of IV ativan ( CIWA) and morphine for back pain. Patient very sleepy / lethargic compared to yesterday but suspect more medication related than HE.  Given cirrhosis he will have decreased drug clearance. Understandably he needs ativan per CIWA but would minimize use of opioids. Continue BID Lactulose. .  Orlan Leavens Score > 32 . No evidence for SBP or other infectious process she will start Prednisolone.  --At some point will need EGD to address esophageal varices seen on CT scan  --Obviously he will need to stop Etoh use going forward   # Abdominal distention. Approx 800 ml ascitic fluid removed yesterday ( all that was present). Today his abdomen is very tympanitic. Lactulose probably causing some gaseous distention but it needs to be continued.  .   # Leukocytosis --19.6, about the same as yesterday. Possibly from Etoh hepatitis.   # Macrocytic anemia.  --Baseline hgb 13-14, down to 7.4 now. Probably multifactorial ( nutritional deficits,  cirrhosis, bone marrow suppression).   # Diffuse small bowel wall and colon wall thickening on CT scan. Also gallbladder wall thickened  with pericholecystic fluid. In setting of ascites these finding are all possibly related to hypoalbuminemia / portal HTN.   # Etoh abuse --On CIWA protocol  # Vitamin D deficiency  # Tobacco abuse     SUBJECTIVE:   Excessive sleepiness, difficult to keep awake for more than a second or two.    OBJECTIVE:    Scheduled inpatient medications:  . lactulose  20 g Oral BID  . LORazepam  0-4 mg Intravenous Q4H   Followed by  . [START ON 11/28/2019] LORazepam  0-4 mg Intravenous Q8H  . potassium & sodium phosphates  1 packet Oral TID WC & HS  . thiamine  100 mg Oral Daily   Or  . thiamine  100 mg Intravenous Daily   Continuous inpatient infusions:  . cefTRIAXone (ROCEPHIN)  IV Stopped (11/26/19 0730)   PRN inpatient medications: LORazepam **OR** LORazepam, morphine injection, traMADol  Vital signs in last 24 hours: Temp:  [98.7 F (37.1 C)] 98.7 F (37.1 C) (10/22 1246) Pulse Rate:  [94-124] 109 (10/22 1246) Resp:  [16-24] 22 (10/22 1246) BP: (100-139)/(52-88) 111/77 (10/22 1246) SpO2:  [81 %-100 %] 98 % (10/22 1246)    Intake/Output Summary (Last 24 hours) at 11/26/2019 1403 Last data filed at 11/26/2019 0730 Gross per 24 hour  Intake 1100 ml  Output --  Net 1100 ml     Physical Exam:  . General: Excessive sleepiness. Cannot stay awake for more than a few seconds.  . Heart:  Regular rate and rhythm. No murmur. Pitting edema of BLE edema . Pulmonary: Normal respiratory effort . Abdomen: Soft,  moderately distended with tympany. Nontender. Normal bowel sounds.  . Neurologic: drowsy, minimal asterixis.   There were no vitals filed for this visit.  Intake/Output from previous day: 10/21 0701 - 10/22 0700 In: 1300 [I.V.:1000; IV Piggyback:300] Out: -  Intake/Output this shift: Total I/O In: 100 [IV Piggyback:100] Out: -     Lab Results: Recent Labs    11/25/19 0050 11/25/19 0843 11/26/19 0212  WBC 20.7* 20.1* 19.6*  HGB 8.3* 7.9* 7.4*  HCT 23.9*  22.2* 21.6*  PLT 126* 115* 101*   BMET Recent Labs    11/25/19 0050 11/25/19 0843 11/26/19 0212  NA 129* 130* 131*  K 2.7* 4.0 3.4*  CL 92* 93* 95*  CO2 25 25 25   GLUCOSE 112* 97 53*  BUN <5* <5* <5*  CREATININE 0.72 0.63 0.55*  CALCIUM 7.6* 7.3* 7.5*   LFT Recent Labs    11/26/19 0212  PROT 7.1  ALBUMIN 1.4*  AST 103*  ALT 26  ALKPHOS 261*  BILITOT 16.8*  BILIDIR 8.7*  IBILI 8.1*   PT/INR Recent Labs    11/25/19 0400  LABPROT 21.9*  INR 2.0*   Hepatitis Panel Recent Labs    11/25/19 0417 11/25/19 0417 11/25/19 0756  HEPBSAG NON REACTIVE   < > NON REACTIVE  HCVAB NON REACTIVE   < > NON REACTIVE  HEPAIGM NON REACTIVE  --   --   HEPBIGM NON REACTIVE  --   --    < > = values in this interval not displayed.    DG Chest 2 View  Result Date: 11/25/2019 CLINICAL DATA:  39 year old male with chest pain, shortness of breath. EXAM: CHEST - 2 VIEW COMPARISON:  Chest CT 03/05/2019 and earlier. FINDINGS: Lower lung volumes with small new bilateral pleural effusions. Hazy opacity in the visible upper abdomen suspicious for ascites. Visible bowel-gas pattern within normal limits. Mediastinal contours remain normal. No pneumothorax or pulmonary edema. Chronic right upper lobe scarring and architectural distortion is stable. Visualized tracheal air column is within normal limits. No other acute pulmonary opacity. No acute osseous abnormality identified. IMPRESSION: 1. Lower lung volumes with new small pleural effusions. 2. Possible ascites. 3. Right upper lobe lung scarring. Electronically Signed   By: 03/07/2019 M.D.   On: 11/25/2019 02:09   DG Thoracic Spine 2 View  Result Date: 11/25/2019 CLINICAL DATA:  Back pain EXAM: THORACIC SPINE 2 VIEWS COMPARISON:  03/02/2012 FINDINGS: There is no evidence of thoracic spine fracture. Alignment is normal. Intervertebral disc heights are preserved. No other significant bone abnormalities are identified. IMPRESSION: Negative.  Electronically Signed   By: 03/04/2012 D.O.   On: 11/25/2019 16:38   DG Lumbar Spine 2-3 Views  Result Date: 11/25/2019 CLINICAL DATA:  Back pain EXAM: LUMBAR SPINE - 2-3 VIEW COMPARISON:  03/02/2012 FINDINGS: There is no evidence of lumbar spine fracture. Alignment is normal. Intervertebral disc spaces are maintained. No apparent facet joint arthropathy. Excreted contrast present within the bladder. IMPRESSION: Negative. Electronically Signed   By: 03/04/2012 D.O.   On: 11/25/2019 16:37   CT ABDOMEN PELVIS W CONTRAST  Result Date: 11/25/2019 CLINICAL DATA:  Nausea, vomiting, jaundice, abdominal distention. EXAM: CT ABDOMEN AND PELVIS WITH CONTRAST TECHNIQUE: Multidetector CT imaging of the abdomen and pelvis was performed using the standard protocol following bolus administration of intravenous contrast. CONTRAST:  11/27/2019 OMNIPAQUE IOHEXOL 300 MG/ML  SOLN COMPARISON:  09/27/2017 FINDINGS: Lower chest: Atelectasis in the lung bases. Minimal bilateral effusions. Hepatobiliary: Diffuse fatty infiltration of  the liver. Enlarged lateral segment left lobe suggest cirrhosis. Portal veins are patent. Gallbladder wall is thickened and there is pericholecystic edema, likely related to liver disease and ascites. No stones identified. No bile duct dilatation. Pancreas: Unremarkable. No pancreatic ductal dilatation or surrounding inflammatory changes. Spleen: Spleen is enlarged.  No focal lesions. Adrenals/Urinary Tract: Adrenal glands are unremarkable. Kidneys are normal, without renal calculi, focal lesion, or hydronephrosis. Bladder is unremarkable. Stomach/Bowel: Stomach, small bowel, and colon are not abnormally distended. There is evidence of diffuse small bowel wall thickening and colonic wall thickening and edema. These changes may represent enterocolitis. Cirrhotic colopathy would also be a possible consideration. If the patient is on ACE inhibitors, this could also cause bowel wall thickening.  Vascular/Lymphatic: Normal caliber abdominal aorta. No significant lymphadenopathy. Upper abdominal varices and lower esophageal varices are present. Reproductive: Prostate is unremarkable. Other: Diffuse free fluid throughout the abdomen and pelvis consistent with ascites. Musculoskeletal: No acute or significant osseous findings. IMPRESSION: 1. Diffuse fatty infiltration of the liver.  Probable cirrhosis. 2. Portal venous hypertension with splenic enlargement, upper abdominal varices, and lower esophageal varices. 3. Diffuse small bowel and colonic wall thickening and edema may represent enterocolitis. Cirrhotic colopathy would also be a possible consideration. If the patient is on ACE inhibitors, this could also cause bowel wall thickening. 4. Diffuse free fluid throughout the abdomen and pelvis consistent with ascites. 5. Atelectasis in the lung bases with minimal bilateral effusions. Electronically Signed   By: Burman Nieves M.D.   On: 11/25/2019 06:00   IR Paracentesis  Result Date: 11/26/2019 INDICATION: Patient with history of alcoholic cirrhosis, jaundice, ascites. Request received for diagnostic and therapeutic paracentesis. EXAM: ULTRASOUND GUIDED DIAGNOSTIC AND THERAPEUTIC PARACENTESIS MEDICATIONS: 1% lidocaine to skin and subcutaneous tissue COMPLICATIONS: None immediate. PROCEDURE: Informed written consent was obtained from the patient after a discussion of the risks, benefits and alternatives to treatment. A timeout was performed prior to the initiation of the procedure. Initial ultrasound scanning demonstrates a small amount of ascites within the right lower abdominal quadrant. The right lower abdomen was prepped and draped in the usual sterile fashion. 1% lidocaine was used for local anesthesia. Following this, a 19 gauge, 7-cm, Yueh catheter was introduced. An ultrasound image was saved for documentation purposes. The paracentesis was performed. The catheter was removed and a dressing was  applied. The patient tolerated the procedure well without immediate post procedural complication. FINDINGS: A total of approximately 820 cc of clear, golden yellow fluid was removed. Samples were sent to the laboratory as requested by the clinical team. IMPRESSION: Successful ultrasound-guided diagnostic and therapeutic paracentesis yielding 820 cc of peritoneal fluid. Read by: Jeananne Rama, PA-C Electronically Signed   By: Gilmer Mor D.O.   On: 11/25/2019 16:10      Principal Problem:   Alcoholic hepatitis without ascites Active Problems:   Essential hypertension   Alcohol abuse   Jaundice due to hepatitis   Hypokalemia   Alcohol cessation counseling   Alcohol withdrawal (HCC)   Liver failure, acute     LOS: 0 days   Willette Cluster ,NP 11/26/2019, 2:03 PM  I have discussed the case with the PA, and that is the plan I formulated. I personally interviewed and examined the patient. His wife was present at the bedside when I saw him.  He remains a very sick man with acute alcoholic hepatitis on top of alcohol-related cirrhosis.  Bilirubin has mildly increased, but INR remains stable.  He is not showing signs of GI  bleeding, it is possible he is somewhat encephalopathic but is definitely in alcohol withdrawal.  Fortunately, his renal function remained stable.  No SBP on paracentesis yesterday, and less than a liter was able to be accessed and removed for therapeutic purposes.  I note that his glucose was 53 on this morning's labs and he is taking very little by mouth.  Therefore, I put him on D5W at 100 cc an hour overnight and message to the hospitalist about this.  I will put him on low-dose diuretics of Aldactone 50 mg once daily and Lasix 20 mg daily to hopefully decrease some of the ascites without stressing his kidneys if possible.  We have also started prednisolone 40 mg once a day for his alcoholic hepatitis with high discriminant function.  We will follow him throughout  the hospital stay, and I am on call for the weekend.   35 minutes were spent on this encounter (including chart review, history/exam, counseling/coordination of care, and documentation)    Charlie PitterHenry L Danis III Office: [phone]

## 2019-11-26 NOTE — ED Notes (Signed)
Ordered breakfast--Zachary Mata 

## 2019-11-27 ENCOUNTER — Other Ambulatory Visit: Payer: Self-pay

## 2019-11-27 ENCOUNTER — Inpatient Hospital Stay (HOSPITAL_COMMUNITY): Payer: Commercial Managed Care - PPO

## 2019-11-27 DIAGNOSIS — D689 Coagulation defect, unspecified: Secondary | ICD-10-CM

## 2019-11-27 DIAGNOSIS — K7290 Hepatic failure, unspecified without coma: Secondary | ICD-10-CM

## 2019-11-27 DIAGNOSIS — K759 Inflammatory liver disease, unspecified: Secondary | ICD-10-CM | POA: Diagnosis not present

## 2019-11-27 DIAGNOSIS — K7031 Alcoholic cirrhosis of liver with ascites: Secondary | ICD-10-CM | POA: Diagnosis not present

## 2019-11-27 DIAGNOSIS — F1010 Alcohol abuse, uncomplicated: Secondary | ICD-10-CM | POA: Diagnosis not present

## 2019-11-27 DIAGNOSIS — K7010 Alcoholic hepatitis without ascites: Secondary | ICD-10-CM | POA: Diagnosis not present

## 2019-11-27 LAB — COMPREHENSIVE METABOLIC PANEL
ALT: 26 U/L (ref 0–44)
AST: 93 U/L — ABNORMAL HIGH (ref 15–41)
Albumin: 1.2 g/dL — ABNORMAL LOW (ref 3.5–5.0)
Alkaline Phosphatase: 227 U/L — ABNORMAL HIGH (ref 38–126)
Anion gap: 9 (ref 5–15)
BUN: 6 mg/dL (ref 6–20)
CO2: 26 mmol/L (ref 22–32)
Calcium: 7.8 mg/dL — ABNORMAL LOW (ref 8.9–10.3)
Chloride: 96 mmol/L — ABNORMAL LOW (ref 98–111)
Creatinine, Ser: 0.72 mg/dL (ref 0.61–1.24)
GFR, Estimated: >60 mL/min (ref >60)
Glucose, Bld: 189 mg/dL — ABNORMAL HIGH (ref 70–99)
Potassium: 3.5 mmol/L (ref 3.5–5.1)
Sodium: 131 mmol/L — ABNORMAL LOW (ref 135–145)
Total Bilirubin: 20.7 mg/dL (ref 0.3–1.2)
Total Protein: 7.1 g/dL (ref 6.5–8.1)

## 2019-11-27 LAB — URINALYSIS, ROUTINE W REFLEX MICROSCOPIC
Glucose, UA: NEGATIVE mg/dL
Hgb urine dipstick: NEGATIVE
Ketones, ur: NEGATIVE mg/dL
Leukocytes,Ua: NEGATIVE
Nitrite: NEGATIVE
Protein, ur: NEGATIVE mg/dL
Specific Gravity, Urine: 1.01 (ref 1.005–1.030)
pH: 7 (ref 5.0–8.0)

## 2019-11-27 LAB — CBC
HCT: 21.5 % — ABNORMAL LOW (ref 39.0–52.0)
Hemoglobin: 7.4 g/dL — ABNORMAL LOW (ref 13.0–17.0)
MCH: 40 pg — ABNORMAL HIGH (ref 26.0–34.0)
MCHC: 34.4 g/dL (ref 30.0–36.0)
MCV: 116.2 fL — ABNORMAL HIGH (ref 80.0–100.0)
Platelets: 103 10*3/uL — ABNORMAL LOW (ref 150–400)
RBC: 1.85 MIL/uL — ABNORMAL LOW (ref 4.22–5.81)
RDW: 14.1 % (ref 11.5–15.5)
WBC: 23 10*3/uL — ABNORMAL HIGH (ref 4.0–10.5)
nRBC: 0 % (ref 0.0–0.2)

## 2019-11-27 LAB — AMMONIA: Ammonia: 79 umol/L — ABNORMAL HIGH (ref 9–35)

## 2019-11-27 LAB — PROTIME-INR
INR: 2.4 — ABNORMAL HIGH (ref 0.8–1.2)
Prothrombin Time: 25 seconds — ABNORMAL HIGH (ref 11.4–15.2)

## 2019-11-27 IMAGING — DX DG CHEST 2V
2 series · 2 of 2 positions shown · non-contrast
Comparison: [DATE]

CLINICAL DATA: Chest pain.

EXAM:
CHEST - 2 VIEW

[chest lat]
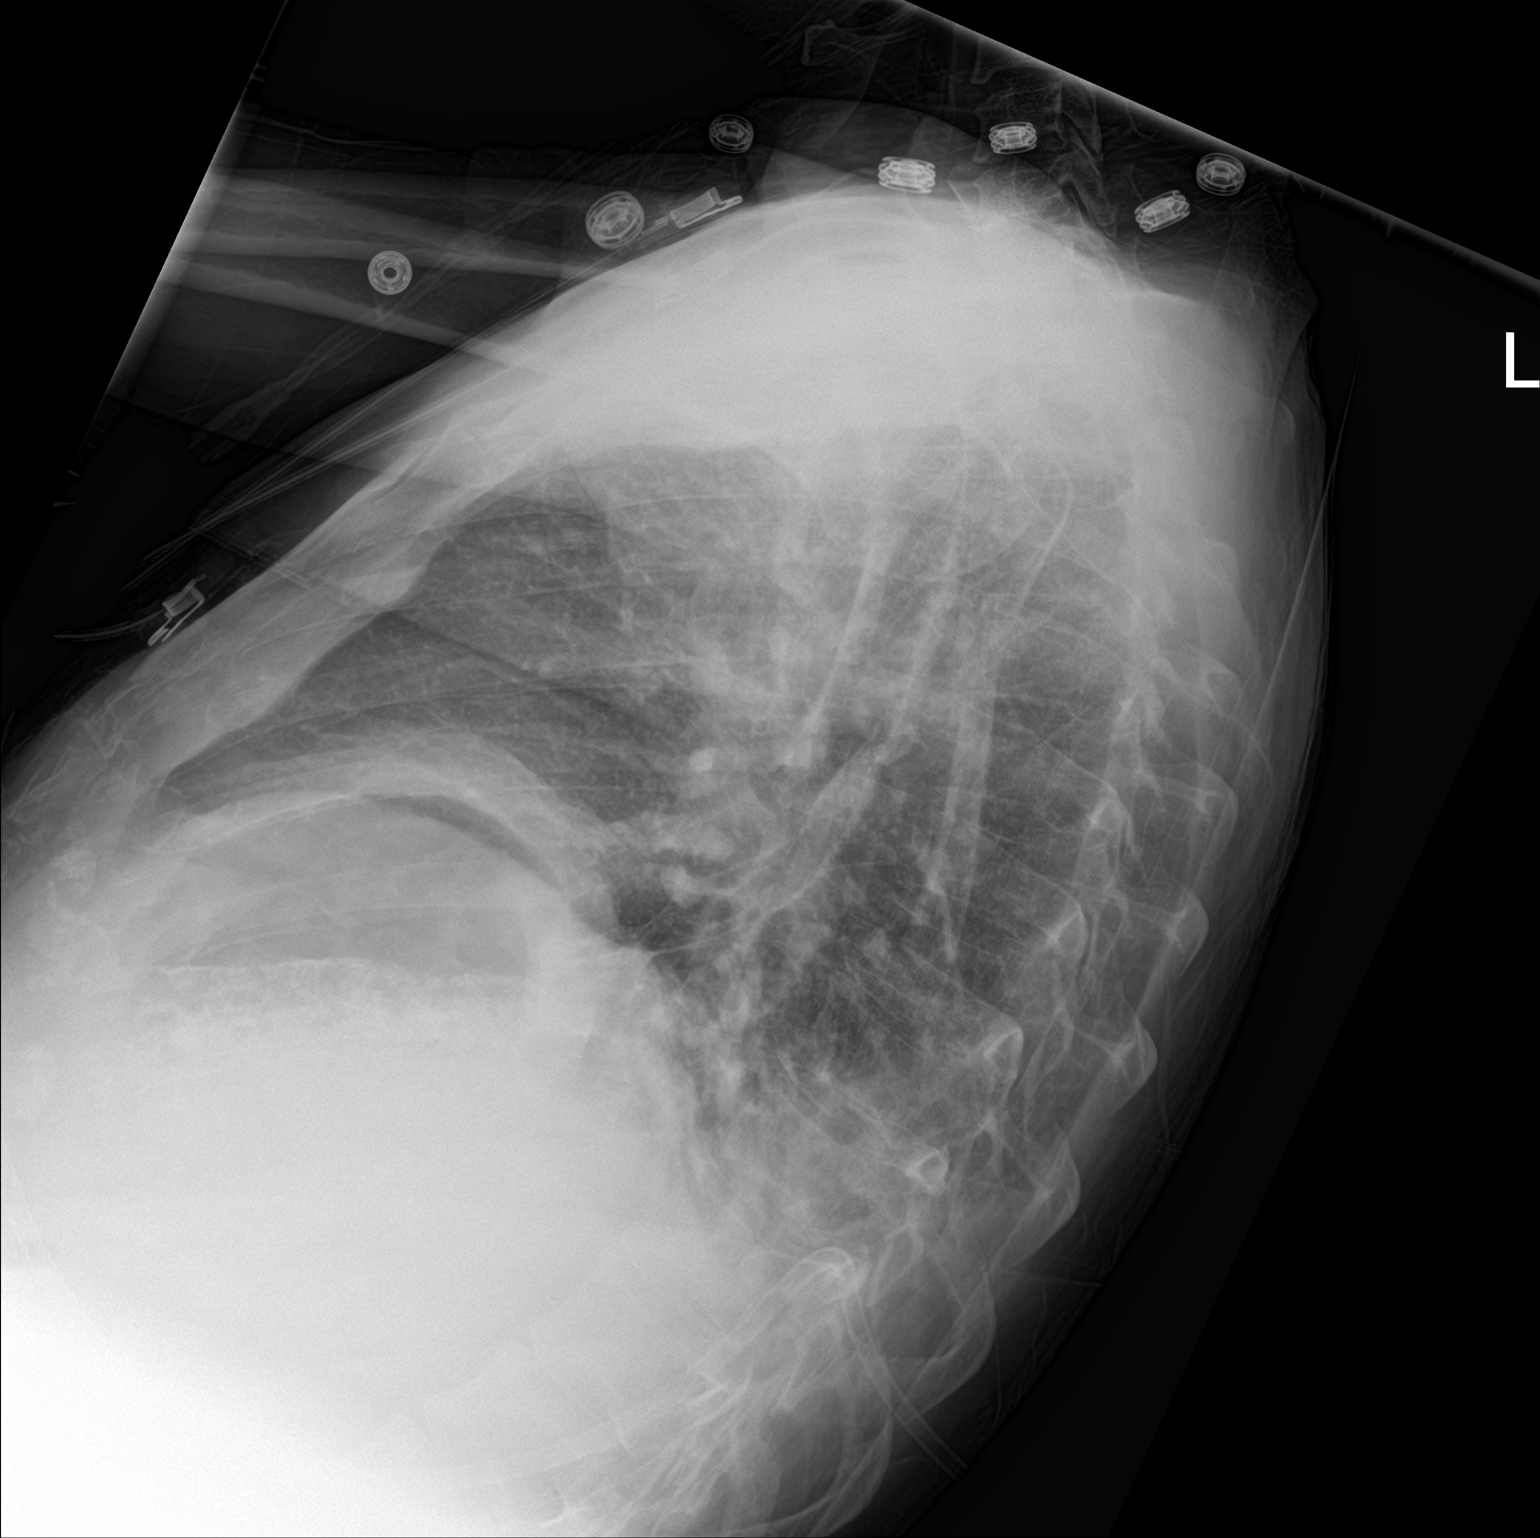

[chest ap]
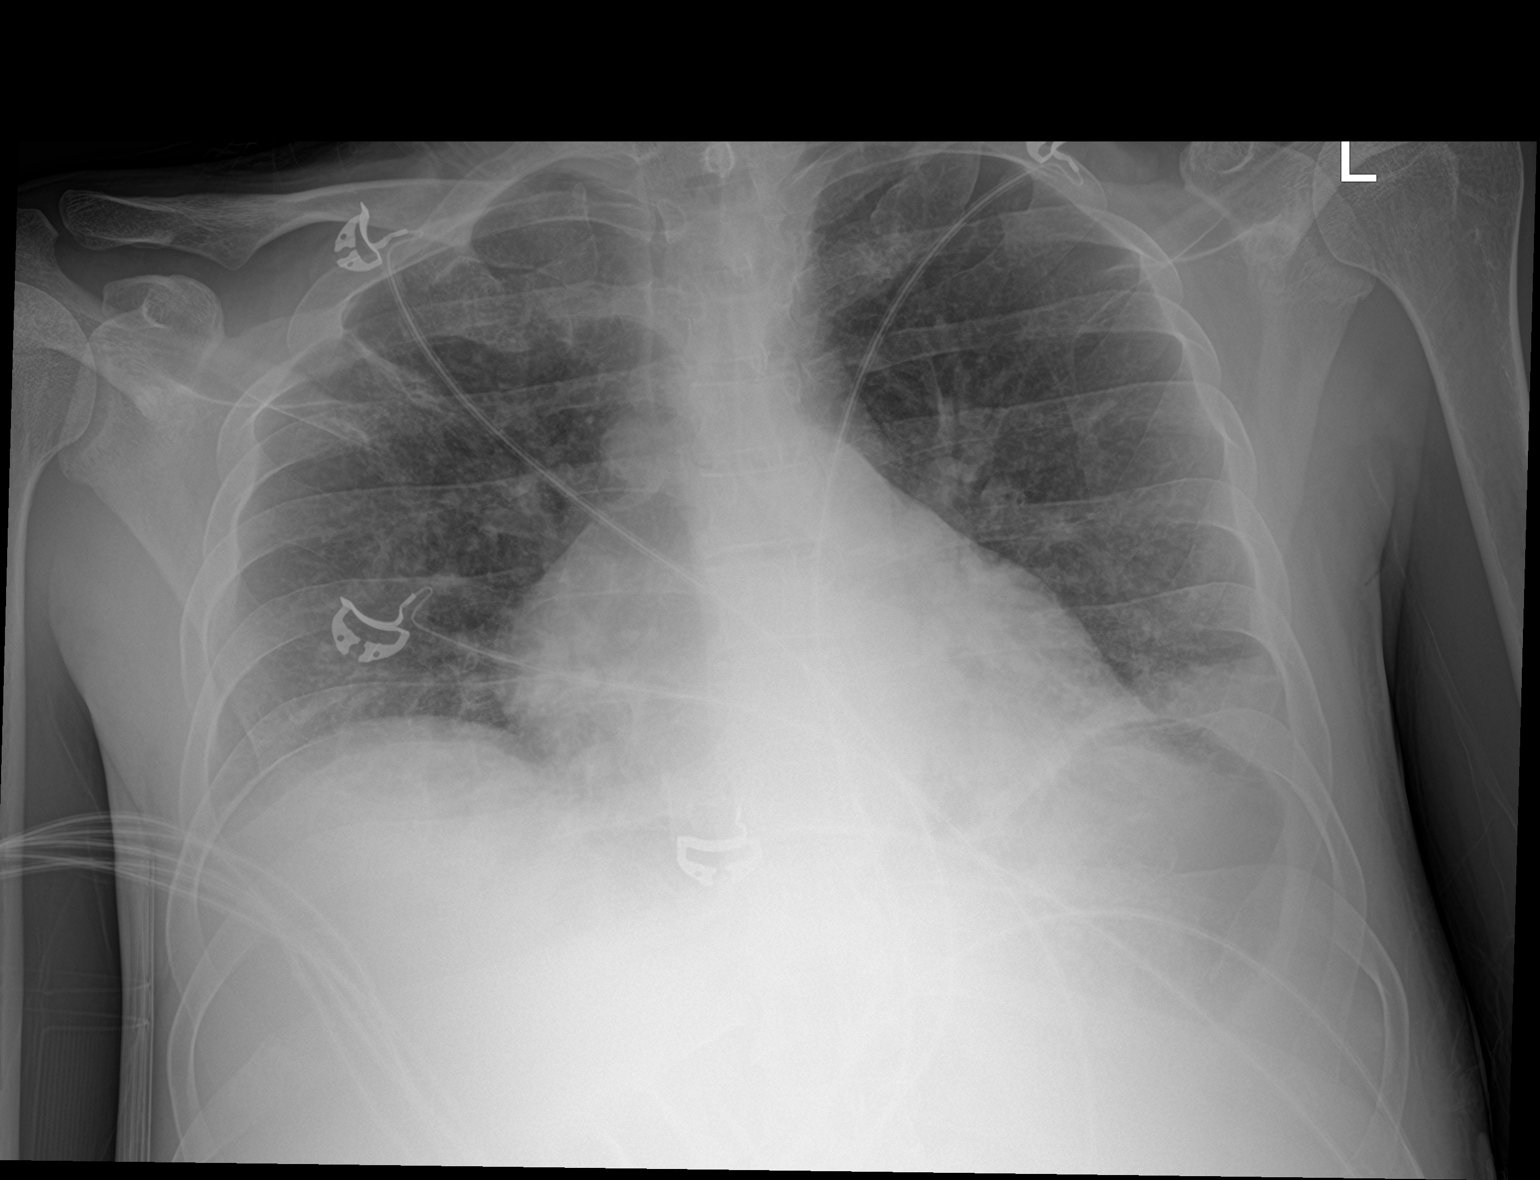

[2 of 2 positions shown; findings below may reference images not displayed]

FINDINGS: Low lung volumes. Layering small left pleural effusion with
overlying left basilar opacities. Right upper lobe scarring. No
discernible pneumothorax. Cardiomediastinal silhouette is
accentuated by low lung volumes. No acute osseous abnormality.
IMPRESSION: Layering small left pleural effusion with overlying left basilar
opacities, concerning for aspiration and/or pneumonia.

## 2019-11-27 MED ORDER — MORPHINE SULFATE (PF) 2 MG/ML IV SOLN
0.5000 mg | INTRAVENOUS | Status: DC | PRN
  Administered 2019-11-27 – 2019-12-04 (×17): 0.5 mg via INTRAVENOUS
  Filled 2019-11-27 (×17): qty 1

## 2019-11-27 NOTE — Progress Notes (Signed)
PROGRESS NOTE    Zachary Mata  OAC:[id number] DOB: 1980/12/27 DOA: 11/25/2019 PCP: Monica Becton, MD    Chief Complaint  Patient presents with  . Abdominal Pain  . Chest Pain    Brief Narrative:   39 year old gentleman with prior history of hypertension, daily alcohol abuse presents to ED with jaundice, abdominal distention, back pain. He was admitted for acute decompensated liver failure secondary to EtOH abuse associated with hyperbilirubinemia, portal hypertension, splenomegaly, esophageal varices, ascites. GI consulted for further evaluation.  Diagnostic paracentesis with fluid studies have been ordered.  Patient was started empirically on IV Rocephin for possible SBP. Th cultures from the ascitic fluid negative so far. Pt seen and examined at bedside. Pt lethargic, and RN reports she gave him morphine just now.    Assessment & Plan:   Principal Problem:   Alcoholic hepatitis without ascites Active Problems:   Essential hypertension   Alcohol abuse   Jaundice due to hepatitis   Hypokalemia   Alcohol cessation counseling   Alcohol withdrawal (HCC)   Liver failure, acute   Liver cirrhosis with decompensated liver failure Associated with EtOH hepatitis, CT of the abdomen pelvis shows liver cirrhosis, splenomegaly, esophageal varices, portal hypertension and ascites. GI consulted recommended ultrasound paracentesis and sending the ascitic fluid for analysis and culture. Patient started on empiric Rocephin for possible SBP. So far all cultures have been negative.  Patient was started on lactulose by gastroenterology. Recheck ammonia level today.  Maddrey discriminant function is 44, steroids started.     Ascites:  S/p diagnostic paracentesis, cultures negative.  On low dose lasix and spironolactone for diuresis.    Small bowel and colon wall thickening Incidental finding on CT abdomen.  Pt currently is not nauseated or vomiting. Wife at bedside reported  vomiting prior to admission.  No nausea or vomiting today. Pt requesting to advance diet.will change to regular diet.    Essential Hypertension;  Well controlled.    Hypokalemia: replaced.    Alcohol abuse:  TOC consulted for resources.  On CIWA protocol for alcohol withdrawal symptoms.     Anemia or thrombocytopenia:  Probably secondary to liver cirrhosis.  Platelet count stable around 103.    Leukocytosis:  Unclear etiology, worsening , but cultures have been negative.  Will get UA and CXR.    Hypoalbuminemia:  From liver cirrhosis.     Esophageal varices:  Will probably need an EGD in the future.    Hyponatremia:  Probably from hypervolemic hyponatremia.   Acute hepatic encephalopathy:  Will check ammonia levels.    DVT prophylaxis:scd's Code Status: (FULL CODE Family Communication: Family at bedside.  Disposition:   Status is: Observation  The patient will require care spanning > 2 midnights and should be moved to inpatient because: Hemodynamically unstable, Ongoing diagnostic testing needed not appropriate for outpatient work up, Unsafe d/c plan, IV treatments appropriate due to intensity of illness or inability to take PO and Inpatient level of care appropriate due to severity of illness  Dispo: The patient is from: Home              Anticipated d/c is to: pending              Anticipated d/c date is: > 3 days              Patient currently is not medically stable to d/c.       Consultants:   Gastroenterology    Procedures; Paracentesis about 820 ml fluid  taken off.   Antimicrobials: rocephin sine admission   Subjective: Pt lethargic, decreased the dose of morphine to 0.5 mg   Objective: Vitals:   11/27/19 0333 11/27/19 0736 11/27/19 1104 11/27/19 1257  BP: 123/81 106/70 121/80 116/80  Pulse: 91 87 94 89  Resp: 13 13 14 16   Temp: 98.5 F (36.9 C) 98.2 F (36.8 C)  98.6 F (37 C)  TempSrc: Oral Oral  Oral  SpO2: 100% 99% 94%  95%    Intake/Output Summary (Last 24 hours) at 11/27/2019 1415 Last data filed at 11/27/2019 1305 Gross per 24 hour  Intake --  Output 2675 ml  Net -2675 ml   There were no vitals filed for this visit.  Examination:  General exam:  Jaundiced, not in distress. Ill appearing.  Respiratory system: air entry fair, no wheezing heard.  Cardiovascular system: S1s2 RRR, no JVD, pedal edema improving.  Gastrointestinal system: Abdomen is firm, distended, mild tenderness on the RUQ, Bowel sounds minimal.   Central nervous system: lethargic, would wake up briefly to answer questions and go back to sleep Extremities: leg edema present,  Skin: spider naevi present.  Psychiatry:  Couldn't be assessed.     Data Reviewed: I have personally reviewed following labs and imaging studies  CBC: Recent Labs  Lab 11/25/19 0050 11/25/19 0843 11/26/19 0212 11/27/19 0226  WBC 20.7* 20.1* 19.6* 23.0*  NEUTROABS  --   --  15.8*  --   HGB 8.3* 7.9* 7.4* 7.4*  HCT 23.9* 22.2* 21.6* 21.5*  MCV 113.8* 114.4* 116.8* 116.2*  PLT 126* 115* 101* 103*    Basic Metabolic Panel: Recent Labs  Lab 11/25/19 0050 11/25/19 0417 11/25/19 0843 11/26/19 0212 11/27/19 0226  NA 129*  --  130* 131* 131*  K 2.7*  --  4.0 3.4* 3.5  CL 92*  --  93* 95* 96*  CO2 25  --  25 25 26   GLUCOSE 112*  --  97 53* 189*  BUN <5*  --  <5* <5* 6  CREATININE 0.72  --  0.63 0.55* 0.72  CALCIUM 7.6*  --  7.3* 7.5* 7.8*  MG  --  1.7  --   --   --   PHOS  --   --  4.4  --   --     GFR: CrCl cannot be calculated (Unknown ideal weight.).  Liver Function Tests: Recent Labs  Lab 11/25/19 0050 11/25/19 0843 11/26/19 0212 11/27/19 0226  AST 103* 109* 103* 93*  ALT 28 29 26 26   ALKPHOS 301* 267* 261* 227*  BILITOT 16.6* 15.5* 16.8* 20.7*  PROT 7.6 7.2 7.1 7.1  ALBUMIN 1.5* 1.4* 1.4* 1.2*    CBG: No results for input(s): GLUCAP in the last 168 hours.   Recent Results (from the past 240 hour(s))  Respiratory  Panel by RT PCR (Flu A&B, Covid) - Nasopharyngeal Swab     Status: None   Collection Time: 11/25/19  4:24 AM   Specimen: Nasopharyngeal Swab  Result Value Ref Range Status   SARS Coronavirus 2 by RT PCR NEGATIVE NEGATIVE Final    Comment: (NOTE) SARS-CoV-2 target nucleic acids are NOT DETECTED.  The SARS-CoV-2 RNA is generally detectable in upper respiratoy specimens during the acute phase of infection. The lowest concentration of SARS-CoV-2 viral copies this assay can detect is 131 copies/mL. A negative result does not preclude SARS-Cov-2 infection and should not be used as the sole basis for treatment or other patient management decisions. A negative result  may occur with  improper specimen collection/handling, submission of specimen other than nasopharyngeal swab, presence of viral mutation(s) within the areas targeted by this assay, and inadequate number of viral copies (<131 copies/mL). A negative result must be combined with clinical observations, patient history, and epidemiological information. The expected result is Negative.  Fact Sheet for Patients:  https://www.moore.com/  Fact Sheet for Healthcare Providers:  https://www.young.biz/  This test is no t yet approved or cleared by the [place] FDA and  has been authorized for detection and/or diagnosis of SARS-CoV-2 by FDA under an Emergency Use Authorization (EUA). This EUA will remain  in effect (meaning this test can be used) for the duration of the COVID-19 declaration under Section 564(b)(1) of the Act, 21 U.S.C. section 360bbb-3(b)(1), unless the authorization is terminated or revoked sooner.     Influenza A by PCR NEGATIVE NEGATIVE Final   Influenza B by PCR NEGATIVE NEGATIVE Final    Comment: (NOTE) The Xpert Xpress SARS-CoV-2/FLU/RSV assay is intended as an aid in  the diagnosis of influenza from Nasopharyngeal swab specimens and  should not be used as a sole basis  for treatment. Nasal washings and  aspirates are unacceptable for Xpert Xpress SARS-CoV-2/FLU/RSV  testing.  Fact Sheet for Patients: https://www.moore.com/  Fact Sheet for Healthcare Providers: https://www.young.biz/  This test is not yet approved or cleared by the [place] FDA and  has been authorized for detection and/or diagnosis of SARS-CoV-2 by  FDA under an Emergency Use Authorization (EUA). This EUA will remain  in effect (meaning this test can be used) for the duration of the  Covid-19 declaration under Section 564(b)(1) of the Act, 21  U.S.C. section 360bbb-3(b)(1), unless the authorization is  terminated or revoked. Performed at [facility], [address] [place], [place] [address]   Gram stain     Status: None   Collection Time: 11/25/19  4:17 PM   Specimen: Abdomen; Peritoneal Fluid  Result Value Ref Range Status   Specimen Description FLUID PERITONEAL ABDOMEN  Final   Special Requests NONE  Final   Gram Stain   Final    WBC PRESENT, PREDOMINANTLY MONONUCLEAR RED BLOOD CELLS PRESENT NO ORGANISMS SEEN CYTOSPIN SMEAR Performed at [facility], [address], [place], [place] [address]    Report Status 11/25/2019 FINAL  Final  Culture, body fluid-bottle     Status: None (Preliminary result)   Collection Time: 11/25/19  4:17 PM   Specimen: Fluid  Result Value Ref Range Status   Specimen Description FLUID PERITONEAL ABDOMEN  Final   Special Requests BOTTLES DRAWN AEROBIC AND ANAEROBIC  Final   Culture   Final    NO GROWTH 2 DAYS Performed at [facility], [address] [place], [place] [address]    Report Status PENDING  Incomplete         Radiology Studies: DG Thoracic Spine 2 View  Result Date: 11/25/2019 CLINICAL DATA:  Back pain EXAM: THORACIC SPINE 2 VIEWS COMPARISON:  03/02/2012 FINDINGS: There is no evidence of thoracic spine fracture. Alignment is normal. Intervertebral disc  heights are preserved. No other significant bone abnormalities are identified. IMPRESSION: Negative. Electronically Signed   By: Duanne Guess D.O.   On: 11/25/2019 16:38   DG Lumbar Spine 2-3 Views  Result Date: 11/25/2019 CLINICAL DATA:  Back pain EXAM: LUMBAR SPINE - 2-3 VIEW COMPARISON:  03/02/2012 FINDINGS: There is no evidence of lumbar spine fracture. Alignment is normal. Intervertebral disc spaces are maintained.  No apparent facet joint arthropathy. Excreted contrast present within the bladder. IMPRESSION: Negative. Electronically Signed   By: Duanne Guess D.O.   On: 11/25/2019 16:37   IR Paracentesis  Result Date: 11/26/2019 INDICATION: Patient with history of alcoholic cirrhosis, jaundice, ascites. Request received for diagnostic and therapeutic paracentesis. EXAM: ULTRASOUND GUIDED DIAGNOSTIC AND THERAPEUTIC PARACENTESIS MEDICATIONS: 1% lidocaine to skin and subcutaneous tissue COMPLICATIONS: None immediate. PROCEDURE: Informed written consent was obtained from the patient after a discussion of the risks, benefits and alternatives to treatment. A timeout was performed prior to the initiation of the procedure. Initial ultrasound scanning demonstrates a small amount of ascites within the right lower abdominal quadrant. The right lower abdomen was prepped and draped in the usual sterile fashion. 1% lidocaine was used for local anesthesia. Following this, a 19 gauge, 7-cm, Yueh catheter was introduced. An ultrasound image was saved for documentation purposes. The paracentesis was performed. The catheter was removed and a dressing was applied. The patient tolerated the procedure well without immediate post procedural complication. FINDINGS: A total of approximately 820 cc of clear, golden yellow fluid was removed. Samples were sent to the laboratory as requested by the clinical team. IMPRESSION: Successful ultrasound-guided diagnostic and therapeutic paracentesis yielding 820 cc of peritoneal  fluid. Read by: Jeananne Rama, PA-C Electronically Signed   By: Gilmer Mor D.O.   On: 11/25/2019 16:10        Scheduled Meds: . furosemide  20 mg Oral Daily  . lactulose  20 g Oral BID  . LORazepam  0-4 mg Intravenous Q4H   Followed by  . [START ON 11/28/2019] LORazepam  0-4 mg Intravenous Q8H  . nicotine  21 mg Transdermal Daily  . potassium & sodium phosphates  1 packet Oral TID WC & HS  . prednisoLONE  40 mg Oral Daily  . spironolactone  50 mg Oral Daily  . thiamine  100 mg Oral Daily   Or  . thiamine  100 mg Intravenous Daily   Continuous Infusions: . dextrose 100 mL/hr at 11/27/19 1208     LOS: 1 day     Kathlen Mody, MD [facility]   To contact the attending provider between 7A-7P or the covering provider during after hours 7P-7A, please log into the web site [email or web address] and access using universal [place] password for that web site. If you do not have the password, please call the hospital operator.  11/27/2019, 2:15 PM

## 2019-11-27 NOTE — Progress Notes (Addendum)
[facility] Progress Note  CC:  EtOH hepatitis, cirrhosis, elevated bilirubin   Subjective: He complains of bilateral flank, back and lower abdominal pain. No N/V. He is tolerating solid food. He passed one loose green/brown stool earlier today. No rectal bleeding or melena. He is fatigued. His RN Amy reported his orientation comes and goes throughout the day. His wife is at the bedside.   He denies chest pain or dyspnea. He feels "swollen". Objective:  Vital signs in last 24 hours: Temp:  [97.7 F (36.5 C)-98.6 F (37 C)] 98.2 F (36.8 C) (10/23 1545) Pulse Rate:  [86-106] 86 (10/23 1545) Resp:  [13-18] 13 (10/23 1545) BP: (106-123)/(69-82) 117/69 (10/23 1545) SpO2:  [93 %-100 %] 97 % (10/23 1545) Last BM Date: 11/26/19 General:  Ill appearing 39 year old male in no acute distress.  Eyes: Moderate scleral icterus.  Heart: RRR. Systolic murmur, possible flow murmur.  Pulm:  Breath sounds clear throughout.  Abdomen: Distended with ascites, soft. Mild lower abdominal tenderness without rebound or guarding. + BS x 4 quads. No obvious HSM in setting of distended abdomen.  Extremities: Trace LE edema.  Neurologic:  Alert to name, date and person at this time. Delayed response to questions. Moves all extremities weakly. No asterixis.  Psych:  Alert and cooperative.  Skin: Jaundice.  Intake/Output from previous day: 10/22 0701 - 10/23 0700 In: 100 [IV Piggyback:100] Out: 1575 [Urine:1575] Intake/Output this shift: Total I/O In: -  Out: 1100 [Urine:1100]  Lab Results: Recent Labs    11/25/19 0843 11/26/19 0212 11/27/19 0226  WBC 20.1* 19.6* 23.0*  HGB 7.9* 7.4* 7.4*  HCT 22.2* 21.6* 21.5*  PLT 115* 101* 103*   BMET Recent Labs    11/25/19 0843 11/26/19 0212 11/27/19 0226  NA 130* 131* 131*  K 4.0 3.4* 3.5  CL 93* 95* 96*  CO2 $Re'25 25 26  'Nxi$ GLUCOSE 97 53* 189*  BUN <5* <5* 6  CREATININE 0.63 0.55* 0.72  CALCIUM 7.3* 7.5* 7.8*   LFT Recent Labs      11/26/19 0212 11/26/19 0212 11/27/19 0226  PROT 7.1   < > 7.1  ALBUMIN 1.4*   < > 1.2*  AST 103*   < > 93*  ALT 26   < > 26  ALKPHOS 261*   < > 227*  BILITOT 16.8*   < > 20.7*  BILIDIR 8.7*  --   --   IBILI 8.1*  --   --    < > = values in this interval not displayed.   PT/INR Recent Labs    11/26/19 1408 11/27/19 0226  LABPROT 23.0* 25.0*  INR 2.1* 2.4*   Hepatitis Panel Recent Labs    11/25/19 0417 11/25/19 0417 11/25/19 0756  HEPBSAG NON REACTIVE   < > NON REACTIVE  HCVAB NON REACTIVE   < > NON REACTIVE  HEPAIGM NON REACTIVE  --   --   HEPBIGM NON REACTIVE  --   --    < > = values in this interval not displayed.    DG Thoracic Spine 2 View  Result Date: 11/25/2019 CLINICAL DATA:  Back pain EXAM: THORACIC SPINE 2 VIEWS COMPARISON:  03/02/2012 FINDINGS: There is no evidence of thoracic spine fracture. Alignment is normal. Intervertebral disc heights are preserved. No other significant bone abnormalities are identified. IMPRESSION: Negative. Electronically Signed   By: Davina Poke D.O.   On: 11/25/2019 16:38   DG Lumbar Spine 2-3 Views  Result Date: 11/25/2019  CLINICAL DATA:  Back pain EXAM: LUMBAR SPINE - 2-3 VIEW COMPARISON:  03/02/2012 FINDINGS: There is no evidence of lumbar spine fracture. Alignment is normal. Intervertebral disc spaces are maintained. No apparent facet joint arthropathy. Excreted contrast present within the bladder. IMPRESSION: Negative. Electronically Signed   By: Davina Poke D.O.   On: 11/25/2019 16:37   IR Paracentesis  Result Date: 11/26/2019 INDICATION: Patient with history of alcoholic cirrhosis, jaundice, ascites. Request received for diagnostic and therapeutic paracentesis. EXAM: ULTRASOUND GUIDED DIAGNOSTIC AND THERAPEUTIC PARACENTESIS MEDICATIONS: 1% lidocaine to skin and subcutaneous tissue COMPLICATIONS: None immediate. PROCEDURE: Informed written consent was obtained from the patient after a discussion of the risks,  benefits and alternatives to treatment. A timeout was performed prior to the initiation of the procedure. Initial ultrasound scanning demonstrates a small amount of ascites within the right lower abdominal quadrant. The right lower abdomen was prepped and draped in the usual sterile fashion. 1% lidocaine was used for local anesthesia. Following this, a 19 gauge, 7-cm, Yueh catheter was introduced. An ultrasound image was saved for documentation purposes. The paracentesis was performed. The catheter was removed and a dressing was applied. The patient tolerated the procedure well without immediate post procedural complication. FINDINGS: A total of approximately 820 cc of clear, golden yellow fluid was removed. Samples were sent to the laboratory as requested by the clinical team. IMPRESSION: Successful ultrasound-guided diagnostic and therapeutic paracentesis yielding 820 cc of peritoneal fluid. Read by: Rowe Robert, PA-C Electronically Signed   By: Corrie Mckusick D.O.   On: 11/25/2019 16:10    Assessment / Plan:  62. 39 year old male with EtOH hepatitis and newly diagnosed decompensated cirrhosis. CTAP 10/21 showed diffuse fatty liver, probable cirrhosis, portal HTN, splenomegaly, esophageal varices and ascites. S/P 10/21,  820 cc of peritoneal fluid was removed without evidence of SBP. Cytology pending. Cx no growth 2 days. MDF > 32. Prednisolone $RemoveBeforeDE'40mg'[id number]$  QD was started on 10/22.  Negative acute hepatitis panel. He has hyponatremia with normal renal function.  LFTs stable with rising T. Bili. Alk phos 277.  AST 103 -> 93.  ALT 26.  Total bili 16.8 ->20.7. -Continue Prednisolone, today is day # 2. Check Lille score on day # 7 -Continue Furosemide 20 mg daily and Spironolactone 50 mg p.o. daily -Consider repeat abdominal sonogram tomorow to verify if enough fluid for paracentesis -Repeat hepatic panel in am. IgG. ANA. AMA. SMA. Ceruloplasmin.   2. Hepatic encephalopathy in setting of alcohol withdrawal. Ammonia  79. -CIWA protocol maintain -Continue Lactulose -Monitor neuro status closely  3.  Macrocytic anemia secondary to #1.  Hemoglobin stable at 7.4.  Hematocrit 21.5.  MCV 116.2. No signs of active GI bleeding. -CBC in am  4.  Thrombocytopenia secondary to #1.  Platelet 103.  5.  Coagulopathy secondary to #1.  Rising INR 2.1 -> 2.4. -Consider Vitamin K, defer decision to  Dr. Loletha Carrow  -INR/PT in AM  6.  Leukocytosis secondary to alcoholic hepatitis and Prednisolone  7. CTAP 10/21 showed diffuse small bowel and colonic wall thickening and edema may represent enterocolitis or cirrhotic colopathy. Loose stools on Lactulose.      Principal Problem:   Alcoholic hepatitis without ascites Active Problems:   Essential hypertension   Alcohol abuse   Jaundice due to hepatitis   Hypokalemia   Alcohol cessation counseling   Alcohol withdrawal ([place])   Liver failure, acute     LOS: 1 day   Noralyn Pick  11/27/2019, 4:00 PM  I have discussed  the case with the PA, and that is the plan I formulated. I personally interviewed and examined the patient. His wife was at the bedside for the entire visit, and is understandably concerned about him.  He appears to have gone through the worst of alcohol withdrawal, mental status clearer today but still reportedly confused at times. His speech is slow he does not recall seeing me before.  He is noticeably more jaundiced and icteric than he was on admission. Fortunately renal function remains good and urine output good.  I am concerned that his bilirubin and INR have increased. However, I do not think we can do more about that right now than supportive care and prednisolone. He is unquestionably not a liver transplant candidate due to his alcohol abuse.  If renal function remains good tomorrow, I will make a modest dose increase in his diuretics.  If he still has no improvement in ascites by Monday, we may send him now for another attempt at  repeat therapeutic paracentesis of 2 to 3 L maximum to give him some relief.  The D5W fluids I started last evening to run overnight are still going, so I discontinued them. Glucose has improved since he is more awake and able to eat.  He remains a very sick man, we will follow along with you, and he will be here several days more for sure.    35 minutes were spent on this encounter (including chart review, history/exam, counseling/coordination of care, and documentation)  Nelida Meuse III Office: [phone]

## 2019-11-27 NOTE — Progress Notes (Signed)
CRITICAL VALUE ALERT  Critical Value:  Bilirubin 20.7  Date & Time Notied:  0333  Provider Notified: MD T. OPYD  Orders Received/Actions taken: MD notified.

## 2019-11-28 DIAGNOSIS — K7031 Alcoholic cirrhosis of liver with ascites: Secondary | ICD-10-CM | POA: Diagnosis not present

## 2019-11-28 DIAGNOSIS — K759 Inflammatory liver disease, unspecified: Secondary | ICD-10-CM | POA: Diagnosis not present

## 2019-11-28 DIAGNOSIS — K7290 Hepatic failure, unspecified without coma: Secondary | ICD-10-CM | POA: Diagnosis not present

## 2019-11-28 DIAGNOSIS — K7010 Alcoholic hepatitis without ascites: Secondary | ICD-10-CM | POA: Diagnosis not present

## 2019-11-28 DIAGNOSIS — F1010 Alcohol abuse, uncomplicated: Secondary | ICD-10-CM | POA: Diagnosis not present

## 2019-11-28 LAB — CBC
HCT: 21.4 % — ABNORMAL LOW (ref 39.0–52.0)
Hemoglobin: 7.5 g/dL — ABNORMAL LOW (ref 13.0–17.0)
MCH: 40.5 pg — ABNORMAL HIGH (ref 26.0–34.0)
MCHC: 35 g/dL (ref 30.0–36.0)
MCV: 115.7 fL — ABNORMAL HIGH (ref 80.0–100.0)
Platelets: 125 10*3/uL — ABNORMAL LOW (ref 150–400)
RBC: 1.85 MIL/uL — ABNORMAL LOW (ref 4.22–5.81)
RDW: 13.6 % (ref 11.5–15.5)
WBC: 25.6 10*3/uL — ABNORMAL HIGH (ref 4.0–10.5)
nRBC: 0 % (ref 0.0–0.2)

## 2019-11-28 LAB — FERRITIN: Ferritin: 1173 ng/mL — ABNORMAL HIGH (ref 24–336)

## 2019-11-28 LAB — COMPREHENSIVE METABOLIC PANEL
ALT: 26 U/L (ref 0–44)
AST: 70 U/L — ABNORMAL HIGH (ref 15–41)
Albumin: 1.3 g/dL — ABNORMAL LOW (ref 3.5–5.0)
Alkaline Phosphatase: 247 U/L — ABNORMAL HIGH (ref 38–126)
Anion gap: 8 (ref 5–15)
BUN: 8 mg/dL (ref 6–20)
CO2: 28 mmol/L (ref 22–32)
Calcium: 8 mg/dL — ABNORMAL LOW (ref 8.9–10.3)
Chloride: 97 mmol/L — ABNORMAL LOW (ref 98–111)
Creatinine, Ser: 0.69 mg/dL (ref 0.61–1.24)
GFR, Estimated: >60 mL/min (ref >60)
Glucose, Bld: 121 mg/dL — ABNORMAL HIGH (ref 70–99)
Potassium: 3.7 mmol/L (ref 3.5–5.1)
Sodium: 133 mmol/L — ABNORMAL LOW (ref 135–145)
Total Bilirubin: 20.6 mg/dL (ref 0.3–1.2)
Total Protein: 7.3 g/dL (ref 6.5–8.1)

## 2019-11-28 LAB — PROTIME-INR
INR: 2 — ABNORMAL HIGH (ref 0.8–1.2)
Prothrombin Time: 22 seconds — ABNORMAL HIGH (ref 11.4–15.2)

## 2019-11-28 LAB — AMMONIA: Ammonia: 82 umol/L — ABNORMAL HIGH (ref 9–35)

## 2019-11-28 LAB — IRON: Iron: 87 ug/dL (ref 45–182)

## 2019-11-28 LAB — BILIRUBIN, DIRECT: Bilirubin, Direct: 13.3 mg/dL — ABNORMAL HIGH (ref 0.0–0.2)

## 2019-11-28 MED ORDER — FUROSEMIDE 40 MG PO TABS
40.0000 mg | ORAL_TABLET | Freq: Every day | ORAL | Status: DC
  Administered 2019-11-28 – 2019-12-04 (×7): 40 mg via ORAL
  Filled 2019-11-28 (×7): qty 1

## 2019-11-28 MED ORDER — SODIUM CHLORIDE 0.9 % IV SOLN
3.0000 g | Freq: Four times a day (QID) | INTRAVENOUS | Status: AC
  Administered 2019-11-28 – 2019-12-03 (×20): 3 g via INTRAVENOUS
  Filled 2019-11-28 (×6): qty 8
  Filled 2019-11-28: qty 3
  Filled 2019-11-28 (×2): qty 8
  Filled 2019-11-28 (×2): qty 3
  Filled 2019-11-28 (×2): qty 8
  Filled 2019-11-28: qty 3
  Filled 2019-11-28: qty 8
  Filled 2019-11-28 (×2): qty 3
  Filled 2019-11-28 (×2): qty 8
  Filled 2019-11-28: qty 3
  Filled 2019-11-28: qty 8

## 2019-11-28 MED ORDER — SODIUM CHLORIDE 0.9 % IV SOLN
INTRAVENOUS | Status: DC | PRN
  Administered 2019-11-28: 1000 mL via INTRAVENOUS

## 2019-11-28 MED ORDER — RIFAXIMIN 550 MG PO TABS
550.0000 mg | ORAL_TABLET | Freq: Two times a day (BID) | ORAL | Status: DC
  Administered 2019-11-28 – 2019-12-04 (×13): 550 mg via ORAL
  Filled 2019-11-28 (×13): qty 1

## 2019-11-28 MED ORDER — LACTULOSE 10 GM/15ML PO SOLN
30.0000 g | Freq: Three times a day (TID) | ORAL | Status: DC
  Administered 2019-11-28 – 2019-11-30 (×6): 30 g via ORAL
  Filled 2019-11-28 (×6): qty 60

## 2019-11-28 MED ORDER — SPIRONOLACTONE 25 MG PO TABS
100.0000 mg | ORAL_TABLET | Freq: Every day | ORAL | Status: DC
  Administered 2019-11-28 – 2019-12-04 (×7): 100 mg via ORAL
  Filled 2019-11-28 (×7): qty 4

## 2019-11-28 NOTE — Plan of Care (Signed)
Patient getting out of bed to go to the bath room without calling. Remains very high risk for fall. Video surveillance commenced.

## 2019-11-28 NOTE — Progress Notes (Signed)
Pharmacy Antibiotic Note  Zachary Mata is a 39 y.o. male admitted on 11/25/2019 with aspiration PNA.  Pharmacy has been consulted for unasyn dosing.  Presented with jaundice, abdominal distention, and back pain - started on empiric ceftriaxone for possible SBP. CXR now concerning for aspiration and/or PNA. WBC 25.6 (on prednisolone), Scr 0.69, afebrile.   Plan: Unasyn 3g IV every 6 hours  Monitor renal fx, cx results, clinical pic, and duration of therapy     Temp (24hrs), Avg:98.2 F (36.8 C), Min:97.4 F (36.3 C), Max:98.6 F (37 C)  Recent Labs  Lab 11/25/19 0050 11/25/19 0843 11/26/19 0212 11/27/19 0226 11/28/19 0257  WBC 20.7* 20.1* 19.6* 23.0* 25.6*  CREATININE 0.72 0.63 0.55* 0.72 0.69    CrCl cannot be calculated (Unknown ideal weight.).    Allergies  Allergen Reactions   Sulfa Antibiotics Swelling    Antimicrobials this admission: Ceftraixone 10/21 >> 10/22 Unasyn 10/24 >>   Dose adjustments this admission: N/A  Microbiology results: 10/21 BCx: ngtd 10/21 Peritoneal fluid Cx: ng    Thank you for allowing pharmacy to be a part of this patients care.  Sherron Monday, PharmD, BCCCP Clinical Pharmacist  Phone: [phone] 11/28/2019 4:25 PM  Please check AMION for all [facility] Pharmacy phone numbers After 10:00 PM, call Main Pharmacy [phone]

## 2019-11-28 NOTE — Progress Notes (Signed)
Patient had an unwitnessed fall. He was found on his kneels in the hallway by a nurse. The nurse tech confirmed turning off the bed alarm for the lab tech to draw blood. They both forgot to turn the bed alarm back on when the lab tech finished drawing the blood. The patient didn't sustain any injury. The vital signs is WNL. The patient's spouse was called and notified about the occurrence. MD was paged as well. The patient is resting comfortably in his room. The nurse will continue to monitor.

## 2019-11-28 NOTE — Progress Notes (Signed)
Patient significant other not happy regarding fall with night shift. She is requesting to talk with Director, card given with contact information. Charge nurse informed and talked with her as well. She is asking for patient to woken up from sleep to assess for need for morphine and/or Ativan. I discussed with her that there was respiratory compromise yesterday per report and I will be assessing accordingly and administer medications per my assessments and will talk with the doctor if I feel uncomfortable giving any medications.

## 2019-11-28 NOTE — Progress Notes (Signed)
PROGRESS NOTE    BROOK MALL  JJO:[id number] DOB: 05-11-1980 DOA: 11/25/2019 PCP: Monica Becton, MD    Chief Complaint  Patient presents with  . Abdominal Pain  . Chest Pain    Brief Narrative:   39 year old gentleman with prior history of hypertension, daily alcohol abuse presents to ED with jaundice, abdominal distention, back pain. He was admitted for acute decompensated liver failure secondary to EtOH abuse associated with hyperbilirubinemia, portal hypertension, splenomegaly, esophageal varices, ascites. GI consulted for further evaluation.  Diagnostic paracentesis with fluid studies have been ordered.  Patient was started empirically on IV Rocephin for possible SBP. Th cultures from the ascitic fluid negative so far.   Patient seen and examined bedside he is little bit more alert than yesterday.  Wife at bedside visibly upset about the fall that happened last night.  Patient's ammonia levels are still elevated will increase the dose of lactulose to 30 g 3 times daily.  Assessment & Plan:   Principal Problem:   Alcoholic hepatitis without ascites Active Problems:   Essential hypertension   Alcohol abuse   Jaundice due to hepatitis   Hypokalemia   Alcohol cessation counseling   Alcohol withdrawal (HCC)   Liver failure, acute   Liver cirrhosis with decompensated liver failure Associated with EtOH hepatitis, CT of the abdomen pelvis shows liver cirrhosis, splenomegaly, esophageal varices, portal hypertension and ascites. GI consulted recommended ultrasound paracentesis and sending the ascitic fluid for analysis and culture. Patient started on empiric Rocephin for possible SBP. So far all cultures have been negative.  Patient was started on lactulose, rifaximin, lasic and spironolactone by gastroenterology. Maddrey discriminant function is 44, steroids started.  Bilirubin of 20 and stable.     Ascites:  S/p diagnostic paracentesis, cultures negative.  He  was started on Lasix 40 mg daily  and spironolactone 25 mg DAILY.   Small bowel and colon wall thickening Incidental finding on CT abdomen.  Pt currently is not nauseated or vomiting. Wife at bedside reported vomiting prior to admission.  No nausea or vomiting pt requesting to advance diet.will change to regular diet.    Essential Hypertension;  Well-controlled blood pressure parameters   Hypokalemia: replaced.    Alcohol abuse:  TOC consulted for resources.  On CIWA protocol for alcohol withdrawal symptoms.     Anemia or thrombocytopenia:  Probably secondary to liver cirrhosis.  Platelets improved to 125 today   Leukocytosis:  Unclear etiology, worsening , but cultures have been negative.  Chest x-ray shows Layering small left pleural effusion with overlying left basilar opacities, concerning for aspiration and/or pneumonia.  We will change IV Rocephin to Unasyn for possible aspiration.   Hypoalbuminemia:  From liver cirrhosis.  Albumin ranging between 1.2-1.4    Esophageal varices:  Will probably need an EGD in the future.    Hyponatremia:  Probably from hypervolemic hyponatremia.  Improved to 133 after Lasix  Acute hepatic encephalopathy:  Elevated ammonia levels, 82. Increase lactulose to 30 g 3 times daily.  Pt was found on the floor earlier this am around 3 30 as per the patient's wife.  The BED alarm was not on.  Pt denies hitting his head.    Anxiety and depression:  Will  Get psych evaluation once pt is alert and oriented and able to maintain a conversation.     DVT prophylaxis:scd's Code Status: (FULL CODE Family Communication: Family at bedside.  Disposition:   Status is: Inpatient  The patient will require care spanning >  2 midnights and should be moved to inpatient because: Hemodynamically unstable, Ongoing diagnostic testing needed not appropriate for outpatient work up, Unsafe d/c plan, IV treatments appropriate due to intensity of  illness or inability to take PO and Inpatient level of care appropriate due to severity of illness  Dispo: The patient is from: Home              Anticipated d/c is to: pending              Anticipated d/c date is: > 3 days              Patient currently is not medically stable to d/c.       Consultants:   Gastroenterology    Procedures; Paracentesis about 820 ml fluid taken off.   Antimicrobials: rocephin sine admission   Subjective: Pt little more alert than yesterday.  No nausea, vomiting or abdominal pain.   Objective: Vitals:   11/27/19 2313 11/28/19 0334 11/28/19 0705 11/28/19 1135  BP: 131/71 (!) 153/94 111/76 111/67  Pulse:  (!) 105 95 92  Resp: 17 15 12 13   Temp: 98.4 F (36.9 C) (!) 97.4 F (36.3 C) 98.4 F (36.9 C) 98.6 F (37 C)  TempSrc: Oral Oral Oral Oral  SpO2: 98% 92% 96% 90%    Intake/Output Summary (Last 24 hours) at 11/28/2019 1538 Last data filed at 11/28/2019 1014 Gross per 24 hour  Intake 339.56 ml  Output 1725 ml  Net -1385.44 ml   There were no vitals filed for this visit.  Examination:  General exam: Ill-appearing gentleman jaundiced, does not appear to be in any kind of distress. Respiratory system: Diminished air entry at bases, no wheezing or rhonchi, no tachypnea Cardiovascular system: S1-S2 heard, regular rate rhythm, no JVD Gastrointestinal system: Min is firm distended, no tenderness bowel sounds normal  Central nervous system: Lethargic, will wake up to verbal cues answer yes or no and go back to sleep Extremities: leg edema present,  Skin: spider naevi present.  Psychiatry: Could not be assessed    Data Reviewed: I have personally reviewed following labs and imaging studies  CBC: Recent Labs  Lab 11/25/19 0050 11/25/19 0843 11/26/19 0212 11/27/19 0226 11/28/19 0257  WBC 20.7* 20.1* 19.6* 23.0* 25.6*  NEUTROABS  --   --  15.8*  --   --   HGB 8.3* 7.9* 7.4* 7.4* 7.5*  HCT 23.9* 22.2* 21.6* 21.5* 21.4*  MCV  113.8* 114.4* 116.8* 116.2* 115.7*  PLT 126* 115* 101* 103* 125*    Basic Metabolic Panel: Recent Labs  Lab 11/25/19 0050 11/25/19 0417 11/25/19 0843 11/26/19 0212 11/27/19 0226 11/28/19 0257  NA 129*  --  130* 131* 131* 133*  K 2.7*  --  4.0 3.4* 3.5 3.7  CL 92*  --  93* 95* 96* 97*  CO2 25  --  25 25 26 28   GLUCOSE 112*  --  97 53* 189* 121*  BUN <5*  --  <5* <5* 6 8  CREATININE 0.72  --  0.63 0.55* 0.72 0.69  CALCIUM 7.6*  --  7.3* 7.5* 7.8* 8.0*  MG  --  1.7  --   --   --   --   PHOS  --   --  4.4  --   --   --     GFR: CrCl cannot be calculated (Unknown ideal weight.).  Liver Function Tests: Recent Labs  Lab 11/25/19 0050 11/25/19 [id number] 11/26/19 [id number] 11/27/19 0226 11/28/19 0257  AST 103* 109* 103* 93* 70*  ALT 28 29 26 26 26   ALKPHOS 301* 267* 261* 227* 247*  BILITOT 16.6* 15.5* 16.8* 20.7* 20.6*  PROT 7.6 7.2 7.1 7.1 7.3  ALBUMIN 1.5* 1.4* 1.4* 1.2* 1.3*    CBG: No results for input(s): GLUCAP in the last 168 hours.   Recent Results (from the past 240 hour(s))  Respiratory Panel by RT PCR (Flu A&B, Covid) - Nasopharyngeal Swab     Status: None   Collection Time: 11/25/19  4:24 AM   Specimen: Nasopharyngeal Swab  Result Value Ref Range Status   SARS Coronavirus 2 by RT PCR NEGATIVE NEGATIVE Final    Comment: (NOTE) SARS-CoV-2 target nucleic acids are NOT DETECTED.  The SARS-CoV-2 RNA is generally detectable in upper respiratoy specimens during the acute phase of infection. The lowest concentration of SARS-CoV-2 viral copies this assay can detect is 131 copies/mL. A negative result does not preclude SARS-Cov-2 infection and should not be used as the sole basis for treatment or other patient management decisions. A negative result may occur with  improper specimen collection/handling, submission of specimen other than nasopharyngeal swab, presence of viral mutation(s) within the areas targeted by this assay, and inadequate number of viral  copies (<131 copies/mL). A negative result must be combined with clinical observations, patient history, and epidemiological information. The expected result is Negative.  Fact Sheet for Patients:  11/27/19  Fact Sheet for Healthcare Providers:  https://www.moore.com/  This test is no t yet approved or cleared by the https://www.young.biz/ FDA and  has been authorized for detection and/or diagnosis of SARS-CoV-2 by FDA under an Emergency Use Authorization (EUA). This EUA will remain  in effect (meaning this test can be used) for the duration of the COVID-19 declaration under Section 564(b)(1) of the Act, 21 U.S.C. section 360bbb-3(b)(1), unless the authorization is terminated or revoked sooner.     Influenza A by PCR NEGATIVE NEGATIVE Final   Influenza B by PCR NEGATIVE NEGATIVE Final    Comment: (NOTE) The Xpert Xpress SARS-CoV-2/FLU/RSV assay is intended as an aid in  the diagnosis of influenza from Nasopharyngeal swab specimens and  should not be used as a sole basis for treatment. Nasal washings and  aspirates are unacceptable for Xpert Xpress SARS-CoV-2/FLU/RSV  testing.  Fact Sheet for Patients: [place]  Fact Sheet for Healthcare Providers: https://www.moore.com/  This test is not yet approved or cleared by the https://www.young.biz/ FDA and  has been authorized for detection and/or diagnosis of SARS-CoV-2 by  FDA under an Emergency Use Authorization (EUA). This EUA will remain  in effect (meaning this test can be used) for the duration of the  Covid-19 declaration under Section 564(b)(1) of the Act, 21  U.S.C. section 360bbb-3(b)(1), unless the authorization is  terminated or revoked. Performed at [facility], [address], [place], [place]   Gram stain     Status: None   Collection Time: 11/25/19  4:17 PM   Specimen: Abdomen; Peritoneal Fluid  Result Value  Ref Range Status   Specimen Description FLUID PERITONEAL ABDOMEN  Final   Special Requests NONE  Final   Gram Stain   Final    WBC PRESENT, PREDOMINANTLY MONONUCLEAR RED BLOOD CELLS PRESENT NO ORGANISMS SEEN CYTOSPIN SMEAR Performed at [facility], [address], [place], [place]    Report Status 11/25/2019 FINAL  Final  Culture, body fluid-bottle     Status: None (Preliminary result)   Collection Time:  11/25/19  4:17 PM   Specimen: Fluid  Result Value Ref Range Status   Specimen Description FLUID PERITONEAL ABDOMEN  Final   Special Requests BOTTLES DRAWN AEROBIC AND ANAEROBIC  Final   Culture   Final    NO GROWTH 3 DAYS Performed at [facility], [address] [place], [place] [address]    Report Status PENDING  Incomplete         Radiology Studies: DG Chest 2 View  Result Date: 11/27/2019 CLINICAL DATA:  Chest pain. EXAM: CHEST - 2 VIEW COMPARISON:  11/25/2019 FINDINGS: Low lung volumes. Layering small left pleural effusion with overlying left basilar opacities. Right upper lobe scarring. No discernible pneumothorax. Cardiomediastinal silhouette is accentuated by low lung volumes. No acute osseous abnormality. IMPRESSION: Layering small left pleural effusion with overlying left basilar opacities, concerning for aspiration and/or pneumonia. Electronically Signed   By: Feliberto Harts MD   On: 11/27/2019 16:44        Scheduled Meds: . furosemide  40 mg Oral Daily  . lactulose  30 g Oral TID  . LORazepam  0-4 mg Intravenous Q8H  . nicotine  21 mg Transdermal Daily  . potassium & sodium phosphates  1 packet Oral TID WC & HS  . prednisoLONE  40 mg Oral Daily  . rifaximin  550 mg Oral BID  . spironolactone  100 mg Oral Daily  . thiamine  100 mg Oral Daily   Or  . thiamine  100 mg Intravenous Daily   Continuous Infusions:    LOS: 2 days     Kathlen Mody, MD [facility]   To contact the attending provider between 7A-7P or the  covering provider during after hours 7P-7A, please log into the web site [email or web address] and access using universal [place] password for that web site. If you do not have the password, please call the hospital operator.  11/28/2019, 3:38 PM

## 2019-11-28 NOTE — Progress Notes (Addendum)
[facility] Progress Note  CC:  EtOH hepatitis, cirrhosis, elevated bilirubin    Subjective: He continues to have intermittent confusion. He fell out of bed around 3 AM and he landed on his knee, he did not hit his head. No N/V or abdominal pain. No BM today. Sometimes feels it is difficult to take in a deep breath. His wife is at the bedside.   He is unable to provide helpful review of systems.   Objective:  Vital signs in last 24 hours: Temp:  [97.4 F (36.3 C)-98.6 F (37 C)] 98.6 F (37 C) (10/24 1135) Pulse Rate:  [92-105] 92 (10/24 1135) Resp:  [12-17] 13 (10/24 1135) BP: (111-153)/(67-94) 111/67 (10/24 1135) SpO2:  [90 %-98 %] 90 % (10/24 1135) Last BM Date: 11/28/19 General:  Awake 39 year old in NAD.  Heart: RRR, soft murmur.  Pulm: Breath sounds clear, few crackles RLL.  Abdomen: Small amount of ascites with gaseous distension. Nontender. + BS x 4 quads.  Extremities:  LEs with trace edema.  Neurologic:  Alert and  oriented x 1. He knows he is in [place]. Moves all extremities. No asterixis.  Psych:  Alert and cooperative without agitation at this time.  Skin: Moderate jaundice.   Intake/Output from previous day: 10/23 0701 - 10/24 0700 In: 939.5 [I.V.:939.5] Out: 2275 [Urine:2275] Intake/Output this shift: Total I/O In: -  Out: 550 [Urine:550]  Lab Results: Recent Labs    11/26/19 0212 11/27/19 0226 11/28/19 0257  WBC 19.6* 23.0* 25.6*  HGB 7.4* 7.4* 7.5*  HCT 21.6* 21.5* 21.4*  PLT 101* 103* 125*   BMET Recent Labs    11/26/19 0212 11/27/19 0226 11/28/19 0257  NA 131* 131* 133*  K 3.4* 3.5 3.7  CL 95* 96* 97*  CO2 _0 GLUCOSE 53* 189* 121*  BUN <5* 6 8  CREATININE 0.55* 0.72 0.69  CALCIUM 7.5* 7.8* 8.0*   LFT Recent Labs    11/26/19 0212 11/27/19 0226 11/28/19 0257  PROT 7.1   < > 7.3  ALBUMIN 1.4*   < > 1.3*  AST 103*   < > 70*  ALT 26   < > 26  ALKPHOS 261*   < > 247*  BILITOT 16.8*   < > 20.6*    BILIDIR 8.7*  --  13.3*  IBILI 8.1*  --   --    < > = values in this interval not displayed.   PT/INR Recent Labs    11/27/19 0226 11/28/19 0257  LABPROT 25.0* 22.0*  INR 2.4* 2.0*   Hepatitis Panel No results for input(s): HEPBSAG, HCVAB, HEPAIGM, HEPBIGM in the last 72 hours.  DG Chest 2 View  Result Date: 11/27/2019 CLINICAL DATA:  Chest pain. EXAM: CHEST - 2 VIEW COMPARISON:  11/25/2019 FINDINGS: Low lung volumes. Layering small left pleural effusion with overlying left basilar opacities. Right upper lobe scarring. No discernible pneumothorax. Cardiomediastinal silhouette is accentuated by low lung volumes. No acute osseous abnormality. IMPRESSION: Layering small left pleural effusion with overlying left basilar opacities, concerning for aspiration and/or pneumonia. Electronically Signed   By: Margaretha Sheffield MD   On: 11/27/2019 16:44    Assessment / Plan:  4. 39 year old male with EtOH hepatitis and newly diagnosed decompensated cirrhosis. CTAP 10/21 showed diffuse fatty liver, probable cirrhosis, portal HTN, splenomegaly, esophageal varices and ascites. S/P 10/21,  820 cc of peritoneal fluid was removed without evidence of SBP. Cytology pending. Cx no growth 2 days. MDF >  32. Prednisolone 77m QD was started on 10/22.  Negative acute hepatitis panel. He has hyponatremia with normal renal function. Alk phos 247.  AST 70.  ALT 26.  Total bili 20.6. -Continue Prednisolone, today is day # 3. Check Lille score on day # 7 -Furosemide was increased to 452mpo daily and Spironolactone increased to 100 mg po daily -Monitor renal status closely -Consider repeat abdominal sonogram next 1 or 2 days to verify if enough fluid for paracentesis -CMP in am  2. Hepatic encephalopathy in setting of alcohol withdrawal. Ammonia 82. -CIWA protocol maintain -Continue Lactulose -Monitor neuro status closely -Keep bed alarm on at all times  3.  Macrocytic anemia secondary to #1.  Hemoglobin  stable at 7.4.  Hematocrit 21.5.  MCV 116.2. No signs of active GI bleeding. -CBC in am  4. Thrombocytopenia secondary to #1.  Platelet 125.  5.  Coagulopathy secondary to #1. INR 2.4 ->2.0 -Repeat INR in am  6. Leukocytosis, most likely secondary to Prednisolone and # 1. WBC 25.6.      Principal Problem:   Alcoholic hepatitis without ascites Active Problems:   Essential hypertension   Alcohol abuse   Jaundice due to hepatitis   Hypokalemia   Alcohol cessation counseling   Alcohol withdrawal ([place]  Liver failure, acute     LOS: 2 days   CoNoralyn Pick10/24/2021, 4:38 PM  I have discussed the case with the PA, and that is the plan I formulated. I personally interviewed and examined the patient.  I had another long talk with his wife about everything.  He remains a very sick man his prognosis is uncertain.  She is understandably upset that he had a fall try to get out of bed while unattended last night.  Fortunately, he does not appear to have suffered serious injury from that.  He remains encephalopathic, so rifaximin is added and lactulose increased. He is still distended, I think it is a combination of ascites and gaseous distention from lactulose. Fortunately, renal function remained stable.  Therefore I made a modest increase in diuretic doses this morning, but it is certainly too soon to yet see that effect.  We have to be cautious given the severity of his liver disease and lack of peripheral edema.  I had originally suggested the possibility of repeat ultrasound-guided paracentesis for no more than 2 or 3 L early in the week, and the hospitalist order that.  However, we canceled it because I would prefer repeat exam by our service and check on renal function prior to doing that.  He is not presently in distress from the distention.  Continuing prednisolone, prognosis guarded.   35 minutes were spent on this encounter (including chart review, history/exam,  counseling/coordination of care, and documentation)  HeNelida MeuseII Office: [phone]

## 2019-11-29 DIAGNOSIS — K7201 Acute and subacute hepatic failure with coma: Secondary | ICD-10-CM | POA: Diagnosis not present

## 2019-11-29 DIAGNOSIS — K7010 Alcoholic hepatitis without ascites: Secondary | ICD-10-CM | POA: Diagnosis not present

## 2019-11-29 DIAGNOSIS — F1010 Alcohol abuse, uncomplicated: Secondary | ICD-10-CM | POA: Diagnosis not present

## 2019-11-29 DIAGNOSIS — K7011 Alcoholic hepatitis with ascites: Secondary | ICD-10-CM | POA: Diagnosis not present

## 2019-11-29 DIAGNOSIS — K7031 Alcoholic cirrhosis of liver with ascites: Secondary | ICD-10-CM | POA: Diagnosis not present

## 2019-11-29 LAB — COMPREHENSIVE METABOLIC PANEL
ALT: 32 U/L (ref 0–44)
AST: 71 U/L — ABNORMAL HIGH (ref 15–41)
Albumin: 1.4 g/dL — ABNORMAL LOW (ref 3.5–5.0)
Alkaline Phosphatase: 235 U/L — ABNORMAL HIGH (ref 38–126)
Anion gap: 10 (ref 5–15)
BUN: 8 mg/dL (ref 6–20)
CO2: 28 mmol/L (ref 22–32)
Calcium: 7.8 mg/dL — ABNORMAL LOW (ref 8.9–10.3)
Chloride: 98 mmol/L (ref 98–111)
Creatinine, Ser: 0.71 mg/dL (ref 0.61–1.24)
GFR, Estimated: >60 mL/min (ref >60)
Glucose, Bld: 117 mg/dL — ABNORMAL HIGH (ref 70–99)
Potassium: 3.5 mmol/L (ref 3.5–5.1)
Sodium: 136 mmol/L (ref 135–145)
Total Bilirubin: 19.2 mg/dL (ref 0.3–1.2)
Total Protein: 7.5 g/dL (ref 6.5–8.1)

## 2019-11-29 LAB — CBC
HCT: 22.4 % — ABNORMAL LOW (ref 39.0–52.0)
Hemoglobin: 7.6 g/dL — ABNORMAL LOW (ref 13.0–17.0)
MCH: 39.8 pg — ABNORMAL HIGH (ref 26.0–34.0)
MCHC: 33.9 g/dL (ref 30.0–36.0)
MCV: 117.3 fL — ABNORMAL HIGH (ref 80.0–100.0)
Platelets: 124 10*3/uL — ABNORMAL LOW (ref 150–400)
RBC: 1.91 MIL/uL — ABNORMAL LOW (ref 4.22–5.81)
RDW: 13.7 % (ref 11.5–15.5)
WBC: 19 10*3/uL — ABNORMAL HIGH (ref 4.0–10.5)
nRBC: 0 % (ref 0.0–0.2)

## 2019-11-29 LAB — CERULOPLASMIN: Ceruloplasmin: 18.2 mg/dL (ref 16.0–31.0)

## 2019-11-29 LAB — PROTIME-INR
INR: 1.8 — ABNORMAL HIGH (ref 0.8–1.2)
Prothrombin Time: 19.9 seconds — ABNORMAL HIGH (ref 11.4–15.2)

## 2019-11-29 LAB — ANTI-SMOOTH MUSCLE ANTIBODY, IGG: F-Actin IgG: 22 Units — ABNORMAL HIGH (ref 0–19)

## 2019-11-29 LAB — ANA: Anti Nuclear Antibody (ANA): NEGATIVE

## 2019-11-29 LAB — IGG: IgG (Immunoglobin G), Serum: 2221 mg/dL — ABNORMAL HIGH (ref 603–1613)

## 2019-11-29 LAB — CYTOLOGY - NON PAP

## 2019-11-29 LAB — MITOCHONDRIAL ANTIBODIES: Mitochondrial M2 Ab, IgG: <20 Units (ref 0.0–20.0)

## 2019-11-29 MED ORDER — LORAZEPAM 1 MG PO TABS
1.0000 mg | ORAL_TABLET | ORAL | Status: AC | PRN
  Administered 2019-11-29: 1 mg via ORAL
  Administered 2019-11-30 – 2019-12-01 (×6): 2 mg via ORAL
  Filled 2019-11-29 (×5): qty 2
  Filled 2019-11-29: qty 1
  Filled 2019-11-29: qty 2

## 2019-11-29 MED ORDER — RESOURCE THICKENUP CLEAR PO POWD
ORAL | Status: DC | PRN
  Filled 2019-11-29: qty 125

## 2019-11-29 MED ORDER — LORAZEPAM 2 MG/ML IJ SOLN
1.0000 mg | INTRAMUSCULAR | Status: AC | PRN
  Administered 2019-11-29: 1 mg via INTRAVENOUS
  Administered 2019-11-29: 4 mg via INTRAVENOUS
  Administered 2019-11-30 (×2): 2 mg via INTRAVENOUS
  Administered 2019-11-30: 4 mg via INTRAVENOUS
  Administered 2019-12-01: 2 mg via INTRAVENOUS
  Filled 2019-11-29: qty 2
  Filled 2019-11-29 (×4): qty 1
  Filled 2019-11-29: qty 2

## 2019-11-29 NOTE — Progress Notes (Addendum)
Daily Rounding Note  11/29/2019, 11:47 AM  LOS: 3 days   SUBJECTIVE:   Chief complaint:  Acute ETOH hepatitis and cirrhosis.       C/o neck, head, abdominal pain.      OBJECTIVE:         Vital signs in last 24 hours:    Temp:  [97.7 F (36.5 C)-98.8 F (37.1 C)] 97.7 F (36.5 C) (10/25 0751) Pulse Rate:  [74-94] 93 (10/25 0751) Resp:  [10-18] 18 (10/25 0751) BP: (106-113)/(64-82) 111/77 (10/25 0751) SpO2:  [91 %-99 %] 94 % (10/25 0751) Weight:  [62.2 kg] 62.2 kg (10/24 1643) Last BM Date: 11/28/19 Filed Weights   11/28/19 1643  Weight: 62.2 kg   General: looks acutely and chronically ill.  Jaundiced, malnourished, old for age   Wife at bedside, solicitous towards pt.   Heart: RRR,  Chest: diminished BS globally.  Wet vocal quality and cough.   Abdomen: soft, slightly distended.  Tender without guard or rebound in LUQ/epigastrium.  BS present  Extremities: no CCE Neuro/Psych:  + asterixis and UE tremors.  Slow mentation but not acutely confused.    Intake/Output from previous day: 10/24 0701 - 10/25 0700 In: -  Out: 3350 [Urine:3350]   Lab Results: Recent Labs    11/27/19 0226 11/28/19 0257 11/29/19 0404  WBC 23.0* 25.6* 19.0*  HGB 7.4* 7.5* 7.6*  HCT 21.5* 21.4* 22.4*  PLT 103* 125* 124*   BMET Recent Labs    11/27/19 0226 11/28/19 0257 11/29/19 0404  NA 131* 133* 136  K 3.5 3.7 3.5  CL 96* 97* 98  CO2 26 28 28   GLUCOSE 189* 121* 117*  BUN 6 8 8   CREATININE 0.72 0.69 0.71  CALCIUM 7.8* 8.0* 7.8*   LFT Recent Labs    11/27/19 0226 11/28/19 0257 11/29/19 0404  PROT 7.1 7.3 7.5  ALBUMIN 1.2* 1.3* 1.4*  AST 93* 70* 71*  ALT 26 26 32  ALKPHOS 227* 247* 235*  BILITOT 20.7* 20.6* 19.2*  BILIDIR  --  13.3*  --    PT/INR Recent Labs    11/28/19 0257 11/29/19 0404  LABPROT 22.0* 19.9*  INR 2.0* 1.8*   Hepatitis Panel No results for input(s): HEPBSAG, HCVAB, HEPAIGM, HEPBIGM  in the last 72 hours.  Studies/Results: DG Chest 2 View  Result Date: 11/27/2019 CLINICAL DATA:  Chest pain. EXAM: CHEST - 2 VIEW COMPARISON:  11/25/2019 FINDINGS: Low lung volumes. Layering small left pleural effusion with overlying left basilar opacities. Right upper lobe scarring. No discernible pneumothorax. Cardiomediastinal silhouette is accentuated by low lung volumes. No acute osseous abnormality. IMPRESSION: Layering small left pleural effusion with overlying left basilar opacities, concerning for aspiration and/or pneumonia. Electronically Signed   By: 11/29/2019 MD   On: 11/27/2019 16:44    Scheduled Meds: . furosemide  40 mg Oral Daily  . lactulose  30 g Oral TID  . LORazepam  0-4 mg Intravenous Q8H  . nicotine  21 mg Transdermal Daily  . potassium & sodium phosphates  1 packet Oral TID WC & HS  . prednisoLONE  40 mg Oral Daily  . rifaximin  550 mg Oral BID  . spironolactone  100 mg Oral Daily  . thiamine  100 mg Oral Daily   Or  . thiamine  100 mg Intravenous Daily   Continuous Infusions: . sodium chloride 1,000 mL (11/28/19 1826)  . ampicillin-sulbactam (UNASYN) IV 3 g (11/29/19 0546)  PRN Meds:.sodium chloride, LORazepam **OR** LORazepam, morphine injection, Resource ThickenUp Clear, traMADol   ASSESMENT:   *    Alcoholic hepatitis and cirrhosis.   Hep B, C serologies negative.   Pending: IgG, ANA, smooth muscle Ab, mitochondrial Ab, Ceruloplasmin Decompensated w portal hth, splenomegaly, esoph varices, ascites on CTAP.  Disc fx score >32, Prednisolone day 4.   *   Coagulopathy (INR 2.4 >> 1.8, no Vit K thus far),  Thrombocytopenia (101 >> 124,  48 in 2019)  *   Ascites.  820 cc paracentesis on 10/21: no SBP Renal function ok.   Diuretics updosed yesterday and currently are: Aldactone 100 mg/day.  Furosemide 40 mg/day.       *   Chronic alcoholism and dependence.  Dates back many years.  Last ETOH was late PM of 10/20.    *   Dysphagia.  Choking w  thins per staff observation and on today's BS swallow eval.   Moderate aspiration risk.     SLP BS swallow eval 10/25: ordered regular, thin liquids.  Nectar liquids recommended but pt's wife wants thin liquids, understanding aspiration risk.    *   WBCs 25.6 >> 19 K.    *   Macrocytic anemia.  Hgb 12.9 >> 7.6.  Was 15.7 in late August 2021.    *   AMS.  Suspect ETOH withdrawal and HE.  Ammonia 82.  High dose lactulose, rifaximin in place.    *   Aspiration PNA, Unasyn day 2.  Previous Rocephin.     PLAN   *   Lille score due 10/28.   Continue supportive care.   ? EGD before discharge to survey the varices?   *   Continue diuretics.      Jennye Moccasin  11/29/2019, 11:47 AM Phone [phone]    [facility] Attending   I have taken an interval history, reviewed the chart and examined the patient. I agree with the Advanced Practitioner's note, impression and recommendations.   He remains seriously ill with alcoholic hepatitis and complications.  Continue current care.  Biochemical parameters are slightly improved.  No decision about EGD at this time.  Await clinical course. Prognosis is guarded at best.  Iva Boop, MD, [facility] 11/29/2019 1:41 PM

## 2019-11-29 NOTE — Evaluation (Signed)
Clinical/Bedside Swallow Evaluation Patient Details  Name: Zachary Mata MRN: [id number] Date of Birth: 25-Jul-1980  Today's Date: 11/29/2019 Time: SLP Start Time (ACUTE ONLY): 0935 SLP Stop Time (ACUTE ONLY): 1000 SLP Time Calculation (min) (ACUTE ONLY): 25 min  Past Medical History:  Past Medical History:  Diagnosis Date  . Chronic back pain 03/02/2012  . Essential hypertension 03/02/2012  . Hypertension    Past Surgical History:  Past Surgical History:  Procedure Laterality Date  . HAND SURGERY    . IR PARACENTESIS  11/25/2019  . WRIST SURGERY     HPI:  39 year old gentleman with prior history of hypertension, daily alcohol abuse presents to ED with jaundice, abdominal distention, back pain. He was admitted for acute decompensated liver failure secondary to EtOH abuseassociated with hyperbilirubinemia, portal hypertension, splenomegaly, esophageal varices, ascites.  CXR on 10/23 "concerning for aspiration and/or pneumonia".      Assessment / Plan / Recommendation Clinical Impression  Pt was seen for a bedside swallow evaluation in the setting of a CXR concerning for aspiration.  Pt was encountered lethargic in bed with wife present at bedside.  Oral mech examination was remarkable for generalized weakness.  Patient reported that he will intermittently get "choked" when drinking thin liquids.  Pt was seen with thin liquid, nectar-thick liquid, puree, and regular solids.  He exhibited an immediate cough following 3/3 trials of thin liquid via cup and straw sip and he exhibited a delayed cough following 1/5 trials of nectar-thick liquid.  No overt s/sx of aspiration were observed with trials of honey-thick liquid, puree, or regular solids.  Mastication of regular solids was mildly prolonged, but effective with no oral residue observed.  Recommend diet change to nectar-thick liquids given consistent clinical s/sx of aspiration with thin liquids.  Had a long discussion with the pt's wife  regarding recommendations/risk of aspiration with thin liquids, and she was agreeable to trying nectar-thick liquids, but stated that she was concerned with the pt's quality of life and that she did not want him to decline in other areas secondary to being limited to nectar-thick liquids.  Therefore, the pt's diet order was changed to regular solids and nectar-thick liquids, but the pt may have thin liquids per wife discretion with known risk of aspiration.  Discussed with MD and RN and hung signs with recommendations in the pt's room.  Pt may benefit from an instrumental swallow study if clinical s/sx of aspiration persist with thin liquid.  Wife was agreeable to study if warranted.  SLP will f/u per POC.    SLP Visit Diagnosis: Dysphagia, unspecified (R13.10)    Aspiration Risk  Moderate aspiration risk    Diet Recommendation Regular;Nectar-thick liquid (Pt may have thin liquid per wife discretion )   Liquid Administration via: Cup Medication Administration: Whole meds with puree Supervision: Staff to assist with self feeding;Full supervision/cueing for compensatory strategies Compensations: Minimize environmental distractions;Slow rate;Small sips/bites Postural Changes: Seated upright at 90 degrees    Other  Recommendations Oral Care Recommendations: Oral care BID;Staff/trained caregiver to provide oral care Other Recommendations: Order thickener from pharmacy   Follow up Recommendations 24 hour supervision/assistance;Skilled Nursing facility      Frequency and Duration min 2x/week  2 weeks       Prognosis Prognosis for Safe Diet Advancement: Fair Barriers to Reach Goals: Cognitive deficits      Swallow Study   General HPI: 39 year old gentleman with prior history of hypertension, daily alcohol abuse presents to ED with jaundice, abdominal distention, back  pain. He was admitted for acute decompensated liver failure secondary to EtOH abuseassociated with hyperbilirubinemia, portal  hypertension, splenomegaly, esophageal varices, ascites.  CXR on 10/23 "concerning for aspriation and/or pneumonia".    Type of Study: Bedside Swallow Evaluation Previous Swallow Assessment: None  Diet Prior to this Study: Regular;Thin liquids Temperature Spikes Noted: Yes Respiratory Status: Nasal cannula History of Recent Intubation: No Behavior/Cognition: Lethargic/Drowsy;Distractible;Requires cueing;Pleasant mood Oral Cavity Assessment: Within Functional Limits Oral Care Completed by SLP: No Oral Cavity - Dentition: Adequate natural dentition Vision: Functional for self-feeding Self-Feeding Abilities: Needs assist;Needs set up Patient Positioning: Upright in bed Baseline Vocal Quality: Low vocal intensity Volitional Cough: Weak;Congested Volitional Swallow: Unable to elicit    Oral/Motor/Sensory Function Overall Oral Motor/Sensory Function: Generalized oral weakness   Ice Chips Ice chips: Not tested   Thin Liquid Thin Liquid: Impaired Presentation: Cup;Self Fed;Straw Pharyngeal  Phase Impairments: Cough - Immediate    Nectar Thick Nectar Thick Liquid: Impaired Presentation: Cup;Straw Pharyngeal Phase Impairments: Cough - Delayed   Honey Thick Honey Thick Liquid: Within functional limits   Puree Puree: Within functional limits Presentation: Spoon   Solid     Solid: Within functional limits Presentation: Self Fed Oral Phase Functional Implications: Prolonged oral transit     Villa Herb M.S., CCC-SLP Acute Rehabilitation Services Office: [phone]  Shanon Rosser Kima Malenfant 11/29/2019,10:40 AM

## 2019-11-29 NOTE — Plan of Care (Signed)

## 2019-11-29 NOTE — Progress Notes (Signed)
PROGRESS NOTE    Zachary Mata  OZY:[id number] DOB: [birth year] DOA: 11/25/2019 PCP: Monica Becton, MD    Chief Complaint  Patient presents with  . Abdominal Pain  . Chest Pain    Brief Narrative:   39 year old gentleman with prior history of hypertension, daily alcohol abuse presents to ED with jaundice, abdominal distention, back pain. He was admitted for acute decompensated liver failure secondary to EtOH abuse associated with hyperbilirubinemia, portal hypertension, splenomegaly, esophageal varices, ascites. GI consulted for further evaluation.  Diagnostic paracentesis with fluid studies have been ordered.  Patient was started empirically on IV Rocephin for possible SBP. Th cultures from the ascitic fluid negative so far.   Pt seen and examined at bedside, 2 BM in the last 24 hours.  Bili is slowly improving. Pt is more alert today.   Assessment & Plan:   Principal Problem:   Alcoholic hepatitis without ascites Active Problems:   Essential hypertension   Alcohol abuse   Jaundice due to hepatitis   Hypokalemia   Alcohol cessation counseling   Alcohol withdrawal (HCC)   Liver failure, acute   Liver cirrhosis with decompensated liver failure Associated with EtOH hepatitis, CT of the abdomen pelvis shows liver cirrhosis, splenomegaly, esophageal varices, portal hypertension and ascites. GI consulted recommended ultrasound paracentesis and sending the ascitic fluid for analysis and culture. Patient started on empiric Rocephin for possible SBP. So far all cultures have been negative.  Patient was started on lactulose, rifaximin, lasic and spironolactone by gastroenterology. Maddrey discriminant function is 44, steroids started.  Bilirubin improved to 19 . No changes in meds.     Ascites:  S/p diagnostic paracentesis, cultures negative.  He was started on Lasix 40 mg daily  and spironolactone 100 mg DAILY.   Small bowel and colon wall thickening Incidental  finding on CT abdomen.  Pt currently is not nauseated or vomiting. Wife at bedside reported vomiting prior to admission.  Pt nauseated. No vomiting.    Essential Hypertension;  Well-controlled blood pressure parameters   Hypokalemia: replaced.    Alcohol abuse:  TOC consulted for resources.  On CIWA protocol for alcohol withdrawal symptoms.     Anemia or thrombocytopenia:  Probably secondary to liver cirrhosis.  Platelets improved to 124 today   Leukocytosis:  Unclear etiology, worsening , but cultures have been negative.  Chest x-ray shows Layering small left pleural effusion with overlying left basilar opacities, concerning for aspiration and/or pneumonia.  We will change IV Rocephin to Unasyn for possible aspiration. Continue with IV UNASYN.  Leukocytosis improving.    Hypoalbuminemia:  From liver cirrhosis.  Albumin ranging between 1.2-1.4    Esophageal varices:  Will probably need an EGD in the future.    Hyponatremia:  Probably from hypervolemic hyponatremia.  Improved to 133 after Lasix  Acute hepatic encephalopathy:  Elevated ammonia levels, 82. Increase lactulose to 30 g 3 times daily.  Pt was found on the floor earlier this am around 3 30 as per the patient's wife.  The BED alarm was not on.  Pt denies hitting his head.    Anxiety and depression:  Will  Get psych evaluation once pt is alert and oriented and able to maintain a conversation.      DVT prophylaxis:scd's Code Status: (FULL CODE Family Communication: Family at bedside.  Disposition:   Status is: Inpatient  The patient will require care spanning > 2 midnights and should be moved to inpatient because: Hemodynamically unstable, Ongoing diagnostic testing needed not appropriate  for outpatient work up, Unsafe d/c plan, IV treatments appropriate due to intensity of illness or inability to take PO and Inpatient level of care appropriate due to severity of illness  Dispo: The patient is  from: Home              Anticipated d/c is to: pending              Anticipated d/c date is: > 3 days              Patient currently is not medically stable to d/c.       Consultants:   Gastroenterology    Procedures; Paracentesis about 820 ml fluid taken off.   Antimicrobials: rocephin sine admission   Subjective: Pt slightl nauseated, no vomiting.    Objective: Vitals:   11/29/19 0418 11/29/19 0751 11/29/19 1229 11/29/19 1600  BP: 113/82 111/77 108/74 104/67  Pulse: 74 93 77 73  Resp: 11 18 10 10   Temp: 97.8 F (36.6 C) 97.7 F (36.5 C) 98.1 F (36.7 C) 97.8 F (36.6 C)  TempSrc: Oral Oral Oral Oral  SpO2: 99% 94%  96%  Weight:      Height:        Intake/Output Summary (Last 24 hours) at 11/29/2019 1654 Last data filed at 11/29/2019 1226 Gross per 24 hour  Intake 120 ml  Output 3400 ml  Net -3280 ml   Filed Weights   11/28/19 1643  Weight: 62.2 kg    Examination:  General exam: ill appearing gentleman, not in distress, jaundiced.  Respiratory system: air entry fair, no tachypnea, no wheezing heard.  Cardiovascular system: S1S2, RRR , no JVD.  Gastrointestinal system: abd is firm, distended, bowel sounds wnl.   Central nervous system: more awake today , answering simple questions.  Extremities: leg edema present,  Skin: spider naevi present.  Psychiatry: Could not be assessed    Data Reviewed: I have personally reviewed following labs and imaging studies  CBC: Recent Labs  Lab 11/25/19 0843 11/26/19 0212 11/27/19 0226 11/28/19 0257 11/29/19 0404  WBC 20.1* 19.6* 23.0* 25.6* 19.0*  NEUTROABS  --  15.8*  --   --   --   HGB 7.9* 7.4* 7.4* 7.5* 7.6*  HCT 22.2* 21.6* 21.5* 21.4* 22.4*  MCV 114.4* 116.8* 116.2* 115.7* 117.3*  PLT 115* 101* 103* 125* 124*    Basic Metabolic Panel: Recent Labs  Lab 11/25/19 0050 11/25/19 0417 11/25/19 0843 11/26/19 0212 11/27/19 0226 11/28/19 0257 11/29/19 0404  NA   < >  --  130* 131* 131* 133*  136  K   < >  --  4.0 3.4* 3.5 3.7 3.5  CL   < >  --  93* 95* 96* 97* 98  CO2   < >  --  25 25 26 28 28   GLUCOSE   < >  --  97 53* 189* 121* 117*  BUN   < >  --  <5* <5* 6 8 8   CREATININE   < >  --  0.63 0.55* 0.72 0.69 0.71  CALCIUM   < >  --  7.3* 7.5* 7.8* 8.0* 7.8*  MG  --  1.7  --   --   --   --   --   PHOS  --   --  4.4  --   --   --   --    < > = values in this interval not displayed.    GFR: Estimated Creatinine  Clearance: 110.1 mL/min (by C-G formula based on SCr of 0.71 mg/dL).  Liver Function Tests: Recent Labs  Lab 11/25/19 0843 11/26/19 0212 11/27/19 0226 11/28/19 0257 11/29/19 0404  AST 109* 103* 93* 70* 71*  ALT 29 26 26 26  32  ALKPHOS 267* 261* 227* 247* 235*  BILITOT 15.5* 16.8* 20.7* 20.6* 19.2*  PROT 7.2 7.1 7.1 7.3 7.5  ALBUMIN 1.4* 1.4* 1.2* 1.3* 1.4*    CBG: No results for input(s): GLUCAP in the last 168 hours.   Recent Results (from the past 240 hour(s))  Respiratory Panel by RT PCR (Flu A&B, Covid) - Nasopharyngeal Swab     Status: None   Collection Time: 11/25/19  4:24 AM   Specimen: Nasopharyngeal Swab  Result Value Ref Range Status   SARS Coronavirus 2 by RT PCR NEGATIVE NEGATIVE Final    Comment: (NOTE) SARS-CoV-2 target nucleic acids are NOT DETECTED.  The SARS-CoV-2 RNA is generally detectable in upper respiratoy specimens during the acute phase of infection. The lowest concentration of SARS-CoV-2 viral copies this assay can detect is 131 copies/mL. A negative result does not preclude SARS-Cov-2 infection and should not be used as the sole basis for treatment or other patient management decisions. A negative result may occur with  improper specimen collection/handling, submission of specimen other than nasopharyngeal swab, presence of viral mutation(s) within the areas targeted by this assay, and inadequate number of viral copies (<131 copies/mL). A negative result must be combined with clinical observations, patient history, and  epidemiological information. The expected result is Negative.  Fact Sheet for Patients:  https://www.moore.com/https://www.fda.gov/media/142436/download  Fact Sheet for Healthcare Providers:  https://www.young.biz/https://www.fda.gov/media/142435/download  This test is no t yet approved or cleared by the [place] FDA and  has been authorized for detection and/or diagnosis of SARS-CoV-2 by FDA under an Emergency Use Authorization (EUA). This EUA will remain  in effect (meaning this test can be used) for the duration of the COVID-19 declaration under Section 564(b)(1) of the Act, 21 U.S.C. section 360bbb-3(b)(1), unless the authorization is terminated or revoked sooner.     Influenza A by PCR NEGATIVE NEGATIVE Final   Influenza B by PCR NEGATIVE NEGATIVE Final    Comment: (NOTE) The Xpert Xpress SARS-CoV-2/FLU/RSV assay is intended as an aid in  the diagnosis of influenza from Nasopharyngeal swab specimens and  should not be used as a sole basis for treatment. Nasal washings and  aspirates are unacceptable for Xpert Xpress SARS-CoV-2/FLU/RSV  testing.  Fact Sheet for Patients: https://www.moore.com/https://www.fda.gov/media/142436/download  Fact Sheet for Healthcare Providers: https://www.young.biz/https://www.fda.gov/media/142435/download  This test is not yet approved or cleared by the [place] FDA and  has been authorized for detection and/or diagnosis of SARS-CoV-2 by  FDA under an Emergency Use Authorization (EUA). This EUA will remain  in effect (meaning this test can be used) for the duration of the  Covid-19 declaration under Section 564(b)(1) of the Act, 21  U.S.C. section 360bbb-3(b)(1), unless the authorization is  terminated or revoked. Performed at [facility], [address] [place], [place] [address]   Gram stain     Status: None   Collection Time: 11/25/19  4:17 PM   Specimen: Abdomen; Peritoneal Fluid  Result Value Ref Range Status   Specimen Description FLUID PERITONEAL ABDOMEN  Final   Special Requests NONE  Final    Gram Stain   Final    WBC PRESENT, PREDOMINANTLY MONONUCLEAR RED BLOOD CELLS PRESENT NO ORGANISMS SEEN CYTOSPIN SMEAR Performed at [facility], [address] [place], [place] [address]    Report Status 11/25/2019 FINAL  Final  Culture, body fluid-bottle     Status: None (Preliminary result)   Collection Time: 11/25/19  4:17 PM   Specimen: Fluid  Result Value Ref Range Status   Specimen Description FLUID PERITONEAL ABDOMEN  Final   Special Requests BOTTLES DRAWN AEROBIC AND ANAEROBIC  Final   Culture   Final    NO GROWTH 4 DAYS Performed at [facility], [address], [place], [place] [address]    Report Status PENDING  Incomplete         Radiology Studies: No results found.      Scheduled Meds: . furosemide  40 mg Oral Daily  . lactulose  30 g Oral TID  . LORazepam  0-4 mg Intravenous Q8H  . nicotine  21 mg Transdermal Daily  . potassium & sodium phosphates  1 packet Oral TID WC & HS  . prednisoLONE  40 mg Oral Daily  . rifaximin  550 mg Oral BID  . spironolactone  100 mg Oral Daily  . thiamine  100 mg Oral Daily   Or  . thiamine  100 mg Intravenous Daily   Continuous Infusions: . sodium chloride 1,000 mL (11/28/19 1826)  . ampicillin-sulbactam (UNASYN) IV 3 g (11/29/19 1322)     LOS: 3 days     Kathlen Mody, MD [facility]   To contact the attending provider between 7A-7P or the covering provider during after hours 7P-7A, please log into the web site [email or web address] and access using universal [place] password for that web site. If you do not have the password, please call the hospital operator.  11/29/2019, 4:54 PM

## 2019-11-30 ENCOUNTER — Inpatient Hospital Stay (HOSPITAL_COMMUNITY): Payer: Commercial Managed Care - PPO

## 2019-11-30 ENCOUNTER — Encounter (INDEPENDENT_AMBULATORY_CARE_PROVIDER_SITE_OTHER): Payer: No Typology Code available for payment source | Admitting: Ophthalmology

## 2019-11-30 DIAGNOSIS — I10 Essential (primary) hypertension: Secondary | ICD-10-CM

## 2019-11-30 DIAGNOSIS — H5360 Unspecified night blindness: Secondary | ICD-10-CM

## 2019-11-30 DIAGNOSIS — H3589 Other specified retinal disorders: Secondary | ICD-10-CM

## 2019-11-30 DIAGNOSIS — K7010 Alcoholic hepatitis without ascites: Secondary | ICD-10-CM | POA: Diagnosis not present

## 2019-11-30 DIAGNOSIS — H469 Unspecified optic neuritis: Secondary | ICD-10-CM

## 2019-11-30 DIAGNOSIS — K7031 Alcoholic cirrhosis of liver with ascites: Secondary | ICD-10-CM | POA: Diagnosis not present

## 2019-11-30 DIAGNOSIS — S0285XS Fracture of orbit, unspecified, sequela: Secondary | ICD-10-CM

## 2019-11-30 DIAGNOSIS — H25813 Combined forms of age-related cataract, bilateral: Secondary | ICD-10-CM

## 2019-11-30 DIAGNOSIS — H35033 Hypertensive retinopathy, bilateral: Secondary | ICD-10-CM

## 2019-11-30 DIAGNOSIS — K7011 Alcoholic hepatitis with ascites: Secondary | ICD-10-CM | POA: Diagnosis not present

## 2019-11-30 DIAGNOSIS — F1010 Alcohol abuse, uncomplicated: Secondary | ICD-10-CM | POA: Diagnosis not present

## 2019-11-30 DIAGNOSIS — H3581 Retinal edema: Secondary | ICD-10-CM

## 2019-11-30 DIAGNOSIS — K7201 Acute and subacute hepatic failure with coma: Secondary | ICD-10-CM | POA: Diagnosis not present

## 2019-11-30 LAB — CBC
HCT: 21.8 % — ABNORMAL LOW (ref 39.0–52.0)
Hemoglobin: 7.5 g/dL — ABNORMAL LOW (ref 13.0–17.0)
MCH: 41.2 pg — ABNORMAL HIGH (ref 26.0–34.0)
MCHC: 34.4 g/dL (ref 30.0–36.0)
MCV: 119.8 fL — ABNORMAL HIGH (ref 80.0–100.0)
Platelets: 121 10*3/uL — ABNORMAL LOW (ref 150–400)
RBC: 1.82 MIL/uL — ABNORMAL LOW (ref 4.22–5.81)
RDW: 13.8 % (ref 11.5–15.5)
WBC: 25.7 10*3/uL — ABNORMAL HIGH (ref 4.0–10.5)
nRBC: 0 % (ref 0.0–0.2)

## 2019-11-30 LAB — COMPREHENSIVE METABOLIC PANEL
ALT: 38 U/L (ref 0–44)
AST: 61 U/L — ABNORMAL HIGH (ref 15–41)
Albumin: 1.3 g/dL — ABNORMAL LOW (ref 3.5–5.0)
Alkaline Phosphatase: 239 U/L — ABNORMAL HIGH (ref 38–126)
Anion gap: 8 (ref 5–15)
BUN: 10 mg/dL (ref 6–20)
CO2: 29 mmol/L (ref 22–32)
Calcium: 7.8 mg/dL — ABNORMAL LOW (ref 8.9–10.3)
Chloride: 102 mmol/L (ref 98–111)
Creatinine, Ser: 0.7 mg/dL (ref 0.61–1.24)
GFR, Estimated: >60 mL/min (ref >60)
Glucose, Bld: 116 mg/dL — ABNORMAL HIGH (ref 70–99)
Potassium: 3.7 mmol/L (ref 3.5–5.1)
Sodium: 139 mmol/L (ref 135–145)
Total Bilirubin: 16.3 mg/dL — ABNORMAL HIGH (ref 0.3–1.2)
Total Protein: 7.1 g/dL (ref 6.5–8.1)

## 2019-11-30 LAB — CULTURE, BODY FLUID W GRAM STAIN -BOTTLE: Culture: NO GROWTH

## 2019-11-30 LAB — PHOSPHORUS: Phosphorus: 5.5 mg/dL — ABNORMAL HIGH (ref 2.5–4.6)

## 2019-11-30 LAB — PROTIME-INR
INR: 2 — ABNORMAL HIGH (ref 0.8–1.2)
Prothrombin Time: 22 seconds — ABNORMAL HIGH (ref 11.4–15.2)

## 2019-11-30 LAB — AMMONIA: Ammonia: 59 umol/L — ABNORMAL HIGH (ref 9–35)

## 2019-11-30 MED ORDER — PHYTONADIONE 5 MG PO TABS
10.0000 mg | ORAL_TABLET | Freq: Every day | ORAL | Status: AC
  Administered 2019-11-30 – 2019-12-02 (×3): 10 mg via ORAL
  Filled 2019-11-30 (×3): qty 2

## 2019-11-30 MED ORDER — LACTULOSE 10 GM/15ML PO SOLN
30.0000 g | Freq: Two times a day (BID) | ORAL | Status: DC
  Administered 2019-11-30 – 2019-12-04 (×8): 30 g via ORAL
  Filled 2019-11-30 (×8): qty 60

## 2019-11-30 NOTE — Progress Notes (Signed)
PT Cancellation Note  Patient Details Name: Zachary Mata MRN: [id number] DOB: 09/25/1980   Cancelled Treatment:    Reason Eval/Treat Not Completed: Patient declined, no reason specified. Patient wants to sleep right now, will check back later.    Zachary Mata 11/30/2019, 9:32 AM

## 2019-11-30 NOTE — Progress Notes (Signed)
Modified Barium Swallow Progress Note  Patient Details  Name: [name] MRN: [id number] Date of Birth: 02-18-1980  Today's Date: 11/30/2019  Modified Barium Swallow completed.  Full report located under Chart Review in the Imaging Section.  Brief recommendations include the following:  Clinical Impression  Pt presents with a mild oropharyngeal dysphagia. Oral deficits c/b delayed bolus propulsion and mild oral residuals. Pharyngeal deficits c/b reduced base of tongue retraction, decreased laryngeal elevation, and reduced timing and efficiency of laryngeal vestibule closure. This allowed for isolated instance of pre and during the swallow laryngeal penetration (PAS-3) with thin liquids with straw use only. No further penetration or aspiration noted with any POs. Mild vallecular and pyriform sinus residuals noted with POs (greatest with thicker POs) thin liquid alternation or second swallows assisted to decrease residual amount. Pt and spouse educated on diet recommendations and safe swallowing strategies to maximize swallow safety and efficiency.    Swallow Evaluation Recommendations       SLP Diet Recommendations: Regular solids;Thin liquid   Liquid Administration via: Cup;No straw   Medication Administration: Whole meds with puree   Supervision: Patient able to self feed;Full supervision/cueing for compensatory strategies   Compensations: Minimize environmental distractions;Slow rate;Small sips/bites;Follow solids with liquid   Postural Changes: Remain semi-upright after after feeds/meals (Comment)   Oral Care Recommendations: Oral care BID        Evolette Pendell E Cortney Beissel MA, CCC-SLP  Acute Rehabilitation Services  11/30/2019,3:07 PM

## 2019-11-30 NOTE — Consult Note (Signed)
°  Writer attempted to assist patient, however patient does not appear to be alert and oriented.  He continues to fall asleep, and is unable to complete the evaluation.  Writer was able to obtain some collateral information from his significant other Katrina Seelman, who was at the bedside.  As per wife" I am glad you are here.  I requested the consult not him.  I had some concerns regarding his alcohol use, in the setting of depression and anxiety."  She states her concerns are due to to manage anxiety and depression that led to his excessive drinking.  She denies any previous psychiatric history, however reports seeing a primary care provider this year and being prescribed BuSpar.  She states he took the BuSpar for approximately 6 weeks, noting discontinuation due to being ineffective.  She states his longest period of sobriety was 3 months in 2018.  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  She states about 2 years ago he would drink 15-18 beers a day.  She also reports a strong family history of substance abuse and alcohol addiction, notably maternal aunt died of liver cancer and cirrhosis secondary to alcohol consumption.  He also reports his father has alcohol issues as well.  She reports history of DUI, that resulted in a brief admission to a substance abuse rehabilitation.  Wife states patient did sign himself out of the facility as it was voluntary, noting he will likely not return to 1 of those facilities.  This Clinical research associate did educate his wife about the benefits of inpatient psychiatric admission versus inpatient substance abuse rehabilitation.  Writer did state with revisit this topic with patient, in order to ensure a mutually agreed upon treatment plan by both parties.  We also discussed SAIOP as an additional option.  At this time patient is not medically cleared or stable for discharge, will  attempt to reassess tomorrow.  -Continue CIWA protocol that is currently in place. -Patient not medically stable, continues to have altered mental status will attempt to reassess tomorrow. -Reviewed the medications, treatment options, and substance abuse facilities with his wife, will return prior to making final disposition. Per wife patient is not going to be open to Substance/Alchohol Use facility.

## 2019-11-30 NOTE — Progress Notes (Signed)
PROGRESS NOTE    Zachary Mata  VOZ:[id number] DOB: 07-Mar-1980 DOA: 11/25/2019 PCP: Monica Becton, MD    Chief Complaint  Patient presents with  . Abdominal Pain  . Chest Pain    Brief Narrative:   39 year old gentleman with prior history of hypertension, daily alcohol abuse presents to ED with jaundice, abdominal distention, back pain. He was admitted for acute decompensated liver failure secondary to EtOH abuse associated with hyperbilirubinemia, portal hypertension, splenomegaly, esophageal varices, ascites. GI consulted for further evaluation.  Diagnostic paracentesis with fluid studies have been ordered.  Patient was started empirically on IV Rocephin for possible SBP. Th cultures from the ascitic fluid negative so far.  Patient seen and examined at bedside for bowel movements in the last 24 hours.  Patient is more alert and awake and answering questions.  Discussed the plan with wife at bedside.  PT/psychiatry evaluation ordered.  Assessment & Plan:   Principal Problem:   Alcoholic hepatitis without ascites Active Problems:   Essential hypertension   Alcohol abuse   Jaundice due to hepatitis   Hypokalemia   Alcohol cessation counseling   Alcohol withdrawal (HCC)   Liver failure, acute   Decompensated liver cirrhosis with ascites Associated with EtOH hepatitis, CT of the abdomen pelvis shows liver cirrhosis, splenomegaly, esophageal varices, portal hypertension and ascites. GI on board and appreciate recommendations.  Ultrasound paracentesis on admission and ascites fluid negative for infection.  He was initially started on Rocephin later on discontinued. Patient was started on lactulose 30 g twice daily, rifaximin, Lasix 40 mg daily and spironolactone 100 mg daily by gastroenterology. Maddrey discriminant function is 44, steroids started.  Bilirubin improved to 16 . No changes in meds.     Ascites:  S/p diagnostic paracentesis, cultures negative.  He was  started on Lasix 40 mg daily  and spironolactone 100 mg DAILY.   Small bowel and colon wall thickening Incidental finding on CT abdomen.  Pt currently is not nauseated or vomiting. Wife at bedside reported vomiting prior to admission.  Slight nausea today recommended to use Zofran.   Essential Hypertension;  Well-controlled   Hypokalemia: replaced.    Alcohol abuse:  TOC consulted for resources.  On CIWA protocol for alcohol withdrawal symptoms.     Anemia or thrombocytopenia:  Probably secondary to liver cirrhosis.  Platelets improved to 124 today  Aspiration pneumonia Chest x-ray shows Layering small left pleural effusion with overlying left basilar opacities, concerning for aspiration and/or pneumonia.  We will change IV Rocephin to Unasyn for possible aspiration.  Started the patient on Unasyn, recommend at least 5 days of IV Unasyn. SLP evaluation ordered, recommending MBS.  Hypoalbuminemia:  From liver cirrhosis.  Albumin ranging between 1.2-1.4    Esophageal varices:  Will probably need an EGD in the future.    Hyponatremia:  Probably from hypervolemic hyponatremia.  Improved to 133 after Lasix  Acute hepatic encephalopathy:  Elevated ammonia levels improved from 82-56 Continue with lactulose  Anxiety and depression:  Will  Get psych evaluation once pt is alert and oriented and able to maintain a conversation.    Recommend PT evaluation patient is more alert and is able to participate  DVT prophylaxis:scd's Code Status: (FULL CODE Family Communication: Family at bedside.  Disposition:   Status is: Inpatient  The patient will require care spanning > 2 midnights and should be moved to inpatient because: Hemodynamically unstable, Ongoing diagnostic testing needed not appropriate for outpatient work up, Unsafe d/c plan, IV treatments appropriate  due to intensity of illness or inability to take PO and Inpatient level of care appropriate due to severity of  illness  Dispo: The patient is from: Home              Anticipated d/c is to: pending              Anticipated d/c date is: > 3 days              Patient currently is not medically stable to d/c.       Consultants:   Gastroenterology    Procedures; Paracentesis about 820 ml fluid taken off.   Antimicrobials: rocephin sine admission   Subjective: Patient is more alert and awake and is answering questions.  Reports he has nausea and is requesting pain medication around-the-clock   Objective: Vitals:   11/30/19 0024 11/30/19 0414 11/30/19 0758 11/30/19 1202  BP: 108/69 106/75 114/78 104/71  Pulse: 81 83 83 84  Resp: 13 14 14 10   Temp: 98.4 F (36.9 C) 98.3 F (36.8 C) 98.4 F (36.9 C) 99 F (37.2 C)  TempSrc: Oral Oral Oral Oral  SpO2: 94% 93%  96%  Weight:      Height:        Intake/Output Summary (Last 24 hours) at 11/30/2019 1506 Last data filed at 11/30/2019 12/02/2019 Gross per 24 hour  Intake --  Output 1550 ml  Net -1550 ml   Filed Weights   11/28/19 1643  Weight: 62.2 kg    Examination:  Zachary exam: Ill-appearing gentleman jaundiced appears comfortable not in distress is on room air Respiratory system: Air entry fair bilateral, no wheezing or rhonchi  no tachypnea Cardiovascular system: S1-S2 heard, regular rate rhythm, no JVD Gastrointestinal system: Diminished from, distended, bowel sounds normal, no tenderness  Central nervous system: More alert and able to answer questions able to move all extremities Extremities: Improving leg edema Skin: spider naevi present.  Psychiatry: Could not be assessed    Data Reviewed: I have personally reviewed following labs and imaging studies  CBC: Recent Labs  Lab 11/26/19 0212 11/27/19 0226 11/28/19 0257 11/29/19 0404 11/30/19 0441  WBC 19.6* 23.0* 25.6* 19.0* 25.7*  NEUTROABS 15.8*  --   --   --   --   HGB 7.4* 7.4* 7.5* 7.6* 7.5*  HCT 21.6* 21.5* 21.4* 22.4* 21.8*  MCV 116.8* 116.2* 115.7*  117.3* 119.8*  PLT 101* 103* 125* 124* 121*    Basic Metabolic Panel: Recent Labs  Lab 11/25/19 0050 11/25/19 0417 11/25/19 0843 11/25/19 0843 11/26/19 0212 11/27/19 0226 11/28/19 0257 11/29/19 0404 11/30/19 0441  NA   < >  --  130*   < > 131* 131* 133* 136 139  K   < >  --  4.0   < > 3.4* 3.5 3.7 3.5 3.7  CL   < >  --  93*   < > 95* 96* 97* 98 102  CO2   < >  --  25   < > 25 26 28 28 29   GLUCOSE   < >  --  97   < > 53* 189* 121* 117* 116*  BUN   < >  --  <5*   < > <5* 6 8 8 10   CREATININE   < >  --  0.63   < > 0.55* 0.72 0.69 0.71 0.70  CALCIUM   < >  --  7.3*   < > 7.5* 7.8* 8.0* 7.8* 7.8*  MG  --  1.7  --   --   --   --   --   --   --   PHOS  --   --  4.4  --   --   --   --   --   --    < > = values in this interval not displayed.    GFR: Estimated Creatinine Clearance: 110.1 mL/min (by C-G formula based on SCr of 0.7 mg/dL).  Liver Function Tests: Recent Labs  Lab 11/26/19 0212 11/27/19 0226 11/28/19 0257 11/29/19 0404 11/30/19 0441  AST 103* 93* 70* 71* 61*  ALT 26 26 26  32 38  ALKPHOS 261* 227* 247* 235* 239*  BILITOT 16.8* 20.7* 20.6* 19.2* 16.3*  PROT 7.1 7.1 7.3 7.5 7.1  ALBUMIN 1.4* 1.2* 1.3* 1.4* 1.3*    CBG: No results for input(s): GLUCAP in the last 168 hours.   Recent Results (from the past 240 hour(s))  Respiratory Panel by RT PCR (Flu A&B, Covid) - Nasopharyngeal Swab     Status: None   Collection Time: 11/25/19  4:24 AM   Specimen: Nasopharyngeal Swab  Result Value Ref Range Status   SARS Coronavirus 2 by RT PCR NEGATIVE NEGATIVE Final    Comment: (NOTE) SARS-CoV-2 target nucleic acids are NOT DETECTED.  The SARS-CoV-2 RNA is generally detectable in upper respiratoy specimens during the acute phase of infection. The lowest concentration of SARS-CoV-2 viral copies this assay can detect is 131 copies/mL. A negative result does not preclude SARS-Cov-2 infection and should not be used as the sole basis for treatment or other patient  management decisions. A negative result may occur with  improper specimen collection/handling, submission of specimen other than nasopharyngeal swab, presence of viral mutation(s) within the areas targeted by this assay, and inadequate number of viral copies (<131 copies/mL). A negative result must be combined with clinical observations, patient history, and epidemiological information. The expected result is Negative.  Fact Sheet for Patients:  11/27/19  Fact Sheet for Healthcare Providers:  https://www.moore.com/  This test is no t yet approved or cleared by the https://www.young.biz/ FDA and  has been authorized for detection and/or diagnosis of SARS-CoV-2 by FDA under an Emergency Use Authorization (EUA). This EUA will remain  in effect (meaning this test can be used) for the duration of the COVID-19 declaration under Section 564(b)(1) of the Act, 21 U.S.C. section 360bbb-3(b)(1), unless the authorization is terminated or revoked sooner.     Influenza A by PCR NEGATIVE NEGATIVE Final   Influenza B by PCR NEGATIVE NEGATIVE Final    Comment: (NOTE) The Xpert Xpress SARS-CoV-2/FLU/RSV assay is intended as an aid in  the diagnosis of influenza from Nasopharyngeal swab specimens and  should not be used as a sole basis for treatment. Nasal washings and  aspirates are unacceptable for Xpert Xpress SARS-CoV-2/FLU/RSV  testing.  Fact Sheet for Patients: [place]  Fact Sheet for Healthcare Providers: https://www.moore.com/  This test is not yet approved or cleared by the https://www.young.biz/ FDA and  has been authorized for detection and/or diagnosis of SARS-CoV-2 by  FDA under an Emergency Use Authorization (EUA). This EUA will remain  in effect (meaning this test can be used) for the duration of the  Covid-19 declaration under Section 564(b)(1) of the Act, 21  U.S.C. section 360bbb-3(b)(1),  unless the authorization is  terminated or revoked. Performed at [facility], [address], [place], [place]   Gram stain  Status: None   Collection Time: 11/25/19  4:17 PM   Specimen: Abdomen; Peritoneal Fluid  Result Value Ref Range Status   Specimen Description FLUID PERITONEAL ABDOMEN  Final   Special Requests NONE  Final   Gram Stain   Final    WBC PRESENT, PREDOMINANTLY MONONUCLEAR RED BLOOD CELLS PRESENT NO ORGANISMS SEEN CYTOSPIN SMEAR Performed at [facility], [address], [place], [place] [address]    Report Status 11/25/2019 FINAL  Final  Culture, body fluid-bottle     Status: None   Collection Time: 11/25/19  4:17 PM   Specimen: Fluid  Result Value Ref Range Status   Specimen Description FLUID PERITONEAL ABDOMEN  Final   Special Requests BOTTLES DRAWN AEROBIC AND ANAEROBIC  Final   Culture   Final    NO GROWTH 5 DAYS Performed at [facility], [address], [place], [place] [address]    Report Status 11/30/2019 FINAL  Final         Radiology Studies: DG Swallowing Func-Speech Pathology  Result Date: 11/30/2019 Objective Swallowing Evaluation: Type of Study: MBS-Modified Barium Swallow Study  Patient Details Name: Zachary Mata MRN: [id number] Date of Birth: 1980-05-26 Today's Date: 11/30/2019 Time: SLP Start Time (ACUTE ONLY): 1410 -SLP Stop Time (ACUTE ONLY): 1435 SLP Time Calculation (min) (ACUTE ONLY): 25 min Past Medical History: Past Medical History: Diagnosis Date . Chronic back pain 03/02/2012 . Essential hypertension 03/02/2012 . Hypertension  Past Surgical History: Past Surgical History: Procedure Laterality Date . HAND SURGERY   . IR PARACENTESIS  11/25/2019 . WRIST SURGERY   HPI: 39 year old gentleman with prior history of hypertension, daily alcohol abuse presents to ED with jaundice, abdominal distention, back pain. He was admitted for acute decompensated liver failure secondary to EtOH abuseassociated with  hyperbilirubinemia, portal hypertension, splenomegaly, esophageal varices, ascites.  CXR on 10/23 "concerning for aspiration and/or pneumonia".   Subjective: alert upright in chair for procedure Assessment / Plan / Recommendation CHL IP CLINICAL IMPRESSIONS 11/30/2019 Clinical Impression Pt presents with a mild oropharyngeal dysphagia. Oral deficits c/b delayed bolus propulsion and mild oral residuals. Pharyngeal deficits c/b reduced base of tongue retraction, decreased laryngeal elevation, and reduced timing and efficiency of laryngeal vestibule closure. This allowed for isolated instance of pre and during the swallow laryngeal penetration (PAS-3) with thin liquids with straw use only. No further penetration or aspiration noted with any POs. Mild vallecular and pyriform sinus residuals noted with POs (greatest with thicker POs) thin liquid alternation or second swallows assisted to decrease residual amount. Pt and spouse educated on diet recommendations and safe swallowing strategies to maximize swallow safety and efficiency.  SLP Visit Diagnosis Dysphagia, oropharyngeal phase (R13.12) Attention and concentration deficit following -- Frontal lobe and executive function deficit following -- Impact on safety and function Mild aspiration risk   CHL IP TREATMENT RECOMMENDATION 11/30/2019 Treatment Recommendations Therapy as outlined in treatment plan below   Prognosis 11/30/2019 Prognosis for Safe Diet Advancement Good Barriers to Reach Goals -- Barriers/Prognosis Comment -- CHL IP DIET RECOMMENDATION 11/30/2019 SLP Diet Recommendations Regular solids;Thin liquid Liquid Administration via Cup;No straw Medication Administration Whole meds with puree Compensations Minimize environmental distractions;Slow rate;Small sips/bites;Follow solids with liquid Postural Changes Remain semi-upright after after feeds/meals (Comment)   CHL IP OTHER RECOMMENDATIONS 11/30/2019 Recommended Consults -- Oral Care Recommendations Oral care  BID Other Recommendations --   CHL IP FOLLOW UP RECOMMENDATIONS 11/30/2019 Follow up Recommendations 24 hour supervision/assistance   CHL IP FREQUENCY AND DURATION 11/30/2019 Speech  Therapy Frequency (ACUTE ONLY) min 1 x/week Treatment Duration 1 week      CHL IP ORAL PHASE 11/30/2019 Oral Phase Impaired Oral - Pudding Teaspoon -- Oral - Pudding Cup -- Oral - Honey Teaspoon -- Oral - Honey Cup -- Oral - Nectar Teaspoon -- Oral - Nectar Cup Weak lingual manipulation;Reduced posterior propulsion;Delayed oral transit;Lingual/palatal residue Oral - Nectar Straw -- Oral - Thin Teaspoon -- Oral - Thin Cup Delayed oral transit;Lingual/palatal residue Oral - Thin Straw Delayed oral transit;Lingual/palatal residue Oral - Puree Delayed oral transit;Reduced posterior propulsion;Lingual/palatal residue Oral - Mech Soft Delayed oral transit;Lingual/palatal residue;Piecemeal swallowing;Reduced posterior propulsion Oral - Regular -- Oral - Multi-Consistency -- Oral - Pill Delayed oral transit;Lingual/palatal residue Oral Phase - Comment --  CHL IP PHARYNGEAL PHASE 11/30/2019 Pharyngeal Phase Impaired Pharyngeal- Pudding Teaspoon -- Pharyngeal -- Pharyngeal- Pudding Cup -- Pharyngeal -- Pharyngeal- Honey Teaspoon -- Pharyngeal -- Pharyngeal- Honey Cup -- Pharyngeal -- Pharyngeal- Nectar Teaspoon -- Pharyngeal -- Pharyngeal- Nectar Cup Reduced tongue base retraction;Pharyngeal residue - valleculae Pharyngeal -- Pharyngeal- Nectar Straw -- Pharyngeal -- Pharyngeal- Thin Teaspoon -- Pharyngeal -- Pharyngeal- Thin Cup Penetration/Aspiration during swallow;Reduced tongue base retraction;Reduced laryngeal elevation;Pharyngeal residue - valleculae;Pharyngeal residue - pyriform Pharyngeal Material does not enter airway Pharyngeal- Thin Straw Penetration/Aspiration before swallow;Penetration/Aspiration during swallow;Pharyngeal residue - pyriform;Pharyngeal residue - valleculae;Reduced epiglottic inversion;Reduced tongue base  retraction;Reduced laryngeal elevation Pharyngeal Material enters airway, remains ABOVE vocal cords and not ejected out Pharyngeal- Puree Delayed swallow initiation-vallecula;Reduced tongue base retraction;Reduced laryngeal elevation;Pharyngeal residue - valleculae;Pharyngeal residue - pyriform Pharyngeal -- Pharyngeal- Mechanical Soft Pharyngeal residue - valleculae;Pharyngeal residue - pyriform;Reduced laryngeal elevation;Reduced tongue base retraction Pharyngeal -- Pharyngeal- Regular -- Pharyngeal -- Pharyngeal- Multi-consistency -- Pharyngeal -- Pharyngeal- Pill Pharyngeal residue - valleculae;Pharyngeal residue - pyriform;Reduced laryngeal elevation;Reduced tongue base retraction Pharyngeal -- Pharyngeal Comment --  CHL IP CERVICAL ESOPHAGEAL PHASE 11/30/2019 Cervical Esophageal Phase WFL Pudding Teaspoon -- Pudding Cup -- Honey Teaspoon -- Honey Cup -- Nectar Teaspoon -- Nectar Cup -- Nectar Straw -- Thin Teaspoon -- Thin Cup -- Thin Straw -- Puree -- Mechanical Soft -- Regular -- Multi-consistency -- Pill -- Cervical Esophageal Comment -- Chelsea E Hartness MA, CCC-SLP 11/30/2019, 3:05 PM                   Scheduled Meds: . furosemide  40 mg Oral Daily  . lactulose  30 g Oral BID  . nicotine  21 mg Transdermal Daily  . phytonadione  10 mg Oral Daily  . potassium & sodium phosphates  1 packet Oral TID WC & HS  . prednisoLONE  40 mg Oral Daily  . rifaximin  550 mg Oral BID  . spironolactone  100 mg Oral Daily  . thiamine  100 mg Oral Daily   Continuous Infusions: . sodium chloride 1,000 mL (11/28/19 1826)  . ampicillin-sulbactam (UNASYN) IV 3 g (11/30/19 1251)     LOS: 4 days     Kathlen ModyVijaya Angelina Neece, MD [facility]   To contact the attending provider between 7A-7P or the covering provider during after hours 7P-7A, please log into the web site [email or web address] and access using universal [place] password for that web site. If you do not have the password, please call the hospital  operator.  11/30/2019, 3:06 PM

## 2019-11-30 NOTE — Progress Notes (Signed)
  Speech Language Pathology Treatment: Dysphagia  Patient Details Name: Zachary Mata MRN: [id number] DOB: 1980/06/26 Today's Date: 11/30/2019 Time: 1120-1140 SLP Time Calculation (min) (ACUTE ONLY): 20 min  Assessment / Plan / Recommendation Clinical Impression  Followed up for dysphagia intervention. Pt with improvements in mentation, spouse present at bedside. Assessed with thin liquids via cup and straw. Intermittent throat clear exhibited as well as suspected delay in swallow initiation however vocal quality remained clear. Will proceed with instrumental assessment to objectively assess aspiration risk. MBSS planned this afternoon.    HPI HPI: 39 year old gentleman with prior history of hypertension, daily alcohol abuse presents to ED with jaundice, abdominal distention, back pain. He was admitted for acute decompensated liver failure secondary to EtOH abuseassociated with hyperbilirubinemia, portal hypertension, splenomegaly, esophageal varices, ascites.  CXR on 10/23 "concerning for aspiration and/or pneumonia".        SLP Plan  MBS       Recommendations  Diet recommendations: Other(comment) (pending MBSS completion this afternoon )                Oral Care Recommendations: Oral care BID;Staff/trained caregiver to provide oral care Follow up Recommendations: 24 hour supervision/assistance;Skilled Nursing facility SLP Visit Diagnosis: Dysphagia, unspecified (R13.10) Plan: MBS       GO                Zachary Bamba E Felicidad Sugarman MA, CCC-SLP  Acute Rehabilitation Services  11/30/2019, 12:51 PM

## 2019-11-30 NOTE — Progress Notes (Addendum)
Daily Rounding Note  11/30/2019, 11:10 AM  LOS: 4 days   SUBJECTIVE:   Chief complaint:   Acute alcoholic hepatitis, cirrhosis, alcohol abuse.  PT did not work w pt this AM as he was sleeping.   Periodic po and IV ativan continue.   Received 1 mg Morphine yesterday for back pain.   BPs and HR stable.  ~ 5 loose, brown stools yesterday.  Some abdominal and back pain.      OBJECTIVE:         Vital signs in last 24 hours:    Temp:  [97.8 F (36.6 C)-98.4 F (36.9 C)] 98.4 F (36.9 C) (10/26 0758) Pulse Rate:  [73-83] 83 (10/26 0758) Resp:  [10-14] 14 (10/26 0758) BP: (104-114)/(67-78) 114/78 (10/26 0758) SpO2:  [93 %-97 %] 93 % (10/26 0414) Last BM Date: 11/29/19 Filed Weights   11/28/19 1643  Weight: 62.2 kg   General: alert.  comfortable Heart: RRR Chest: clear bil.  Voice stronger and less mucoid.  No cough Abdomen: soft, slightly distended, NT.  BS active  Extremities: no CCE Neuro/Psych:  Oriented x 3.  Slight hand tremor vs minor asterixis.  No asterixis CEG  Intake/Output from previous day: 10/25 0701 - 10/26 0700 In: 340 [P.O.:240; IV Piggyback:100] Out: 2150 [Urine:2150]   Lab Results: Recent Labs    11/28/19 0257 11/29/19 0404 11/30/19 0441  WBC 25.6* 19.0* 25.7*  HGB 7.5* 7.6* 7.5*  HCT 21.4* 22.4* 21.8*  PLT 125* 124* 121*   BMET Recent Labs    11/28/19 0257 11/29/19 0404 11/30/19 0441  NA 133* 136 139  K 3.7 3.5 3.7  CL 97* 98 102  CO2 $Re'28 28 29  'NZL$ GLUCOSE 121* 117* 116*  BUN $Re'8 8 10  'Fsk$ CREATININE 0.69 0.71 0.70  CALCIUM 8.0* 7.8* 7.8*   LFT Recent Labs    11/28/19 0257 11/29/19 0404 11/30/19 0441  PROT 7.3 7.5 7.1  ALBUMIN 1.3* 1.4* 1.3*  AST 70* 71* 61*  ALT 26 32 38  ALKPHOS 247* 235* 239*  BILITOT 20.6* 19.2* 16.3*  BILIDIR 13.3*  --   --    PT/INR Recent Labs    11/29/19 0404 11/30/19 0441  LABPROT 19.9* 22.0*  INR 1.8* 2.0*      ASSESMENT:   *     Alcoholic hepatitis and possible cirrhosis.   Hep B, C serologies negative.   IgG elevated at 2221 Smooth muscle Ab 22 (rr is 0-19) ANA negative.  Mitochondrial Ab <20, normal Ceruloplasmin 18.2, normal.   Decompensated w portal hth, splenomegaly, esoph varices, ascites on CTAP.  Disc fx score >32, Prednisolone day 5.  T bili better 20.7 >> 16.3.  Alk phos and transaminases also improve.    *   Coagulopathy (INR 2.4 >> 1.8 >> 2, no Vit K thus far),  Thrombocytopenia (101 >> 121,  48 in 2019)  *   Ascites.  820 cc paracentesis on 10/21: no SBP Renal function ok.   Diuretics updosed yesterday and currently are: Aldactone 100 mg/day. Furosemide 40 mg/day.       *   Chronic alcoholism and dependence.  Dates back many years.  Last ETOH was late PM of 10/20.    *   Dysphagia.  Choking w thins per staff observation and on today's BS swallow eval.   Moderate aspiration risk.     SLP BS swallow eval 10/25: ordered regular, thin liquids.  Nectar liquids recommended but pt's  wife wants thin liquids, understanding aspiration risk.    *   Macrocytic anemia.  Hgb 12.9 >> 7.5.  Was 15.7 in late August 2021.  B12 elevated.  No folate assay   *   AMS.  Suspect ETOH withdrawal and HE.  Ammonia 59.  High dose lactulose, rifaximin in place.  5 BM's yesterday.    *   Aspiration PNA, Unasyn day 3.  Previous Rocephin.  WBCs 25.6 >> 19 > 25.7.  No fever.     PLAN   *  Lille score on 10/28.   Folate level in AM.    *    Po Vit K for next 3 days.   *   Decrease lactulose dose to 30 g bid.       Azucena Freed  11/30/2019, 11:10 AM Phone [place] Attending   I have taken an interval history, reviewed the chart and examined the patient. I agree with the Advanced Practitioner's note, impression and recommendations.   Labs show slight improvement in bili and NH3 He is sluggish/somnolent but no asterixis and a and o x 3  Needs daily wgts  Explained to wife that some  hopeful signs here but need to see how this plays out  Gilbert score 10/28 and will repeat discriminant fx then too.   Also explained dc criteria some - needs PT, etc and could need SNF vs home health - will be important to see what happens with PT  Wife has requested that our current PA not round on him - agreed - MD or other APPs will see  Gatha Mayer, MD, [facility] 11/30/2019 12:49 PM

## 2019-11-30 NOTE — Evaluation (Signed)
Physical Therapy Evaluation Patient Details Name: Zachary Mata MRN: [id number] DOB: Jun 27, 1980 Today's Date: 11/30/2019   History of Present Illness  Zachary Mata is a 39 y.o. male with medical history significant for hypertension, ED, daily alcohol use presents with chief concern of yellowing skin, abdominal swelling x 4 weeks.  He presents today because he was not feeling well. He reports he drinks etoh daily and does not count the number of drinks he has. He drinks throughout the day and his last drink was approximately 11 pm.  Clinical Impression  Patient received in bed, wife at bedside. Patient lethargic with flat affect. Oriented. He is agreeable to PT evaluation. He performed bed mobility with mod independence, transfers with min assist. He is unsteady with standing and walking. Requires B UE support for safety. He was able to walk 25 feet with hand held assist and holding to IV pole. Would benefit from RW. He is limited by fatigue. Patient will continue to benefit from skilled PT to improve functional mobility, strength and safety for return home.      Follow Up Recommendations Home health PT;Supervision/Assistance - 24 hour    Equipment Recommendations  Rolling walker with 5" wheels    Recommendations for Other Services       Precautions / Restrictions Precautions Precautions: Fall Restrictions Weight Bearing Restrictions: No      Mobility  Bed Mobility Overal bed mobility: Modified Independent             General bed mobility comments: increased time and effort needed, but no physical assist Patient Response: Flat affect  Transfers Overall transfer level: Needs assistance Equipment used: None Transfers: Sit to/from Stand Sit to Stand: Min assist         General transfer comment: requires min assist to power up to standing  Ambulation/Gait Ambulation/Gait assistance: Min assist Gait Distance (Feet): 25 Feet Assistive device: IV Pole;1 person hand held  assist Gait Pattern/deviations: Step-to pattern;Shuffle;Decreased step length - right;Decreased step length - left;Wide base of support Gait velocity: decr   General Gait Details: patient generally unsteady with standing/walking. Requires B UE support for steadying. Slow pace.  Stairs            Wheelchair Mobility    Modified Rankin (Stroke Patients Only)       Balance Overall balance assessment: Needs assistance Sitting-balance support: Feet supported Sitting balance-Leahy Scale: Fair Sitting balance - Comments: unsupported but supervision for safety   Standing balance support: Bilateral upper extremity supported;During functional activity Standing balance-Leahy Scale: Poor Standing balance comment: reliant on B UE support for balance                             Pertinent Vitals/Pain Pain Assessment: Faces Faces Pain Scale: Hurts little more Pain Location: head, back Pain Descriptors / Indicators: Aching;Discomfort Pain Intervention(s): Monitored during session;Limited activity within patient's tolerance;Repositioned    Home Living Family/patient expects to be discharged to:: Private residence Living Arrangements: Spouse/significant other Available Help at Discharge: Family Type of Home: House Home Access: Stairs to enter   Secretary/administrator of Steps: 4   Home Equipment: None      Prior Function Level of Independence: Independent               Hand Dominance        Extremity/Trunk Assessment   Upper Extremity Assessment Upper Extremity Assessment: Generalized weakness    Lower Extremity Assessment Lower Extremity Assessment:  Generalized weakness    Cervical / Trunk Assessment Cervical / Trunk Assessment: Normal  Communication   Communication: No difficulties  Cognition Arousal/Alertness: Lethargic Behavior During Therapy: Flat affect Overall Cognitive Status: Impaired/Different from baseline Area of Impairment:  Awareness;Problem solving;Safety/judgement                       Following Commands: Follows one step commands with increased time     Problem Solving: Slow processing;Requires verbal cues        General Comments      Exercises     Assessment/Plan    PT Assessment Patient needs continued PT services  PT Problem List Decreased strength;Decreased mobility;Decreased activity tolerance;Decreased balance;Pain;Decreased safety awareness;Decreased knowledge of use of DME       PT Treatment Interventions DME instruction;Therapeutic activities;Gait training;Therapeutic exercise;Patient/family education;Stair training;Balance training;Functional mobility training    PT Goals (Current goals can be found in the Care Plan section)  Acute Rehab PT Goals Patient Stated Goal: to return home PT Goal Formulation: With patient/family Time For Goal Achievement: 12/14/19 Potential to Achieve Goals: Fair    Frequency Min 2X/week   Barriers to discharge        Co-evaluation               AM-PAC PT "6 Clicks" Mobility  Outcome Measure Help needed turning from your back to your side while in a flat bed without using bedrails?: None Help needed moving from lying on your back to sitting on the side of a flat bed without using bedrails?: None Help needed moving to and from a bed to a chair (including a wheelchair)?: A Little Help needed standing up from a chair using your arms (e.g., wheelchair or bedside chair)?: A Little Help needed to walk in hospital room?: A Lot Help needed climbing 3-5 steps with a railing? : A Lot 6 Click Score: 18    End of Session Equipment Utilized During Treatment: Gait belt Activity Tolerance: Patient limited by lethargy Patient left: in bed;with call bell/phone within reach;with bed alarm set;with family/visitor present Nurse Communication: Mobility status PT Visit Diagnosis: Unsteadiness on feet (R26.81);Muscle weakness (generalized)  (M62.81);Difficulty in walking, not elsewhere classified (R26.2);History of falling (Z91.81)    Time: 0973-5329 PT Time Calculation (min) (ACUTE ONLY): 15 min   Charges:   PT Evaluation $PT Eval Moderate Complexity: 1 Mod PT Treatments $Gait Training: 8-22 mins        Sehar Sedano, PT, GCS 11/30/19,1:25 PM

## 2019-12-01 ENCOUNTER — Inpatient Hospital Stay (HOSPITAL_COMMUNITY): Payer: Commercial Managed Care - PPO

## 2019-12-01 DIAGNOSIS — K7011 Alcoholic hepatitis with ascites: Secondary | ICD-10-CM | POA: Diagnosis not present

## 2019-12-01 DIAGNOSIS — M79609 Pain in unspecified limb: Secondary | ICD-10-CM | POA: Diagnosis not present

## 2019-12-01 DIAGNOSIS — K7201 Acute and subacute hepatic failure with coma: Secondary | ICD-10-CM | POA: Diagnosis not present

## 2019-12-01 DIAGNOSIS — M7989 Other specified soft tissue disorders: Secondary | ICD-10-CM

## 2019-12-01 LAB — CBC
HCT: 21.2 % — ABNORMAL LOW (ref 39.0–52.0)
Hemoglobin: 7.4 g/dL — ABNORMAL LOW (ref 13.0–17.0)
MCH: 41.6 pg — ABNORMAL HIGH (ref 26.0–34.0)
MCHC: 34.9 g/dL (ref 30.0–36.0)
MCV: 119.1 fL — ABNORMAL HIGH (ref 80.0–100.0)
Platelets: 120 10*3/uL — ABNORMAL LOW (ref 150–400)
RBC: 1.78 MIL/uL — ABNORMAL LOW (ref 4.22–5.81)
RDW: 13.7 % (ref 11.5–15.5)
WBC: 24.8 10*3/uL — ABNORMAL HIGH (ref 4.0–10.5)
nRBC: 0 % (ref 0.0–0.2)

## 2019-12-01 LAB — PROTIME-INR
INR: 2 — ABNORMAL HIGH (ref 0.8–1.2)
Prothrombin Time: 21.9 seconds — ABNORMAL HIGH (ref 11.4–15.2)

## 2019-12-01 LAB — COMPREHENSIVE METABOLIC PANEL
ALT: 41 U/L (ref 0–44)
AST: 56 U/L — ABNORMAL HIGH (ref 15–41)
Albumin: 1.3 g/dL — ABNORMAL LOW (ref 3.5–5.0)
Alkaline Phosphatase: 219 U/L — ABNORMAL HIGH (ref 38–126)
Anion gap: 10 (ref 5–15)
BUN: 9 mg/dL (ref 6–20)
CO2: 27 mmol/L (ref 22–32)
Calcium: 7.8 mg/dL — ABNORMAL LOW (ref 8.9–10.3)
Chloride: 100 mmol/L (ref 98–111)
Creatinine, Ser: 0.74 mg/dL (ref 0.61–1.24)
GFR, Estimated: >60 mL/min (ref >60)
Glucose, Bld: 107 mg/dL — ABNORMAL HIGH (ref 70–99)
Potassium: 3.8 mmol/L (ref 3.5–5.1)
Sodium: 137 mmol/L (ref 135–145)
Total Bilirubin: 13.4 mg/dL — ABNORMAL HIGH (ref 0.3–1.2)
Total Protein: 6.9 g/dL (ref 6.5–8.1)

## 2019-12-01 LAB — AMMONIA: Ammonia: 50 umol/L — ABNORMAL HIGH (ref 9–35)

## 2019-12-01 MED ORDER — PANTOPRAZOLE SODIUM 40 MG PO TBEC
40.0000 mg | DELAYED_RELEASE_TABLET | Freq: Every day | ORAL | Status: DC
  Administered 2019-12-01 – 2019-12-04 (×4): 40 mg via ORAL
  Filled 2019-12-01 (×4): qty 1

## 2019-12-01 NOTE — Progress Notes (Signed)
Pharmacy Antibiotic Note  Zachary Mata is a 39 y.o. male on day #4 Unasyn for aspiration pneumonia. Had previously received empiric Ceftriaxone for 2 days for possible SBP > stopped due to no SBP.    Renal function stable, Unasyn regimen remains appropriate.   Tmax 99, WBC up but on Prednisolone 40 mg daily.  Plan:  Continue Unasyn 3gm IV q6hrs.  Noted plan for at least 5 days of Unasyn.  Height: 5\' 6"  (167.6 cm) Weight: 60.9 kg (134 lb 4.2 oz) IBW/kg (Calculated) : 63.8  Temp (24hrs), Avg:98.5 F (36.9 C), Min:98 F (36.7 C), Max:99 F (37.2 C)  Recent Labs  Lab 11/27/19 0226 11/28/19 0257 11/29/19 0404 11/30/19 0441 12/01/19 0414  WBC 23.0* 25.6* 19.0* 25.7* 24.8*  CREATININE 0.72 0.69 0.71 0.70 0.74    Estimated Creatinine Clearance: 107.8 mL/min (by C-G formula based on SCr of 0.74 mg/dL).    Allergies  Allergen Reactions   Sulfa Antibiotics Swelling    Antimicrobials this admission: Ceftriaxone 10/21>>10/22 Unasyn 10/24>>  Dose adjustments this admission:  n/a  Microbiology results: 10/21 peritoneal fluid: negative 10/21 COVID and flu: negative  Thank you for allowing pharmacy to be a part of this patients care.  11/21, Dennie Fetters Phone: [place] 12/01/2019 10:17 AM

## 2019-12-01 NOTE — Consult Note (Signed)
°  Writer attempted to assess patient, however patient does not appear to be alert and oriented.  He continues to fall asleep, and is unable to complete the evaluation. He recently received Morphine prior to entering the room. He continues to receive significant medical treatment and is unable to participate in psych evaluation. Will revisit in 2 days to see if he is able to cooperate and participate in evaluation.

## 2019-12-01 NOTE — Progress Notes (Signed)
° °  Patient Name: Zachary Mata Date of Encounter: 12/01/2019, 2:19 PM    Subjective  Up w/ PT yesterday Wife says he ate good breakfast More alert   Objective  BP 102/65 (BP Location: Right Arm)    Pulse 89    Temp 98.7 F (37.1 C) (Oral)    Resp 15    Ht 5\' 6"  (1.676 m)    Wt 60.9 kg    SpO2 98%    BMI 21.67 kg/m  Deeply jaundiced though less Lungs cta ant NL cor abd - mod ascites Neuro no asterixis, alert and oriented x 3 -  flat affect   Wt Readings from Last 3 Encounters:  12/01/19 60.9 kg  04/09/19 62.6 kg  03/12/19 62.6 kg    Intake/Output Summary (Last 24 hours) at 12/01/2019 1422 Last data filed at 12/01/2019 12/03/2019 Gross per 24 hour  Intake 240 ml  Output 2600 ml  Net -2360 ml     Lab Results  Component Value Date   ALT 41 12/01/2019   AST 56 (H) 12/01/2019   ALKPHOS 219 (H) 12/01/2019   BILITOT 13.4 (H) 12/01/2019   Lab Results  Component Value Date   WBC 24.8 (H) 12/01/2019   HGB 7.4 (L) 12/01/2019   HCT 21.2 (L) 12/01/2019   MCV 119.1 (H) 12/01/2019   PLT 120 (L) 12/01/2019     Lab Results  Component Value Date   CREATININE 0.74 12/01/2019   BUN 9 12/01/2019   NA 137 12/01/2019   K 3.8 12/01/2019   CL 100 12/01/2019   CO2 27 12/01/2019    NH3 pending  LE doppeler neg DVT bilat  Assessment and Plan  Alcoholic hepatitis w/ acute liver failure + encephalopathy and  ascites ? Cirrhosis - improving on prednisolone and lactulose + Xifaxan  Continue current care - will see what discriminant fx and Lille score are tomorrow but would plan to continue tx   Other issues - aspiration PNA, anemia, anxiety and depression, malnutrition  12/03/2019, MD, [facility] 12/01/2019 2:19 PM

## 2019-12-01 NOTE — Progress Notes (Signed)
Lower extremity venous bilateral study completed.   Please see CV Proc for preliminary results.   Kyrell Ruacho, RDMS  

## 2019-12-01 NOTE — TOC Initial Note (Signed)
Transition of Care [facility]) - Initial/Assessment Note    Patient Details  Name: Zachary Mata MRN: [id number] Date of Birth: 01/29/1981  [facility] CM/SW Contact:    Pollie Friar, RN Phone Number: 12/01/2019, 3:33 PM  Clinical Narrative:                 CM met with the patient and his spouse. CM answered questions about FMLA and disability as best able.  Pt with recommendations for [facility] services. Spouse doesn't have a preference.  Pt will need walker at d/c. CM following for this DME.  CM provided spouse with resources for inpatient/ outpatient alcohol counseling. TOC following for further d/c needs.   Expected Discharge Plan: [place] Barriers to Discharge: Continued Medical Work up   Patient Goals and CMS Choice   CMS Medicare.gov Compare Post Acute Care list provided to:: Patient Represenative (must comment) Choice offered to / list presented to : Spouse  Expected Discharge Plan and Services Expected Discharge Plan: [place]   Discharge Planning Services: CM Consult Post Acute Care Choice: Home Health, Durable Medical Equipment Living arrangements for the past 2 months: Single Family Home                 DME Arranged: Walker rolling DME Agency: [employer]       HH Arranged: PT          Prior Living Arrangements/Services Living arrangements for the past 2 months: Single Family Home Lives with:: Spouse Patient language and need for interpreter reviewed:: Yes Do you feel safe going back to the place where you live?: Yes      Need for Family Participation in Patient Care: Yes (Comment) Care giver support system in place?: Yes (comment)   Criminal Activity/Legal Involvement Pertinent to Current Situation/Hospitalization: No - Comment as needed  Activities of Daily Living Home Assistive Devices/Equipment: None ADL Screening (condition at time of admission) Patient's cognitive ability adequate to safely complete  daily activities?: Yes Is the patient deaf or have difficulty hearing?: No Does the patient have difficulty seeing, even when wearing glasses/contacts?: No Does the patient have difficulty concentrating, remembering, or making decisions?: No Patient able to express need for assistance with ADLs?: Yes Does the patient have difficulty dressing or bathing?: No Independently performs ADLs?: Yes (appropriate for developmental age) Does the patient have difficulty walking or climbing stairs?: No Weakness of Legs: Both Weakness of Arms/Hands: Both  Permission Sought/Granted                  Emotional Assessment Appearance:: Appears stated age       Alcohol / Substance Use: Alcohol Use Psych Involvement: No (comment)  Admission diagnosis:  Alcohol withdrawal ([place]) [F10.239] Hypokalemia [E87.6] Hyperbilirubinemia [E80.6] Abdominal distension [R14.0] Back pain [M54.9] Hyperammonemia ([facility]) [E72.20] Liver failure, acute [K72.00] Jaundice [R17] Ascites [R18.8] Abdominal pain [R10.9] Patient Active Problem List   Diagnosis Date Noted  . Alcoholic hepatitis with ascites   . Alcohol withdrawal ([place]) 11/26/2019  . Liver failure, acute 11/26/2019  . Jaundice due to hepatitis 11/25/2019  . Hypokalemia 11/25/2019  . Alcoholic hepatitis without ascites 11/25/2019  . Alcohol cessation counseling 11/25/2019  . Infertility male 06/22/2019  . Allergic conjunctivitis and rhinitis, bilateral 04/09/2019  . Transaminitis 03/03/2019  . Normocytic anemia 03/03/2019  . Erectile dysfunction 02/26/2019  . Generalized anxiety disorder 02/26/2019  . COPD (chronic obstructive pulmonary disease) 02/26/2019  . Muscle spasm 02/26/2019  . Smoker  02/26/2019  . Excessive daytime sleepiness 02/26/2019  . Alcohol abuse   . Mental health problem   . Annual physical exam 06/19/2012  . Essential hypertension 03/02/2012  . Chronic back pain 03/02/2012   PCP:  Silverio Decamp, MD Pharmacy:    [id number] [place] [id number]: [phone]: [phone]    Social Determinants of Health (SDOH) Interventions    Readmission Risk Interventions No flowsheet data found.

## 2019-12-01 NOTE — Progress Notes (Signed)
PROGRESS NOTE    Zachary Mata  QQV:[id number] DOB: 1981-01-25 DOA: 11/25/2019 PCP: Monica Becton, MD    Chief Complaint  Patient presents with  . Abdominal Pain  . Chest Pain    Brief Narrative:   39 year old gentleman with prior history of hypertension, daily alcohol abuse presents to ED with jaundice, abdominal distention, back pain. He was admitted for acute decompensated liver failure secondary to EtOH abuse associated with hyperbilirubinemia, portal hypertension, splenomegaly, esophageal varices, ascites. GI consulted for further evaluation.  Diagnostic paracentesis with fluid studies have been ordered.  Patient was started empirically on IV Rocephin for possible SBP. Th cultures from the ascitic fluid negative so far.  Patient seen and examined at bedside patient appears more alert and answering questions appropriately. Physical therapy/psychiatry evaluation ordered.   Assessment & Plan:   Principal Problem:   Alcoholic hepatitis without ascites Active Problems:   Essential hypertension   Alcohol abuse   Jaundice due to hepatitis   Hypokalemia   Alcohol cessation counseling   Alcohol withdrawal (HCC)   Liver failure, acute   Decompensated liver cirrhosis with ascites Associated with EtOH hepatitis, CT of the abdomen pelvis shows liver cirrhosis, splenomegaly, esophageal varices, portal hypertension and ascites. GI on board and appreciate recommendations.  Ultrasound paracentesis on admission and ascites fluid negative for infection.  He was initially started on Rocephin later on discontinued. Patient was started on lactulose 30 g twice daily, rifaximin, Lasix 40 mg daily and spironolactone 100 mg daily by gastroenterology. Maddrey discriminant function is 44 on admission, steroids started.  Bilirubin improved to 13 . No changes in meds.  Gastroenterology recommends repeating discriminant fraction on 10/28.    Ascites:  S/p diagnostic paracentesis,  cultures negative.  He was started on Lasix 40 mg daily  and spironolactone 100 mg DAILY.  Continue the same   Small bowel and colon wall thickening Incidental finding on CT abdomen.  Pt currently is not nauseated or vomiting. Wife at bedside reported vomiting prior to admission.  Patient continues to be nauseated, as needed Zofran, will add Protonix   Essential Hypertension;   Well-controlled   Hypokalemia: replaced.    Alcohol abuse:  TOC consulted for resources.  On CIWA protocol for alcohol withdrawal symptoms.    Leg edema and tenderness Check venous duplex to rule out DVT.   Anemia or thrombocytopenia:  Probably secondary to liver cirrhosis.  Platelets dropped to 120 today, venous duplex is negative for DVT, recommend starting the patient on heparin for DVT prophylaxis.    Aspiration pneumonia Chest x-ray shows Layering small left pleural effusion with overlying left basilar opacities, concerning for aspiration and/or pneumonia.  We will change IV Rocephin to Unasyn for possible aspiration.  Started the patient on Unasyn, recommend at least 5 days of IV Unasyn. SLP evaluation ordered, recommending MBS.  Hypoalbuminemia:  From liver cirrhosis.  Albumin ranging between 1.2-1.4    Esophageal varices:  Will probably need an EGD in the future.    Hyponatremia:  Probably from hypervolemic hyponatremia.   resolved with Lasix.  Acute hepatic encephalopathy:  Elevated ammonia levels improved from 82-56 Continue with lactulose  Anxiety and depression:  Will  Get psych evaluation once pt is alert and oriented and able to maintain a conversation.    Recommend PT evaluation patient is more alert and is able to participate  DVT prophylaxis:scd's Code Status: (FULL CODE Family Communication: Family at bedside.  Disposition:   Status is: Inpatient  The patient will require care  spanning > 2 midnights and should be moved to inpatient because: Hemodynamically  unstable, Ongoing diagnostic testing needed not appropriate for outpatient work up, Unsafe d/c plan, IV treatments appropriate due to intensity of illness or inability to take PO and Inpatient level of care appropriate due to severity of illness  Dispo: The patient is from: Home              Anticipated d/c is to: pending              Anticipated d/c date is: > 3 days              Patient currently is not medically stable to d/c.       Consultants:   Gastroenterology    Procedures; Paracentesis about 820 ml fluid taken off.   Antimicrobials: rocephin sine admission   Subjective: Patient is more alert and awake and answering questions, reports being nauseated but no vomiting.  Objective: Vitals:   12/01/19 0300 12/01/19 0415 12/01/19 0500 12/01/19 0823  BP:  112/63  99/68  Pulse:  80  90  Resp: Temp:  98 F (36.7 C)  98.6 F (37 C)  TempSrc:  Oral  Oral  SpO2:  98%  96%  Weight:   60.9 kg   Height:        Intake/Output Summary (Last 24 hours) at 12/01/2019 1126 Last data filed at 12/01/2019 4098 Gross per 24 hour  Intake 240 ml  Output 2600 ml  Net -2360 ml   Filed Weights   11/28/19 1643 12/01/19 0500  Weight: 62.2 kg 60.9 kg    Examination:  General exam: Ill-appearing gentleman, jaundiced, appears comfortable not in any kind of distress Respiratory system: Bilateral air entry fair, no wheezing or rhonchi, no tachypnea Cardiovascular system: S1-S2 heard, regular rate rhythm, no JVD Gastrointestinal system: abdomen is soft, distended, bowel sounds normal, no tenderness.  Central nervous system: Patient is more alert and able to answer questions, was able to walk with PT yesterday Extremities: Improving leg edema Skin: spider naevi present.  Psychiatry: Mood is appropriate    Data Reviewed: I have personally reviewed following labs and imaging studies  CBC: Recent Labs  Lab 11/26/19 0212 11/26/19 0212 11/27/19 0226 11/28/19 0257  11/29/19 0404 11/30/19 0441 12/01/19 0414  WBC 19.6*   < > 23.0* 25.6* 19.0* 25.7* 24.8*  NEUTROABS 15.8*  --   --   --   --   --   --   HGB 7.4*   < > 7.4* 7.5* 7.6* 7.5* 7.4*  HCT 21.6*   < > 21.5* 21.4* 22.4* 21.8* 21.2*  MCV 116.8*   < > 116.2* 115.7* 117.3* 119.8* 119.1*  PLT 101*   < > 103* 125* 124* 121* 120*   < > = values in this interval not displayed.    Basic Metabolic Panel: Recent Labs  Lab 11/25/19 0050 11/25/19 0417 11/25/19 0843 11/26/19 0212 11/27/19 0226 11/28/19 0257 11/29/19 0404 11/30/19 0441 11/30/19 1755 12/01/19 0414  NA   < >  --  130*   < > 131* 133* 136 139  --  137  K   < >  --  4.0   < > 3.5 3.7 3.5 3.7  --  3.8  CL   < >  --  93*   < > 96* 97* 98 102  --  100  CO2   < >  --  25   < > 26  28 28 29   --  27  GLUCOSE   < >  --  97   < > 189* 121* 117* 116*  --  107*  BUN   < >  --  <5*   < > 6 8 8 10   --  9  CREATININE   < >  --  0.63   < > 0.72 0.69 0.71 0.70  --  0.74  CALCIUM   < >  --  7.3*   < > 7.8* 8.0* 7.8* 7.8*  --  7.8*  MG  --  1.7  --   --   --   --   --   --   --   --   PHOS  --   --  4.4  --   --   --   --   --  5.5*  --    < > = values in this interval not displayed.    GFR: Estimated Creatinine Clearance: 107.8 mL/min (by C-G formula based on SCr of 0.74 mg/dL).  Liver Function Tests: Recent Labs  Lab 11/27/19 0226 11/28/19 0257 11/29/19 0404 11/30/19 0441 12/01/19 0414  AST 93* 70* 71* 61* 56*  ALT 26 26 32 38 41  ALKPHOS 227* 247* 235* 239* 219*  BILITOT 20.7* 20.6* 19.2* 16.3* 13.4*  PROT 7.1 7.3 7.5 7.1 6.9  ALBUMIN 1.2* 1.3* 1.4* 1.3* 1.3*    CBG: No results for input(s): GLUCAP in the last 168 hours.   Recent Results (from the past 240 hour(s))  Respiratory Panel by RT PCR (Flu A&B, Covid) - Nasopharyngeal Swab     Status: None   Collection Time: 11/25/19  4:24 AM   Specimen: Nasopharyngeal Swab  Result Value Ref Range Status   SARS Coronavirus 2 by RT PCR NEGATIVE NEGATIVE Final    Comment:  (NOTE) SARS-CoV-2 target nucleic acids are NOT DETECTED.  The SARS-CoV-2 RNA is generally detectable in upper respiratoy specimens during the acute phase of infection. The lowest concentration of SARS-CoV-2 viral copies this assay can detect is 131 copies/mL. A negative result does not preclude SARS-Cov-2 infection and should not be used as the sole basis for treatment or other patient management decisions. A negative result may occur with  improper specimen collection/handling, submission of specimen other than nasopharyngeal swab, presence of viral mutation(s) within the areas targeted by this assay, and inadequate number of viral copies (<131 copies/mL). A negative result must be combined with clinical observations, patient history, and epidemiological information. The expected result is Negative.  Fact Sheet for Patients:  https://www.moore.com/https://www.fda.gov/media/142436/download  Fact Sheet for Healthcare Providers:  https://www.young.biz/https://www.fda.gov/media/142435/download  This test is no t yet approved or cleared by the [place] FDA and  has been authorized for detection and/or diagnosis of SARS-CoV-2 by FDA under an Emergency Use Authorization (EUA). This EUA will remain  in effect (meaning this test can be used) for the duration of the COVID-19 declaration under Section 564(b)(1) of the Act, 21 U.S.C. section 360bbb-3(b)(1), unless the authorization is terminated or revoked sooner.     Influenza A by PCR NEGATIVE NEGATIVE Final   Influenza B by PCR NEGATIVE NEGATIVE Final    Comment: (NOTE) The Xpert Xpress SARS-CoV-2/FLU/RSV assay is intended as an aid in  the diagnosis of influenza from Nasopharyngeal swab specimens and  should not be used as a sole basis for treatment. Nasal washings and  aspirates are unacceptable for Xpert Xpress SARS-CoV-2/FLU/RSV  testing.  Fact Sheet for Patients: https://www.moore.com/https://www.fda.gov/media/142436/download  Fact Sheet for Healthcare  Providers: https://www.young.biz/  This test is not yet approved or cleared by the [place] FDA and  has been authorized for detection and/or diagnosis of SARS-CoV-2 by  FDA under an Emergency Use Authorization (EUA). This EUA will remain  in effect (meaning this test can be used) for the duration of the  Covid-19 declaration under Section 564(b)(1) of the Act, 21  U.S.C. section 360bbb-3(b)(1), unless the authorization is  terminated or revoked. Performed at [facility], [address], [place], [place] [address]   Gram stain     Status: None   Collection Time: 11/25/19  4:17 PM   Specimen: Abdomen; Peritoneal Fluid  Result Value Ref Range Status   Specimen Description FLUID PERITONEAL ABDOMEN  Final   Special Requests NONE  Final   Gram Stain   Final    WBC PRESENT, PREDOMINANTLY MONONUCLEAR RED BLOOD CELLS PRESENT NO ORGANISMS SEEN CYTOSPIN SMEAR Performed at [facility], [address] [place], [place] [address]    Report Status 11/25/2019 FINAL  Final  Culture, body fluid-bottle     Status: None   Collection Time: 11/25/19  4:17 PM   Specimen: Fluid  Result Value Ref Range Status   Specimen Description FLUID PERITONEAL ABDOMEN  Final   Special Requests BOTTLES DRAWN AEROBIC AND ANAEROBIC  Final   Culture   Final    NO GROWTH 5 DAYS Performed at [facility], [address], [place], [place] [address]    Report Status 11/30/2019 FINAL  Final         Radiology Studies: DG Swallowing Func-Speech Pathology  Result Date: 11/30/2019 Objective Swallowing Evaluation: Type of Study: MBS-Modified Barium Swallow Study  Patient Details Name: Zachary Mata MRN: [id number] Date of Birth: 12-08-1980 Today's Date: 11/30/2019 Time: SLP Start Time (ACUTE ONLY): 1410 -SLP Stop Time (ACUTE ONLY): 1435 SLP Time Calculation (min) (ACUTE ONLY): 25 min Past Medical History: Past Medical History: Diagnosis Date . Chronic back pain 03/02/2012 .  Essential hypertension 03/02/2012 . Hypertension  Past Surgical History: Past Surgical History: Procedure Laterality Date . HAND SURGERY   . IR PARACENTESIS  11/25/2019 . WRIST SURGERY   HPI: 39 year old gentleman with prior history of hypertension, daily alcohol abuse presents to ED with jaundice, abdominal distention, back pain. He was admitted for acute decompensated liver failure secondary to EtOH abuseassociated with hyperbilirubinemia, portal hypertension, splenomegaly, esophageal varices, ascites.  CXR on 10/23 "concerning for aspiration and/or pneumonia".   Subjective: alert upright in chair for procedure Assessment / Plan / Recommendation CHL IP CLINICAL IMPRESSIONS 11/30/2019 Clinical Impression Pt presents with a mild oropharyngeal dysphagia. Oral deficits c/b delayed bolus propulsion and mild oral residuals. Pharyngeal deficits c/b reduced base of tongue retraction, decreased laryngeal elevation, and reduced timing and efficiency of laryngeal vestibule closure. This allowed for isolated instance of pre and during the swallow laryngeal penetration (PAS-3) with thin liquids with straw use only. No further penetration or aspiration noted with any POs. Mild vallecular and pyriform sinus residuals noted with POs (greatest with thicker POs) thin liquid alternation or second swallows assisted to decrease residual amount. Pt and spouse educated on diet recommendations and safe swallowing strategies to maximize swallow safety and efficiency.  SLP Visit Diagnosis Dysphagia, oropharyngeal phase (R13.12) Attention and concentration deficit following -- Frontal lobe and executive function deficit following -- Impact on safety and function Mild aspiration risk   CHL IP TREATMENT RECOMMENDATION 11/30/2019 Treatment Recommendations Therapy as outlined in treatment  plan below   Prognosis 11/30/2019 Prognosis for Safe Diet Advancement Good Barriers to Reach Goals -- Barriers/Prognosis Comment -- CHL IP DIET RECOMMENDATION  11/30/2019 SLP Diet Recommendations Regular solids;Thin liquid Liquid Administration via Cup;No straw Medication Administration Whole meds with puree Compensations Minimize environmental distractions;Slow rate;Small sips/bites;Follow solids with liquid Postural Changes Remain semi-upright after after feeds/meals (Comment)   CHL IP OTHER RECOMMENDATIONS 11/30/2019 Recommended Consults -- Oral Care Recommendations Oral care BID Other Recommendations --   CHL IP FOLLOW UP RECOMMENDATIONS 11/30/2019 Follow up Recommendations 24 hour supervision/assistance   CHL IP FREQUENCY AND DURATION 11/30/2019 Speech Therapy Frequency (ACUTE ONLY) min 1 x/week Treatment Duration 1 week      CHL IP ORAL PHASE 11/30/2019 Oral Phase Impaired Oral - Pudding Teaspoon -- Oral - Pudding Cup -- Oral - Honey Teaspoon -- Oral - Honey Cup -- Oral - Nectar Teaspoon -- Oral - Nectar Cup Weak lingual manipulation;Reduced posterior propulsion;Delayed oral transit;Lingual/palatal residue Oral - Nectar Straw -- Oral - Thin Teaspoon -- Oral - Thin Cup Delayed oral transit;Lingual/palatal residue Oral - Thin Straw Delayed oral transit;Lingual/palatal residue Oral - Puree Delayed oral transit;Reduced posterior propulsion;Lingual/palatal residue Oral - Mech Soft Delayed oral transit;Lingual/palatal residue;Piecemeal swallowing;Reduced posterior propulsion Oral - Regular -- Oral - Multi-Consistency -- Oral - Pill Delayed oral transit;Lingual/palatal residue Oral Phase - Comment --  CHL IP PHARYNGEAL PHASE 11/30/2019 Pharyngeal Phase Impaired Pharyngeal- Pudding Teaspoon -- Pharyngeal -- Pharyngeal- Pudding Cup -- Pharyngeal -- Pharyngeal- Honey Teaspoon -- Pharyngeal -- Pharyngeal- Honey Cup -- Pharyngeal -- Pharyngeal- Nectar Teaspoon -- Pharyngeal -- Pharyngeal- Nectar Cup Reduced tongue base retraction;Pharyngeal residue - valleculae Pharyngeal -- Pharyngeal- Nectar Straw -- Pharyngeal -- Pharyngeal- Thin Teaspoon -- Pharyngeal -- Pharyngeal- Thin Cup  Penetration/Aspiration during swallow;Reduced tongue base retraction;Reduced laryngeal elevation;Pharyngeal residue - valleculae;Pharyngeal residue - pyriform Pharyngeal Material does not enter airway Pharyngeal- Thin Straw Penetration/Aspiration before swallow;Penetration/Aspiration during swallow;Pharyngeal residue - pyriform;Pharyngeal residue - valleculae;Reduced epiglottic inversion;Reduced tongue base retraction;Reduced laryngeal elevation Pharyngeal Material enters airway, remains ABOVE vocal cords and not ejected out Pharyngeal- Puree Delayed swallow initiation-vallecula;Reduced tongue base retraction;Reduced laryngeal elevation;Pharyngeal residue - valleculae;Pharyngeal residue - pyriform Pharyngeal -- Pharyngeal- Mechanical Soft Pharyngeal residue - valleculae;Pharyngeal residue - pyriform;Reduced laryngeal elevation;Reduced tongue base retraction Pharyngeal -- Pharyngeal- Regular -- Pharyngeal -- Pharyngeal- Multi-consistency -- Pharyngeal -- Pharyngeal- Pill Pharyngeal residue - valleculae;Pharyngeal residue - pyriform;Reduced laryngeal elevation;Reduced tongue base retraction Pharyngeal -- Pharyngeal Comment --  CHL IP CERVICAL ESOPHAGEAL PHASE 11/30/2019 Cervical Esophageal Phase WFL Pudding Teaspoon -- Pudding Cup -- Honey Teaspoon -- Honey Cup -- Nectar Teaspoon -- Nectar Cup -- Nectar Straw -- Thin Teaspoon -- Thin Cup -- Thin Straw -- Puree -- Mechanical Soft -- Regular -- Multi-consistency -- Pill -- Cervical Esophageal Comment -- Chelsea E Hartness MA, CCC-SLP 11/30/2019, 3:05 PM                   Scheduled Meds: . furosemide  40 mg Oral Daily  . lactulose  30 g Oral BID  . nicotine  21 mg Transdermal Daily  . phytonadione  10 mg Oral Daily  . prednisoLONE  40 mg Oral Daily  . rifaximin  550 mg Oral BID  . spironolactone  100 mg Oral Daily  . thiamine  100 mg Oral Daily   Continuous Infusions: . sodium chloride 1,000 mL (11/28/19 1826)  . ampicillin-sulbactam (UNASYN) IV 3 g  (12/01/19 0552)     LOS: 5 days     Kathlen Mody, MD [facility]   To  contact the attending provider between 7A-7P or the covering provider during after hours 7P-7A, please log into the web site [email or web address] and access using universal [place] password for that web site. If you do not have the password, please call the hospital operator.  12/01/2019, 11:26 AM

## 2019-12-02 DIAGNOSIS — K7031 Alcoholic cirrhosis of liver with ascites: Secondary | ICD-10-CM | POA: Diagnosis not present

## 2019-12-02 DIAGNOSIS — K7010 Alcoholic hepatitis without ascites: Secondary | ICD-10-CM | POA: Diagnosis not present

## 2019-12-02 DIAGNOSIS — K7290 Hepatic failure, unspecified without coma: Secondary | ICD-10-CM | POA: Diagnosis not present

## 2019-12-02 DIAGNOSIS — K759 Inflammatory liver disease, unspecified: Secondary | ICD-10-CM | POA: Diagnosis not present

## 2019-12-02 LAB — FOLATE RBC
Folate, Hemolysate: 255 ng/mL
Folate, RBC: 1328 ng/mL (ref >498)
Hematocrit: 19.2 % — ABNORMAL LOW (ref 37.5–51.0)

## 2019-12-02 LAB — CBC
HCT: 20.5 % — ABNORMAL LOW (ref 39.0–52.0)
Hemoglobin: 7 g/dL — ABNORMAL LOW (ref 13.0–17.0)
MCH: 41.2 pg — ABNORMAL HIGH (ref 26.0–34.0)
MCHC: 34.1 g/dL (ref 30.0–36.0)
MCV: 120.6 fL — ABNORMAL HIGH (ref 80.0–100.0)
Platelets: 138 10*3/uL — ABNORMAL LOW (ref 150–400)
RBC: 1.7 MIL/uL — ABNORMAL LOW (ref 4.22–5.81)
RDW: 14 % (ref 11.5–15.5)
WBC: 28.1 10*3/uL — ABNORMAL HIGH (ref 4.0–10.5)
nRBC: 0 % (ref 0.0–0.2)

## 2019-12-02 LAB — COMPREHENSIVE METABOLIC PANEL
ALT: 45 U/L — ABNORMAL HIGH (ref 0–44)
AST: 60 U/L — ABNORMAL HIGH (ref 15–41)
Albumin: 1.4 g/dL — ABNORMAL LOW (ref 3.5–5.0)
Alkaline Phosphatase: 205 U/L — ABNORMAL HIGH (ref 38–126)
Anion gap: 9 (ref 5–15)
BUN: 10 mg/dL (ref 6–20)
CO2: 28 mmol/L (ref 22–32)
Calcium: 7.7 mg/dL — ABNORMAL LOW (ref 8.9–10.3)
Chloride: 99 mmol/L (ref 98–111)
Creatinine, Ser: 0.77 mg/dL (ref 0.61–1.24)
GFR, Estimated: >60 mL/min (ref >60)
Glucose, Bld: 125 mg/dL — ABNORMAL HIGH (ref 70–99)
Potassium: 4.1 mmol/L (ref 3.5–5.1)
Sodium: 136 mmol/L (ref 135–145)
Total Bilirubin: 13.8 mg/dL — ABNORMAL HIGH (ref 0.3–1.2)
Total Protein: 6.9 g/dL (ref 6.5–8.1)

## 2019-12-02 LAB — ABO/RH: ABO/RH(D): O POS

## 2019-12-02 LAB — AMMONIA: Ammonia: 71 umol/L — ABNORMAL HIGH (ref 9–35)

## 2019-12-02 LAB — PROTIME-INR
INR: 2 — ABNORMAL HIGH (ref 0.8–1.2)
Prothrombin Time: 22 seconds — ABNORMAL HIGH (ref 11.4–15.2)

## 2019-12-02 LAB — VITAMIN B12: Vitamin B-12: 2392 pg/mL — ABNORMAL HIGH (ref 180–914)

## 2019-12-02 LAB — PREPARE RBC (CROSSMATCH)

## 2019-12-02 MED ORDER — SODIUM CHLORIDE 0.9% IV SOLUTION: Freq: Once | INTRAVENOUS | Status: AC

## 2019-12-02 NOTE — Progress Notes (Signed)
  Speech Language Pathology Treatment: Dysphagia  Patient Details Name: Zachary Mata MRN: [id number] DOB: [birth year] Today's Date: 12/02/2019 Time: 7673-4193 SLP Time Calculation (min) (ACUTE ONLY): 12 min  Assessment / Plan / Recommendation Clinical Impression  Followed up for diet tolerance. Pt and spouse report good tolerance of current diet. Pt with improvements in mentation, alert; able to feed self. No overt s/sx of aspiration with thin liquids or soft mixed consistency texture snack. Reviewed recommendations and rationale for safe swallowing strategies from recent MBSS: alternation of liquids and solids for improved pharyngeal clearance and use of effortful swallow for improved swallow efficiency. Pt able to implement with cues from SLP and spouse. No further needs identified.     HPI HPI: 39 year old gentleman with prior history of hypertension, daily alcohol abuse presents to ED with jaundice, abdominal distention, back pain. He was admitted for acute decompensated liver failure secondary to EtOH abuseassociated with hyperbilirubinemia, portal hypertension, splenomegaly, esophageal varices, ascites.  CXR on 10/23 "concerning for aspiration and/or pneumonia".        SLP Plan  All goals met       Recommendations  Diet recommendations: Regular;Thin liquid Liquids provided via: Cup;Straw Medication Administration: Whole meds with puree Supervision: Patient able to self feed;Intermittent supervision to cue for compensatory strategies Compensations: Minimize environmental distractions;Slow rate;Small sips/bites;Follow solids with liquid;Effortful swallow Postural Changes and/or Swallow Maneuvers: Upright 30-60 min after meal;Seated upright 90 degrees;Out of bed for meals                Oral Care Recommendations: Oral care BID Follow up Recommendations: 24 hour supervision/assistance SLP Visit Diagnosis: Dysphagia, oropharyngeal phase (R13.12) Plan: All goals met        GO                Markesia Crilly E Jamori Biggar MA, CCC-SLP Acute Rehabilitation Services  12/02/2019, 10:31 AM

## 2019-12-02 NOTE — Plan of Care (Signed)

## 2019-12-02 NOTE — Progress Notes (Signed)
Physical Therapy Treatment Patient Details Name: Zachary Mata MRN: [id number] DOB: [birth year] Today's Date: 12/02/2019    History of Present Illness Zachary Mata is a 39 y.o. male with medical history significant for hypertension, ED, daily alcohol use presents with chief concern of yellowing skin, abdominal swelling x 4 weeks.  He presents today because he was not feeling well. He reports he drinks etoh daily and does not count the number of drinks he has. He drinks throughout the day and his last drink was approximately 11 pm.    PT Comments    Pt remains limited by fatigue, although does ambulate for increased distances this session. Pt remains weak and requires PT assistance for all OOB mobility at this time due to high falls risk. Pt demonstrates reduced LE strength resulting in foot drag with fatigue. Pt will benefit from continued acute PT POC to improve mobility quality and activity tolerance. PT increases frequency to 3x/wk due to improved participation. PT continues to recommend HHPT, a RW, and assistance from family at the time of discharge.  Follow Up Recommendations  Home health PT;Supervision/Assistance - 24 hour     Equipment Recommendations  Rolling walker with 5" wheels    Recommendations for Other Services       Precautions / Restrictions Precautions Precautions: Fall Restrictions Weight Bearing Restrictions: No    Mobility  Bed Mobility Overal bed mobility: Needs Assistance Bed Mobility: Supine to Sit;Sit to Supine     Supine to sit: Supervision Sit to supine: Supervision   General bed mobility comments: increased time, HOB elevated  Transfers Overall transfer level: Needs assistance Equipment used: None Transfers: Sit to/from Stand Sit to Stand: Min guard         General transfer comment: PT assistance for safety, tremulous LE and UE during sit to stand from edge of bed  Ambulation/Gait Ambulation/Gait assistance: Min assist Gait Distance  (Feet): 45 Feet Assistive device: IV Pole Gait Pattern/deviations: Step-to pattern;Decreased dorsiflexion - left Gait velocity: reduced Gait velocity interpretation: <1.31 ft/sec, indicative of household ambulator General Gait Details: pt with short step to gait, reduced foot clearance bilaterally with fatigue but moreso with LLE. Pt initially with unilateral UE support of IV pole progressing to BUE support of IV pole   Stairs             Wheelchair Mobility    Modified Rankin (Stroke Patients Only)       Balance Overall balance assessment: Needs assistance Sitting-balance support: Single extremity supported;Feet supported;No upper extremity supported Sitting balance-Leahy Scale: Fair     Standing balance support: Single extremity supported;Bilateral upper extremity supported Standing balance-Leahy Scale: Poor Standing balance comment: reliant on UE support of IV pole                            Cognition Arousal/Alertness: Awake/alert Behavior During Therapy: Flat affect Overall Cognitive Status: Impaired/Different from baseline Area of Impairment: Safety/judgement;Awareness;Problem solving                         Safety/Judgement: Decreased awareness of deficits Awareness: Emergent Problem Solving: Slow processing;Requires verbal cues;Requires tactile cues        Exercises      General Comments General comments (skin integrity, edema, etc.): VSS on RA      Pertinent Vitals/Pain Pain Assessment: Faces Faces Pain Scale: Hurts little more Pain Location: back Pain Descriptors / Indicators: Aching Pain Intervention(s): Monitored  during session    Home Living                      Prior Function            PT Goals (current goals can now be found in the care plan section) Acute Rehab PT Goals Patient Stated Goal: to return home Progress towards PT goals: Progressing toward goals    Frequency    Min 3X/week       PT Plan Frequency needs to be updated    Co-evaluation              AM-PAC PT "6 Clicks" Mobility   Outcome Measure  Help needed turning from your back to your side while in a flat bed without using bedrails?: None Help needed moving from lying on your back to sitting on the side of a flat bed without using bedrails?: None Help needed moving to and from a bed to a chair (including a wheelchair)?: A Little Help needed standing up from a chair using your arms (e.g., wheelchair or bedside chair)?: A Little Help needed to walk in hospital room?: A Little Help needed climbing 3-5 steps with a railing? : A Lot 6 Click Score: 19    End of Session   Activity Tolerance: Patient limited by fatigue Patient left: in bed;with call bell/phone within reach;with bed alarm set;with family/visitor present Nurse Communication: Mobility status PT Visit Diagnosis: Unsteadiness on feet (R26.81);Muscle weakness (generalized) (M62.81);Difficulty in walking, not elsewhere classified (R26.2);History of falling (Z91.81)     Time: 0867-6195 PT Time Calculation (min) (ACUTE ONLY): 18 min  Charges:  $Gait Training: 8-22 mins                     Arlyss Gandy, PT, DPT Acute Rehabilitation Pager: [phone]    Arlyss Gandy 12/02/2019, 2:24 PM

## 2019-12-02 NOTE — Progress Notes (Signed)
[facility] Progress Note  Chief Complaint: Acute alcoholic hepatitis  History:  Zachary Mata has made slow improvement since I saw him several days ago.  He has been able to get up and work with PT, but feeling generally fatigued.  He has back pain from the uncomfortable bed and still requiring morphine and as needed Ativan.  Despite that, his mental status has slowly cleared in the last couple of days.  He is eating fairly well, denies nausea or vomiting.  Good urine output.  Having 4-5 BMs per day on lactulose.  Abdominal bloating, no pain, not affecting his breathing.  Says he is coughing and bringing up some clear sputum.  Objective:   Current Facility-Administered Medications:  .  0.9 %  sodium chloride infusion, , Intravenous, PRN, Kathlen Mody, MD, Last Rate: 10 mL/hr at 11/28/19 1826, 1,000 mL at 11/28/19 1826 .  Ampicillin-Sulbactam (UNASYN) 3 g in sodium chloride 0.9 % 100 mL IVPB, 3 g, Intravenous, Q6H, Akula, Vijaya, MD, Last Rate: 200 mL/hr at 12/02/19 0509, 3 g at 12/02/19 0509 .  furosemide (LASIX) tablet 40 mg, 40 mg, Oral, Daily, Danis, Starr Lake III, MD, 40 mg at 12/01/19 1046 .  lactulose (CHRONULAC) 10 GM/15ML solution 30 g, 30 g, Oral, BID, Dianah Field, PA-C, 30 g at 12/01/19 2150 .  morphine 2 MG/ML injection 0.5 mg, 0.5 mg, Intravenous, Q4H PRN, Kathlen Mody, MD, 0.5 mg at 12/02/19 0445 .  nicotine (NICODERM CQ - dosed in mg/24 hours) patch 21 mg, 21 mg, Transdermal, Daily, Kathlen Mody, MD, 21 mg at 12/01/19 1107 .  pantoprazole (PROTONIX) EC tablet 40 mg, 40 mg, Oral, Daily, Kathlen Mody, MD, 40 mg at 12/01/19 1243 .  phytonadione (VITAMIN K) tablet 10 mg, 10 mg, Oral, Daily, Dianah Field, PA-C, 10 mg at 12/01/19 1047 .  prednisoLONE tablet 40 mg, 40 mg, Oral, Daily, Meredith Pel, NP, 40 mg at 12/01/19 1046 .  [employer], , Oral, PRN, Kathlen Mody, MD .  rifaximin [employer]) tablet 550 mg, 550 mg, Oral, BID, Danis, Starr Lake III, MD, 550 mg at  12/01/19 2150 .  spironolactone (ALDACTONE) tablet 100 mg, 100 mg, Oral, Daily, Danis, Starr Lake III, MD, 100 mg at 12/01/19 1046 .  thiamine tablet 100 mg, 100 mg, Oral, Daily, 100 mg at 12/01/19 1046 **OR** [DISCONTINUED] thiamine (B-1) injection 100 mg, 100 mg, Intravenous, Daily, Cox, Amy N, DO, 100 mg at 11/25/19 2585 .  traMADol (ULTRAM) tablet 100 mg, 100 mg, Oral, Q6H PRN, Kathlen Mody, MD, 100 mg at 12/01/19 2026  . sodium chloride 1,000 mL (11/28/19 1826)  . ampicillin-sulbactam (UNASYN) IV 3 g (12/02/19 0509)     Vital signs in last 24 hrs: Vitals:   12/02/19 0357 12/02/19 0720  BP: 111/79 116/80  Pulse: 82 85  Resp: 16 12  Temp: 98.7 F (37.1 C) 97.7 F (36.5 C)  SpO2: 98% 99%    Intake/Output Summary (Last 24 hours) at 12/02/2019 0930 Last data filed at 12/02/2019 0400 Gross per 24 hour  Intake 720 ml  Output 2625 ml  Net -1905 ml     Physical Exam   HEENT: sclera icteric, oral mucosa without lesions  Neck: supple, no thyromegaly, JVD or lymphadenopathy  Cardiac: RRR without murmurs, S1S2 heard, no peripheral edema  Pulm: clear to auscultation bilaterally, low inspiratory effort, normal RR and effort noted  Abdomen: soft, no tenderness, with active bowel sounds.  Mildly distended and tympanic, soft no focal tenderness  Skin;  warm and dry, deeply jaundiced  Neuro: Mentation sluggish, but awake and conversational and oriented.  He thinks he remembers seeing me earlier in the hospitalization.  Tremulous, no asterixis.  Wife present at bedside for entire visit.  Recent Labs:  CBC Latest Ref Rng & Units 12/02/2019 12/01/2019 11/30/2019  WBC 4.0 - 10.5 K/uL 28.1(H) 24.8(H) 25.7(H)  Hemoglobin 13.0 - 17.0 g/dL 7.0(L) 7.4(L) 7.5(L)  Hematocrit 39 - 52 % 20.5(L) 21.2(L) 21.8(L)  Platelets 150 - 400 K/uL 138(L) 120(L) 121(L)    Recent Labs  Lab 12/02/19 0352  INR 2.0*   CMP Latest Ref Rng & Units 12/02/2019 12/01/2019 11/30/2019  Glucose 70 - 99 mg/dL  035(D) 974(B) 638(G)  BUN 6 - 20 mg/dL 10 9 10   Creatinine 0.61 - 1.24 mg/dL 5.36 4.68  Sodium 135 - 145 mmol/L 136 137 139  Potassium 3.5 - 5.1 mmol/L 4.1 3.8 3.7  Chloride 98 - 111 mmol/L 99 100 102  CO2 22 - 32 mmol/L 28 27 29   Calcium 8.9 - 10.3 mg/dL 7.7(L) 7.8(L) 7.8(L)  Total Protein 6.5 - 8.1 g/dL 6.9 6.9 7.1  Total Bilirubin 0.3 - 1.2 mg/dL 13.8(H) 13.4(H) 16.3(H)  Alkaline Phos 38 - 126 U/L 205(H) 219(H) 239(H)  AST 15 - 41 U/L 60(H) 56(H) 61(H)  ALT 0 - 44 U/L 45(H) 41 38   Mildly elevated F actin antibody of 22, significance unknown.   Assessment & Plan  Assessment: Decompensated alcohol-related cirrhosis Severe acute alcoholic hepatitis, stable on prednisolone. Ascites, no SBP on admission Clinically mild pneumonia on imaging Hepatic encephalopathy, slowly improving on lactulose and rifaximin Generalized deconditioning Alcohol abuse until time of admission. 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  Plan: Bilirubin fluctuating, INR about the same as admission.  Renal function remains normal. Therefore, not yet clear if we will continue prednisolone after discharge, biochemical improvement from that has been modest.  Continue current dose of lactulose, but most likely decrease dose by half the time of discharge if his mental status continues to be stable.  Continue rifaximin.  Continue current dose of diuretics, not planning to increase since he does not have tense ascites and I would like to keep renal function stable.  Severe anemia, we will message hospitalist and asked him to consider transfusion 1 unit PRBCs.  Hopefully getting closer to discharge if his functional status will allow.  When that occurs, we will arrange outpatient follow-up with our clinic.  After his long recovery from this acute illness and we see where his LFTs settle out, will most likely repeat the actin antibody to  see if the elevation persists, which might be suggestive of autoimmune hepatitis.  I suspect this is still most likely to be alcohol-related hepatitis given the modest transaminitis he has.   35 minutes were spent on this encounter (including chart review, history/exam, counseling/coordination of care, and documentation)   0.32 III Office: [phone]

## 2019-12-02 NOTE — Progress Notes (Signed)
PROGRESS NOTE    Zachary Mata  LOV:[id number] DOB: Dec 26, 1980 DOA: 11/25/2019 PCP: Monica Becton, MD    Chief Complaint  Patient presents with  . Abdominal Pain  . Chest Pain    Brief Narrative:   39 year old gentleman with prior history of hypertension, daily alcohol abuse presented to ED with jaundice, abdominal distention, back pain. He was admitted for acute decompensated liver failure secondary to EtOH abuse associated with hyperbilirubinemia, portal hypertension, splenomegaly, esophageal varices, ascites. GI consulted for further evaluation.  Diagnostic paracentesis done. Patient was started empirically on IV Rocephin for possible SBP. Th cultures from the ascitic fluid negative so far.    Assessment & Plan:   Principal Problem:   Alcoholic hepatitis without ascites Active Problems:   Essential hypertension   Alcohol abuse   Jaundice due to hepatitis   Hypokalemia   Alcohol cessation counseling   Alcohol withdrawal (HCC)   Liver failure, acute   Alcoholic hepatitis with ascites   Decompensated liver cirrhosis with ascites/ Alcoholic hepatitis: Associated with EtOH hepatitis, CT of the abdomen pelvis shows liver cirrhosis, splenomegaly, esophageal varices, portal hypertension and ascites. GI on board and appreciate recommendations.  Ultrasound paracentesis performed on 11/26/2019 and ascitic fluid negative for infection.  He was initially started on Rocephin later on discontinued and apparently switched to Unasyn for reasons unclear to me.  Acetic fluid analysis does not suggest any infection.  We will discontinue Unasyn today. Patient was started on lactulose 30 g twice daily, rifaximin, Lasix 40 mg daily and spironolactone 100 mg daily by gastroenterology. Maddrey discriminant function is 44 on admission, steroids started.   Small bowel and colon wall thickening Incidental finding on CT abdomen.  Patient has no symptoms currently.  Essential  Hypertension: Blood pressure soft.  Lisinopril on hold.  Hypokalemia: Resolved.  Alcohol abuse:  TOC consulted for resources.  On CIWA protocol for alcohol withdrawal symptoms.  No withdrawal symptoms at this point in time.  Leg edema and tenderness: DVT ruled out.  Anemia or thrombocytopenia: Secondary to chronic alcoholism and liver cirrhosis.  Seems to have some component of acute blood loss anemia secondary to esophageal varices.  Platelets are stable but hemoglobin dropped to 7.0.  Transfuse 1 unit of PRBC.  Recheck labs in the morning.  Checking FOBT.  GI already on board.  Aspiration pneumonia Chest x-ray shows Layering small left pleural effusion with overlying left basilar opacities, concerning for aspiration and/or pneumonia.  Antibiotics switched from Rocephin to Unasyn.  Will be completing 5 days today.  Will discontinue after this.  Esophageal varices:  Will probably need an EGD in the future.   Hyponatremia: Resolved.  Acute hepatic encephalopathy: Resolved.  He is alert and oriented.  Anxiety and depression: Psychiatry consulted.  They were unable to see him since he was lethargic.  Waiting for them to revisit.  DVT prophylaxis:scd's Code Status: (FULL CODE Family Communication: Wife at bedside. Disposition:   Status is: Inpatient  The patient will require care spanning > 2 midnights and should be moved to inpatient because: Hemodynamically unstable, Ongoing diagnostic testing needed not appropriate for outpatient work up, Unsafe d/c plan, IV treatments appropriate due to intensity of illness or inability to take PO and Inpatient level of care appropriate due to severity of illness  Dispo: The patient is from: Home              Anticipated d/c is to: pending  Anticipated d/c date is: 1-2 days              Patient currently is not medically stable to d/c.       Consultants:   Gastroenterology    Procedures; Paracentesis about 820 ml fluid  taken off.   Antimicrobials: rocephin sine admission   Subjective: Seen and examined.  Wife at the bedside.  He is alert and oriented and had no complaint.  Objective: Vitals:   12/02/19 0357 12/02/19 0426 12/02/19 0720 12/02/19 1112  BP: 111/79  116/80 101/61  Pulse: 82  85 82  Resp: Temp: 98.7 F (37.1 C)  97.7 F (36.5 C) 98.5 F (36.9 C)  TempSrc: Oral  Oral Oral  SpO2: 98%  99% 98%  Weight:  58 kg    Height:        Intake/Output Summary (Last 24 hours) at 12/02/2019 1303 Last data filed at 12/02/2019 0400 Gross per 24 hour  Intake 720 ml  Output 2625 ml  Net -1905 ml   Filed Weights   11/28/19 1643 12/01/19 0500 12/02/19 0426  Weight: 62.2 kg 60.9 kg 58 kg    Examination:  General exam: Appears calm and comfortable, icteric Respiratory system: Clear to auscultation. Respiratory effort normal. Cardiovascular system: S1 & S2 heard, RRR. No JVD, murmurs, rubs, gallops or clicks. No pedal edema. Gastrointestinal system: Abdomen is mildly distended, soft and nontender. No organomegaly or masses felt. Normal bowel sounds heard. Central nervous system: Alert and oriented. No focal neurological deficits. Extremities: Symmetric 5 x 5 power. Skin: No rashes, lesions or ulcers.  Psychiatry: Judgement and insight appear normal. Mood & affect appropriate.      Data Reviewed: I have personally reviewed following labs and imaging studies  CBC: Recent Labs  Lab 11/26/19 0212 11/27/19 0226 11/28/19 0257 11/29/19 0404 11/30/19 0441 12/01/19 0414 12/02/19 0352  WBC 19.6*   < > 25.6* 19.0* 25.7* 24.8* 28.1*  NEUTROABS 15.8*  --   --   --   --   --   --   HGB 7.4*   < > 7.5* 7.6* 7.5* 7.4* 7.0*  HCT 21.6*   < > 21.4* 22.4* 21.8* 21.2* 20.5*  MCV 116.8*   < > 115.7* 117.3* 119.8* 119.1* 120.6*  PLT 101*   < > 125* 124* 121* 120* 138*   < > = values in this interval not displayed.    Basic Metabolic Panel: Recent Labs  Lab 11/28/19 0257 11/29/19 0404  11/30/19 0441 11/30/19 1755 12/01/19 0414 12/02/19 0352  NA 133* 136 139  --  137 136  K 3.7 3.5 3.7  --  3.8 4.1  CL 97* 98 102  --  100 99  CO2 --  27 28  GLUCOSE 121* 117* 116*  --  107* 125*  BUN --  9 10  CREATININE 0.69 0.71 0.70  --  0.74 0.77  CALCIUM 8.0* 7.8* 7.8*  --  7.8* 7.7*  PHOS  --   --   --  5.5*  --   --     GFR: Estimated Creatinine Clearance: 102.7 mL/min (by C-G formula based on SCr of 0.77 mg/dL).  Liver Function Tests: Recent Labs  Lab 11/28/19 0257 11/29/19 0404 11/30/19 0441 12/01/19 0414 12/02/19 0352  AST 70* 71* 61* 56* 60*  ALT 26 32 38 41 45*  ALKPHOS 247* 235* 239* 219* 205*  BILITOT 20.6* 19.2* 16.3* 13.4*  13.8*  PROT 7.3 7.5 7.1 6.9 6.9  ALBUMIN 1.3* 1.4* 1.3* 1.3* 1.4*    CBG: No results for input(s): GLUCAP in the last 168 hours.   Recent Results (from the past 240 hour(s))  Respiratory Panel by RT PCR (Flu A&B, Covid) - Nasopharyngeal Swab     Status: None   Collection Time: 11/25/19  4:24 AM   Specimen: Nasopharyngeal Swab  Result Value Ref Range Status   SARS Coronavirus 2 by RT PCR NEGATIVE NEGATIVE Final    Comment: (NOTE) SARS-CoV-2 target nucleic acids are NOT DETECTED.  The SARS-CoV-2 RNA is generally detectable in upper respiratoy specimens during the acute phase of infection. The lowest concentration of SARS-CoV-2 viral copies this assay can detect is 131 copies/mL. A negative result does not preclude SARS-Cov-2 infection and should not be used as the sole basis for treatment or other patient management decisions. A negative result may occur with  improper specimen collection/handling, submission of specimen other than nasopharyngeal swab, presence of viral mutation(s) within the areas targeted by this assay, and inadequate number of viral copies (<131 copies/mL). A negative result must be combined with clinical observations, patient history, and epidemiological information. The expected result  is Negative.  Fact Sheet for Patients:  https://www.moore.com/  Fact Sheet for Healthcare Providers:  https://www.young.biz/  This test is no t yet approved or cleared by the [place] FDA and  has been authorized for detection and/or diagnosis of SARS-CoV-2 by FDA under an Emergency Use Authorization (EUA). This EUA will remain  in effect (meaning this test can be used) for the duration of the COVID-19 declaration under Section 564(b)(1) of the Act, 21 U.S.C. section 360bbb-3(b)(1), unless the authorization is terminated or revoked sooner.     Influenza A by PCR NEGATIVE NEGATIVE Final   Influenza B by PCR NEGATIVE NEGATIVE Final    Comment: (NOTE) The Xpert Xpress SARS-CoV-2/FLU/RSV assay is intended as an aid in  the diagnosis of influenza from Nasopharyngeal swab specimens and  should not be used as a sole basis for treatment. Nasal washings and  aspirates are unacceptable for Xpert Xpress SARS-CoV-2/FLU/RSV  testing.  Fact Sheet for Patients: https://www.moore.com/  Fact Sheet for Healthcare Providers: https://www.young.biz/  This test is not yet approved or cleared by the [place] FDA and  has been authorized for detection and/or diagnosis of SARS-CoV-2 by  FDA under an Emergency Use Authorization (EUA). This EUA will remain  in effect (meaning this test can be used) for the duration of the  Covid-19 declaration under Section 564(b)(1) of the Act, 21  U.S.C. section 360bbb-3(b)(1), unless the authorization is  terminated or revoked. Performed at [facility], [address], [place], [place] [address]   Gram stain     Status: None   Collection Time: 11/25/19  4:17 PM   Specimen: Abdomen; Peritoneal Fluid  Result Value Ref Range Status   Specimen Description FLUID PERITONEAL ABDOMEN  Final   Special Requests NONE  Final   Gram Stain   Final    WBC PRESENT, PREDOMINANTLY  MONONUCLEAR RED BLOOD CELLS PRESENT NO ORGANISMS SEEN CYTOSPIN SMEAR Performed at [facility], [address] [place], [place] [address]    Report Status 11/25/2019 FINAL  Final  Culture, body fluid-bottle     Status: None   Collection Time: 11/25/19  4:17 PM   Specimen: Fluid  Result Value Ref Range Status   Specimen Description FLUID PERITONEAL ABDOMEN  Final   Special Requests BOTTLES  DRAWN AEROBIC AND ANAEROBIC  Final   Culture   Final    NO GROWTH 5 DAYS Performed at [facility], [address], [place], [place] [address]    Report Status 11/30/2019 FINAL  Final         Radiology Studies: DG Swallowing Func-Speech Pathology  Result Date: 11/30/2019 Objective Swallowing Evaluation: Type of Study: MBS-Modified Barium Swallow Study  Patient Details Name: TAYT MOYERS MRN: [id number] Date of Birth: Aug 27, 1980 Today's Date: 11/30/2019 Time: SLP Start Time (ACUTE ONLY): 1410 -SLP Stop Time (ACUTE ONLY): 1435 SLP Time Calculation (min) (ACUTE ONLY): 25 min Past Medical History: Past Medical History: Diagnosis Date . Chronic back pain 03/02/2012 . Essential hypertension 03/02/2012 . Hypertension  Past Surgical History: Past Surgical History: Procedure Laterality Date . HAND SURGERY   . IR PARACENTESIS  11/25/2019 . WRIST SURGERY   HPI: 39 year old gentleman with prior history of hypertension, daily alcohol abuse presents to ED with jaundice, abdominal distention, back pain. He was admitted for acute decompensated liver failure secondary to EtOH abuseassociated with hyperbilirubinemia, portal hypertension, splenomegaly, esophageal varices, ascites.  CXR on 10/23 "concerning for aspiration and/or pneumonia".   Subjective: alert upright in chair for procedure Assessment / Plan / Recommendation CHL IP CLINICAL IMPRESSIONS 11/30/2019 Clinical Impression Pt presents with a mild oropharyngeal dysphagia. Oral deficits c/b delayed bolus propulsion and mild oral residuals. Pharyngeal  deficits c/b reduced base of tongue retraction, decreased laryngeal elevation, and reduced timing and efficiency of laryngeal vestibule closure. This allowed for isolated instance of pre and during the swallow laryngeal penetration (PAS-3) with thin liquids with straw use only. No further penetration or aspiration noted with any POs. Mild vallecular and pyriform sinus residuals noted with POs (greatest with thicker POs) thin liquid alternation or second swallows assisted to decrease residual amount. Pt and spouse educated on diet recommendations and safe swallowing strategies to maximize swallow safety and efficiency.  SLP Visit Diagnosis Dysphagia, oropharyngeal phase (R13.12) Attention and concentration deficit following -- Frontal lobe and executive function deficit following -- Impact on safety and function Mild aspiration risk   CHL IP TREATMENT RECOMMENDATION 11/30/2019 Treatment Recommendations Therapy as outlined in treatment plan below   Prognosis 11/30/2019 Prognosis for Safe Diet Advancement Good Barriers to Reach Goals -- Barriers/Prognosis Comment -- CHL IP DIET RECOMMENDATION 11/30/2019 SLP Diet Recommendations Regular solids;Thin liquid Liquid Administration via Cup;No straw Medication Administration Whole meds with puree Compensations Minimize environmental distractions;Slow rate;Small sips/bites;Follow solids with liquid Postural Changes Remain semi-upright after after feeds/meals (Comment)   CHL IP OTHER RECOMMENDATIONS 11/30/2019 Recommended Consults -- Oral Care Recommendations Oral care BID Other Recommendations --   CHL IP FOLLOW UP RECOMMENDATIONS 11/30/2019 Follow up Recommendations 24 hour supervision/assistance   CHL IP FREQUENCY AND DURATION 11/30/2019 Speech Therapy Frequency (ACUTE ONLY) min 1 x/week Treatment Duration 1 week      CHL IP ORAL PHASE 11/30/2019 Oral Phase Impaired Oral - Pudding Teaspoon -- Oral - Pudding Cup -- Oral - Honey Teaspoon -- Oral - Honey Cup -- Oral - Nectar  Teaspoon -- Oral - Nectar Cup Weak lingual manipulation;Reduced posterior propulsion;Delayed oral transit;Lingual/palatal residue Oral - Nectar Straw -- Oral - Thin Teaspoon -- Oral - Thin Cup Delayed oral transit;Lingual/palatal residue Oral - Thin Straw Delayed oral transit;Lingual/palatal residue Oral - Puree Delayed oral transit;Reduced posterior propulsion;Lingual/palatal residue Oral - Mech Soft Delayed oral transit;Lingual/palatal residue;Piecemeal swallowing;Reduced posterior propulsion Oral - Regular -- Oral - Multi-Consistency -- Oral - Pill Delayed oral transit;Lingual/palatal residue Oral Phase -  Comment --  CHL IP PHARYNGEAL PHASE 11/30/2019 Pharyngeal Phase Impaired Pharyngeal- Pudding Teaspoon -- Pharyngeal -- Pharyngeal- Pudding Cup -- Pharyngeal -- Pharyngeal- Honey Teaspoon -- Pharyngeal -- Pharyngeal- Honey Cup -- Pharyngeal -- Pharyngeal- Nectar Teaspoon -- Pharyngeal -- Pharyngeal- Nectar Cup Reduced tongue base retraction;Pharyngeal residue - valleculae Pharyngeal -- Pharyngeal- Nectar Straw -- Pharyngeal -- Pharyngeal- Thin Teaspoon -- Pharyngeal -- Pharyngeal- Thin Cup Penetration/Aspiration during swallow;Reduced tongue base retraction;Reduced laryngeal elevation;Pharyngeal residue - valleculae;Pharyngeal residue - pyriform Pharyngeal Material does not enter airway Pharyngeal- Thin Straw Penetration/Aspiration before swallow;Penetration/Aspiration during swallow;Pharyngeal residue - pyriform;Pharyngeal residue - valleculae;Reduced epiglottic inversion;Reduced tongue base retraction;Reduced laryngeal elevation Pharyngeal Material enters airway, remains ABOVE vocal cords and not ejected out Pharyngeal- Puree Delayed swallow initiation-vallecula;Reduced tongue base retraction;Reduced laryngeal elevation;Pharyngeal residue - valleculae;Pharyngeal residue - pyriform Pharyngeal -- Pharyngeal- Mechanical Soft Pharyngeal residue - valleculae;Pharyngeal residue - pyriform;Reduced laryngeal  elevation;Reduced tongue base retraction Pharyngeal -- Pharyngeal- Regular -- Pharyngeal -- Pharyngeal- Multi-consistency -- Pharyngeal -- Pharyngeal- Pill Pharyngeal residue - valleculae;Pharyngeal residue - pyriform;Reduced laryngeal elevation;Reduced tongue base retraction Pharyngeal -- Pharyngeal Comment --  CHL IP CERVICAL ESOPHAGEAL PHASE 11/30/2019 Cervical Esophageal Phase WFL Pudding Teaspoon -- Pudding Cup -- Honey Teaspoon -- Honey Cup -- Nectar Teaspoon -- Nectar Cup -- Nectar Straw -- Thin Teaspoon -- Thin Cup -- Thin Straw -- Puree -- Mechanical Soft -- Regular -- Multi-consistency -- Pill -- Cervical Esophageal Comment -- Chelsea E Hartness MA, CCC-SLP 11/30/2019, 3:05 PM              VAS [place] LOWER EXTREMITY VENOUS (DVT)  Result Date: 12/01/2019  Lower Venous DVTStudy Indications: Swelling, and tenderness.  Comparison Study: No prior studies. Performing Technologist: Jean Rosenthal  Examination Guidelines: A complete evaluation includes B-mode imaging, spectral Doppler, color Doppler, and power Doppler as needed of all accessible portions of each vessel. Bilateral testing is considered an integral part of a complete examination. Limited examinations for reoccurring indications may be performed as noted. The reflux portion of the exam is performed with the patient in reverse Trendelenburg.  +---------+---------------+---------+-----------+----------+--------------+ RIGHT    CompressibilityPhasicitySpontaneityPropertiesThrombus Aging +---------+---------------+---------+-----------+----------+--------------+ CFV      Full           Yes      Yes                                 +---------+---------------+---------+-----------+----------+--------------+ SFJ      Full                                                        +---------+---------------+---------+-----------+----------+--------------+ FV Prox  Full                                                         +---------+---------------+---------+-----------+----------+--------------+ FV Mid   Full                                                        +---------+---------------+---------+-----------+----------+--------------+  FV DistalFull                                                        +---------+---------------+---------+-----------+----------+--------------+ PFV      Full                                                        +---------+---------------+---------+-----------+----------+--------------+ POP      Full           Yes      Yes                                 +---------+---------------+---------+-----------+----------+--------------+ PTV      Full                                                        +---------+---------------+---------+-----------+----------+--------------+ PERO     Full                                                        +---------+---------------+---------+-----------+----------+--------------+   +---------+---------------+---------+-----------+----------+--------------+ LEFT     CompressibilityPhasicitySpontaneityPropertiesThrombus Aging +---------+---------------+---------+-----------+----------+--------------+ CFV      Full           Yes      Yes                                 +---------+---------------+---------+-----------+----------+--------------+ SFJ      Full                                                        +---------+---------------+---------+-----------+----------+--------------+ FV Prox  Full                                                        +---------+---------------+---------+-----------+----------+--------------+ FV Mid   Full                                                        +---------+---------------+---------+-----------+----------+--------------+ FV DistalFull                                                         +---------+---------------+---------+-----------+----------+--------------+  PFV      Full                                                        +---------+---------------+---------+-----------+----------+--------------+ POP      Full           Yes      Yes                                 +---------+---------------+---------+-----------+----------+--------------+ PTV      Full                                                        +---------+---------------+---------+-----------+----------+--------------+ PERO     Full                                                        +---------+---------------+---------+-----------+----------+--------------+     Summary: RIGHT: - There is no evidence of deep vein thrombosis in the lower extremity.  - No cystic structure found in the popliteal fossa.  LEFT: - There is no evidence of deep vein thrombosis in the lower extremity.  - No cystic structure found in the popliteal fossa.  *See table(s) above for measurements and observations. Electronically signed by Coral ElseVance Brabham MD on 12/01/2019 at 8:53:30 PM.    Final         Scheduled Meds: . furosemide  40 mg Oral Daily  . lactulose  30 g Oral BID  . nicotine  21 mg Transdermal Daily  . pantoprazole  40 mg Oral Daily  . prednisoLONE  40 mg Oral Daily  . rifaximin  550 mg Oral BID  . spironolactone  100 mg Oral Daily  . thiamine  100 mg Oral Daily   Continuous Infusions: . sodium chloride 1,000 mL (11/28/19 1826)  . ampicillin-sulbactam (UNASYN) IV 3 g (12/02/19 1240)     LOS: 6 days     Hughie Clossavi Tewana Bohlen, MD [facility]   To contact the attending provider between 7A-7P or the covering provider during after hours 7P-7A, please log into the web site [email or web address] and access using universal [place] password for that web site. If you do not have the password, please call the hospital operator.  12/02/2019, 1:03 PM

## 2019-12-03 ENCOUNTER — Other Ambulatory Visit: Payer: Self-pay | Admitting: Internal Medicine

## 2019-12-03 DIAGNOSIS — K7011 Alcoholic hepatitis with ascites: Secondary | ICD-10-CM

## 2019-12-03 DIAGNOSIS — K7010 Alcoholic hepatitis without ascites: Secondary | ICD-10-CM | POA: Diagnosis not present

## 2019-12-03 DIAGNOSIS — K7201 Acute and subacute hepatic failure with coma: Secondary | ICD-10-CM | POA: Diagnosis not present

## 2019-12-03 LAB — COMPREHENSIVE METABOLIC PANEL
ALT: 50 U/L — ABNORMAL HIGH (ref 0–44)
AST: 50 U/L — ABNORMAL HIGH (ref 15–41)
Albumin: 1.4 g/dL — ABNORMAL LOW (ref 3.5–5.0)
Alkaline Phosphatase: 208 U/L — ABNORMAL HIGH (ref 38–126)
Anion gap: 9 (ref 5–15)
BUN: 7 mg/dL (ref 6–20)
CO2: 26 mmol/L (ref 22–32)
Calcium: 7.3 mg/dL — ABNORMAL LOW (ref 8.9–10.3)
Chloride: 98 mmol/L (ref 98–111)
Creatinine, Ser: 0.8 mg/dL (ref 0.61–1.24)
GFR, Estimated: >60 mL/min (ref >60)
Glucose, Bld: 144 mg/dL — ABNORMAL HIGH (ref 70–99)
Potassium: 3.6 mmol/L (ref 3.5–5.1)
Sodium: 133 mmol/L — ABNORMAL LOW (ref 135–145)
Total Bilirubin: 12.8 mg/dL — ABNORMAL HIGH (ref 0.3–1.2)
Total Protein: 7.1 g/dL (ref 6.5–8.1)

## 2019-12-03 LAB — BPAM RBC
Blood Product Expiration Date: 202111282359
ISSUE DATE / TIME: 202110281356
Unit Type and Rh: 5100

## 2019-12-03 LAB — TYPE AND SCREEN
ABO/RH(D): O POS
Antibody Screen: NEGATIVE
Unit division: 0

## 2019-12-03 LAB — CBC WITH DIFFERENTIAL/PLATELET
Abs Immature Granulocytes: 1.21 10*3/uL — ABNORMAL HIGH (ref 0.00–0.07)
Basophils Absolute: 0.1 10*3/uL (ref 0.0–0.1)
Basophils Relative: 0 %
Eosinophils Absolute: 0.1 10*3/uL (ref 0.0–0.5)
Eosinophils Relative: 0 %
HCT: 25.7 % — ABNORMAL LOW (ref 39.0–52.0)
Hemoglobin: 8.9 g/dL — ABNORMAL LOW (ref 13.0–17.0)
Immature Granulocytes: 5 %
Lymphocytes Relative: 5 %
Lymphs Abs: 1.3 10*3/uL (ref 0.7–4.0)
MCH: 38.9 pg — ABNORMAL HIGH (ref 26.0–34.0)
MCHC: 34.6 g/dL (ref 30.0–36.0)
MCV: 112.2 fL — ABNORMAL HIGH (ref 80.0–100.0)
Monocytes Absolute: 2.3 10*3/uL — ABNORMAL HIGH (ref 0.1–1.0)
Monocytes Relative: 9 %
Neutro Abs: 21.2 10*3/uL — ABNORMAL HIGH (ref 1.7–7.7)
Neutrophils Relative %: 81 %
Platelets: 121 10*3/uL — ABNORMAL LOW (ref 150–400)
RBC: 2.29 MIL/uL — ABNORMAL LOW (ref 4.22–5.81)
RDW: 19 % — ABNORMAL HIGH (ref 11.5–15.5)
WBC: 26.1 10*3/uL — ABNORMAL HIGH (ref 4.0–10.5)
nRBC: 0 % (ref 0.0–0.2)

## 2019-12-03 MED ORDER — GABAPENTIN 100 MG PO CAPS
100.0000 mg | ORAL_CAPSULE | Freq: Two times a day (BID) | ORAL | Status: DC
  Administered 2019-12-03 – 2019-12-04 (×3): 100 mg via ORAL
  Filled 2019-12-03 (×3): qty 1

## 2019-12-03 NOTE — Progress Notes (Signed)
Physical Therapy Treatment Patient Details Name: Zachary Mata MRN: [id number] DOB: 1980/08/15 Today's Date: 12/03/2019    History of Present Illness Zachary Mata is a 39 y.o. male with medical history significant for hypertension, ED, daily alcohol use presents with chief concern of yellowing skin, abdominal swelling x 4 weeks.  He presents today because he was not feeling well. He reports he drinks etoh daily and does not count the number of drinks he has. He drinks throughout the day and his last drink was approximately 11 pm.    PT Comments    Pt tolerates treatment well with improved ambulation tolerance this session. Pt with improved stability when utilizing BUE support of RW as opposed to UE support of IV pole in previous sessions. While endurance is improved compared to previous sessions, the pt does continue to fatigue quickly. Pt will benefit from continued acute PT POC to improve dynamic gait and balance, as well as activity tolerance. PT continues to recommend discharge home with HHPT and a RW.  Follow Up Recommendations  Home health PT;Supervision/Assistance - 24 hour     Equipment Recommendations  Rolling walker with 5" wheels    Recommendations for Other Services       Precautions / Restrictions Precautions Precautions: Fall Restrictions Weight Bearing Restrictions: No    Mobility  Bed Mobility Overal bed mobility: Needs Assistance Bed Mobility: Supine to Sit;Sit to Supine     Supine to sit: Supervision;HOB elevated Sit to supine: Supervision;HOB elevated      Transfers Overall transfer level: Needs assistance Equipment used: None;Rolling walker (2 wheeled) Transfers: Sit to/from Stand Sit to Stand: Supervision            Ambulation/Gait Ambulation/Gait assistance: Min guard Gait Distance (Feet): 80 Feet Assistive device: Rolling walker (2 wheeled) Gait Pattern/deviations: Trunk flexed;Step-through pattern Gait velocity: reduced Gait velocity  interpretation: 1.31 - 2.62 ft/sec, indicative of limited community ambulator General Gait Details: pt with shortened step-through gait pattern, slight increase in trunk flexion likely due to insufficient walker height   Stairs             Wheelchair Mobility    Modified Rankin (Stroke Patients Only)       Balance Overall balance assessment: Needs assistance Sitting-balance support: No upper extremity supported;Feet supported Sitting balance-Leahy Scale: Good Sitting balance - Comments: supervision   Standing balance support: Single extremity supported;Bilateral upper extremity supported Standing balance-Leahy Scale: Poor Standing balance comment: reliant on unilateral UE support of RW or minG                            Cognition Arousal/Alertness: Awake/alert Behavior During Therapy: WFL for tasks assessed/performed Overall Cognitive Status: Impaired/Different from baseline Area of Impairment: Problem solving                             Problem Solving: Slow processing        Exercises      General Comments General comments (skin integrity, edema, etc.): VSS on RA      Pertinent Vitals/Pain Pain Assessment: Faces Faces Pain Scale: Hurts little more Pain Location: abdomen Pain Descriptors / Indicators: Aching Pain Intervention(s): Monitored during session    Home Living                      Prior Function  PT Goals (current goals can now be found in the care plan section) Acute Rehab PT Goals Patient Stated Goal: to return home Progress towards PT goals: Progressing toward goals    Frequency    Min 3X/week      PT Plan Current plan remains appropriate    Co-evaluation              AM-PAC PT "6 Clicks" Mobility   Outcome Measure  Help needed turning from your back to your side while in a flat bed without using bedrails?: None Help needed moving from lying on your back to sitting on the  side of a flat bed without using bedrails?: None Help needed moving to and from a bed to a chair (including a wheelchair)?: None Help needed standing up from a chair using your arms (e.g., wheelchair or bedside chair)?: None Help needed to walk in hospital room?: A Little Help needed climbing 3-5 steps with a railing? : A Lot 6 Click Score: 21    End of Session   Activity Tolerance: Patient tolerated treatment well Patient left: in bed;with call bell/phone within reach;with bed alarm set;with family/visitor present Nurse Communication: Mobility status PT Visit Diagnosis: Unsteadiness on feet (R26.81);Muscle weakness (generalized) (M62.81);Difficulty in walking, not elsewhere classified (R26.2);History of falling (Z91.81)     Time: 1610-9604 PT Time Calculation (min) (ACUTE ONLY): 15 min  Charges:  $Gait Training: 8-22 mins                     Arlyss Gandy, PT, DPT Acute Rehabilitation Pager: [phone]    Arlyss Gandy 12/03/2019, 10:36 AM

## 2019-12-03 NOTE — TOC Progression Note (Signed)
Transition of Care [facility]) - Progression Note    Patient Details  Name: [name] MRN: [id number] Date of Birth: February 11, 1980  [facility]) CM/SW Contact  Kermit Balo, RN Phone Number: 12/03/2019, 12:30 PM  Clinical Narrative:    Dan Humphreys for home ordered through [employer] and will be delivered to the room. HH arranged through [facility].  TOC following for further d/c needs.    Expected Discharge Plan: Home w Home Health Services Barriers to Discharge: Continued Medical Work up  Expected Discharge Plan and Services Expected Discharge Plan: Home w Home Health Services   Discharge Planning Services: CM Consult Post Acute Care Choice: Home Health, Durable Medical Equipment Living arrangements for the past 2 months: Single Family Home                 DME Arranged: Walker rolling DME Agency: [employer] Date DME Agency Contacted: 12/03/19   Representative spoke with at DME Agency: sheila HH Arranged: PT HH Agency: [facility] Date [facility] Agency Contacted: 12/03/19   Representative spoke with at [facility] Agency: Kandee Keen   Social Determinants of Health (SDOH) Interventions    Readmission Risk Interventions No flowsheet data found.

## 2019-12-03 NOTE — Evaluation (Signed)
Occupational Therapy Evaluation Patient Details Name: Zachary Mata MRN: [id number] DOB: 1980/07/25 Today's Date: 12/03/2019    History of Present Illness Zachary Mata is a 39 y.o. male with medical history significant for hypertension, ED, daily alcohol use presents with chief concern of yellowing skin, abdominal swelling x 4 weeks.  He presents today because he was not feeling well. He reports he drinks etoh daily and does not count the number of drinks he has. He drinks throughout the day and his last drink was approximately 11 pm.   Clinical Impression   PT admitted with HTN and ETOH use. Pt currently with functional limitiations due to the deficits listed below (see OT problem list). Pt currently with balance deficits that require min (A) for safety with ADLS such as showering.  Pt will benefit from skilled OT to increase their independence and safety with adls and balance to allow discharge HHOT.     Follow Up Recommendations  No OT follow up    Equipment Recommendations  None recommended by OT    Recommendations for Other Services       Precautions / Restrictions Precautions Precautions: Fall      Mobility Bed Mobility Overal bed mobility: Modified Independent                  Transfers Overall transfer level: Needs assistance   Transfers: Sit to/from Stand Sit to Stand: Min guard         General transfer comment: reaching for iv pole but able to progress past hand on IV pole    Balance           Standing balance support: Single extremity supported;During functional activity Standing balance-Leahy Scale: Fair                             ADL either performed or assessed with clinical judgement   ADL Overall ADL's : Needs assistance/impaired Eating/Feeding: Independent   Grooming: Wash/dry hands;Supervision/safety               Lower Body Dressing: Supervision/safety   Toilet Transfer: Supervision/safety       Tub/  Shower Transfer: Walk-in shower;Minimal assistance;Ambulation Tub/Shower Transfer Details (indicate cue type and reason): reaching for grab bars Functional mobility during ADLs: Supervision/safety General ADL Comments: wife present entire session and answering questions. wife will be home 24/7 working even from home to (A). educated on movement for all meals to help with activity tolerance and UB HEP encouraged. pt is smoker and encouraged to continue the soberity from smoking      Vision Baseline Vision/History: No visual deficits       Perception     Praxis      Pertinent Vitals/Pain Pain Assessment: 0-10 Faces Pain Scale: Hurts worst Pain Location: back Pain Descriptors / Indicators: Discomfort;Constant;Aching Pain Intervention(s): Monitored during session;Premedicated before session;Repositioned     Hand Dominance Right   Extremity/Trunk Assessment Upper Extremity Assessment Upper Extremity Assessment: Generalized weakness   Lower Extremity Assessment Lower Extremity Assessment: Defer to PT evaluation   Cervical / Trunk Assessment Cervical / Trunk Assessment: Normal   Communication Communication Communication: No difficulties   Cognition Arousal/Alertness: Awake/alert Behavior During Therapy: WFL for tasks assessed/performed Overall Cognitive Status: Impaired/Different from baseline Area of Impairment: Safety/judgement                         Safety/Judgement: Decreased awareness of deficits  General Comments  VSS RA    Exercises     Shoulder Instructions      Home Living Family/patient expects to be discharged to:: Private residence Living Arrangements: Spouse/significant other Available Help at Discharge: Family Type of Home: House Home Access: Stairs to enter Secretary/administrator of Steps: 4   Home Layout: One level     Bathroom Shower/Tub: Producer, television/film/video: Standard     Home Equipment: Hand held shower  head;Shower seat - built in   Additional Comments: animal : Advertising copywriter      Prior Functioning/Environment Level of Independence: Independent                 OT Problem List: Decreased strength;Decreased activity tolerance;Impaired balance (sitting and/or standing);Decreased safety awareness;Decreased knowledge of use of DME or AE;Decreased knowledge of precautions      OT Treatment/Interventions: Self-care/ADL training;Therapeutic exercise;DME and/or AE instruction;Therapeutic activities;Patient/family education;Balance training    OT Goals(Current goals can be found in the care plan section) Acute Rehab OT Goals Patient Stated Goal: to return home OT Goal Formulation: With patient/family Time For Goal Achievement: 12/17/19 Potential to Achieve Goals: Good  OT Frequency: Min 2X/week   Barriers to D/C:            Co-evaluation              AM-PAC OT "6 Clicks" Daily Activity     Outcome Measure Help from another person eating meals?: None Help from another person taking care of personal grooming?: None Help from another person toileting, which includes using toliet, bedpan, or urinal?: A Little Help from another person bathing (including washing, rinsing, drying)?: A Little Help from another person to put on and taking off regular upper body clothing?: None Help from another person to put on and taking off regular lower body clothing?: A Little 6 Click Score: 21   End of Session Nurse Communication: Mobility status;Precautions  Activity Tolerance: Patient tolerated treatment well Patient left: in bed;with call bell/phone within reach;with bed alarm set;with family/visitor present  OT Visit Diagnosis: Unsteadiness on feet (R26.81);Muscle weakness (generalized) (M62.81)                Time: 5035-4656 OT Time Calculation (min): 34 min Charges:  OT General Charges $OT Visit: 1 Visit OT Evaluation $OT Eval Moderate Complexity: 1 Mod OT Treatments $Self  Care/Home Management : 8-22 mins   Brynn, OTR/L  Acute Rehabilitation Services Pager: [phone] Office: [phone] .   Mateo Flow 12/03/2019, 3:38 PM

## 2019-12-03 NOTE — Consult Note (Signed)
[facility]   Reason for Consult:  Depression, Anxiety, and request for meds Referring Physician:  Dr. Jacqulyn Bath"Pahwani Patient Identification: Zachary MussLarry C Mata MRN:  [id number] Principal Diagnosis: Alcoholic hepatitis without ascites Diagnosis:  Principal Problem:   Alcoholic hepatitis without ascites Active Problems:   Essential hypertension   Alcohol abuse   Jaundice due to hepatitis   Hypokalemia   Alcohol cessation counseling   Alcohol withdrawal (HCC)   Liver failure, acute   Alcoholic hepatitis with ascites   Total Time spent with patient: 30 minutes  Subjective:   Zachary Mata is a 39 y.o. male patient admitted with alcohol hepatitis.  Psychiatric consult was placed at the request of his wife, who is also at the bedside.  She reports concerns regarding ongoing anxiety and depression, that had led to self medicating with alcohol.  She states that patient drinks daily, on average about 10+ beers a day.  She reports that he wakes up in the morning very moody and anxious, patient agrees.  Upon speaking with the patient he reports his condition has worsened as it relates to alcohol use, and he no longer wishes to drink after this hospital visit.  He does show some improved mood and insight and states he is interested in alcohol rehabilitation.  He reports wanting to complete rehabilitation at home as he has tried several inpatient facilities and have been unsuccessful previously.  He denies any suicide attempts.  He denies any disruptive behaviors, life altering behaviors secondary to his alcohol use.  He denies any additional or illicit substance use.  He denies any current legal charges.  He denies any history of aggression, agitation, and or violence, homicidal ideations.  He denies any auditory or visual hallucinations.  HPI:  Zachary Mata is a 39 y.o. male with medical history significant for hypertension, ED, daily alcohol use presents with chief concern of yellowing  skin, abdominal swelling x 4 weeks.  He presents today because he was not feeling well. He reports he drinks etoh daily and does not count the number of drinks he has. He drinks throughout the day and his last drink was approximately 11 pm.  Past Psychiatric History: Anxiety and depression.  Denies any current medications.   Reports previous inpatient admissions secondary to alcohol use and rehabilitation. Risk to Self:  Denies Risk to Others:  Denies Prior Inpatient Therapy:  Yes secondary to alcohol use Prior Outpatient Therapy:  Denies  Past Medical History:  Past Medical History:  Diagnosis Date   Chronic back pain 03/02/2012   Essential hypertension 03/02/2012   Hypertension     Past Surgical History:  Procedure Laterality Date   HAND SURGERY     IR PARACENTESIS  11/25/2019   WRIST SURGERY     Family History:  Family History  Problem Relation Age of Onset   Cancer Mother        breats CA   Diabetes Mother    Breast cancer Other    Family Psychiatric  History: Denies Social History:  Social History   Substance and Sexual Activity  Alcohol Use Yes   Alcohol/week: 6.0 - 7.0 standard drinks   Types: 6 - 7 Cans of beer per week     Social History   Substance and Sexual Activity  Drug Use No    Social History   Socioeconomic History   Marital status: Married    Spouse name: Not on file   Number of children: Not on file  Years of education: Not on file   Highest education level: Not on file  Occupational History   Not on file  Tobacco Use   Smoking status: Current Every Day Smoker    Packs/day: 2.00    Years: 15.00    Pack years: 30.00    Types: Cigarettes   Smokeless tobacco: Never Used  Vaping Use   Vaping Use: Never used  Substance and Sexual Activity   Alcohol use: Yes    Alcohol/week: 6.0 - 7.0 standard drinks    Types: 6 - 7 Cans of beer per week   Drug use: No   Sexual activity: Yes  Other Topics Concern   Not on file   Social History Narrative   Not on file   Social Determinants of Health   Financial Resource Strain:    Difficulty of Paying Living Expenses: Not on file  Food Insecurity:    Worried About Running Out of Food in the Last Year: Not on file   Ran Out of Food in the Last Year: Not on file  Transportation Needs:    Lack of Transportation (Medical): Not on file   Lack of Transportation (Non-Medical): Not on file  Physical Activity:    Days of Exercise per Week: Not on file   Minutes of Exercise per Session: Not on file  Stress:    Feeling of Stress : Not on file  Social Connections:    Frequency of Communication with Friends and Family: Not on file   Frequency of Social Gatherings with Friends and Family: Not on file   Attends Religious Services: Not on file   Active Member of Clubs or Organizations: Not on file   Attends Banker Meetings: Not on file   Marital Status: Not on file   Additional Social History:    Allergies:   Allergies  Allergen Reactions   Sulfa Antibiotics Swelling    Labs:  Results for orders placed or performed during the hospital encounter of 11/25/19 (from the past 48 hour(s))  Ammonia     Status: Abnormal   Collection Time: 12/01/19  2:16 PM  Result Value Ref Range   Ammonia 50 (H) 9 - 35 umol/L    Comment: Performed at [facility], [address] [place], [place] [address]  CBC     Status: Abnormal   Collection Time: 12/02/19  3:52 AM  Result Value Ref Range   WBC 28.1 (H) 4.0 - 10.5 K/uL   RBC 1.70 (L) 4.22 - 5.81 MIL/uL   Hemoglobin 7.0 (L) 13.0 - 17.0 g/dL   HCT 76.7 (L) 39 - 52 %   MCV 120.6 (H) 80.0 - 100.0 fL   MCH 41.2 (H) 26.0 - 34.0 pg   MCHC 34.1 30.0 - 36.0 g/dL   RDW 34.1 93.7 - 90.2 %   Platelets 138 (L) 150 - 400 K/uL    Comment: REPEATED TO VERIFY   nRBC 0.0 0.0 - 0.2 %    Comment: Performed at [facility], [address] [place], [place] [address]  Comprehensive metabolic panel      Status: Abnormal   Collection Time: 12/02/19  3:52 AM  Result Value Ref Range   Sodium 136 135 - 145 mmol/L   Potassium 4.1 3.5 - 5.1 mmol/L   Chloride 99 98 - 111 mmol/L   CO2 28 22 - 32 mmol/L   Glucose, Bld 125 (H) 70 - 99 mg/dL    Comment: Glucose reference range  applies only to samples taken after fasting for at least 8 hours.   BUN 10 6 - 20 mg/dL   Creatinine, Ser 1.91 0.61 - 1.24 mg/dL   Calcium 7.7 (L) 8.9 - 10.3 mg/dL   Total Protein 6.9 6.5 - 8.1 g/dL   Albumin 1.4 (L) 3.5 - 5.0 g/dL   AST 60 (H) 15 - 41 U/L   ALT 45 (H) 0 - 44 U/L   Alkaline Phosphatase 205 (H) 38 - 126 U/L   Total Bilirubin 13.8 (H) 0.3 - 1.2 mg/dL   GFR, Estimated >47 >82 mL/min    Comment: (NOTE) Calculated using the CKD-EPI Creatinine Equation (2021)    Anion gap 9 5 - 15    Comment: Performed at [facility], [address] [place], [place] [address]  Protime-INR     Status: Abnormal   Collection Time: 12/02/19  3:52 AM  Result Value Ref Range   Prothrombin Time 22.0 (H) 11.4 - 15.2 seconds   INR 2.0 (H) 0.8 - 1.2    Comment: (NOTE) INR goal varies based on device and disease states. Performed at [facility], [address], [place], [place] [address]   Ammonia     Status: Abnormal   Collection Time: 12/02/19  3:52 AM  Result Value Ref Range   Ammonia 71 (H) 9 - 35 umol/L    Comment: Performed at [facility], [address], [place], [place] [address]  ABO/Rh     Status: None   Collection Time: 12/02/19  3:52 AM  Result Value Ref Range   ABO/RH(D)      O POS Performed at [facility], [address], [place], [place] [address]   Vitamin B12     Status: Abnormal   Collection Time: 12/02/19 11:32 AM  Result Value Ref Range   Vitamin B-12 2,392 (H) 180 - 914 pg/mL    Comment: (NOTE) This assay is not validated for testing neonatal or myeloproliferative syndrome specimens for Vitamin B12 levels. Performed at [facility], [address]  [place], [place] [address]   Prepare RBC (crossmatch)     Status: None   Collection Time: 12/02/19 11:32 AM  Result Value Ref Range   Order Confirmation      ORDER PROCESSED BY BLOOD BANK Performed at [facility], [address] [place], [place] [address]   Type and screen [facility]     Status: None   Collection Time: 12/02/19 11:38 AM  Result Value Ref Range   ABO/RH(D) O POS    Antibody Screen NEG    Sample Expiration 12/05/2019,2359    Unit Number [id number]    Blood Component Type RBC LR PHER2    Unit division 00    Status of Unit ISSUED,FINAL    Transfusion Status OK TO TRANSFUSE    Crossmatch Result      Compatible Performed at [facility], [address] [place], [place] [address]   CBC with Differential/Platelet     Status: Abnormal   Collection Time: 12/03/19  2:40 AM  Result Value Ref Range   WBC 26.1 (H) 4.0 - 10.5 K/uL   RBC 2.29 (L) 4.22 - 5.81 MIL/uL   Hemoglobin 8.9 (L) 13.0 - 17.0 g/dL    Comment: REPEATED TO VERIFY POST TRANSFUSION SPECIMEN    HCT 25.7 (L) 39 - 52 %   MCV 112.2 (H) 80.0 - 100.0 fL  MCH 38.9 (H) 26.0 - 34.0 pg   MCHC 34.6 30.0 - 36.0 g/dL   RDW 36.6 (H) 44.0 - 34.7 %   Platelets 121 (L) 150 - 400 K/uL    Comment: REPEATED TO VERIFY Immature Platelet Fraction may be clinically indicated, consider ordering this additional test [id number]    nRBC 0.0 0.0 - 0.2 %   Neutrophils Relative % 81 %   Neutro Abs 21.2 (H) 1.7 - 7.7 K/uL   Lymphocytes Relative 5 %   Lymphs Abs 1.3 0.7 - 4.0 K/uL   Monocytes Relative 9 %   Monocytes Absolute 2.3 (H) 0.1 - 1.0 K/uL   Eosinophils Relative 0 %   Eosinophils Absolute 0.1 0.0 - 0.5 K/uL   Basophils Relative 0 %   Basophils Absolute 0.1 0.0 - 0.1 K/uL   Immature Granulocytes 5 %   Abs Immature Granulocytes 1.21 (H) 0.00 - 0.07 K/uL    Comment: Performed at [facility], [address], [place], [place] [address]  Comprehensive metabolic panel     Status:  Abnormal   Collection Time: 12/03/19  2:40 AM  Result Value Ref Range   Sodium 133 (L) 135 - 145 mmol/L   Potassium 3.6 3.5 - 5.1 mmol/L   Chloride 98 98 - 111 mmol/L   CO2 26 22 - 32 mmol/L   Glucose, Bld 144 (H) 70 - 99 mg/dL    Comment: Glucose reference range applies only to samples taken after fasting for at least 8 hours.   BUN 7 6 - 20 mg/dL   Creatinine, Ser 3.29 0.61 - 1.24 mg/dL   Calcium 7.3 (L) 8.9 - 10.3 mg/dL   Total Protein 7.1 6.5 - 8.1 g/dL   Albumin 1.4 (L) 3.5 - 5.0 g/dL   AST 50 (H) 15 - 41 U/L   ALT 50 (H) 0 - 44 U/L   Alkaline Phosphatase 208 (H) 38 - 126 U/L   Total Bilirubin 12.8 (H) 0.3 - 1.2 mg/dL   GFR, Estimated >51 >88 mL/min    Comment: (NOTE) Calculated using the CKD-EPI Creatinine Equation (2021)    Anion gap 9 5 - 15    Comment: Performed at [facility], [address], [place], [place] [address]    Current Facility-Administered Medications  Medication Dose Route Frequency Provider Last Rate Last Admin   0.9 %  sodium chloride infusion   Intravenous PRN Kathlen Mody, MD 10 mL/hr at 11/28/19 1826 1,000 mL at 11/28/19 1826   Ampicillin-Sulbactam (UNASYN) 3 g in sodium chloride 0.9 % 100 mL IVPB  3 g Intravenous Q6H Pahwani, Ravi, MD 200 mL/hr at 12/03/19 1258 3 g at 12/03/19 1258   furosemide (LASIX) tablet 40 mg  40 mg Oral Daily Charlie Pitter III, MD   40 mg at 12/03/19 0904   gabapentin (NEURONTIN) capsule 100 mg  100 mg Oral BID Maryagnes Amos, FNP   100 mg at 12/03/19 1256   lactulose (CHRONULAC) 10 GM/15ML solution 30 g  30 g Oral BID Dianah Field, PA-C   30 g at 12/03/19 6301   morphine 2 MG/ML injection 0.5 mg  0.5 mg Intravenous Q4H PRN Kathlen Mody, MD   0.5 mg at 12/03/19 0730   nicotine (NICODERM CQ - dosed in mg/24 hours) patch 21 mg  21 mg Transdermal Daily Kathlen Mody, MD   21 mg at 12/03/19 0905   pantoprazole (PROTONIX) EC tablet 40 mg  40 mg Oral Daily Kathlen Mody, MD  40 mg at 12/03/19 0905    prednisoLONE tablet 40 mg  40 mg Oral Daily Meredith Pel, NP   40 mg at 12/03/19 3007   Resource ThickenUp Clear   Oral PRN Kathlen Mody, MD       rifaximin [employer]) tablet 550 mg  550 mg Oral BID Charlie Pitter III, MD   550 mg at 12/03/19 6226   spironolactone (ALDACTONE) tablet 100 mg  100 mg Oral Daily Charlie Pitter III, MD   100 mg at 12/03/19 3335   thiamine tablet 100 mg  100 mg Oral Daily Cox, Amy N, DO   100 mg at 12/03/19 4562   traMADol (ULTRAM) tablet 100 mg  100 mg Oral Q6H PRN Kathlen Mody, MD   100 mg at 12/03/19 0904    Musculoskeletal: Strength & Muscle Tone: within normal limits Gait & Station: normal Patient leans: N/A  Psychiatric Specialty Exam: Physical Exam  Review of Systems  Blood pressure 118/86, pulse 74, temperature 98.7 F (37.1 C), temperature source Oral, resp. rate (!) 8, height 5\' 6"  (1.676 m), weight 59.7 kg, SpO2 98 %.Body mass index is 21.24 kg/m.  General Appearance: Fairly Groomed  Eye Contact:  Fair  Speech:  Clear and Coherent and Normal Rate  Volume:  Normal  Mood:  Depressed  Affect:  Appropriate and Congruent  Thought Process:  Coherent, Linear and Descriptions of Associations: Intact  Orientation:  Full (Time, Place, and Person)  Thought Content:  Logical  Suicidal Thoughts:  No  Homicidal Thoughts:  No  Memory:  Immediate;   Fair Recent;   Poor  Judgement:  Intact  Insight:  Present  Psychomotor Activity:  Normal  Concentration:  Concentration: Fair and Attention Span: Fair  Recall:  of Knowledge:  Fair  Language:  Fair  Akathisia:  No  Handed:  Right  AIMS (if indicated):     Assets:  Communication Skills Desire for Improvement Financial Resources/Insurance Housing Leisure Time Transportation  ADL's:  Intact  Cognition:  WNL  Sleep:        Treatment Plan Summary: Plan Reviewed treatment options with both wife and patient.  Due to ongoing alcohol hepatitis, possibly liver cirrhosis will  refrain from using medications that are metabolized in the liver.  Will start gabapentin 100 mg p.o. twice daily to target symptoms of anxiety and alcohol withdrawal.  Current length of stay is 8 days, patient has completed detox however has a long road ahead for alcohol rehabilitation in his recovery.  He declines inpatient treatment at this time.  Will make recommendation for referrals to substance abuse intensive outpatient in [facility].  Wife expressed some hesitancy regarding referrals to [facility] or [facility].  Current [facility] programs through call behavioral health RN in person.  Disposition: No evidence of imminent risk to self or others at present.   Patient does not meet criteria for psychiatric inpatient admission. Refer to IOP.  [place], FNP 12/03/2019 1:24 PM

## 2019-12-03 NOTE — Progress Notes (Signed)
   Patient Name: Zachary Mata Date of Encounter: 12/03/2019, 3:43 PM    Subjective  More energy today after getting transfusion was able to walk without assistance.  Saw psychiatry and gabapentin started.  Plan for outpatient follow-up for alcoholism depression and anxiety.   Objective  BP 118/86 (BP Location: Right Arm)   Pulse 74   Temp 98.7 F (37.1 C) (Oral)   Resp (!) 8   Ht 5\' 6"  (1.676 m)   Wt 59.7 kg   SpO2 98%   BMI 21.24 kg/m  Chronically ill with scleral icterus and overall jaundice Abdomen with less ascites nontender No edema in the lower extremities Alert and oriented x3 without asterixis but mentation remains somewhat sluggish. Patient's wife present during entire visit Lab Results  Component Value Date   ALT 50 (H) 12/03/2019   AST 50 (H) 12/03/2019   ALKPHOS 208 (H) 12/03/2019   BILITOT 12.8 (H) 12/03/2019   Lab Results  Component Value Date   WBC 26.1 (H) 12/03/2019   HGB 8.9 (L) 12/03/2019   HCT 25.7 (L) 12/03/2019   MCV 112.2 (H) 12/03/2019   PLT 121 (L) 12/03/2019   Lab Results  Component Value Date   CREATININE 0.80 12/03/2019   BUN 7 12/03/2019   NA 133 (L) 12/03/2019   K 3.6 12/03/2019   CL 98 12/03/2019   CO2 26 12/03/2019    -15.6 L since admission    Assessment and Plan   Severe acute alcoholic hepatitis with suggestion of cirrhosis based upon enlarged lateral segment left lobe on CT, biochemically improved on prednisolone. Ascites, no SBP on admission Clinically mild pneumonia on imaging Hepatic encephalopathy, slowly improving on lactulose and rifaximin Generalized deconditioning Alcohol abuse until time of admission. 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  Improved after transfusion Malnutrition   He is better with more energy though still significantly ill.  I think he has reached maximal hospital benefit and discharge tomorrow is  reasonable.  Recommendations as follows:  #1 continue prednisolone at 40 mg daily usually we do this for 4 weeks and then do a 2-week taper after that.  #2 continue current medications  #3 low-sodium diet discussed in brief with patient's wife  #4 lab follow-up at our office Tuesday, November 2 see discharge follow-up information for further details.  Once labs reviewed we will arrange an outpatient appointment with Dr. 06-04-1975.  Call if there are issues tomorrow but otherwise not planning to see him  Myrtie Neither, MD, [facility] 12/03/2019 3:43 PM

## 2019-12-03 NOTE — Progress Notes (Signed)
PROGRESS NOTE    Zachary Mata  ZOX:[id number] DOB: 11-Jan-1981 DOA: 11/25/2019 PCP: Monica Bectonhekkekandam, Thomas J, MD    Chief Complaint  Patient presents with  . Abdominal Pain  . Chest Pain    Brief Narrative:   39 year old gentleman with prior history of hypertension, daily alcohol abuse presented to ED with jaundice, abdominal distention, back pain. he drinks etoh daily and does not count the number of drinks he has. He was admitted for alcoholic hepatitis and acute decompensated liver failure secondary to EtOH abuse associated with hyperbilirubinemia, portal hypertension, splenomegaly, esophageal varices, ascites. GI consulted for further evaluation.  Diagnostic paracentesis done on 11/26/2019 and ascitic fluid was negative for infection. Patient was started empirically on IV Rocephin for possible SBP. Th cultures from the ascitic fluid negative so far.  Subsequently was diagnosed with aspiration pneumonia and his IV Rocephin was switched to IV Zosyn.  His Madrey score was 44.  He was started on prednisone for alcoholic hepatitis.  He also came in with acute hepatic encephalopathy which resolved after starting lactulose.  Patient has been alert and oriented for last 2 to 3 days.  He also had leg edema, DVT was ruled out.  Also had acute blood loss anemia and dropped his hemoglobin to 7.0 and received 1 unit of blood transfusion on 12/02/2019.  Hemoglobin improved and stable since then.  Due to his anxiety and depression, psychiatry was consulted and they started him on gabapentin.  Patient was cleared by psychiatry as well as GI.   Assessment & Plan:   Principal Problem:   Alcoholic hepatitis without ascites Active Problems:   Essential hypertension   Alcohol abuse   Jaundice due to hepatitis   Hypokalemia   Alcohol cessation counseling   Alcohol withdrawal (HCC)   Liver failure, acute   Alcoholic hepatitis with ascites   Decompensated liver cirrhosis with ascites/ Alcoholic  hepatitis: Associated with EtOH hepatitis, CT of the abdomen pelvis shows liver cirrhosis, splenomegaly, esophageal varices, portal hypertension and ascites. GI on board and appreciate recommendations.  Ultrasound paracentesis performed on 11/26/2019 and ascitic fluid negative for infection.  He was initially started on Rocephin later on discontinued and apparently switched to Unasyn for reasons unclear to me.  Acetic fluid analysis does not suggest any infection.  We will discontinue Unasyn today. Patient was started on lactulose 30 g twice daily, rifaximin, Lasix 40 mg daily and spironolactone 100 mg daily by gastroenterology. Maddrey discriminant function is 44 on admission, steroids started.  Some improvement in bilirubin but transaminases stable.  Small bowel and colon wall thickening Incidental finding on CT abdomen.  Patient has no symptoms currently.  Essential Hypertension: Blood pressure soft.  Lisinopril on hold.  Hypokalemia: Resolved.  Alcohol abuse:  TOC consulted for resources.  On CIWA protocol for alcohol withdrawal symptoms.  No withdrawal symptoms at this point in time.  Leg edema and tenderness: DVT ruled out.  Anemia or thrombocytopenia: Secondary to chronic alcoholism and liver cirrhosis.  Seems to have some component of acute blood loss anemia secondary to esophageal varices.  Platelets are stable but hemoglobin dropped to 7.0 on 12/02/2019 for which she was transfused 1 unit of PRBC.  Hemoglobin over 8 today.  He is feeling more energetic today.   Aspiration pneumonia Chest x-ray shows Layering small left pleural effusion with overlying left basilar opacities, concerning for aspiration and/or pneumonia.  Antibiotics switched from Rocephin to Unasyn.  Will be completing 5 days today.  Will discontinue after this.  Esophageal  varices:  Will probably need an EGD in the future.   Hyponatremia: Resolved.  Acute hepatic encephalopathy: Resolved.  He is alert and  oriented.  Anxiety and depression: Psychiatry consulted.  He is a started on gabapentin.  DVT prophylaxis:scd's Code Status: (FULL CODE Family Communication: Wife at bedside. Disposition:   Status is: Inpatient  The patient will require care spanning > 2 midnights and should be moved to inpatient because: Hemodynamically unstable, Ongoing diagnostic testing needed not appropriate for outpatient work up, Unsafe d/c plan, IV treatments appropriate due to intensity of illness or inability to take PO and Inpatient level of care appropriate due to severity of illness  Dispo: The patient is from: Home              Anticipated d/c is to: Home with home health.  He seemed ready to me this morning.  Sent message to GI this morning to see him and clear him however he was seen around 3:30 PM and per GI, they recommend discharging tomorrow.              Anticipated d/c date is: 1 day              Patient currently is not medically stable to d/c.       Consultants:   Gastroenterology    Procedures; Paracentesis about 820 ml fluid taken off.   Antimicrobials: rocephin sine admission   Subjective: Patient seen and examined early this morning.  Wife was at the bedside.  Patient looks much more alert and brighter today.  He had no complaint.  Objective: Vitals:   12/03/19 0200 12/03/19 0400 12/03/19 0816 12/03/19 1109  BP:  117/81 102/68 118/86  Pulse:   79 74  Resp:  13 12 (!) 8  Temp:  98.1 F (36.7 C) 98.6 F (37 C) 98.7 F (37.1 C)  TempSrc:  Oral Oral Oral  SpO2:   97% 98%  Weight: 59.7 kg     Height:        Intake/Output Summary (Last 24 hours) at 12/03/2019 1551 Last data filed at 12/03/2019 1300 Gross per 24 hour  Intake 404 ml  Output 1780 ml  Net -1376 ml   Filed Weights   12/01/19 0500 12/02/19 0426 12/03/19 0200  Weight: 60.9 kg 58 kg 59.7 kg    Examination:  General exam: Appears calm and comfortable, slightly icteric Respiratory system: Clear to  auscultation. Respiratory effort normal. Cardiovascular system: S1 & S2 heard, RRR. No JVD, murmurs, rubs, gallops or clicks. No pedal edema. Gastrointestinal system: Abdomen is nondistended, soft and nontender. No organomegaly or masses felt. Normal bowel sounds heard. Central nervous system: Alert and oriented. No focal neurological deficits. Extremities: Symmetric 5 x 5 power. Skin: No rashes, lesions or ulcers.  Psychiatry: Judgement and insight appear normal. Mood & affect appropriate.    Data Reviewed: I have personally reviewed following labs and imaging studies  CBC: Recent Labs  Lab 11/29/19 0404 11/29/19 0404 11/30/19 0441 12/01/19 0414 12/02/19 0352 12/03/19 0240  WBC 19.0*  --  25.7* 24.8* 28.1* 26.1*  NEUTROABS  --   --   --   --   --  21.2*  HGB 7.6*  --  7.5* 7.4* 7.0* 8.9*  HCT 22.4*   < > 21.8* 21.2*  19.2* 20.5* 25.7*  MCV 117.3*  --  119.8* 119.1* 120.6* 112.2*  PLT 124*  --  121* 120* 138* 121*   < > = values in this interval not  displayed.    Basic Metabolic Panel: Recent Labs  Lab 11/29/19 0404 11/30/19 0441 11/30/19 1755 12/01/19 0414 12/02/19 0352 12/03/19 0240  NA 136 139  --  137 136 133*  K 3.5 3.7  --  3.8 4.1 3.6  CL 98 102  --  100 99 98  CO2 28 29  --  27 28 26   GLUCOSE 117* 116*  --  107* 125* 144*  BUN 8 10  --  9 10 7   CREATININE 0.71 0.70  --  0.74 0.77 0.80  CALCIUM 7.8* 7.8*  --  7.8* 7.7* 7.3*  PHOS  --   --  5.5*  --   --   --     GFR: Estimated Creatinine Clearance: 105.7 mL/min (by C-G formula based on SCr of 0.8 mg/dL).  Liver Function Tests: Recent Labs  Lab 11/29/19 0404 11/30/19 0441 12/01/19 0414 12/02/19 0352 12/03/19 0240  AST 71* 61* 56* 60* 50*  ALT 32 38 41 45* 50*  ALKPHOS 235* 239* 219* 205* 208*  BILITOT 19.2* 16.3* 13.4* 13.8* 12.8*  PROT 7.5 7.1 6.9 6.9 7.1  ALBUMIN 1.4* 1.3* 1.3* 1.4* 1.4*    CBG: No results for input(s): GLUCAP in the last 168 hours.   Recent Results (from the past 240  hour(s))  Respiratory Panel by RT PCR (Flu A&B, Covid) - Nasopharyngeal Swab     Status: None   Collection Time: 11/25/19  4:24 AM   Specimen: Nasopharyngeal Swab  Result Value Ref Range Status   SARS Coronavirus 2 by RT PCR NEGATIVE NEGATIVE Final    Comment: (NOTE) SARS-CoV-2 target nucleic acids are NOT DETECTED.  The SARS-CoV-2 RNA is generally detectable in upper respiratoy specimens during the acute phase of infection. The lowest concentration of SARS-CoV-2 viral copies this assay can detect is 131 copies/mL. A negative result does not preclude SARS-Cov-2 infection and should not be used as the sole basis for treatment or other patient management decisions. A negative result may occur with  improper specimen collection/handling, submission of specimen other than nasopharyngeal swab, presence of viral mutation(s) within the areas targeted by this assay, and inadequate number of viral copies (<131 copies/mL). A negative result must be combined with clinical observations, patient history, and epidemiological information. The expected result is Negative.  Fact Sheet for Patients:  12/05/19  Fact Sheet for Healthcare Providers:  11/27/19  This test is no t yet approved or cleared by the https://www.moore.com/ FDA and  has been authorized for detection and/or diagnosis of SARS-CoV-2 by FDA under an Emergency Use Authorization (EUA). This EUA will remain  in effect (meaning this test can be used) for the duration of the COVID-19 declaration under Section 564(b)(1) of the Act, 21 U.S.C. section 360bbb-3(b)(1), unless the authorization is terminated or revoked sooner.     Influenza A by PCR NEGATIVE NEGATIVE Final   Influenza B by PCR NEGATIVE NEGATIVE Final    Comment: (NOTE) The Xpert Xpress SARS-CoV-2/FLU/RSV assay is intended as an aid in  the diagnosis of influenza from Nasopharyngeal swab specimens and  should not  be used as a sole basis for treatment. Nasal washings and  aspirates are unacceptable for Xpert Xpress SARS-CoV-2/FLU/RSV  testing.  Fact Sheet for Patients: https://www.young.biz/  Fact Sheet for Healthcare Providers: [place]  This test is not yet approved or cleared by the https://www.moore.com/ FDA and  has been authorized for detection and/or diagnosis of SARS-CoV-2 by  FDA under an Emergency Use Authorization (EUA). This EUA  will remain  in effect (meaning this test can be used) for the duration of the  Covid-19 declaration under Section 564(b)(1) of the Act, 21  U.S.C. section 360bbb-3(b)(1), unless the authorization is  terminated or revoked. Performed at [facility], [address] [place], [place] [address]   Gram stain     Status: None   Collection Time: 11/25/19  4:17 PM   Specimen: Abdomen; Peritoneal Fluid  Result Value Ref Range Status   Specimen Description FLUID PERITONEAL ABDOMEN  Final   Special Requests NONE  Final   Gram Stain   Final    WBC PRESENT, PREDOMINANTLY MONONUCLEAR RED BLOOD CELLS PRESENT NO ORGANISMS SEEN CYTOSPIN SMEAR Performed at [facility], [address], [place], [place] [address]    Report Status 11/25/2019 FINAL  Final  Culture, body fluid-bottle     Status: None   Collection Time: 11/25/19  4:17 PM   Specimen: Fluid  Result Value Ref Range Status   Specimen Description FLUID PERITONEAL ABDOMEN  Final   Special Requests BOTTLES DRAWN AEROBIC AND ANAEROBIC  Final   Culture   Final    NO GROWTH 5 DAYS Performed at [facility], [address] [place], [place] [address]    Report Status 11/30/2019 FINAL  Final         Radiology Studies: No results found.      Scheduled Meds: . furosemide  40 mg Oral Daily  . gabapentin  100 mg Oral BID  . lactulose  30 g Oral BID  . nicotine  21 mg Transdermal Daily  . pantoprazole  40 mg Oral Daily  . prednisoLONE   40 mg Oral Daily  . rifaximin  550 mg Oral BID  . spironolactone  100 mg Oral Daily  . thiamine  100 mg Oral Daily   Continuous Infusions: . sodium chloride 1,000 mL (11/28/19 1826)     LOS: 7 days     Hughie Closs, MD [facility]   To contact the attending provider between 7A-7P or the covering provider during after hours 7P-7A, please log into the web site [email or web address] and access using universal [place] password for that web site. If you do not have the password, please call the hospital operator.  12/03/2019, 3:51 PM

## 2019-12-04 ENCOUNTER — Encounter: Payer: Self-pay | Admitting: Internal Medicine

## 2019-12-04 ENCOUNTER — Telehealth: Payer: Self-pay | Admitting: Internal Medicine

## 2019-12-04 DIAGNOSIS — K7010 Alcoholic hepatitis without ascites: Secondary | ICD-10-CM | POA: Diagnosis not present

## 2019-12-04 LAB — COMPREHENSIVE METABOLIC PANEL
ALT: 54 U/L — ABNORMAL HIGH (ref 0–44)
AST: 47 U/L — ABNORMAL HIGH (ref 15–41)
Albumin: 1.5 g/dL — ABNORMAL LOW (ref 3.5–5.0)
Alkaline Phosphatase: 227 U/L — ABNORMAL HIGH (ref 38–126)
Anion gap: 11 (ref 5–15)
BUN: 10 mg/dL (ref 6–20)
CO2: 24 mmol/L (ref 22–32)
Calcium: 7.7 mg/dL — ABNORMAL LOW (ref 8.9–10.3)
Chloride: 95 mmol/L — ABNORMAL LOW (ref 98–111)
Creatinine, Ser: 0.74 mg/dL (ref 0.61–1.24)
GFR, Estimated: >60 mL/min (ref >60)
Glucose, Bld: 166 mg/dL — ABNORMAL HIGH (ref 70–99)
Potassium: 3.6 mmol/L (ref 3.5–5.1)
Sodium: 130 mmol/L — ABNORMAL LOW (ref 135–145)
Total Bilirubin: 11.9 mg/dL — ABNORMAL HIGH (ref 0.3–1.2)
Total Protein: 7.1 g/dL (ref 6.5–8.1)

## 2019-12-04 LAB — CBC WITH DIFFERENTIAL/PLATELET
Abs Immature Granulocytes: 0.9 10*3/uL — ABNORMAL HIGH (ref 0.00–0.07)
Basophils Absolute: 0 10*3/uL (ref 0.0–0.1)
Basophils Relative: 0 %
Eosinophils Absolute: 0.1 10*3/uL (ref 0.0–0.5)
Eosinophils Relative: 0 %
HCT: 27.1 % — ABNORMAL LOW (ref 39.0–52.0)
Hemoglobin: 9.5 g/dL — ABNORMAL LOW (ref 13.0–17.0)
Immature Granulocytes: 3 %
Lymphocytes Relative: 5 %
Lymphs Abs: 1.3 10*3/uL (ref 0.7–4.0)
MCH: 39.1 pg — ABNORMAL HIGH (ref 26.0–34.0)
MCHC: 35.1 g/dL (ref 30.0–36.0)
MCV: 111.5 fL — ABNORMAL HIGH (ref 80.0–100.0)
Monocytes Absolute: 2.2 10*3/uL — ABNORMAL HIGH (ref 0.1–1.0)
Monocytes Relative: 8 %
Neutro Abs: 23.9 10*3/uL — ABNORMAL HIGH (ref 1.7–7.7)
Neutrophils Relative %: 84 %
Platelets: 137 10*3/uL — ABNORMAL LOW (ref 150–400)
RBC: 2.43 MIL/uL — ABNORMAL LOW (ref 4.22–5.81)
RDW: 18.4 % — ABNORMAL HIGH (ref 11.5–15.5)
WBC: 28.5 10*3/uL — ABNORMAL HIGH (ref 4.0–10.5)
nRBC: 0 % (ref 0.0–0.2)

## 2019-12-04 MED ORDER — PREDNISOLONE 5 MG PO TABS: 40.0000 mg | ORAL_TABLET | Freq: Every day | ORAL | 0 refills | Status: DC

## 2019-12-04 MED ORDER — GABAPENTIN 100 MG PO CAPS: 100.0000 mg | ORAL_CAPSULE | Freq: Two times a day (BID) | ORAL | 0 refills | Status: DC

## 2019-12-04 MED ORDER — FUROSEMIDE 40 MG PO TABS: 40.0000 mg | ORAL_TABLET | Freq: Every day | ORAL | 0 refills | Status: DC

## 2019-12-04 MED ORDER — SPIRONOLACTONE 100 MG PO TABS
100.0000 mg | ORAL_TABLET | Freq: Every day | ORAL | 0 refills | Status: DC
Start: 2019-12-04 — End: 2020-01-11

## 2019-12-04 MED ORDER — PANTOPRAZOLE SODIUM 40 MG PO TBEC: 40.0000 mg | DELAYED_RELEASE_TABLET | Freq: Every day | ORAL | 0 refills | Status: DC

## 2019-12-04 MED ORDER — LACTULOSE 10 GM/15ML PO SOLN: 30.0000 g | Freq: Two times a day (BID) | ORAL | 0 refills | Status: DC

## 2019-12-04 MED ORDER — PREDNISOLONE 15 MG/5ML PO SOLN: ORAL | 0 refills | Status: DC

## 2019-12-04 MED ORDER — TRAMADOL HCL 50 MG PO TABS: 50.0000 mg | ORAL_TABLET | Freq: Two times a day (BID) | ORAL | 0 refills | Status: DC | PRN

## 2019-12-04 MED ORDER — RIFAXIMIN 550 MG PO TABS: 550.0000 mg | ORAL_TABLET | Freq: Two times a day (BID) | ORAL | 0 refills | Status: AC

## 2019-12-04 NOTE — Telephone Encounter (Signed)
Prednisolone tabs not available at pharmacy and exorbitant requiring prior auth so changed to liquid which is generic  Xifaxan also needs prior auth - and exorbitatnt and no generic Leave on lactulose and we will sort out in f/u if Xifaxan needed

## 2019-12-04 NOTE — Discharge Summary (Signed)
Physician Discharge Summary  Zachary MussLarry C Sookram YNW:[id number] DOB: 11-28-1980 DOA: 11/25/2019  PCP: Monica Bectonhekkekandam, Thomas J, MD  Admit date: 11/25/2019 Discharge date: 12/04/2019  Admitted From: Home Disposition: Home  Recommendations for Outpatient Follow-up:  1. Follow up with PCP in 1-2 weeks 2. Follow with GI next week. 3. Please obtain BMP/CBC in one week 4. Please follow up with your PCP on the following pending results: Unresulted Labs (From admission, onward)          Start     Ordered   Unscheduled  Occult blood card to lab, stool  As needed,   R      12/02/19 1303           Home Health: Yes Equipment/Devices: Bedside commode  Discharge Condition: Stable CODE STATUS: Full code Diet recommendation: Low-sodium  Subjective: Seen and examined.  Wife at the bedside.  Feels much better.  No complaints.  Brief/Interim Summary: 39 year old gentleman with prior history of hypertension, daily alcohol abuse presented to ED with jaundice, abdominal distention, back pain. he drinks etoh daily and does not count the number of drinks he has. He was admitted for alcoholic hepatitis and acute decompensated liver failure secondary to EtOH abuseassociated with hyperbilirubinemia, portal hypertension, splenomegaly, esophageal varices, ascites. GI consulted for further evaluation. Diagnostic paracentesis done on 11/26/2019 and ascitic fluid was negative for infection.Patient was started empirically on IV Rocephin for possible SBP. Th cultures from the ascitic fluid negative so far.  Subsequently was diagnosed with aspiration pneumonia and his IV Rocephin was switched to IV Zosyn and he received that for 5 days.  His Madrey score was 44.  He was started on prednisone for alcoholic hepatitis.  He also came in with acute hepatic encephalopathy which resolved after starting lactulose.  Patient has been alert and oriented for last 2 to 3 days.  He also had leg edema, DVT was ruled out.  Also had  acute blood loss anemia and dropped his hemoglobin to 7.0 and received 1 unit of blood transfusion on 12/02/2019.  Hemoglobin improved and stable since then.  Due to his anxiety and depression, psychiatry was consulted and they started him on gabapentin.  Patient was cleared by psychiatry as well as GI.  GI recommended discharging on several medications as below.  They recommended discharging on prednisone 40 mg for a month and then taper over 2 weeks.  I have ordered prednisone for 30 days for him.  Since he is going to see GI before the taper start so I will defer to GI for prescribing tapering prednisone doses for him.  I have conveyed this to the patient and his wife at the bedside.  Discharge Diagnoses:  Principal Problem:   Alcoholic hepatitis without ascites Active Problems:   Essential hypertension   Alcohol abuse   Jaundice due to hepatitis   Hypokalemia   Alcohol cessation counseling   Alcohol withdrawal (HCC)   Liver failure, acute   Alcoholic hepatitis with ascites    Discharge Instructions   Allergies as of 12/04/2019      Reactions   Sulfa Antibiotics Swelling      Medication List    STOP taking these medications   Anoro Ellipta 62.5-25 MCG/INH Aepb Generic drug: umeclidinium-vilanterol   azelastine 0.05 % ophthalmic solution Commonly known as: OPTIVAR   busPIRone 15 MG tablet Commonly known as: BUSPAR   fluticasone 50 MCG/ACT nasal spray Commonly known as: Flonase   lisinopril 20 MG tablet Commonly known as: ZESTRIL   tadalafil  5 MG tablet Commonly known as: Cialis   Vitamin D (Ergocalciferol) 1.25 MG (50000 UNIT) Caps capsule Commonly known as: DRISDOL     TAKE these medications   furosemide 40 MG tablet Commonly known as: LASIX Take 1 tablet (40 mg total) by mouth daily.   gabapentin 100 MG capsule Commonly known as: NEURONTIN Take 1 capsule (100 mg total) by mouth 2 (two) times daily.   lactulose 10 GM/15ML solution Commonly known as:  CHRONULAC Take 45 mLs (30 g total) by mouth 2 (two) times daily.   pantoprazole 40 MG tablet Commonly known as: PROTONIX Take 1 tablet (40 mg total) by mouth daily.   prednisoLONE 5 MG Tabs tablet Take 8 tablets (40 mg total) by mouth daily.   rifaximin 550 MG Tabs tablet Commonly known as: XIFAXAN Take 1 tablet (550 mg total) by mouth 2 (two) times daily.   spironolactone 100 MG tablet Commonly known as: ALDACTONE Take 1 tablet (100 mg total) by mouth daily.   traMADol 50 MG tablet Commonly known as: ULTRAM Take 1 tablet (50 mg total) by mouth every 12 (twelve) hours as needed for up to 5 doses for severe pain.            Durable Medical Equipment  (From admission, onward)         Start     Ordered   12/03/19 1229  For home use only DME Walker rolling  Once       Question Answer Comment  Walker: With 5 Inch Wheels   Patient needs a walker to treat with the following condition Weakness      12/03/19 1228          Follow-up Information    [facility] Follow up.   Specialty: Home Health Services Why: The home health agency will contact you for the first home visit. Contact information: [address] [place] [address] [phone]        Iva Boop, MD Follow up on 12/07/2019.   Specialty: Gastroenterology Why: Come to lab in basement for bloodwork. Dr. Leone Payor will coordinate with Dr. Myrtie Neither and arrange follow-up after review of labs Contact information: [address] [place] [address] [phone]        Monica Becton, MD Follow up in 1 week(s).   Specialties: Family Medicine, Sports Medicine, Radiology Contact information: [phone] [address] [place] [address] [phone]              Allergies  Allergen Reactions  . Sulfa Antibiotics Swelling    Consultations: GI   Procedures/Studies: DG Chest 2 View  Result Date: 11/27/2019 CLINICAL DATA:  Chest pain. EXAM: CHEST  - 2 VIEW COMPARISON:  11/25/2019 FINDINGS: Low lung volumes. Layering small left pleural effusion with overlying left basilar opacities. Right upper lobe scarring. No discernible pneumothorax. Cardiomediastinal silhouette is accentuated by low lung volumes. No acute osseous abnormality. IMPRESSION: Layering small left pleural effusion with overlying left basilar opacities, concerning for aspiration and/or pneumonia. Electronically Signed   By: Feliberto Harts MD   On: 11/27/2019 16:44   DG Chest 2 View  Result Date: 11/25/2019 CLINICAL DATA:  39 year old male with chest pain, shortness of breath. EXAM: CHEST - 2 VIEW COMPARISON:  Chest CT 03/05/2019 and earlier. FINDINGS: Lower lung volumes with small new bilateral pleural effusions. Hazy opacity in the visible upper abdomen suspicious for ascites. Visible bowel-gas pattern within normal limits. Mediastinal contours remain normal. No pneumothorax  or pulmonary edema. Chronic right upper lobe scarring and architectural distortion is stable. Visualized tracheal air column is within normal limits. No other acute pulmonary opacity. No acute osseous abnormality identified. IMPRESSION: 1. Lower lung volumes with new small pleural effusions. 2. Possible ascites. 3. Right upper lobe lung scarring. Electronically Signed   By: Odessa Fleming M.D.   On: 11/25/2019 02:09   DG Thoracic Spine 2 View  Result Date: 11/25/2019 CLINICAL DATA:  Back pain EXAM: THORACIC SPINE 2 VIEWS COMPARISON:  03/02/2012 FINDINGS: There is no evidence of thoracic spine fracture. Alignment is normal. Intervertebral disc heights are preserved. No other significant bone abnormalities are identified. IMPRESSION: Negative. Electronically Signed   By: Duanne Guess D.O.   On: 11/25/2019 16:38   DG Lumbar Spine 2-3 Views  Result Date: 11/25/2019 CLINICAL DATA:  Back pain EXAM: LUMBAR SPINE - 2-3 VIEW COMPARISON:  03/02/2012 FINDINGS: There is no evidence of lumbar spine fracture. Alignment is  normal. Intervertebral disc spaces are maintained. No apparent facet joint arthropathy. Excreted contrast present within the bladder. IMPRESSION: Negative. Electronically Signed   By: Duanne Guess D.O.   On: 11/25/2019 16:37   CT ABDOMEN PELVIS W CONTRAST  Result Date: 11/25/2019 CLINICAL DATA:  Nausea, vomiting, jaundice, abdominal distention. EXAM: CT ABDOMEN AND PELVIS WITH CONTRAST TECHNIQUE: Multidetector CT imaging of the abdomen and pelvis was performed using the standard protocol following bolus administration of intravenous contrast. CONTRAST:  OMNIPAQUE IOHEXOL 300 MG/ML  SOLN COMPARISON:  09/27/2017 FINDINGS: Lower chest: Atelectasis in the lung bases. Minimal bilateral effusions. Hepatobiliary: Diffuse fatty infiltration of the liver. Enlarged lateral segment left lobe suggest cirrhosis. Portal veins are patent. Gallbladder wall is thickened and there is pericholecystic edema, likely related to liver disease and ascites. No stones identified. No bile duct dilatation. Pancreas: Unremarkable. No pancreatic ductal dilatation or surrounding inflammatory changes. Spleen: Spleen is enlarged.  No focal lesions. Adrenals/Urinary Tract: Adrenal glands are unremarkable. Kidneys are normal, without renal calculi, focal lesion, or hydronephrosis. Bladder is unremarkable. Stomach/Bowel: Stomach, small bowel, and colon are not abnormally distended. There is evidence of diffuse small bowel wall thickening and colonic wall thickening and edema. These changes may represent enterocolitis. Cirrhotic colopathy would also be a possible consideration. If the patient is on ACE inhibitors, this could also cause bowel wall thickening. Vascular/Lymphatic: Normal caliber abdominal aorta. No significant lymphadenopathy. Upper abdominal varices and lower esophageal varices are present. Reproductive: Prostate is unremarkable. Other: Diffuse free fluid throughout the abdomen and pelvis consistent with ascites.  Musculoskeletal: No acute or significant osseous findings. IMPRESSION: 1. Diffuse fatty infiltration of the liver.  Probable cirrhosis. 2. Portal venous hypertension with splenic enlargement, upper abdominal varices, and lower esophageal varices. 3. Diffuse small bowel and colonic wall thickening and edema may represent enterocolitis. Cirrhotic colopathy would also be a possible consideration. If the patient is on ACE inhibitors, this could also cause bowel wall thickening. 4. Diffuse free fluid throughout the abdomen and pelvis consistent with ascites. 5. Atelectasis in the lung bases with minimal bilateral effusions. Electronically Signed   By: Burman Nieves M.D.   On: 11/25/2019 06:00   DG Swallowing Func-Speech Pathology  Result Date: 11/30/2019 Objective Swallowing Evaluation: Type of Study: MBS-Modified Barium Swallow Study  Patient Details Name: KANAV KAZMIERCZAK MRN: [id number] Date of Birth: [birth year] Today's Date: 11/30/2019 Time: SLP Start Time (ACUTE ONLY): 1410 -SLP Stop Time (ACUTE ONLY): 1435 SLP Time Calculation (min) (ACUTE ONLY): 25 min Past Medical History: Past Medical History: Diagnosis Date .  Chronic back pain 03/02/2012 . Essential hypertension 03/02/2012 . Hypertension  Past Surgical History: Past Surgical History: Procedure Laterality Date . HAND SURGERY   . IR PARACENTESIS  11/25/2019 . WRIST SURGERY   HPI: 39 year old gentleman with prior history of hypertension, daily alcohol abuse presents to ED with jaundice, abdominal distention, back pain. He was admitted for acute decompensated liver failure secondary to EtOH abuseassociated with hyperbilirubinemia, portal hypertension, splenomegaly, esophageal varices, ascites.  CXR on 10/23 "concerning for aspiration and/or pneumonia".   Subjective: alert upright in chair for procedure Assessment / Plan / Recommendation CHL IP CLINICAL IMPRESSIONS 11/30/2019 Clinical Impression Pt presents with a mild oropharyngeal dysphagia. Oral deficits c/b  delayed bolus propulsion and mild oral residuals. Pharyngeal deficits c/b reduced base of tongue retraction, decreased laryngeal elevation, and reduced timing and efficiency of laryngeal vestibule closure. This allowed for isolated instance of pre and during the swallow laryngeal penetration (PAS-3) with thin liquids with straw use only. No further penetration or aspiration noted with any POs. Mild vallecular and pyriform sinus residuals noted with POs (greatest with thicker POs) thin liquid alternation or second swallows assisted to decrease residual amount. Pt and spouse educated on diet recommendations and safe swallowing strategies to maximize swallow safety and efficiency.  SLP Visit Diagnosis Dysphagia, oropharyngeal phase (R13.12) Attention and concentration deficit following -- Frontal lobe and executive function deficit following -- Impact on safety and function Mild aspiration risk   CHL IP TREATMENT RECOMMENDATION 11/30/2019 Treatment Recommendations Therapy as outlined in treatment plan below   Prognosis 11/30/2019 Prognosis for Safe Diet Advancement Good Barriers to Reach Goals -- Barriers/Prognosis Comment -- CHL IP DIET RECOMMENDATION 11/30/2019 SLP Diet Recommendations Regular solids;Thin liquid Liquid Administration via Cup;No straw Medication Administration Whole meds with puree Compensations Minimize environmental distractions;Slow rate;Small sips/bites;Follow solids with liquid Postural Changes Remain semi-upright after after feeds/meals (Comment)   CHL IP OTHER RECOMMENDATIONS 11/30/2019 Recommended Consults -- Oral Care Recommendations Oral care BID Other Recommendations --   CHL IP FOLLOW UP RECOMMENDATIONS 11/30/2019 Follow up Recommendations 24 hour supervision/assistance   CHL IP FREQUENCY AND DURATION 11/30/2019 Speech Therapy Frequency (ACUTE ONLY) min 1 x/week Treatment Duration 1 week      CHL IP ORAL PHASE 11/30/2019 Oral Phase Impaired Oral - Pudding Teaspoon -- Oral - Pudding Cup --  Oral - Honey Teaspoon -- Oral - Honey Cup -- Oral - Nectar Teaspoon -- Oral - Nectar Cup Weak lingual manipulation;Reduced posterior propulsion;Delayed oral transit;Lingual/palatal residue Oral - Nectar Straw -- Oral - Thin Teaspoon -- Oral - Thin Cup Delayed oral transit;Lingual/palatal residue Oral - Thin Straw Delayed oral transit;Lingual/palatal residue Oral - Puree Delayed oral transit;Reduced posterior propulsion;Lingual/palatal residue Oral - Mech Soft Delayed oral transit;Lingual/palatal residue;Piecemeal swallowing;Reduced posterior propulsion Oral - Regular -- Oral - Multi-Consistency -- Oral - Pill Delayed oral transit;Lingual/palatal residue Oral Phase - Comment --  CHL IP PHARYNGEAL PHASE 11/30/2019 Pharyngeal Phase Impaired Pharyngeal- Pudding Teaspoon -- Pharyngeal -- Pharyngeal- Pudding Cup -- Pharyngeal -- Pharyngeal- Honey Teaspoon -- Pharyngeal -- Pharyngeal- Honey Cup -- Pharyngeal -- Pharyngeal- Nectar Teaspoon -- Pharyngeal -- Pharyngeal- Nectar Cup Reduced tongue base retraction;Pharyngeal residue - valleculae Pharyngeal -- Pharyngeal- Nectar Straw -- Pharyngeal -- Pharyngeal- Thin Teaspoon -- Pharyngeal -- Pharyngeal- Thin Cup Penetration/Aspiration during swallow;Reduced tongue base retraction;Reduced laryngeal elevation;Pharyngeal residue - valleculae;Pharyngeal residue - pyriform Pharyngeal Material does not enter airway Pharyngeal- Thin Straw Penetration/Aspiration before swallow;Penetration/Aspiration during swallow;Pharyngeal residue - pyriform;Pharyngeal residue - valleculae;Reduced epiglottic inversion;Reduced tongue base retraction;Reduced laryngeal elevation Pharyngeal Material enters airway, remains ABOVE  vocal cords and not ejected out Pharyngeal- Puree Delayed swallow initiation-vallecula;Reduced tongue base retraction;Reduced laryngeal elevation;Pharyngeal residue - valleculae;Pharyngeal residue - pyriform Pharyngeal -- Pharyngeal- Mechanical Soft Pharyngeal residue -  valleculae;Pharyngeal residue - pyriform;Reduced laryngeal elevation;Reduced tongue base retraction Pharyngeal -- Pharyngeal- Regular -- Pharyngeal -- Pharyngeal- Multi-consistency -- Pharyngeal -- Pharyngeal- Pill Pharyngeal residue - valleculae;Pharyngeal residue - pyriform;Reduced laryngeal elevation;Reduced tongue base retraction Pharyngeal -- Pharyngeal Comment --  CHL IP CERVICAL ESOPHAGEAL PHASE 11/30/2019 Cervical Esophageal Phase WFL Pudding Teaspoon -- Pudding Cup -- Honey Teaspoon -- Honey Cup -- Nectar Teaspoon -- Nectar Cup -- Nectar Straw -- Thin Teaspoon -- Thin Cup -- Thin Straw -- Puree -- Mechanical Soft -- Regular -- Multi-consistency -- Pill -- Cervical Esophageal Comment -- Chelsea E Hartness MA, CCC-SLP 11/30/2019, 3:05 PM              OCT, Retina - OU - Both Eyes  Result Date: 11/21/2019 Right Eye Quality was good. Central Foveal Thickness: 208. Progression has no prior data. Findings include normal foveal contour, no IRF, no SRF (Trace ERM, ? Diffuse retinal thinning, mild decreae in ellipsoid signal nasal perifoveal region). Left Eye Central Foveal Thickness: 212. Progression has no prior data. Findings include normal foveal contour, no IRF, no SRF (Mild decrease in perifoveal ellipsoid signal). Notes *Images captured and stored on drive Diagnosis / Impression: NFP, no IRF/SRF OU OD: Trace ERM, ? Diffuse retinal thinning, mild decrease in ellipsoid signal nasal perifoveal region OS: Mild decrease in perifoveal ellipsoid signal Clinical management: See below Abbreviations: NFP - Normal foveal profile. CME - cystoid macular edema. PED - pigment epithelial detachment. IRF - intraretinal fluid. SRF - subretinal fluid. EZ - ellipsoid zone. ERM - epiretinal membrane. ORA - outer retinal atrophy. ORT - outer retinal tubulation. SRHM - subretinal hyper-reflective material. IRHM - intraretinal hyper-reflective material   VAS [place] LOWER EXTREMITY VENOUS (DVT)  Result Date: 12/01/2019  Lower  Venous DVTStudy Indications: Swelling, and tenderness.  Comparison Study: No prior studies. Performing Technologist: Jean Rosenthal  Examination Guidelines: A complete evaluation includes B-mode imaging, spectral Doppler, color Doppler, and power Doppler as needed of all accessible portions of each vessel. Bilateral testing is considered an integral part of a complete examination. Limited examinations for reoccurring indications may be performed as noted. The reflux portion of the exam is performed with the patient in reverse Trendelenburg.  +---------+---------------+---------+-----------+----------+--------------+ RIGHT    CompressibilityPhasicitySpontaneityPropertiesThrombus Aging +---------+---------------+---------+-----------+----------+--------------+ CFV      Full           Yes      Yes                                 +---------+---------------+---------+-----------+----------+--------------+ SFJ      Full                                                        +---------+---------------+---------+-----------+----------+--------------+ FV Prox  Full                                                        +---------+---------------+---------+-----------+----------+--------------+ FV Mid   Full                                                        +---------+---------------+---------+-----------+----------+--------------+  FV DistalFull                                                        +---------+---------------+---------+-----------+----------+--------------+ PFV      Full                                                        +---------+---------------+---------+-----------+----------+--------------+ POP      Full           Yes      Yes                                 +---------+---------------+---------+-----------+----------+--------------+ PTV      Full                                                         +---------+---------------+---------+-----------+----------+--------------+ PERO     Full                                                        +---------+---------------+---------+-----------+----------+--------------+   +---------+---------------+---------+-----------+----------+--------------+ LEFT     CompressibilityPhasicitySpontaneityPropertiesThrombus Aging +---------+---------------+---------+-----------+----------+--------------+ CFV      Full           Yes      Yes                                 +---------+---------------+---------+-----------+----------+--------------+ SFJ      Full                                                        +---------+---------------+---------+-----------+----------+--------------+ FV Prox  Full                                                        +---------+---------------+---------+-----------+----------+--------------+ FV Mid   Full                                                        +---------+---------------+---------+-----------+----------+--------------+ FV DistalFull                                                        +---------+---------------+---------+-----------+----------+--------------+  PFV      Full                                                        +---------+---------------+---------+-----------+----------+--------------+ POP      Full           Yes      Yes                                 +---------+---------------+---------+-----------+----------+--------------+ PTV      Full                                                        +---------+---------------+---------+-----------+----------+--------------+ PERO     Full                                                        +---------+---------------+---------+-----------+----------+--------------+     Summary: RIGHT: - There is no evidence of deep vein thrombosis in the lower extremity.  - No cystic structure found in  the popliteal fossa.  LEFT: - There is no evidence of deep vein thrombosis in the lower extremity.  - No cystic structure found in the popliteal fossa.  *See table(s) above for measurements and observations. Electronically signed by Coral Else MD on 12/01/2019 at 8:53:30 PM.    Final    IR Paracentesis  Result Date: 11/26/2019 INDICATION: Patient with history of alcoholic cirrhosis, jaundice, ascites. Request received for diagnostic and therapeutic paracentesis. EXAM: ULTRASOUND GUIDED DIAGNOSTIC AND THERAPEUTIC PARACENTESIS MEDICATIONS: 1% lidocaine to skin and subcutaneous tissue COMPLICATIONS: None immediate. PROCEDURE: Informed written consent was obtained from the patient after a discussion of the risks, benefits and alternatives to treatment. A timeout was performed prior to the initiation of the procedure. Initial ultrasound scanning demonstrates a small amount of ascites within the right lower abdominal quadrant. The right lower abdomen was prepped and draped in the usual sterile fashion. 1% lidocaine was used for local anesthesia. Following this, a 19 gauge, 7-cm, Yueh catheter was introduced. An ultrasound image was saved for documentation purposes. The paracentesis was performed. The catheter was removed and a dressing was applied. The patient tolerated the procedure well without immediate post procedural complication. FINDINGS: A total of approximately 820 cc of clear, golden yellow fluid was removed. Samples were sent to the laboratory as requested by the clinical team. IMPRESSION: Successful ultrasound-guided diagnostic and therapeutic paracentesis yielding 820 cc of peritoneal fluid. Read by: Jeananne Rama, PA-C Electronically Signed   By: Gilmer Mor D.O.   On: 11/25/2019 16:10     Discharge Exam: Vitals:   12/04/19 0418 12/04/19 0833  BP: 108/75 106/71  Pulse: 70 (!) 115  Resp: 12 12  Temp: 98 F (36.7 C) (!) 97.5 F (36.4 C)  SpO2: 96% 97%   Vitals:   12/03/19 2344 12/04/19  0418 12/04/19 0422 12/04/19 0833  BP: 111/75 108/75  106/71  Pulse: 76 70  (!) 115  Resp: 11 12  12   Temp: 98.3 F (36.8 C) 98 F (36.7 C)  (!) 97.5 F (36.4 C)  TempSrc: Oral Oral  Oral  SpO2: 94% 96%  97%  Weight:   55 kg   Height:        General: Pt is alert, awake, not in acute distress, appearing icteric Cardiovascular: RRR, S1/S2 +, no rubs, no gallops Respiratory: CTA bilaterally, no wheezing, no rhonchi Abdominal: Soft, NT, very mildly distended, bowel sounds + Extremities: no edema, no cyanosis    The results of significant diagnostics from this hospitalization (including imaging, microbiology, ancillary and laboratory) are listed below for reference.     Microbiology: Recent Results (from the past 240 hour(s))  Respiratory Panel by RT PCR (Flu A&B, Covid) - Nasopharyngeal Swab     Status: None   Collection Time: 11/25/19  4:24 AM   Specimen: Nasopharyngeal Swab  Result Value Ref Range Status   SARS Coronavirus 2 by RT PCR NEGATIVE NEGATIVE Final    Comment: (NOTE) SARS-CoV-2 target nucleic acids are NOT DETECTED.  The SARS-CoV-2 RNA is generally detectable in upper respiratoy specimens during the acute phase of infection. The lowest concentration of SARS-CoV-2 viral copies this assay can detect is 131 copies/mL. A negative result does not preclude SARS-Cov-2 infection and should not be used as the sole basis for treatment or other patient management decisions. A negative result may occur with  improper specimen collection/handling, submission of specimen other than nasopharyngeal swab, presence of viral mutation(s) within the areas targeted by this assay, and inadequate number of viral copies (<131 copies/mL). A negative result must be combined with clinical observations, patient history, and epidemiological information. The expected result is Negative.  Fact Sheet for Patients:  https://www.moore.com/  Fact Sheet for Healthcare  Providers:  https://www.young.biz/  This test is no t yet approved or cleared by the [place] FDA and  has been authorized for detection and/or diagnosis of SARS-CoV-2 by FDA under an Emergency Use Authorization (EUA). This EUA will remain  in effect (meaning this test can be used) for the duration of the COVID-19 declaration under Section 564(b)(1) of the Act, 21 U.S.C. section 360bbb-3(b)(1), unless the authorization is terminated or revoked sooner.     Influenza A by PCR NEGATIVE NEGATIVE Final   Influenza B by PCR NEGATIVE NEGATIVE Final    Comment: (NOTE) The Xpert Xpress SARS-CoV-2/FLU/RSV assay is intended as an aid in  the diagnosis of influenza from Nasopharyngeal swab specimens and  should not be used as a sole basis for treatment. Nasal washings and  aspirates are unacceptable for Xpert Xpress SARS-CoV-2/FLU/RSV  testing.  Fact Sheet for Patients: https://www.moore.com/  Fact Sheet for Healthcare Providers: https://www.young.biz/  This test is not yet approved or cleared by the [place] FDA and  has been authorized for detection and/or diagnosis of SARS-CoV-2 by  FDA under an Emergency Use Authorization (EUA). This EUA will remain  in effect (meaning this test can be used) for the duration of the  Covid-19 declaration under Section 564(b)(1) of the Act, 21  U.S.C. section 360bbb-3(b)(1), unless the authorization is  terminated or revoked. Performed at [facility], [address] [place], [place] [address]   Gram stain     Status: None   Collection Time: 11/25/19  4:17 PM   Specimen: Abdomen; Peritoneal Fluid  Result Value Ref Range Status   Specimen Description FLUID PERITONEAL ABDOMEN  Final   Special Requests NONE  Final   Gram  Stain   Final    WBC PRESENT, PREDOMINANTLY MONONUCLEAR RED BLOOD CELLS PRESENT NO ORGANISMS SEEN CYTOSPIN SMEAR Performed at [facility], [address] [place], [place] [address]    Report Status 11/25/2019 FINAL  Final  Culture, body fluid-bottle     Status: None   Collection Time: 11/25/19  4:17 PM   Specimen: Fluid  Result Value Ref Range Status   Specimen Description FLUID PERITONEAL ABDOMEN  Final   Special Requests BOTTLES DRAWN AEROBIC AND ANAEROBIC  Final   Culture   Final    NO GROWTH 5 DAYS Performed at [facility], [address], [place], [place] [address]    Report Status 11/30/2019 FINAL  Final     Labs: BNP (last 3 results) No results for input(s): BNP in the last 8760 hours. Basic Metabolic Panel: Recent Labs  Lab 11/30/19 0441 11/30/19 1755 12/01/19 0414 12/02/19 0352 12/03/19 0240 12/04/19 0357  NA 139  --  137 136 133* 130*  K 3.7  --  3.8 4.1 3.6 3.6  CL 102  --  100 99 98 95*  CO2 29  --  GLUCOSE 116*  --  107* 125* 144* 166*  BUN 10  --  CREATININE 0.70  --  0.74 0.77 0.80 0.74  CALCIUM 7.8*  --  7.8* 7.7* 7.3* 7.7*  PHOS  --  5.5*  --   --   --   --    Liver Function Tests: Recent Labs  Lab 11/30/19 0441 12/01/19 0414 12/02/19 0352 12/03/19 0240 12/04/19 0357  AST 61* 56* 60* 50* 47*  ALT 38 41 45* 50* 54*  ALKPHOS 239* 219* 205* 208* 227*  BILITOT 16.3* 13.4* 13.8* 12.8* 11.9*  PROT 7.1 6.9 6.9 7.1 7.1  ALBUMIN 1.3* 1.3* 1.4* 1.4* 1.5*   No results for input(s): LIPASE, AMYLASE in the last 168 hours. Recent Labs  Lab 11/27/19 1437 11/28/19 0302 11/30/19 0441 12/01/19 1416 12/02/19 0352  AMMONIA 79* 82* 59* 50* 71*   CBC: Recent Labs  Lab 11/30/19 0441 12/01/19 0414 12/02/19 0352 12/03/19 0240 12/04/19 0357  WBC 25.7* 24.8* 28.1* 26.1* 28.5*  NEUTROABS  --   --   --  21.2* 23.9*  HGB 7.5* 7.4* 7.0* 8.9* 9.5*  HCT 21.8* 21.2*  19.2* 20.5* 25.7* 27.1*  MCV 119.8* 119.1* 120.6* 112.2* 111.5*  PLT 121* 120* 138* 121* 137*   Cardiac Enzymes: No results for input(s): CKTOTAL, CKMB, CKMBINDEX, TROPONINI in the last 168  hours. BNP: Invalid input(s): POCBNP CBG: No results for input(s): GLUCAP in the last 168 hours. D-Dimer No results for input(s): DDIMER in the last 72 hours. Hgb A1c No results for input(s): HGBA1C in the last 72 hours. Lipid Profile No results for input(s): CHOL, HDL, LDLCALC, TRIG, CHOLHDL, LDLDIRECT in the last 72 hours. Thyroid function studies No results for input(s): TSH, T4TOTAL, T3FREE, THYROIDAB in the last 72 hours.  Invalid input(s): FREET3 Anemia work up Recent Labs    12/02/19 1132  VITAMINB12 2,392*   Urinalysis    Component Value Date/Time   COLORURINE YELLOW 11/27/2019 1640   APPEARANCEUR CLEAR 11/27/2019 1640   LABSPEC 1.010 11/27/2019 1640   PHURINE 7.0 11/27/2019 1640   GLUCOSEU NEGATIVE 11/27/2019 1640   HGBUR NEGATIVE 11/27/2019 1640   BILIRUBINUR LARGE (A) 11/27/2019 1640   KETONESUR NEGATIVE 11/27/2019 1640   PROTEINUR NEGATIVE 11/27/2019 1640   UROBILINOGEN 1.0 07/01/2014 2335  NITRITE NEGATIVE 11/27/2019 1640   LEUKOCYTESUR NEGATIVE 11/27/2019 1640   Sepsis Labs Invalid input(s): PROCALCITONIN,  WBC,  LACTICIDVEN Microbiology Recent Results (from the past 240 hour(s))  Respiratory Panel by RT PCR (Flu A&B, Covid) - Nasopharyngeal Swab     Status: None   Collection Time: 11/25/19  4:24 AM   Specimen: Nasopharyngeal Swab  Result Value Ref Range Status   SARS Coronavirus 2 by RT PCR NEGATIVE NEGATIVE Final    Comment: (NOTE) SARS-CoV-2 target nucleic acids are NOT DETECTED.  The SARS-CoV-2 RNA is generally detectable in upper respiratoy specimens during the acute phase of infection. The lowest concentration of SARS-CoV-2 viral copies this assay can detect is 131 copies/mL. A negative result does not preclude SARS-Cov-2 infection and should not be used as the sole basis for treatment or other patient management decisions. A negative result may occur with  improper specimen collection/handling, submission of specimen other than  nasopharyngeal swab, presence of viral mutation(s) within the areas targeted by this assay, and inadequate number of viral copies (<131 copies/mL). A negative result must be combined with clinical observations, patient history, and epidemiological information. The expected result is Negative.  Fact Sheet for Patients:  https://www.moore.com/  Fact Sheet for Healthcare Providers:  https://www.young.biz/  This test is no t yet approved or cleared by the [place] FDA and  has been authorized for detection and/or diagnosis of SARS-CoV-2 by FDA under an Emergency Use Authorization (EUA). This EUA will remain  in effect (meaning this test can be used) for the duration of the COVID-19 declaration under Section 564(b)(1) of the Act, 21 U.S.C. section 360bbb-3(b)(1), unless the authorization is terminated or revoked sooner.     Influenza A by PCR NEGATIVE NEGATIVE Final   Influenza B by PCR NEGATIVE NEGATIVE Final    Comment: (NOTE) The Xpert Xpress SARS-CoV-2/FLU/RSV assay is intended as an aid in  the diagnosis of influenza from Nasopharyngeal swab specimens and  should not be used as a sole basis for treatment. Nasal washings and  aspirates are unacceptable for Xpert Xpress SARS-CoV-2/FLU/RSV  testing.  Fact Sheet for Patients: https://www.moore.com/  Fact Sheet for Healthcare Providers: https://www.young.biz/  This test is not yet approved or cleared by the [place] FDA and  has been authorized for detection and/or diagnosis of SARS-CoV-2 by  FDA under an Emergency Use Authorization (EUA). This EUA will remain  in effect (meaning this test can be used) for the duration of the  Covid-19 declaration under Section 564(b)(1) of the Act, 21  U.S.C. section 360bbb-3(b)(1), unless the authorization is  terminated or revoked. Performed at [facility], [address] [place],  [place] [address]   Gram stain     Status: None   Collection Time: 11/25/19  4:17 PM   Specimen: Abdomen; Peritoneal Fluid  Result Value Ref Range Status   Specimen Description FLUID PERITONEAL ABDOMEN  Final   Special Requests NONE  Final   Gram Stain   Final    WBC PRESENT, PREDOMINANTLY MONONUCLEAR RED BLOOD CELLS PRESENT NO ORGANISMS SEEN CYTOSPIN SMEAR Performed at [facility], [address], [place], [place] [address]    Report Status 11/25/2019 FINAL  Final  Culture, body fluid-bottle     Status: None   Collection Time: 11/25/19  4:17 PM   Specimen: Fluid  Result Value Ref Range Status   Specimen Description FLUID PERITONEAL ABDOMEN  Final   Special Requests BOTTLES DRAWN AEROBIC AND ANAEROBIC  Final   Culture  Final    NO GROWTH 5 DAYS Performed at [facility], [address] [place], [place] [address]    Report Status 11/30/2019 FINAL  Final     Time coordinating discharge: Over 30 minutes  SIGNED:   Hughie Closs, MD  [facility] 12/04/2019, 10:41 AM  If 7PM-7AM, please contact night-coverage [email or web address]

## 2019-12-04 NOTE — Discharge Instructions (Signed)
Alcoholic Hepatitis  Alcoholic hepatitis is liver inflammation that is caused by drinking a lot of alcohol over a long period of time. This inflammation decreases the liver's ability to function normally. This condition requires you to stop drinking alcohol permanently to prevent further damage. What are the causes? Alcoholic hepatitis is caused by long-term (chronic) heavy alcohol use. The liver filters alcohol out of the bloodstream. When alcohol gets divided into small particles (broken down) in the liver, substances are produced that can damage liver cells. This causes destruction of liver cells and inflammation. What increases the risk? The following factors may make you more likely to develop this condition:  Regularly drinking large amounts of alcohol, especially in a short amount of time (binge drinking).  Drinking heavily for years.  Being male.  Being obese.  Having had a hepatitis infection in the past.  Having a liver problem that you were born with (genetic liver disease).  Having a lack (deficiency) of certain nutrients, such as folate or thiamine.  Having a parent or sibling who has alcoholic hepatitis. What are the signs or symptoms? Symptoms of this condition include:  Pain and swelling in the abdomen.  Loss of appetite.  Losing weight without trying.  Nausea and vomiting.  Diarrhea.  Fever.  Fatigue.  Yellowing of the skin and the whites of the eyes (jaundice).  Veins that you can see ("spider veins"), especially in the abdomen.  Bleeding easily, such as excessive bleeding from a minor cut.  Itching.  Trouble thinking clearly.  Memory problems.  Mood changes.  Confusion. How is this diagnosed? This condition may be diagnosed with:  A physical exam and a review of medical history.  Blood tests to check liver function.  Tests that create detailed images of the body. These may include: ? A liver ultrasound. ? CT scan. ? MRI.  A  liver biopsy. For this test, a small sample of liver tissue is removed and checked for signs of liver damage. How is this treated? The most important part of treatment is to stop drinking alcohol. If you are addicted to alcohol, your health care provider will help you make a plan to quit. This plan may involve:  Taking medicine to decrease unpleasant symptoms that are caused by stopping or decreasing alcohol use (withdrawal symptoms).  Entering a treatment program to help you stop drinking.  Joining a support group. Treatment for alcoholic hepatitis may also include:  Steroid medicines to reduce inflammation.  Nutritional therapy. Your health care provider or a diet and nutrition specialist (dietitian) may recommend: ? Eating a healthy diet. ? Eating specific foods that contain vitamins and minerals to help you maintain nutrient levels in your body. ? Taking vitamins and dietary supplements to make sure you maintain nutrient levels in your body.  Receiving a donated liver (liver transplant). This is only done in very severe cases, and only for people who have completely stopped drinking and can commit to never drinking alcohol again. Follow these instructions at home:   Do not drink alcohol. Follow your treatment plan, and work with your health care provider as needed.  Consider joining an alcohol support group. These groups can provide emotional support and guidance.  Take over-the-counter and prescription medicines only as told by your health care provider. These include vitamins and supplements.  Do not use medicines or eat foods that contain alcohol unless told by your health care provider.  Follow instructions from your health care provider or dietitian about nutritional therapy.    Keep all follow-up visits as told by your health care provider. This is important. Contact a health care provider if:  You have a fever.  You have a decreased appetite.  You have flu-like  symptoms such as fatigue, weakness, or muscle aches.  You have nausea or vomiting.  You bruise easily.  Your urine is very dark.  You develop new pain in your abdomen. Get help right away if:  You vomit blood.  You develop jaundice.  You have severely itchy skin.  Your legs swell.  Your abdomen suddenly swells.  You have stools that are black, tar-like, or bloody.  You bleed easily, such as excessive bleeding from a minor cut.  You are confused or not thinking clearly.  You have a seizure. Summary  Alcoholic hepatitis is liver inflammation that is caused by drinking a lot of alcohol over a long period of time.  Alcoholic hepatitis is diagnosed with blood tests that check liver function.  The most important part of treatment is to stop drinking alcohol. Follow your treatment plan, and work with your health care provider as needed. This information is not intended to replace advice given to you by your health care provider. Make sure you discuss any questions you have with your health care provider. Document Revised: 05/12/2018 Document Reviewed: 10/03/2016 Elsevier Patient Education  2020 Elsevier Inc.  

## 2019-12-07 ENCOUNTER — Other Ambulatory Visit (INDEPENDENT_AMBULATORY_CARE_PROVIDER_SITE_OTHER): Payer: Commercial Managed Care - PPO

## 2019-12-07 ENCOUNTER — Telehealth: Payer: Self-pay | Admitting: Gastroenterology

## 2019-12-07 ENCOUNTER — Other Ambulatory Visit: Payer: Self-pay

## 2019-12-07 DIAGNOSIS — K7011 Alcoholic hepatitis with ascites: Secondary | ICD-10-CM

## 2019-12-07 LAB — CBC
HCT: 32.9 % — ABNORMAL LOW (ref 39.0–52.0)
Hemoglobin: 11.4 g/dL — ABNORMAL LOW (ref 13.0–17.0)
MCHC: 34.7 g/dL (ref 30.0–36.0)
MCV: 114 fl — ABNORMAL HIGH (ref 78.0–100.0)
Platelets: 167 10*3/uL (ref 150.0–400.0)
RBC: 2.89 Mil/uL — ABNORMAL LOW (ref 4.22–5.81)
RDW: 18.7 % — ABNORMAL HIGH (ref 11.5–15.5)
WBC: 28.7 10*3/uL (ref 4.0–10.5)

## 2019-12-07 LAB — COMPREHENSIVE METABOLIC PANEL
ALT: 84 U/L — ABNORMAL HIGH (ref 0–53)
AST: 63 U/L — ABNORMAL HIGH (ref 0–37)
Albumin: 2.6 g/dL — ABNORMAL LOW (ref 3.5–5.2)
Alkaline Phosphatase: 289 U/L — ABNORMAL HIGH (ref 39–117)
BUN: 13 mg/dL (ref 6–23)
CO2: 25 mEq/L (ref 19–32)
Calcium: 7.8 mg/dL — ABNORMAL LOW (ref 8.4–10.5)
Chloride: 91 mEq/L — ABNORMAL LOW (ref 96–112)
Creatinine, Ser: 0.99 mg/dL (ref 0.40–1.50)
GFR: 96.54 mL/min (ref >60.00)
Glucose, Bld: 287 mg/dL — ABNORMAL HIGH (ref 70–99)
Potassium: 4.2 mEq/L (ref 3.5–5.1)
Sodium: 126 mEq/L — ABNORMAL LOW (ref 135–145)
Total Bilirubin: 16.2 mg/dL — ABNORMAL HIGH (ref 0.2–1.2)
Total Protein: 7.7 g/dL (ref 6.0–8.3)

## 2019-12-07 LAB — PROTIME-INR
INR: 2 ratio — ABNORMAL HIGH (ref 0.8–1.0)
Prothrombin Time: 22.5 s — ABNORMAL HIGH (ref 9.6–13.1)

## 2019-12-07 LAB — AMMONIA: Ammonia: 27 umol/L (ref 11–35)

## 2019-12-07 MED ORDER — LACTULOSE 10 GM/15ML PO SOLN: 30.0000 g | Freq: Every day | ORAL | 0 refills | Status: DC

## 2019-12-07 NOTE — Telephone Encounter (Signed)
Spoke with patient's wife in regards to further recommendations. She states that they will follow up with PCP in regards to medication for sleep, She states that they will go by the lab today. Advised that if she notices patient is getting low on Prednisolone to give [place] a call. Answered all of her questions, she had no concerns at the end of the call.

## 2019-12-07 NOTE — Telephone Encounter (Signed)
Patient has been scheduled for a follow up with Dr. Myrtie Neither on 12/15/19 at 8:20 AM.

## 2019-12-07 NOTE — Telephone Encounter (Signed)
Wife is also aware that patient will need repeat labs this week

## 2019-12-07 NOTE — Telephone Encounter (Signed)
See alternate phone note for more information 

## 2019-12-07 NOTE — Telephone Encounter (Signed)
Lm on wife's vm for her to return call in regards to Dr. Myrtie Neither' recommendations.

## 2019-12-07 NOTE — Telephone Encounter (Signed)
Spoke with patient's wife to discuss the recommendations per Dr. Myrtie Neither'. She she states that patient has already reduced lactulose to 45 mls daily because he was having more than 2-3 BMs a day. She states that he will need a refill - will send this to pharmacy to reflect 45 mls daily. Wife is aware that Burman [employer]smith is not needed at this time.  Advised of Prednisolone taper and discussed calculations as medication is in liquid form. Wife states that she is not sure patient has enough of Prednisolone for taper - called pharmacy to confirm the amount dispensed - pharmacy staff states that only 390 ml was given due to insurancer, pt should have enough to complete taper.   Prescription refill for lactulose sent to pharmacy.  Wife states that patient will be able to make appt on 12/15/19 at 8:20 PM.   Wife would like to know what patient can take to sleep? Advised that he could try melatonin OTC and she states that there are several medications that he can't take with his condition and that they have tried Melatonin before and it did not help. Please advise, thank you.

## 2019-12-07 NOTE — Telephone Encounter (Signed)
Agree with all, including trial of melatonin.  If still having trouble sleeping, please discuss with primary care.

## 2019-12-07 NOTE — Telephone Encounter (Signed)
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  There were some medicine issues at the time of discharge addressed by Dr. Leone Payor.  Best thing would be to call patient's wife with plan:  I believe labs were arranged for this week.  If not, then needs CMP, CBC and INR  Prednisolone 30 mg daily for 5 days, then 20 mg daily for 5 days, then 10 mg daily for 5 days, then stop  Remain on lactulose current dose if having 2-3 BM's per day.  If more than that, cut dose by half No rifaxmin needed now - cost is prohibitive and lactulose should be sufficient to treat elevated ammonia.  Needs clinic visit with me and slots limited.  There is currently an 820 slot on Nov 10th.  Encourage them to come for that, 8am arrival  - HD

## 2019-12-10 ENCOUNTER — Encounter: Payer: Self-pay | Admitting: Sports Medicine

## 2019-12-10 ENCOUNTER — Other Ambulatory Visit: Payer: Self-pay | Admitting: Sports Medicine

## 2019-12-10 ENCOUNTER — Ambulatory Visit (INDEPENDENT_AMBULATORY_CARE_PROVIDER_SITE_OTHER): Payer: Commercial Managed Care - PPO | Admitting: Sports Medicine

## 2019-12-10 DIAGNOSIS — F419 Anxiety disorder, unspecified: Secondary | ICD-10-CM

## 2019-12-10 DIAGNOSIS — K7030 Alcoholic cirrhosis of liver without ascites: Secondary | ICD-10-CM | POA: Diagnosis not present

## 2019-12-10 DIAGNOSIS — F32A Depression, unspecified: Secondary | ICD-10-CM

## 2019-12-10 MED ORDER — HYDROXYZINE HCL 25 MG PO TABS: 25.0000 mg | ORAL_TABLET | Freq: Every day | ORAL | 3 refills | Status: DC

## 2019-12-10 MED ORDER — MIRTAZAPINE 15 MG PO TABS: 15.0000 mg | ORAL_TABLET | Freq: Every day | ORAL | 3 refills | Status: DC

## 2019-12-10 NOTE — Progress Notes (Signed)
    Procedures performed today:    None.  Independent interpretation of notes and tests performed by another provider:   None.  Brief History, Exam, Impression, and Recommendations:    Anxiety and depression Zachary Mata has had a very difficult hospitalization for liver failure, cirrhosis, ascites, he is not surprisingly depressed, having difficulty sleeping. There certainly Axis III factors that were care. Adding mirtazapine as an antidepressant that will also help him gain some weight. Hydroxyzine to help him sleep. Behavioral therapy. We may certainly try Lexapro or Celexa if insufficient improvement after the next several months.  Alcoholic cirrhosis of liver without ascites (HCC) Zachary Mata just had a fairly difficult hospitalization for alcoholic cirrhosis of the liver, hyperbilirubinemia, likely portal hypertension with GI bleed and anemia. No ascites today, he does have a hard palpable liver approximately 6 cm below the costal margin. He is comanaged with gastroenterology. He assures me that his alcohol use has ended, I did recommend disulfiram, he and his wife will talk about this together. There were some electrolyte and other metabolic abnormalities, rechecking labs today. We will continue spironolactone, Xifaxan, lactulose, furosemide.    ___________________________________________ Zachary Mata. Zachary Mata, M.D., ABFM., CAQSM. Primary Care and Sports Medicine [facility]  Adjunct Instructor of Family Medicine  [employer]

## 2019-12-10 NOTE — Assessment & Plan Note (Signed)
Zachary Mata has had a very difficult hospitalization for liver failure, cirrhosis, ascites, he is not surprisingly depressed, having difficulty sleeping. There certainly Axis III factors that were care. Adding mirtazapine as an antidepressant that will also help him gain some weight. Hydroxyzine to help him sleep. Behavioral therapy. We may certainly try Lexapro or Celexa if insufficient improvement after the next several months.

## 2019-12-10 NOTE — Assessment & Plan Note (Addendum)
[name] just had a fairly difficult hospitalization for alcoholic cirrhosis of the liver, hyperbilirubinemia, likely portal hypertension with GI bleed and anemia. No ascites today, he does have a hard palpable liver approximately 6 cm below the costal margin. He is comanaged with gastroenterology. He assures me that his alcohol use has ended, I did recommend disulfiram, he and his wife will talk about this together. There were some electrolyte and other metabolic abnormalities, rechecking labs today. We will continue spironolactone, Xifaxan, lactulose, furosemide.

## 2019-12-13 LAB — CBC WITH DIFFERENTIAL/PLATELET
Absolute Monocytes: 1524 cells/uL — ABNORMAL HIGH (ref 200–950)
Basophils Absolute: 51 cells/uL (ref 0–200)
Basophils Relative: 0.2 %
Eosinophils Absolute: 254 cells/uL (ref 15–500)
Eosinophils Relative: 1 %
HCT: 31.5 % — ABNORMAL LOW (ref 38.5–50.0)
Hemoglobin: 11 g/dL — ABNORMAL LOW (ref 13.2–17.1)
Lymphs Abs: 737 cells/uL — ABNORMAL LOW (ref 850–3900)
MCH: 36.7 pg — ABNORMAL HIGH (ref 27.0–33.0)
MCHC: 34.9 g/dL (ref 32.0–36.0)
MCV: 105 fL — ABNORMAL HIGH (ref 80.0–100.0)
MPV: 9.8 fL (ref 7.5–12.5)
Monocytes Relative: 6 %
Neutro Abs: 22835 cells/uL — ABNORMAL HIGH (ref 1500–7800)
Neutrophils Relative %: 89.9 %
Platelets: 122 10*3/uL — ABNORMAL LOW (ref 140–400)
RBC: 3 10*6/uL — ABNORMAL LOW (ref 4.20–5.80)
RDW: 15.5 % — ABNORMAL HIGH (ref 11.0–15.0)
Total Lymphocyte: 2.9 %
WBC: 25.4 10*3/uL — ABNORMAL HIGH (ref 3.8–10.8)

## 2019-12-13 LAB — COMPREHENSIVE METABOLIC PANEL
AG Ratio: 0.6 (calc) — ABNORMAL LOW (ref 1.0–2.5)
ALT: 93 U/L — ABNORMAL HIGH (ref 9–46)
AST: 66 U/L — ABNORMAL HIGH (ref 10–40)
Albumin: 2.9 g/dL — ABNORMAL LOW (ref 3.6–5.1)
Alkaline phosphatase (APISO): 324 U/L — ABNORMAL HIGH (ref 36–130)
BUN: 18 mg/dL (ref 7–25)
CO2: 25 mmol/L (ref 20–32)
Calcium: 8.1 mg/dL — ABNORMAL LOW (ref 8.6–10.3)
Chloride: 88 mmol/L — ABNORMAL LOW (ref 98–110)
Creat: 0.88 mg/dL (ref 0.60–1.35)
Globulin: 4.7 g/dL (calc) — ABNORMAL HIGH (ref 1.9–3.7)
Glucose, Bld: 132 mg/dL (ref 65–139)
Potassium: 4.4 mmol/L (ref 3.5–5.3)
Sodium: 126 mmol/L — ABNORMAL LOW (ref 135–146)
Total Bilirubin: 17.4 mg/dL — ABNORMAL HIGH (ref 0.2–1.2)
Total Protein: 7.6 g/dL (ref 6.1–8.1)

## 2019-12-13 LAB — AMMONIA: Ammonia: 118 umol/L — ABNORMAL HIGH (ref <=72)

## 2019-12-13 LAB — HEMOGLOBIN A1C
Hgb A1c MFr Bld: 4 % of total Hgb (ref <5.7)
Mean Plasma Glucose: 68 (calc)
eAG (mmol/L): 3.8 (calc)

## 2019-12-13 LAB — PROTIME-INR
INR: 1.5 — ABNORMAL HIGH
Prothrombin Time: 16 s — ABNORMAL HIGH (ref 9.0–11.5)

## 2019-12-13 LAB — APTT: aPTT: 32 s (ref 23–32)

## 2019-12-14 ENCOUNTER — Encounter (INDEPENDENT_AMBULATORY_CARE_PROVIDER_SITE_OTHER): Payer: No Typology Code available for payment source | Admitting: Ophthalmology

## 2019-12-14 DIAGNOSIS — I10 Essential (primary) hypertension: Secondary | ICD-10-CM

## 2019-12-14 DIAGNOSIS — H469 Unspecified optic neuritis: Secondary | ICD-10-CM

## 2019-12-14 DIAGNOSIS — H5360 Unspecified night blindness: Secondary | ICD-10-CM

## 2019-12-14 DIAGNOSIS — H25813 Combined forms of age-related cataract, bilateral: Secondary | ICD-10-CM

## 2019-12-14 DIAGNOSIS — H35033 Hypertensive retinopathy, bilateral: Secondary | ICD-10-CM

## 2019-12-14 DIAGNOSIS — S0285XS Fracture of orbit, unspecified, sequela: Secondary | ICD-10-CM

## 2019-12-14 DIAGNOSIS — H3589 Other specified retinal disorders: Secondary | ICD-10-CM

## 2019-12-14 DIAGNOSIS — H3581 Retinal edema: Secondary | ICD-10-CM

## 2019-12-15 ENCOUNTER — Ambulatory Visit: Payer: Commercial Managed Care - PPO | Admitting: Gastroenterology

## 2019-12-15 ENCOUNTER — Encounter: Payer: Self-pay | Admitting: Gastroenterology

## 2019-12-15 VITALS — BP 110/72 | HR 102 | Ht 66.0 in | Wt 122.0 lb

## 2019-12-15 DIAGNOSIS — K759 Inflammatory liver disease, unspecified: Secondary | ICD-10-CM

## 2019-12-15 DIAGNOSIS — K7290 Hepatic failure, unspecified without coma: Secondary | ICD-10-CM

## 2019-12-15 DIAGNOSIS — K7682 Hepatic encephalopathy: Secondary | ICD-10-CM

## 2019-12-15 DIAGNOSIS — K7011 Alcoholic hepatitis with ascites: Secondary | ICD-10-CM | POA: Diagnosis not present

## 2019-12-15 NOTE — Progress Notes (Signed)
GI Progress Note  Chief Complaint: Cholestatic jaundice from acute alcoholic hepatitis  Subjective  History: [name] follows up after recent hospital stay, when he was admitted with marked jaundice from severe alcohol-related hepatitis on top of suspected cirrhosis. He briefly underwent alcohol withdrawal, had a period of hepatic encephalopathy improved with lactulose and rifaximin. He was started on prednisolone at the time of admission, and continued after discharge with instructions for tapering dose. He had large volume ascites that required 1 paracentesis, no SBP found. He was discharged on furosemide and spironolactone as well as lactulose and rifaximin. Rifaximin was prohibitively expensive, and the prednisolone had to be changed to liquid form, also for cost reasons. Follow-up labs were done as noted below, he had persistent leukocytosis and elevated bilirubin fluctuating levels as well as hyponatremia.  He was also found to have a mildly elevated actin antibody of 22 and his Tylenol level was positive at 19 upon admission.  He did not receive NAC.  He also had an actin level of 22 in August 2019. ______________  [name] was here with his wife today.  He has generally improved since hospital discharge, has been abstinent from alcohol and taking medications as prescribed.  There was sleep difficulty that has improved with primary care prescribed Atarax and Remeron.  His appetite has been good and he has put on some weight.  There is bloating and gas from the lactulose that he treats with Gas-X.  Typically has 2-4 BMs per day that are soft and without blood taking lactulose 45 mL every morning.  They found that twice a day caused the BMs to be too frequent. He has been having some low back pain as well as some cramping in his hands, and they had forgotten to mention this to primary care recently.  He had gotten several tramadol tablets at the time of hospital discharge but did not  find them helpful. He has not been back to work, but his wife feels that his mobility is improved, he does not need a walker anymore, and she is hopeful to return to work next week. Lastly, he has a fine bumpy rash on the chest and back.  ROS: Cardiovascular:  no chest pain Respiratory: no dyspnea Generalized fatigue Remainder systems negative except as above  The patient's Past Medical, Family and Social History were reviewed and are on file in the EMR.  Objective:  Med list reviewed  Current Outpatient Medications:  .  furosemide (LASIX) 40 MG tablet, Take 1 tablet (40 mg total) by mouth daily., Disp: 30 tablet, Rfl: 0 .  gabapentin (NEURONTIN) 100 MG capsule, Take 1 capsule (100 mg total) by mouth 2 (two) times daily., Disp: 60 capsule, Rfl: 0 .  hydrOXYzine (ATARAX/VISTARIL) 25 MG tablet, Take 1 tablet (25 mg total) by mouth at bedtime., Disp: 30 tablet, Rfl: 3 .  lactulose (CHRONULAC) 10 GM/15ML solution, Take 45 mLs (30 g total) by mouth daily., Disp: 473 mL, Rfl: 0 .  mirtazapine (REMERON) 15 MG tablet, Take 1 tablet (15 mg total) by mouth at bedtime., Disp: 30 tablet, Rfl: 3 .  pantoprazole (PROTONIX) 40 MG tablet, Take 1 tablet (40 mg total) by mouth daily., Disp: 30 tablet, Rfl: 0 .  prednisoLONE (PRELONE) 15 MG/5ML SOLN, 13 ml daily (approximately 40 mg), Disp: 480 mL, Rfl: 0 .  rifaximin (XIFAXAN) 550 MG TABS tablet, Take 1 tablet (550 mg total) by mouth 2 (two) times daily., Disp: 60 tablet, Rfl: 0 .  spironolactone (ALDACTONE) 100 MG tablet, Take 1 tablet (100 mg total) by mouth daily., Disp: 30 tablet, Rfl: 0  (He is not on rifaximin due to the cost)   Vital signs in last 24 hrs: Vitals:   12/15/19 0821  BP: 110/72  Pulse: (!) 102    Physical Exam  Thin with poor muscle mass as before.  He is alert and conversational, sitting in a chair and gets on exam table without difficulty.  His speech is fluent and mentation clear. Normal gross motor function, good  balance, no asterixis.  HEENT: sclera deeply icteric, oral mucosa moist without lesions  Neck: supple, no thyromegaly, JVD or lymphadenopathy  Cardiac: RRR without murmurs, S1S2 heard, no peripheral edema  Pulm: clear to auscultation bilaterally, normal RR and effort noted  Abdomen: soft, no bulging flanks or distention.  No tenderness.  Left lobe liver enlarged on inspiration, no spleen tip palpable.  Skin; warm and dry, deeply jaundiced.  He has a diffuse slightly erythematous skin eruption chest and back, fine bumps, no excoriations.  Multiple tattoos  Labs:  CMP Latest Ref Rng & Units 12/10/2019 12/07/2019 12/04/2019  Glucose 65 - 139 mg/dL 160 737(T) 062(I)  BUN 7 - 25 mg/dL 18 13 10   Creatinine 0.60 - 1.35 mg/dL 9.48 5.46  Sodium 135 - 146 mmol/L 126(L) 126(L) 130(L)  Potassium 3.5 - 5.3 mmol/L 4.4 4.2 3.6  Chloride 98 - 110 mmol/L 88(L) 91(L) 95(L)  CO2 20 - 32 mmol/L 25 25 24   Calcium 8.6 - 10.3 mg/dL 8.1(L) 7.8(L) 7.7(L)  Total Protein 6.1 - 8.1 g/dL 7.6 7.7 7.1  Total Bilirubin 0.2 - 1.2 mg/dL 17.4(H) 16.2(H) 11.9(H)  Alkaline Phos 39 - 117 U/L - 289(H) 227(H)  AST 10 - 40 U/L 66(H) 63(H) 47(H)  ALT 9 - 46 U/L 93(H) 84(H) 54(H)   Lab Results  Component Value Date   ALKPHOS 289 (H) 12/07/2019   Lab Results  Component Value Date   INR 1.5 (H) 12/10/2019   INR 2.0 (H) 12/07/2019   INR 2.0 (H) 12/02/2019     ___________________________________________ Radiologic studies:   ____________________________________________ Other:   _____________________________________________ Assessment & Plan  Assessment: Encounter Diagnoses  Name Primary?  . Jaundice due to hepatitis Yes  . Alcoholic hepatitis with ascites   . Encephalopathy, hepatic (HCC)   Hyponatremia  Alcohol-related hepatitis and probable underlying cirrhosis. Significance of the mildly elevated actin antibody unclear in the setting of this acute inflammation and recent alcohol use.  He may  have had an element of toxicity from concomitant Tylenol and alcohol use.  13/03/2019 is slowly improved overall.  Renal function stable, ascites under good control on sodium restriction and low-dose diuretics. I do not know the nature of this rash but suspect it is somehow related to this acute hepatitis. He is tapering off of prednisolone and should be done early to mid next week. He has certainly done well after discharge at home under the careful attention of his wife.  Plan: Continue current dose of lactulose and spironolactone and furosemide. Repeat CMP, CBC and INR early to mid next week. 2000 mL daily fluid intake (sounds like he has been consuming a large amount of fluid)  The modest transaminitis suggest this is not likely to be concomitant autoimmune hepatitis.  We will recheck the actin antibody after more time has passed to allow residual inflammation from the recent alcohol to improve.  I expect that we will likely take a couple more months.  If  that is still elevated and liver function does not improve as expected, liver biopsy may be necessary.  Follow-up in about 6 weeks, will get lab results next week and make final plan.  40 minutes were spent on this encounter (including chart review, history/exam, counseling/coordination of care, and documentation)  Zachary Mata

## 2019-12-15 NOTE — Patient Instructions (Signed)
If you are age 38 or older, your body mass index should be between 23-30. Your Body mass index is 19.69 kg/m. If this is out of the aforementioned range listed, please consider follow up with your Primary Care Provider.  If you are age 62 or younger, your body mass index should be between 19-25. Your Body mass index is 19.69 kg/m. If this is out of the aformentioned range listed, please consider follow up with your Primary Care Provider.   Your provider has requested that you go to the basement level for lab work early next week. Press "B" on the elevator. The lab is located at the first door on the left as you exit the elevator.  Due to recent changes in healthcare laws, you may see the results of your imaging and laboratory studies on MyChart before your provider has had a chance to review them.  We understand that in some cases there may be results that are confusing or concerning to you. Not all laboratory results come back in the same time frame and the provider may be waiting for multiple results in order to interpret others.  Please give [place] 48 hours in order for your provider to thoroughly review all the results before contacting the office for clarification of your results.   It was a pleasure to see you today!  Dr. Myrtie Neither

## 2019-12-16 ENCOUNTER — Telehealth: Payer: Self-pay | Admitting: Gastroenterology

## 2019-12-16 ENCOUNTER — Other Ambulatory Visit: Payer: Self-pay

## 2019-12-16 MED ORDER — LACTULOSE 10 GM/15ML PO SOLN: 30.0000 g | Freq: Every day | ORAL | 2 refills | Status: DC

## 2019-12-16 NOTE — Telephone Encounter (Signed)
Spoke with patient's wife, Zachary Mata, she is requesting a 30 day supply of lactulose be sent to pharmacy. Refilled to reflect 30 days with 2 refills. She had no other concerns.

## 2019-12-20 ENCOUNTER — Other Ambulatory Visit: Payer: Self-pay

## 2019-12-20 ENCOUNTER — Other Ambulatory Visit (INDEPENDENT_AMBULATORY_CARE_PROVIDER_SITE_OTHER): Payer: Commercial Managed Care - PPO

## 2019-12-20 ENCOUNTER — Emergency Department (HOSPITAL_COMMUNITY)
Admission: EM | Admit: 2019-12-20 | Discharge: 2019-12-20 | Disposition: A | Discharge status: Home / Self Care | Payer: Commercial Managed Care - PPO | Attending: Emergency Medicine | Admitting: Emergency Medicine

## 2019-12-20 ENCOUNTER — Telehealth: Payer: Self-pay

## 2019-12-20 ENCOUNTER — Encounter (HOSPITAL_COMMUNITY): Payer: Self-pay

## 2019-12-20 ENCOUNTER — Telehealth (HOSPITAL_BASED_OUTPATIENT_CLINIC_OR_DEPARTMENT_OTHER): Payer: Self-pay | Admitting: *Deleted

## 2019-12-20 DIAGNOSIS — R799 Abnormal finding of blood chemistry, unspecified: Secondary | ICD-10-CM | POA: Insufficient documentation

## 2019-12-20 DIAGNOSIS — K7011 Alcoholic hepatitis with ascites: Secondary | ICD-10-CM | POA: Diagnosis not present

## 2019-12-20 DIAGNOSIS — K759 Inflammatory liver disease, unspecified: Secondary | ICD-10-CM

## 2019-12-20 DIAGNOSIS — Z5321 Procedure and treatment not carried out due to patient leaving prior to being seen by health care provider: Secondary | ICD-10-CM | POA: Insufficient documentation

## 2019-12-20 DIAGNOSIS — K7682 Hepatic encephalopathy: Secondary | ICD-10-CM

## 2019-12-20 DIAGNOSIS — K7290 Hepatic failure, unspecified without coma: Secondary | ICD-10-CM | POA: Diagnosis not present

## 2019-12-20 LAB — CBC WITH DIFFERENTIAL/PLATELET
Abs Immature Granulocytes: 0.72 10*3/uL — ABNORMAL HIGH (ref 0.00–0.07)
Basophils Absolute: 0.1 10*3/uL (ref 0.0–0.1)
Basophils Absolute: 0.1 10*3/uL (ref 0.0–0.1)
Basophils Relative: 0.5 % (ref 0.0–3.0)
Basophils Relative: 0 %
Eosinophils Absolute: 0.4 10*3/uL (ref 0.0–0.7)
Eosinophils Absolute: 0.2 10*3/uL (ref 0.0–0.5)
Eosinophils Relative: 1.5 % (ref 0.0–5.0)
Eosinophils Relative: 1 %
HCT: 32.4 % — ABNORMAL LOW (ref 39.0–52.0)
HCT: 34.4 % — ABNORMAL LOW (ref 39.0–52.0)
Hemoglobin: 11.6 g/dL — ABNORMAL LOW (ref 13.0–17.0)
Hemoglobin: 12 g/dL — ABNORMAL LOW (ref 13.0–17.0)
Immature Granulocytes: 3 %
Lymphocytes Relative: 3.9 % — ABNORMAL LOW (ref 12.0–46.0)
Lymphocytes Relative: 6 %
Lymphs Abs: 0.9 10*3/uL (ref 0.7–4.0)
Lymphs Abs: 1.5 10*3/uL (ref 0.7–4.0)
MCH: 39 pg — ABNORMAL HIGH (ref 26.0–34.0)
MCHC: 35.7 g/dL (ref 30.0–36.0)
MCHC: 34.9 g/dL (ref 30.0–36.0)
MCV: 111.5 fl — ABNORMAL HIGH (ref 78.0–100.0)
MCV: 111.7 fL — ABNORMAL HIGH (ref 80.0–100.0)
Monocytes Absolute: 0.9 10*3/uL (ref 0.1–1.0)
Monocytes Absolute: 1.3 10*3/uL — ABNORMAL HIGH (ref 0.1–1.0)
Monocytes Relative: 3.9 % (ref 3.0–12.0)
Monocytes Relative: 6 %
Neutro Abs: 20.7 10*3/uL — ABNORMAL HIGH (ref 1.4–7.7)
Neutro Abs: 19.5 10*3/uL — ABNORMAL HIGH (ref 1.7–7.7)
Neutrophils Relative %: 90.2 % — ABNORMAL HIGH (ref 43.0–77.0)
Neutrophils Relative %: 84 %
Platelets: 67 10*3/uL — ABNORMAL LOW (ref 150.0–400.0)
Platelets: 73 10*3/uL — ABNORMAL LOW (ref 150–400)
RBC: 2.91 Mil/uL — ABNORMAL LOW (ref 4.22–5.81)
RBC: 3.08 MIL/uL — ABNORMAL LOW (ref 4.22–5.81)
RDW: 17 % — ABNORMAL HIGH (ref 11.5–15.5)
RDW: 15.2 % (ref 11.5–15.5)
WBC: 23 10*3/uL (ref 4.0–10.5)
WBC: 23.3 10*3/uL — ABNORMAL HIGH (ref 4.0–10.5)
nRBC: 0 % (ref 0.0–0.2)

## 2019-12-20 LAB — PROTIME-INR
INR: 2.1 ratio — ABNORMAL HIGH (ref 0.8–1.0)
Prothrombin Time: 22.9 s — ABNORMAL HIGH (ref 9.6–13.1)

## 2019-12-20 LAB — COMPREHENSIVE METABOLIC PANEL
ALT: 67 U/L — ABNORMAL HIGH (ref 0–53)
ALT: 81 U/L — ABNORMAL HIGH (ref 0–44)
AST: 43 U/L — ABNORMAL HIGH (ref 0–37)
AST: 50 U/L — ABNORMAL HIGH (ref 15–41)
Albumin: 2.8 g/dL — ABNORMAL LOW (ref 3.5–5.2)
Albumin: 2.8 g/dL — ABNORMAL LOW (ref 3.5–5.0)
Alkaline Phosphatase: 294 U/L — ABNORMAL HIGH (ref 39–117)
Alkaline Phosphatase: 293 U/L — ABNORMAL HIGH (ref 38–126)
Anion gap: 15 (ref 5–15)
BUN: 13 mg/dL (ref 6–23)
BUN: 14 mg/dL (ref 6–20)
CO2: 25 mEq/L (ref 19–32)
CO2: 22 mmol/L (ref 22–32)
Calcium: 8.4 mg/dL (ref 8.4–10.5)
Calcium: 8.6 mg/dL — ABNORMAL LOW (ref 8.9–10.3)
Chloride: 87 mEq/L — ABNORMAL LOW (ref 96–112)
Chloride: 87 mmol/L — ABNORMAL LOW (ref 98–111)
Creatinine, Ser: 0.97 mg/dL (ref 0.40–1.50)
Creatinine, Ser: 0.63 mg/dL (ref 0.61–1.24)
GFR, Estimated: >60 mL/min (ref >60)
GFR: 98.91 mL/min (ref >60.00)
Glucose, Bld: 213 mg/dL — ABNORMAL HIGH (ref 70–99)
Glucose, Bld: 138 mg/dL — ABNORMAL HIGH (ref 70–99)
Potassium: 4.2 mEq/L (ref 3.5–5.1)
Potassium: 4.2 mmol/L (ref 3.5–5.1)
Sodium: 122 mEq/L — ABNORMAL LOW (ref 135–145)
Sodium: 124 mmol/L — ABNORMAL LOW (ref 135–145)
Total Bilirubin: 13.3 mg/dL — ABNORMAL HIGH (ref 0.2–1.2)
Total Bilirubin: 13.6 mg/dL — ABNORMAL HIGH (ref 0.3–1.2)
Total Protein: 7 g/dL (ref 6.0–8.3)
Total Protein: 8.4 g/dL — ABNORMAL HIGH (ref 6.5–8.1)

## 2019-12-20 LAB — LACTIC ACID, PLASMA: Lactic Acid, Venous: 3.6 mmol/L (ref 0.5–1.9)

## 2019-12-20 NOTE — Telephone Encounter (Signed)
Dr. Myrtie Neither - this pt has a critical WBC of 23. Please see lab  Results from today

## 2019-12-20 NOTE — ED Triage Notes (Signed)
Pt arrived via walk in, states he had blood work done earlier today, was called and told it was abnormal. States he is currently under tx for liver failure.

## 2019-12-20 NOTE — Telephone Encounter (Signed)
Noted, await his arrival to ED.

## 2019-12-20 NOTE — Telephone Encounter (Signed)
[place],  This is a patient I saw during her recent hospital stay for severe alcohol-related hepatitis with jaundice, ascites and hepatic encephalopathy.  He had repeat CBC, CMP and INR today.  Lab called because of his persistent elevated WBC, which certainly remains elevated but is stable from 10 days prior.   However, there are things that concern me.  Although his bilirubin level is slowly coming down again, his decreasing platelet count and increased increased INR indicate his liver is under stress.  The other issue is the decreasing serum sodium level.  Though his kidney function lab remains stable, I am concerned that the decreasing sodium level indicates his kidneys may be under stress from the liver condition.  I had him start a daily fluid restriction last week, but it does not seem to be working.  Please call and speak with his wife.  With these results, I must recommend that she bring him to the [facility] emergency department for likely admission for observation and close monitoring as well as consultation with our service and the kidney doctors.   I have copied our inpatient service on this as well.  - HD

## 2019-12-20 NOTE — Telephone Encounter (Signed)
Spoke with patient's wife regarding Dr. Myrtie Neither' recommendations. She states that she will take patient to the ED after she gets off from work. She verbalized understanding and had no other concerns at the end of the call.

## 2019-12-20 NOTE — Telephone Encounter (Signed)
Called wife's mobile number twice, vm is full, unable to leave a voicemail.   Called and spoke with patient, he states that he will have his wife call me when she gets out of her meetings. Will await return call

## 2019-12-22 ENCOUNTER — Other Ambulatory Visit: Payer: Self-pay

## 2019-12-22 DIAGNOSIS — E871 Hypo-osmolality and hyponatremia: Secondary | ICD-10-CM

## 2019-12-22 DIAGNOSIS — K7290 Hepatic failure, unspecified without coma: Secondary | ICD-10-CM

## 2019-12-22 DIAGNOSIS — K7682 Hepatic encephalopathy: Secondary | ICD-10-CM

## 2019-12-22 DIAGNOSIS — K7011 Alcoholic hepatitis with ascites: Secondary | ICD-10-CM

## 2019-12-22 NOTE — Telephone Encounter (Signed)
Please see messages from 11/15 re: lab results and advice to be seen in ED.  Looks like patient went there but left without being seen.  Please find out from his wife what is happening.   He needs an ASAP referral to [facility] for his hyponatremia and repeat CBC, CMP and INR tomorrow.

## 2019-12-22 NOTE — Telephone Encounter (Signed)
Called wife twice, vm full, unable to leave a voicemail. Will attempt later.  Urgent referral faxed to [facility] along with records.   Lab order and reminder in epic.

## 2019-12-22 NOTE — Telephone Encounter (Signed)
Left voicemail for Aurther Loft (scheduler/referrals) at [facility], advised that I have faxed over an urgent referral for this patient and to give me a call if she had any questions.

## 2019-12-22 NOTE — Telephone Encounter (Signed)
Spoke with patient's wife, she stated that they did go to the ED but there were no rooms available, she states that they waited a couple of hours then decided to leave because they did not want to wait any longer. She is aware that patient needs to come in tomorrow for lab work and she is aware that [facility] will be in contact with her to schedule. She verbalized understanding of all instructions and had no other concerns.   Dr. Myrtie Neither, just an [facility]

## 2019-12-22 NOTE — Telephone Encounter (Signed)
Got it, thanks.  Appreciate your efforts on that.

## 2019-12-23 ENCOUNTER — Telehealth: Payer: Self-pay

## 2019-12-23 NOTE — Telephone Encounter (Signed)
-----   Message from Tarryn Bogdan N Lurena Naeve, RN sent at 12/22/2019  9:10 AM EST ----- Regarding: Labs Repeat labs today, order in epic  

## 2019-12-23 NOTE — Telephone Encounter (Signed)
-----   Message from Missy Sabins, RN sent at 12/22/2019  9:10 AM EST ----- Regarding: Labs Repeat labs today, order in epic

## 2019-12-23 NOTE — Telephone Encounter (Signed)
Spoke with patient's wife to remind her that patient needs to come in for labs today, she states that she told patient that he will need to come in today, tomorrow at the latest. She states that she still has not heard from [facility], advised that she can call them to expedite the process, provided her the phone number, pt wife had no other concerns at the end of the call.

## 2019-12-24 ENCOUNTER — Other Ambulatory Visit (INDEPENDENT_AMBULATORY_CARE_PROVIDER_SITE_OTHER): Payer: Commercial Managed Care - PPO

## 2019-12-24 ENCOUNTER — Telehealth: Payer: Self-pay | Admitting: Gastroenterology

## 2019-12-24 DIAGNOSIS — E871 Hypo-osmolality and hyponatremia: Secondary | ICD-10-CM | POA: Diagnosis not present

## 2019-12-24 DIAGNOSIS — K7011 Alcoholic hepatitis with ascites: Secondary | ICD-10-CM

## 2019-12-24 DIAGNOSIS — K7682 Hepatic encephalopathy: Secondary | ICD-10-CM

## 2019-12-24 DIAGNOSIS — K7290 Hepatic failure, unspecified without coma: Secondary | ICD-10-CM | POA: Diagnosis not present

## 2019-12-24 LAB — CBC WITH DIFFERENTIAL/PLATELET
Basophils Absolute: 0.1 10*3/uL (ref 0.0–0.1)
Basophils Relative: 0.4 % (ref 0.0–3.0)
Eosinophils Absolute: 0.7 10*3/uL (ref 0.0–0.7)
Eosinophils Relative: 3 % (ref 0.0–5.0)
HCT: 33.9 % — ABNORMAL LOW (ref 39.0–52.0)
Hemoglobin: 11.9 g/dL — ABNORMAL LOW (ref 13.0–17.0)
Lymphocytes Relative: 10.1 % — ABNORMAL LOW (ref 12.0–46.0)
Lymphs Abs: 2.4 10*3/uL (ref 0.7–4.0)
MCHC: 35.3 g/dL (ref 30.0–36.0)
MCV: 110.9 fl — ABNORMAL HIGH (ref 78.0–100.0)
Monocytes Absolute: 1.8 10*3/uL — ABNORMAL HIGH (ref 0.1–1.0)
Monocytes Relative: 7.6 % (ref 3.0–12.0)
Neutro Abs: 18.4 10*3/uL — ABNORMAL HIGH (ref 1.4–7.7)
Neutrophils Relative %: 78.9 % — ABNORMAL HIGH (ref 43.0–77.0)
Platelets: 84 10*3/uL — ABNORMAL LOW (ref 150.0–400.0)
RBC: 3.05 Mil/uL — ABNORMAL LOW (ref 4.22–5.81)
RDW: 15.8 % — ABNORMAL HIGH (ref 11.5–15.5)
WBC: 23.3 10*3/uL (ref 4.0–10.5)

## 2019-12-24 LAB — COMPREHENSIVE METABOLIC PANEL
ALT: 74 U/L — ABNORMAL HIGH (ref 0–53)
AST: 60 U/L — ABNORMAL HIGH (ref 0–37)
Albumin: 3 g/dL — ABNORMAL LOW (ref 3.5–5.2)
Alkaline Phosphatase: 301 U/L — ABNORMAL HIGH (ref 39–117)
BUN: 13 mg/dL (ref 6–23)
CO2: 24 mEq/L (ref 19–32)
Calcium: 8.4 mg/dL (ref 8.4–10.5)
Chloride: 89 mEq/L — ABNORMAL LOW (ref 96–112)
Creatinine, Ser: 1.5 mg/dL (ref 0.40–1.50)
GFR: 58.62 mL/min — ABNORMAL LOW (ref >60.00)
Glucose, Bld: 138 mg/dL — ABNORMAL HIGH (ref 70–99)
Potassium: 4.6 mEq/L (ref 3.5–5.1)
Sodium: 123 mEq/L — ABNORMAL LOW (ref 135–145)
Total Bilirubin: 12.4 mg/dL — ABNORMAL HIGH (ref 0.2–1.2)
Total Protein: 7.4 g/dL (ref 6.0–8.3)

## 2019-12-24 LAB — PROTIME-INR
INR: 1.9 ratio — ABNORMAL HIGH (ref 0.8–1.0)
Prothrombin Time: 20.6 s — ABNORMAL HIGH (ref 9.6–13.1)

## 2019-12-24 NOTE — Telephone Encounter (Signed)
I spoke to Zachary Mata's wife Zachary Mata about today's lab results. He has pain today, but otherwise is reportedly clinically stable.  No change in mental status, and is making urine per usual. WBC remains elevated, liver labs and INR elevated as well, all similar to several days ago.  However, his creatinine has increased, indicating his kidneys are stressed.    They had gone to the ED days ago when sodium level went down to 122, but left because of the wait.  They would like to avoid the ED and hospital again if possible.  I asked Zachary Mata to stop the spironolactone and the furosemide immediately, and to have [name] come to the lab Monday morning to check CBC/CMP/INR again. Please place those lab orders first thing Monday AM.  If his urine volume decreases over the weekend, call on-call MD and bring him to the ED.  We also discussed need for liver biopsy in the near future because his condition is just not improving.  That will be revisited after we see where the renal function is going.  - HD

## 2019-12-25 LAB — CULTURE, BLOOD (SINGLE)
Culture: NO GROWTH
Special Requests: ADEQUATE

## 2019-12-27 ENCOUNTER — Other Ambulatory Visit: Payer: Self-pay

## 2019-12-27 ENCOUNTER — Telehealth: Payer: Self-pay

## 2019-12-27 ENCOUNTER — Other Ambulatory Visit (INDEPENDENT_AMBULATORY_CARE_PROVIDER_SITE_OTHER): Payer: Commercial Managed Care - PPO

## 2019-12-27 DIAGNOSIS — K7011 Alcoholic hepatitis with ascites: Secondary | ICD-10-CM

## 2019-12-27 DIAGNOSIS — K7290 Hepatic failure, unspecified without coma: Secondary | ICD-10-CM

## 2019-12-27 DIAGNOSIS — E871 Hypo-osmolality and hyponatremia: Secondary | ICD-10-CM | POA: Diagnosis not present

## 2019-12-27 DIAGNOSIS — K7682 Hepatic encephalopathy: Secondary | ICD-10-CM

## 2019-12-27 LAB — COMPREHENSIVE METABOLIC PANEL
ALT: 56 U/L — ABNORMAL HIGH (ref 0–53)
AST: 50 U/L — ABNORMAL HIGH (ref 0–37)
Albumin: 2.8 g/dL — ABNORMAL LOW (ref 3.5–5.2)
Alkaline Phosphatase: 250 U/L — ABNORMAL HIGH (ref 39–117)
BUN: 9 mg/dL (ref 6–23)
CO2: 20 mEq/L (ref 19–32)
Calcium: 7.9 mg/dL — ABNORMAL LOW (ref 8.4–10.5)
Chloride: 92 mEq/L — ABNORMAL LOW (ref 96–112)
Creatinine, Ser: 0.82 mg/dL (ref 0.40–1.50)
GFR: 111.28 mL/min (ref >60.00)
Glucose, Bld: 108 mg/dL — ABNORMAL HIGH (ref 70–99)
Potassium: 4.2 mEq/L (ref 3.5–5.1)
Sodium: 121 mEq/L — CL (ref 135–145)
Total Bilirubin: 14.1 mg/dL — ABNORMAL HIGH (ref 0.2–1.2)
Total Protein: 6.7 g/dL (ref 6.0–8.3)

## 2019-12-27 LAB — CBC WITH DIFFERENTIAL/PLATELET
Basophils Absolute: 0.2 10*3/uL — ABNORMAL HIGH (ref 0.0–0.1)
Basophils Relative: 1 % (ref 0.0–3.0)
Eosinophils Absolute: 0.4 10*3/uL (ref 0.0–0.7)
Eosinophils Relative: 2.3 % (ref 0.0–5.0)
HCT: 30.3 % — ABNORMAL LOW (ref 39.0–52.0)
Hemoglobin: 10.7 g/dL — ABNORMAL LOW (ref 13.0–17.0)
Lymphocytes Relative: 14 % (ref 12.0–46.0)
Lymphs Abs: 2.5 10*3/uL (ref 0.7–4.0)
MCHC: 35.4 g/dL (ref 30.0–36.0)
MCV: 111.6 fl — ABNORMAL HIGH (ref 78.0–100.0)
Monocytes Absolute: 1.8 10*3/uL — ABNORMAL HIGH (ref 0.1–1.0)
Monocytes Relative: 10 % (ref 3.0–12.0)
Neutro Abs: 12.8 10*3/uL — ABNORMAL HIGH (ref 1.4–7.7)
Neutrophils Relative %: 72.7 % (ref 43.0–77.0)
Platelets: 86 10*3/uL — ABNORMAL LOW (ref 150.0–400.0)
RBC: 2.72 Mil/uL — ABNORMAL LOW (ref 4.22–5.81)
RDW: 15.5 % (ref 11.5–15.5)
WBC: 17.6 10*3/uL — ABNORMAL HIGH (ref 4.0–10.5)

## 2019-12-27 LAB — PROTIME-INR
INR: 2.2 ratio — ABNORMAL HIGH (ref 0.8–1.0)
Prothrombin Time: 24.5 s — ABNORMAL HIGH (ref 9.6–13.1)

## 2019-12-27 NOTE — Telephone Encounter (Signed)
Spoke with patient's wife, Lady Gary, in regards to Dr. Myrtie Neither recommendations. She states that she does not believe he will wait to be seen, she states that they waited for hours the last time and were not able to be seen because they did not have a bed. Advised that they do the best that they can, advised that I know the wait times are lengthy but that is the best place for him, she is aware that he is at risk for seizures at this time. She states that she will try to discuss this with him and see what he wants to do. Wife is wanting to know what would be next steps if they decided not to go to the ED, advised that he stopped the diuretics and had him on a fluid restriction but has not seen any improvement. Advised that he really needs to be evaluated in the ED. She is wanting to know what can be done as an outpatient to boost patient's sodium level. Please advise, thank you.

## 2019-12-27 NOTE — Telephone Encounter (Signed)
I understand the challenge here.  And unfortunately the long ED wait cannot be avoided.  Low sodium level in this degree of liver disease is a complex problem, and I have done what I know to do in the outpatient setting.  He needs an inpatient stay with kidney doctor consultation.

## 2019-12-27 NOTE — Telephone Encounter (Signed)
Spoke with patient's wife, Zachary Mata, to remind her that patient needs to come in for labs this morning. She states that patient should be coming by between 10 AM - 12 PM. Patient's wife had no other concerns at the end of the call.   Lab orders in epic.

## 2019-12-27 NOTE — Telephone Encounter (Signed)
Zachary Mata,  Please speak with [name], who is most likely at work.  While the creatinine level that had been elevated on Friday has returned to normal, his sodium has gone down more.  It is now in the danger range where a seizure can occur.  I am sorry to say so, but he needs to come to the emergency department for admission to the internal medicine service.  Zachary Mata or another family member can bring Zachary Mata to either [facility] or [facility] at their preference.  I will copy this to our hospital consult physician (Cirigliano) because the medical service will consult [place] after admission.  ___________________  Gae Bon,  Recent hospitalization for decompensated liver disease with acute alcoholic hepatitis on top of probable underlying cirrhosis.  His LFTs and INR have not been getting better for weeks, the WBC is only now starting to decrease from the mid 20s.  His sodium has been low and now getting worse despite recent fluid restriction and then stopping spironolactone and Aldactone 3 days ago when the creatinine had been elevated, and he has not yet been seen in the nephrology office.  - HD

## 2019-12-27 NOTE — Telephone Encounter (Signed)
Received call from lab with critical result - sodium is 120.6.

## 2019-12-27 NOTE — Telephone Encounter (Signed)
Spoke with patient's wife, she states that she is not sure what the patient will decide, she states that he has done everything that he has been told and she feels like it should be his decision. She states that they have not heard from [place], she states that when she called [facility] they stated that they had not received the referral. Advised that I sent an urgent referral and called to speak with someone still have not had a return call.   Re-faxed and called  [facility] to follow up, referral was received, the referral coordinator states that she was out one day last week and she is working on it, she states that it will be placed on the provider's desk for review and they will rate the urgency of the patient and will fax [place] the response.

## 2020-01-01 ENCOUNTER — Other Ambulatory Visit: Payer: Self-pay | Admitting: Sports Medicine

## 2020-01-01 DIAGNOSIS — F419 Anxiety disorder, unspecified: Secondary | ICD-10-CM

## 2020-01-01 DIAGNOSIS — F32A Depression, unspecified: Secondary | ICD-10-CM

## 2020-01-03 ENCOUNTER — Other Ambulatory Visit: Payer: Self-pay

## 2020-01-03 ENCOUNTER — Other Ambulatory Visit: Payer: Self-pay | Admitting: Gastroenterology

## 2020-01-03 MED ORDER — PANTOPRAZOLE SODIUM 40 MG PO TBEC: 40.0000 mg | DELAYED_RELEASE_TABLET | Freq: Every day | ORAL | 2 refills | Status: DC

## 2020-01-03 MED ORDER — GABAPENTIN 100 MG PO CAPS: 100.0000 mg | ORAL_CAPSULE | Freq: Two times a day (BID) | ORAL | 1 refills | Status: DC

## 2020-01-03 MED ORDER — PANTOPRAZOLE SODIUM 40 MG PO TBEC: 40.0000 mg | DELAYED_RELEASE_TABLET | Freq: Every day | ORAL | 1 refills | Status: DC

## 2020-01-03 MED ORDER — FUROSEMIDE 40 MG PO TABS: 40.0000 mg | ORAL_TABLET | Freq: Every day | ORAL | 1 refills | Status: DC

## 2020-01-03 NOTE — Telephone Encounter (Signed)
Spoke with patient's wife, she states that she would like for him to continue the Pantoprazole, she states that it has helped him and it keeps him from having to take OTC prilosec, she states that if it is not hurting him she would like him to continue. She states that she resumed the Lasix because patient has been having fluid building up since he stopped it, she states that he already has discomfort and the lasix helps. Advised that he should continue to hold off of on the lasix until he sees nephrologist, advised that Lasix can continue to decrease his sodium. Wife is aware that patient will need to reach out to PCP in regards to Gabapentin refill.   Called [facility], pt has been scheduled to see Dr. Thedore Mins on 01/13/20 at 3:30 PM. Was told that they would call patient if they had any cancellations.   Please advise.

## 2020-01-03 NOTE — Telephone Encounter (Signed)
He no longer needs pantoprazole. I had him stop furosemide pending nephrology evaluation, which he still needs as soon as possible for his low sodium level ( see messages a week ago - I see that they decided not to go to the ED for that). Must speak to PCP regarding the gabapentin. - HD

## 2020-01-03 NOTE — Telephone Encounter (Signed)
Chad's wife called and is requesting refills on medications from the hospital. Please advise.

## 2020-01-03 NOTE — Telephone Encounter (Signed)
Patients wife called requesting to speak with you did not to provide any further information.

## 2020-01-03 NOTE — Telephone Encounter (Signed)
Spoke with patient's wife, she states that the patient was prescribed furosemide 40 mg daily, pantoprazole 40 mg daily, and gabapentin 100 mg BID while previously in the hospital, wife wanting to know if we could refill medications. Advised that he may have to reach out to PCP in regards to Gabapentin refill but I would double check on all prescriptions. Please advise, thank you.

## 2020-01-03 NOTE — Telephone Encounter (Signed)
I refilled the pantoprazole 40 mg once daily.  The fluid overload remains a complex and challenging management problem due to the low sodium, and I do not know how the lasix or spironolactone will affect it, which is why I need help from the nephrologist.  CMP, CBC and INR by Wednesday this week and I will see about the diuretics based on those results.

## 2020-01-04 NOTE — Addendum Note (Signed)
Addended by: Annett Fabian on: 01/04/2020 12:40 PM   Modules accepted: Orders

## 2020-01-04 NOTE — Telephone Encounter (Signed)
Informed pt's spouse of lab orders being in place.

## 2020-01-04 NOTE — Telephone Encounter (Signed)
New lab orders placed. Left message for patient to call back  

## 2020-01-05 ENCOUNTER — Other Ambulatory Visit (INDEPENDENT_AMBULATORY_CARE_PROVIDER_SITE_OTHER): Payer: Commercial Managed Care - PPO

## 2020-01-05 ENCOUNTER — Other Ambulatory Visit: Payer: Self-pay

## 2020-01-05 DIAGNOSIS — K7011 Alcoholic hepatitis with ascites: Secondary | ICD-10-CM | POA: Diagnosis not present

## 2020-01-05 LAB — COMPREHENSIVE METABOLIC PANEL
ALT: 23 U/L (ref 0–53)
AST: 37 U/L (ref 0–37)
Albumin: 2.5 g/dL — ABNORMAL LOW (ref 3.5–5.2)
Alkaline Phosphatase: 198 U/L — ABNORMAL HIGH (ref 39–117)
BUN: 7 mg/dL (ref 6–23)
CO2: 25 mEq/L (ref 19–32)
Calcium: 7.8 mg/dL — ABNORMAL LOW (ref 8.4–10.5)
Chloride: 93 mEq/L — ABNORMAL LOW (ref 96–112)
Creatinine, Ser: 0.74 mg/dL (ref 0.40–1.50)
GFR: 114.76 mL/min (ref >60.00)
Glucose, Bld: 145 mg/dL — ABNORMAL HIGH (ref 70–99)
Potassium: 2.9 mEq/L — ABNORMAL LOW (ref 3.5–5.1)
Sodium: 128 mEq/L — ABNORMAL LOW (ref 135–145)
Total Bilirubin: 10 mg/dL — ABNORMAL HIGH (ref 0.2–1.2)
Total Protein: 6.1 g/dL (ref 6.0–8.3)

## 2020-01-05 LAB — PROTIME-INR
INR: 2.3 ratio — ABNORMAL HIGH (ref 0.8–1.0)
Prothrombin Time: 25.8 s — ABNORMAL HIGH (ref 9.6–13.1)

## 2020-01-05 LAB — CBC
HCT: 27.5 % — ABNORMAL LOW (ref 39.0–52.0)
Hemoglobin: 9.9 g/dL — ABNORMAL LOW (ref 13.0–17.0)
MCHC: 36 g/dL (ref 30.0–36.0)
MCV: 108 fl — ABNORMAL HIGH (ref 78.0–100.0)
Platelets: 170 10*3/uL (ref 150.0–400.0)
RBC: 2.54 Mil/uL — ABNORMAL LOW (ref 4.22–5.81)
RDW: 14.4 % (ref 11.5–15.5)
WBC: 12.9 10*3/uL — ABNORMAL HIGH (ref 4.0–10.5)

## 2020-01-10 ENCOUNTER — Other Ambulatory Visit (INDEPENDENT_AMBULATORY_CARE_PROVIDER_SITE_OTHER): Payer: Commercial Managed Care - PPO

## 2020-01-10 DIAGNOSIS — K7011 Alcoholic hepatitis with ascites: Secondary | ICD-10-CM | POA: Diagnosis not present

## 2020-01-10 LAB — CBC WITH DIFFERENTIAL/PLATELET
Basophils Absolute: 0.2 10*3/uL — ABNORMAL HIGH (ref 0.0–0.1)
Basophils Relative: 2.5 % (ref 0.0–3.0)
Eosinophils Absolute: 0.2 10*3/uL (ref 0.0–0.7)
Eosinophils Relative: 2.2 % (ref 0.0–5.0)
HCT: 27.8 % — ABNORMAL LOW (ref 39.0–52.0)
Hemoglobin: 10 g/dL — ABNORMAL LOW (ref 13.0–17.0)
Lymphocytes Relative: 22.9 % (ref 12.0–46.0)
Lymphs Abs: 2.2 10*3/uL (ref 0.7–4.0)
MCHC: 35.9 g/dL (ref 30.0–36.0)
MCV: 108.8 fl — ABNORMAL HIGH (ref 78.0–100.0)
Monocytes Absolute: 0.9 10*3/uL (ref 0.1–1.0)
Monocytes Relative: 9.6 % (ref 3.0–12.0)
Neutro Abs: 6.1 10*3/uL (ref 1.4–7.7)
Neutrophils Relative %: 62.8 % (ref 43.0–77.0)
Platelets: 212 10*3/uL (ref 150.0–400.0)
RBC: 2.55 Mil/uL — ABNORMAL LOW (ref 4.22–5.81)
RDW: 14.5 % (ref 11.5–15.5)
WBC: 9.8 10*3/uL (ref 4.0–10.5)

## 2020-01-10 LAB — COMPREHENSIVE METABOLIC PANEL
ALT: 19 U/L (ref 0–53)
AST: 36 U/L (ref 0–37)
Albumin: 2.4 g/dL — ABNORMAL LOW (ref 3.5–5.2)
Alkaline Phosphatase: 178 U/L — ABNORMAL HIGH (ref 39–117)
BUN: 5 mg/dL — ABNORMAL LOW (ref 6–23)
CO2: 24 mEq/L (ref 19–32)
Calcium: 7.7 mg/dL — ABNORMAL LOW (ref 8.4–10.5)
Chloride: 101 mEq/L (ref 96–112)
Creatinine, Ser: 0.71 mg/dL (ref 0.40–1.50)
GFR: 116.2 mL/min (ref >60.00)
Glucose, Bld: 134 mg/dL — ABNORMAL HIGH (ref 70–99)
Potassium: 3.4 mEq/L — ABNORMAL LOW (ref 3.5–5.1)
Sodium: 133 mEq/L — ABNORMAL LOW (ref 135–145)
Total Bilirubin: 7.9 mg/dL — ABNORMAL HIGH (ref 0.2–1.2)
Total Protein: 6.1 g/dL (ref 6.0–8.3)

## 2020-01-10 LAB — PROTIME-INR
INR: 2 ratio — ABNORMAL HIGH (ref 0.8–1.0)
Prothrombin Time: 22.1 s — ABNORMAL HIGH (ref 9.6–13.1)

## 2020-01-11 ENCOUNTER — Other Ambulatory Visit: Payer: Self-pay

## 2020-01-11 MED ORDER — SPIRONOLACTONE 100 MG PO TABS: 100.0000 mg | ORAL_TABLET | Freq: Every day | ORAL | 2 refills | Status: DC

## 2020-01-17 ENCOUNTER — Telehealth: Payer: Self-pay | Admitting: Gastroenterology

## 2020-01-17 NOTE — Telephone Encounter (Signed)
Pt's spouse is requesting a call back from a nurse to discuss when to come and do the pt's labs.

## 2020-01-17 NOTE — Telephone Encounter (Signed)
Pt had labs there, called for results and note to be faxed over. Wife states they decreased lasix to 20mg  daily.

## 2020-01-17 NOTE — Telephone Encounter (Signed)
I will review the note and labs from [facility] along with their recommendations.  No labs this week.  CMP early next week.

## 2020-01-17 NOTE — Telephone Encounter (Signed)
If this patient went to [facility] office consult last week, then I need the physician's consult note, please. Please ask patient's wife did labs there or made any medicine changes

## 2020-01-17 NOTE — Telephone Encounter (Signed)
Pts wife calling asking if pt needs to have labs drawn this week. If so they would like to come Monday. Also requests refill on Lactulose. Please advise.

## 2020-01-18 ENCOUNTER — Other Ambulatory Visit: Payer: Self-pay

## 2020-01-18 DIAGNOSIS — K7011 Alcoholic hepatitis with ascites: Secondary | ICD-10-CM

## 2020-01-18 MED ORDER — LACTULOSE 10 GM/15ML PO SOLN: 30.0000 g | Freq: Every day | ORAL | 2 refills | Status: DC

## 2020-01-18 NOTE — Telephone Encounter (Signed)
Order in for lab. Lactulose refilled. Unable to reach wife or leave a message.  Spoke with pts wife and she is aware.

## 2020-01-25 ENCOUNTER — Encounter: Payer: Self-pay | Admitting: Gastroenterology

## 2020-01-25 ENCOUNTER — Other Ambulatory Visit (INDEPENDENT_AMBULATORY_CARE_PROVIDER_SITE_OTHER): Payer: Commercial Managed Care - PPO

## 2020-01-25 DIAGNOSIS — K7011 Alcoholic hepatitis with ascites: Secondary | ICD-10-CM | POA: Diagnosis not present

## 2020-01-25 LAB — COMPREHENSIVE METABOLIC PANEL
ALT: 23 U/L (ref 0–53)
AST: 39 U/L — ABNORMAL HIGH (ref 0–37)
Albumin: 2.9 g/dL — ABNORMAL LOW (ref 3.5–5.2)
Alkaline Phosphatase: 199 U/L — ABNORMAL HIGH (ref 39–117)
BUN: 11 mg/dL (ref 6–23)
CO2: 27 mEq/L (ref 19–32)
Calcium: 8.5 mg/dL (ref 8.4–10.5)
Chloride: 96 mEq/L (ref 96–112)
Creatinine, Ser: 0.84 mg/dL (ref 0.40–1.50)
GFR: 110.41 mL/min (ref >60.00)
Glucose, Bld: 110 mg/dL — ABNORMAL HIGH (ref 70–99)
Potassium: 4.3 mEq/L (ref 3.5–5.1)
Sodium: 128 mEq/L — ABNORMAL LOW (ref 135–145)
Total Bilirubin: 7.1 mg/dL — ABNORMAL HIGH (ref 0.2–1.2)
Total Protein: 7.4 g/dL (ref 6.0–8.3)

## 2020-01-25 NOTE — Progress Notes (Signed)
Office consult note from Dr.Vikas Thedore Mins at [facility] dated 01/13/2020 indicates patient was started on Ureaaide 15 mg twice daily and patient's furosemide decreased from 40 mg a day to 20 mg a day  Labs at that clinic visit showed BUN of 5, creatinine 0.75, sodium 137, potassium 4.3, chloride 103, bicarb 19, calcium 8.0, phosphorus 3.7, albumin 2.5, uric acid 3.4

## 2020-01-31 ENCOUNTER — Telehealth: Payer: Self-pay | Admitting: Gastroenterology

## 2020-01-31 NOTE — Telephone Encounter (Signed)
The pt has been advised that no orders have been requested at this time.  I will pass this on to [facility] and someone will call when order has been entered if requested by Dr Myrtie Neither.

## 2020-01-31 NOTE — Telephone Encounter (Signed)
Patients wife called to check if there were lab orders placed in Epic per recent lab results from the kidney physician. Please advise once done so they can get them done.

## 2020-02-03 ENCOUNTER — Ambulatory Visit (INDEPENDENT_AMBULATORY_CARE_PROVIDER_SITE_OTHER): Payer: Commercial Managed Care - PPO | Admitting: Sports Medicine

## 2020-02-03 ENCOUNTER — Other Ambulatory Visit: Payer: Self-pay

## 2020-02-03 ENCOUNTER — Encounter: Payer: Self-pay | Admitting: Sports Medicine

## 2020-02-03 DIAGNOSIS — K7030 Alcoholic cirrhosis of liver without ascites: Secondary | ICD-10-CM | POA: Diagnosis not present

## 2020-02-03 DIAGNOSIS — F419 Anxiety disorder, unspecified: Secondary | ICD-10-CM

## 2020-02-03 DIAGNOSIS — N529 Male erectile dysfunction, unspecified: Secondary | ICD-10-CM

## 2020-02-03 DIAGNOSIS — F32A Depression, unspecified: Secondary | ICD-10-CM

## 2020-02-03 MED ORDER — ESCITALOPRAM OXALATE 5 MG PO TABS: 5.0000 mg | ORAL_TABLET | Freq: Every day | ORAL | 3 refills | Status: DC

## 2020-02-03 MED ORDER — MIRTAZAPINE 30 MG PO TABS: 30.0000 mg | ORAL_TABLET | Freq: Every day | ORAL | 3 refills | Status: DC

## 2020-02-03 MED ORDER — TADALAFIL 10 MG PO TABS: 10.0000 mg | ORAL_TABLET | Freq: Every day | ORAL | 11 refills | Status: DC | PRN

## 2020-02-03 NOTE — Assessment & Plan Note (Signed)
Zachary Mata has significant Axis III factors contributing to his anxiety and depression. We added mirtazapine, he has noted some improvement at 15 mg, increasing to 30, with his liver dysfunction I think 30 mg will be his maximum. Multiple to add Lexapro 5 mg daily to help with his sleep and anxiety as well. They did double the hydroxyzine to help with sleep but this did not help yet. Return to see me in 4 weeks for this.

## 2020-02-03 NOTE — Progress Notes (Signed)
    Procedures performed today:    None.  Independent interpretation of notes and tests performed by another provider:   None.  Brief History, Exam, Impression, and Recommendations:    Alcoholic cirrhosis of liver without ascites (HCC) Zachary Mata returns, it looks like his liver dysfunction is stabilizing. We will recheck his CBC and his CMP. He is followed closely by gastroenterology.  Anxiety and depression Zachary Mata has significant Axis III factors contributing to his anxiety and depression. We added mirtazapine, he has noted some improvement at 15 mg, increasing to 30, with his liver dysfunction I think 30 mg will be his maximum. Multiple to add Lexapro 5 mg daily to help with his sleep and anxiety as well. They did double the hydroxyzine to help with sleep but this did not help yet. Return to see me in 4 weeks for this.  Erectile dysfunction We have tried Cialis in the past, he is reporting poor sexual drive, it sounds like on further questioning that there is also difficulty with erectile function likely leading to his poor sex drive. We will start Cialis 10 mg which is the max considering his current hepatic child Pugh class B.    ___________________________________________ Zachary Mata. Benjamin Stain, M.D., ABFM., CAQSM. Primary Care and Sports Medicine [facility]  Adjunct Instructor of Family Medicine  [employer]

## 2020-02-03 NOTE — Telephone Encounter (Signed)
Lm on vm for Amber at [facility] to see if Dr. Thedore Mins had any recommendations for repeat labs for the patient.

## 2020-02-03 NOTE — Assessment & Plan Note (Signed)
We have tried Cialis in the past, he is reporting poor sexual drive, it sounds like on further questioning that there is also difficulty with erectile function likely leading to his poor sex drive. We will start Cialis 10 mg which is the max considering his current hepatic child Pugh class B.

## 2020-02-03 NOTE — Assessment & Plan Note (Signed)
[name] returns, it looks like his liver dysfunction is stabilizing. We will recheck his CBC and his CMP. He is followed closely by gastroenterology.

## 2020-02-04 LAB — COMPREHENSIVE METABOLIC PANEL
AG Ratio: 0.8 (calc) — ABNORMAL LOW (ref 1.0–2.5)
ALT: 35 U/L (ref 9–46)
AST: 47 U/L — ABNORMAL HIGH (ref 10–40)
Albumin: 3.3 g/dL — ABNORMAL LOW (ref 3.6–5.1)
Alkaline phosphatase (APISO): 188 U/L — ABNORMAL HIGH (ref 36–130)
BUN: 13 mg/dL (ref 7–25)
CO2: 25 mmol/L (ref 20–32)
Calcium: 9 mg/dL (ref 8.6–10.3)
Chloride: 95 mmol/L — ABNORMAL LOW (ref 98–110)
Creat: 0.81 mg/dL (ref 0.60–1.35)
Globulin: 4.2 g/dL (calc) — ABNORMAL HIGH (ref 1.9–3.7)
Glucose, Bld: 126 mg/dL — ABNORMAL HIGH (ref 65–99)
Potassium: 4.2 mmol/L (ref 3.5–5.3)
Sodium: 128 mmol/L — ABNORMAL LOW (ref 135–146)
Total Bilirubin: 7.3 mg/dL — ABNORMAL HIGH (ref 0.2–1.2)
Total Protein: 7.5 g/dL (ref 6.1–8.1)

## 2020-02-04 LAB — PROTIME-INR
INR: 1.3 — ABNORMAL HIGH
Prothrombin Time: 14.2 s — ABNORMAL HIGH (ref 9.0–11.5)

## 2020-02-04 LAB — CBC
HCT: 29.4 % — ABNORMAL LOW (ref 38.5–50.0)
Hemoglobin: 11.3 g/dL — ABNORMAL LOW (ref 13.2–17.1)
MCH: 38.4 pg — ABNORMAL HIGH (ref 27.0–33.0)
MCHC: 38.4 g/dL — ABNORMAL HIGH (ref 32.0–36.0)
MCV: 100 fL (ref 80.0–100.0)
MPV: 8.8 fL (ref 7.5–12.5)
Platelets: 151 10*3/uL (ref 140–400)
RBC: 2.94 10*6/uL — ABNORMAL LOW (ref 4.20–5.80)
RDW: 12.6 % (ref 11.0–15.0)
WBC: 11.7 10*3/uL — ABNORMAL HIGH (ref 3.8–10.8)

## 2020-02-07 NOTE — Telephone Encounter (Signed)
Spoke with Misty Stanley at [facility], advised that I had received a return call in regards to below. She transferred me to Angie, Chief of Staff, I had to leave a voicemail. Asked that they call the wife if patient is needing repeat labs. Provided my number if she had any further questions.

## 2020-02-10 ENCOUNTER — Ambulatory Visit: Payer: Commercial Managed Care - PPO | Admitting: Gastroenterology

## 2020-02-10 ENCOUNTER — Encounter: Payer: Self-pay | Admitting: Gastroenterology

## 2020-02-10 VITALS — BP 136/78 | HR 102 | Ht 66.0 in | Wt 129.2 lb

## 2020-02-10 DIAGNOSIS — K7290 Hepatic failure, unspecified without coma: Secondary | ICD-10-CM | POA: Diagnosis not present

## 2020-02-10 DIAGNOSIS — K7011 Alcoholic hepatitis with ascites: Secondary | ICD-10-CM

## 2020-02-10 DIAGNOSIS — E871 Hypo-osmolality and hyponatremia: Secondary | ICD-10-CM

## 2020-02-10 DIAGNOSIS — K7682 Hepatic encephalopathy: Secondary | ICD-10-CM

## 2020-02-10 NOTE — Patient Instructions (Addendum)
If you are age 39 or older, your body mass index should be between 23-30. Your Body mass index is 20.85 kg/m. If this is out of the aforementioned range listed, please consider follow up with your Primary Care Provider.  If you are age 61 or younger, your body mass index should be between 19-25. Your Body mass index is 20.85 kg/m. If this is out of the aformentioned range listed, please consider follow up with your Primary Care Provider.   You have been scheduled for an endoscopy. Please follow written instructions given to you at your visit today. If you use inhalers (even only as needed), please bring them with you on the day of your procedure.  Due to recent changes in healthcare laws, you may see the results of your imaging and laboratory studies on MyChart before your provider has had a chance to review them.  We understand that in some cases there may be results that are confusing or concerning to you. Not all laboratory results come back in the same time frame and the provider may be waiting for multiple results in order to interpret others.  Please give [place] 48 hours in order for your provider to thoroughly review all the results before contacting the office for clarification of your results.   It was a pleasure to see you today!  Dr. Myrtie Neither

## 2020-02-10 NOTE — Progress Notes (Signed)
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  Sequential chemistry panels showed improvement in LFTs and INR to what appears to be his new baseline.  He was being diuresed with low-dose spironolactone and furosemide, made challenging by persistent hyponatremia and one episode of hypokalemia.  His medicines were briefly held, his sodium got down to 120, ED evaluations were recommended but the patient and his wife opted not to do that.  He was seen by nephrology a few weeks back and they decrease furosemide from 40 mg to 20 mg daily and began Ureaid. I received nephrology's Mata including lab results from the day of that visit (noted below).  Zachary Mata and I messaged just before the Christmas holiday and I asked that his office draw a chemistry panel early the following since I would be out of the office.  There was some miscommunication where his office thought we would draw the labs and send the results to Zachary Mata for review.  However, labs were done with PCP on 02/03/20.  Zachary Mata has generally been well since I last saw him about 2 months ago.  He is taking his meds as recommended and controlling sodium intake.  He has less abdominal distention while on furosemide and no peripheral edema.  He has 2-5 BMs per day taking lactulose 45 cc in the morning, and his wife thinks that sometimes it depends on what he eats that day as well.  No rectal bleeding, no dysphagia or odynophagia, no melena or hematemesis. They applied for disability about 2 months ago but apparently the process has been quite slow.  Medicines prescribed by primary care have help with his chronic pain and insomnia. [name] says that chads thinking has been clear and he has not had falls or unsteadiness.  ROS: Cardiovascular:  no chest  pain Respiratory: no dyspnea mood stable Chronic musculoskeletal pain Remainder systems negative except as above  The patient's Past Medical, Family and Social History were reviewed and are on file in the EMR.  Objective:  Med list reviewed  Current Outpatient Medications:  .  escitalopram (LEXAPRO) 5 MG tablet, Take 1 tablet (5 mg total) by mouth daily., Disp: 30 tablet, Rfl: 3 .  furosemide (LASIX) 20 MG tablet, Take 20 mg by mouth daily., Disp: , Rfl:  .  gabapentin (NEURONTIN) 100 MG capsule, Take 1 capsule (100 mg total) by mouth 2 (two) times daily., Disp: 180 capsule, Rfl: 1 .  hydrOXYzine (ATARAX/VISTARIL) 25 MG tablet, TAKE 1 TABLET BY MOUTH EVERYDAY AT BEDTIME, Disp: 90 tablet, Rfl: 2 .  lactulose (CHRONULAC) 10 GM/15ML solution, Take 45 mLs (30 g total) by mouth daily., Disp: 1350 mL, Rfl: 2 .  mirtazapine (REMERON) 30 MG tablet, Take 1 tablet (30 mg total) by mouth at bedtime., Disp: 30 tablet, Rfl: 3 .  spironolactone (ALDACTONE) 100 MG tablet, Take 1 tablet (100 mg total) by mouth daily., Disp: 30 tablet, Rfl: 2 .  tadalafil (CIALIS) 10 MG tablet, Take 1 tablet (10 mg total) by mouth daily as needed for erectile dysfunction., Disp: 30 tablet, Rfl: 11 .  pantoprazole (PROTONIX) 40 MG tablet, Take 1 tablet (40 mg total) by mouth daily., Disp: 30 tablet, Rfl: 2   Vital signs in last 24 hrs: Vitals:   02/10/20 0828  BP: 136/78  Pulse: (!) 102  SpO2: 98%  Physical Exam  Steady gait, no asterixis, speech fluent and normal affect.  Walks to the bathroom steadily and without difficulty and gets on the exam table easily.  HEENT: sclera mildly icteric, oral mucosa moist without lesions.  2 prominent hemangiomas under right eye and right cheek  Neck: supple, no thyromegaly, JVD or lymphadenopathy  Cardiac: RRR without murmurs, S1S2 heard, no peripheral edema  Pulm: clear to auscultation bilaterally, normal RR and effort noted  Abdomen: soft, no tenderness, with active  bowel sounds.  Left lobe liver enlarged.  Mild bulging flanks, no distention.  Skin; warm and dry, + jaundice, no rash  Labs:  CMP Latest Ref Rng & Units 02/03/2020 01/25/2020 01/10/2020  Glucose 65 - 99 mg/dL 789(F) 810(F) 751(W)  BUN 7 - 25 mg/dL 13 11 5(L)  Creatinine 0.60 - 1.35 mg/dL 2.58 5.27 7.82  Sodium 135 - 146 mmol/L 128(L) 128(L) 133(L)  Potassium 3.5 - 5.3 mmol/L 4.2 4.3 3.4(L)  Chloride 98 - 110 mmol/L 95(L) 96 101  CO2 20 - 32 mmol/L 25 27 24   Calcium 8.6 - 10.3 mg/dL 9.0 8.5 7.7(L)  Total Protein 6.1 - 8.1 g/dL 7.5 7.4 6.1  Total Bilirubin 0.2 - 1.2 mg/dL 7.3(H) 7.1(H) 7.9(H)  Alkaline Phos 39 - 117 U/L - 199(H) 178(H)  AST 10 - 40 U/L 47(H) 39(H) 36  ALT 9 - 46 U/L 35 23 19   Lab Results  Component Value Date   INR 1.3 (H) 02/03/2020   INR 2.0 (H) 01/10/2020   INR 2.3 (H) 01/05/2020     Also, my documentation from patient's 01/13/20 nephrology consult:  "Office consult Mata from Zachary Mata 14/9/21 at [facility] dated 01/13/2020 indicates patient was started on Ureaaide 15 mg twice daily and patient's furosemide decreased from 40 mg a day to 20 mg a day   Labs at that clinic visit showed BUN of 5, creatinine 0.75, sodium 137, potassium 4.3, chloride 103, bicarb 19, calcium 8.0, phosphorus 3.7, albumin 2.5, uric acid 3.4"  ___________________________________________ Radiologic studies:   ____________________________________________ Other:  Actin Ab mildly elevated at 22 in Aug 2019 and in Oct 2021 _____________________________________________ Assessment & Plan  Assessment: Encounter Diagnoses  Name Primary?  . Alcoholic hepatitis with ascites Yes  . Encephalopathy, hepatic (HCC)   . Hyponatremia    His condition has steadily improved and appears to have reached his new baseline.  Volume overload under good control on this relatively low-dose diuretic regimen.  He was not able to tolerate a 40 mg daily dose of furosemide as it caused more severe  hyponatremia. He is reportedly seeing Dr. Nov 2021 on the 17th of this month, so we will send my Mata along today and request that they do the same after that visit.  19 does not need any labs with me today, but should have a BMP at the upcoming nephrology visit. Encephalopathy under good control.  I told Zachary Mata that if he has 4 5 BMs in a day, that the following day she can make a slight decrease in the lactulose from 45 mL to 30 mL for that dose.  I do not think he has autoimmune hepatitis despite a mildly increased actin antibody in the setting of severe acute hepatitis.  His transaminases are near normal at this point.  Lastly, upper endoscopy necessary for variceal screening. Rationale for, nature of procedure and risks were reviewed and they were agreeable  The benefits and risks of the planned procedure were described in detail with the patient or (  when appropriate) their health care proxy.  Risks were outlined as including, but not limited to, bleeding, infection, perforation, adverse medication reaction leading to cardiac or pulmonary decompensation, pancreatitis (if ERCP).  The limitation of incomplete mucosal visualization was also discussed.  No guarantees or warranties were given.  Office visit with me in 2 months  45 minutes were spent on this encounter (including chart review, history/exam, counseling/coordination of care, and documentation) > 50% of that time was spent on counseling and coordination of care.  Topics discussed included: Cirrhosis, fluid overload, sodium restriction, diuretic management, follow-up with nephrology consultant and upper endoscopy.  Nelida Meuse III

## 2020-02-25 ENCOUNTER — Other Ambulatory Visit: Payer: Self-pay | Admitting: Sports Medicine

## 2020-02-25 DIAGNOSIS — F419 Anxiety disorder, unspecified: Secondary | ICD-10-CM

## 2020-03-01 ENCOUNTER — Encounter: Payer: Self-pay | Admitting: Gastroenterology

## 2020-03-01 NOTE — Progress Notes (Signed)
Nephrology note with labs dated 02/23/2020 reviewed and will be sent to scan.  Sodium 131, potassium 4.2, chloride 96, bicarb 21, calcium 9.1, phosphorus 4.2, albumin 3.4  Dr. Anthony Sar plans to continue the patient's current diuretic doses and see [name] in follow-up in 2 months.  Ellwood Dense, MD

## 2020-03-06 ENCOUNTER — Other Ambulatory Visit: Payer: Self-pay | Admitting: Sports Medicine

## 2020-03-06 DIAGNOSIS — F32A Depression, unspecified: Secondary | ICD-10-CM

## 2020-03-06 DIAGNOSIS — F419 Anxiety disorder, unspecified: Secondary | ICD-10-CM

## 2020-03-06 MED ORDER — HYDROXYZINE HCL 25 MG PO TABS: 25.0000 mg | ORAL_TABLET | Freq: Every evening | ORAL | 3 refills | Status: DC | PRN

## 2020-03-07 ENCOUNTER — Other Ambulatory Visit: Payer: Self-pay | Admitting: Sports Medicine

## 2020-03-07 DIAGNOSIS — F32A Depression, unspecified: Secondary | ICD-10-CM

## 2020-03-07 DIAGNOSIS — F419 Anxiety disorder, unspecified: Secondary | ICD-10-CM

## 2020-03-07 MED ORDER — HYDROXYZINE HCL 25 MG PO TABS: 25.0000 mg | ORAL_TABLET | Freq: Two times a day (BID) | ORAL | 3 refills | Status: DC | PRN

## 2020-03-08 ENCOUNTER — Other Ambulatory Visit: Payer: Self-pay | Admitting: Gastroenterology

## 2020-03-09 ENCOUNTER — Telehealth: Payer: Self-pay

## 2020-03-09 LAB — SARS CORONAVIRUS 2 (TAT 6-24 HRS): SARS Coronavirus 2: NEGATIVE

## 2020-03-09 NOTE — Telephone Encounter (Signed)
Patient's wife called in to clarify instructions for EGD on 03/10/20. She wanted to know if patient could take his medications in the morning, advised that patient can take morning medications but they have to be taken by 6:30 AM. Wife verbalized understanding of this information and had no further concerns at the end of the call.

## 2020-03-10 ENCOUNTER — Encounter: Payer: Commercial Managed Care - PPO | Admitting: Gastroenterology

## 2020-03-10 ENCOUNTER — Other Ambulatory Visit: Payer: Self-pay

## 2020-03-10 ENCOUNTER — Encounter: Payer: Self-pay | Admitting: Gastroenterology

## 2020-03-10 ENCOUNTER — Ambulatory Visit (AMBULATORY_SURGERY_CENTER): Payer: Commercial Managed Care - PPO | Admitting: Gastroenterology

## 2020-03-10 VITALS — BP 124/82 | HR 85 | Temp 98.3°F | Resp 16 | Ht 66.0 in | Wt 129.0 lb

## 2020-03-10 DIAGNOSIS — K319 Disease of stomach and duodenum, unspecified: Secondary | ICD-10-CM | POA: Diagnosis not present

## 2020-03-10 DIAGNOSIS — I8510 Secondary esophageal varices without bleeding: Secondary | ICD-10-CM | POA: Diagnosis not present

## 2020-03-10 DIAGNOSIS — K7011 Alcoholic hepatitis with ascites: Secondary | ICD-10-CM

## 2020-03-10 DIAGNOSIS — K766 Portal hypertension: Secondary | ICD-10-CM

## 2020-03-10 MED ORDER — SODIUM CHLORIDE 0.9 % IV SOLN: 500.0000 mL | Freq: Once | INTRAVENOUS | Status: DC

## 2020-03-10 MED ORDER — NADOLOL 20 MG PO TABS: 20.0000 mg | ORAL_TABLET | Freq: Every day | ORAL | 2 refills | Status: DC

## 2020-03-10 NOTE — Progress Notes (Signed)
Medical history reviewed with no changes since PV. VS assessed by C.W 

## 2020-03-10 NOTE — Progress Notes (Signed)
Report to PACU, RN, vss, BBS= Clear.  

## 2020-03-10 NOTE — Patient Instructions (Signed)
YOU HAD AN ENDOSCOPIC PROCEDURE TODAY AT THE [facility]:   Refer to the procedure report that was given to you for any specific questions about what was found during the examination.  If the procedure report does not answer your questions, please call your gastroenterologist to clarify.  If you requested that your care partner not be given the details of your procedure findings, then the procedure report has been included in a sealed envelope for you to review at your convenience later.  YOU SHOULD EXPECT: Some feelings of bloating in the abdomen. Passage of more gas than usual.  Walking can help get rid of the air that was put into your GI tract during the procedure and reduce the bloating. If you had a lower endoscopy (such as a colonoscopy or flexible sigmoidoscopy) you may notice spotting of blood in your stool or on the toilet paper. If you underwent a bowel prep for your procedure, you may not have a normal bowel movement for a few days.  Please Note:  You might notice some irritation and congestion in your nose or some drainage.  This is from the oxygen used during your procedure.  There is no need for concern and it should clear up in a day or so.  SYMPTOMS TO REPORT IMMEDIATELY:    Following upper endoscopy (EGD)  Vomiting of blood or coffee ground material  New chest pain or pain under the shoulder blades  Painful or persistently difficult swallowing  New shortness of breath  Fever of 100F or higher  Black, tarry-looking stools  For urgent or emergent issues, a gastroenterologist can be reached at any hour by calling [phone]. Do not use MyChart messaging for urgent concerns.    DIET:  We do recommend a small meal at first, but then you may proceed to your regular diet.  Drink plenty of fluids but you should avoid alcoholic beverages for 24 hours.  ACTIVITY:  You should plan to take it easy for the rest of today and you should NOT DRIVE or use heavy machinery  until tomorrow (because of the sedation medicines used during the test).    FOLLOW UP: Our staff will call the number listed on your records 48-72 hours following your procedure to check on you and address any questions or concerns that you may have regarding the information given to you following your procedure. If we do not reach you, we will leave a message.  We will attempt to reach you two times.  During this call, we will ask if you have developed any symptoms of COVID 19. If you develop any symptoms (ie: fever, flu-like symptoms, shortness of breath, cough etc.) before then, please call [phone].  If you test positive for Covid 19 in the 2 weeks post procedure, please call and report this information to us.    If any biopsies were taken you will be contacted by phone or by letter within the next 1-3 weeks.  Please call us at [phone] if you have not heard about the biopsies in 3 weeks.    SIGNATURES/CONFIDENTIALITY: You and/or your care partner have signed paperwork which will be entered into your electronic medical record.  These signatures attest to the fact that that the information above on your After Visit Summary has been reviewed and is understood.  Full responsibility of the confidentiality of this discharge information lies with you and/or your care-partner. 

## 2020-03-10 NOTE — Op Note (Signed)
[facility] Patient Name: Zachary Mata Procedure Date: 03/10/2020 9:58 AM MRN: [id number] Endoscopist: Sherilyn Cooter L. Myrtie Neither , MD Age: 40 Referring MD:  Date of Birth: 10/07/1980 Gender: Male Account #: [id number] Procedure:                Upper GI endoscopy Indications:              Cirrhosis rule out esophageal varices Medicines:                Monitored Anesthesia Care Procedure:                Pre-Anesthesia Assessment:                           - Prior to the procedure, a History and Physical                            was performed, and patient medications and                            allergies were reviewed. The patient's tolerance of                            previous anesthesia was also reviewed. The risks                            and benefits of the procedure and the sedation                            options and risks were discussed with the patient.                            All questions were answered, and informed consent                            was obtained. Prior Anticoagulants: The patient has                            taken no previous anticoagulant or antiplatelet                            agents. ASA Grade Assessment: III - A patient with                            severe systemic disease. After reviewing the risks                            and benefits, the patient was deemed in                            satisfactory condition to undergo the procedure.                           After obtaining informed consent, the endoscope was  passed under direct vision. Throughout the                            procedure, the patient's blood pressure, pulse, and                            oxygen saturations were monitored continuously. The                            Endoscope was introduced through the mouth, and                            advanced to the second part of duodenum. The upper                            GI endoscopy was  accomplished without difficulty.                            The patient tolerated the procedure well. Scope In: Scope Out: Findings:                 The larynx was normal.                           Grade II varices were found in the lower third of                            the esophagus.                           There is no endoscopic evidence of varices in the                            entire examined stomach.                           Portal hypertensive gastropathy without friability                            or bleeding was found in the entire examined                            stomach.                           The examined duodenum was normal. Complications:            No immediate complications. Estimated Blood Loss:     Estimated blood loss: none. Impression:               - Normal larynx.                           - Grade II esophageal varices.                           - Portal hypertensive gastropathy.                           -  Normal examined duodenum.                           - No specimens collected. Recommendation:           - Patient has a contact number available for                            emergencies. The signs and symptoms of potential                            delayed complications were discussed with the                            patient. Return to normal activities tomorrow.                            Written discharge instructions were provided to the                            patient.                           - Resume previous diet.                           - Continue present medications.                           - Nadolol 20 mg once daily. Disp#30, RF 2                           (will dose adjust as needed to achieve resting                            pulse in the 60s)                           See me in the office as scheduled next month. Lauramae Kneisley L. Myrtie Neither, MD 03/10/2020 10:18:53 AM This report has been signed electronically.

## 2020-03-14 ENCOUNTER — Telehealth: Payer: Self-pay | Admitting: *Deleted

## 2020-03-14 NOTE — Telephone Encounter (Signed)
1. Have you developed a fever since your procedure? no  2.   Have you had an respiratory symptoms (SOB or cough) since your procedure? no  3.   Have you tested positive for COVID 19 since your procedure no  4.   Have you had any family members/close contacts diagnosed with the COVID 19 since your procedure?  no   If yes to any of these questions please route to Laverna Peace, RN and Karlton Lemon, RN Follow up Call-  Call back number 03/10/2020  Post procedure Call Back phone  # [phone]  Permission to leave phone message Yes  Some recent data might be hidden     Patient questions:  Do you have a fever, pain , or abdominal swelling? No. Pain Score  0 *  Have you tolerated food without any problems? Yes.    Have you been able to return to your normal activities? Yes.    Do you have any questions about your discharge instructions: Diet   No. Medications  No. Follow up visit  No.  Do you have questions or concerns about your Care? No.  Actions: * If pain score is 4 or above: No action needed, pain <4.

## 2020-03-15 ENCOUNTER — Encounter: Payer: Self-pay | Admitting: Sports Medicine

## 2020-03-15 ENCOUNTER — Ambulatory Visit (INDEPENDENT_AMBULATORY_CARE_PROVIDER_SITE_OTHER): Payer: Commercial Managed Care - PPO | Admitting: Sports Medicine

## 2020-03-15 ENCOUNTER — Other Ambulatory Visit: Payer: Self-pay

## 2020-03-15 DIAGNOSIS — F419 Anxiety disorder, unspecified: Secondary | ICD-10-CM

## 2020-03-15 DIAGNOSIS — N529 Male erectile dysfunction, unspecified: Secondary | ICD-10-CM | POA: Diagnosis not present

## 2020-03-15 DIAGNOSIS — F32A Depression, unspecified: Secondary | ICD-10-CM

## 2020-03-15 DIAGNOSIS — M5442 Lumbago with sciatica, left side: Secondary | ICD-10-CM

## 2020-03-15 DIAGNOSIS — G8929 Other chronic pain: Secondary | ICD-10-CM

## 2020-03-15 DIAGNOSIS — M5441 Lumbago with sciatica, right side: Secondary | ICD-10-CM

## 2020-03-15 DIAGNOSIS — K7030 Alcoholic cirrhosis of liver without ascites: Secondary | ICD-10-CM | POA: Diagnosis not present

## 2020-03-15 MED ORDER — GABAPENTIN 300 MG PO CAPS: 300.0000 mg | ORAL_CAPSULE | Freq: Two times a day (BID) | ORAL | 3 refills | Status: DC

## 2020-03-15 MED ORDER — ESCITALOPRAM OXALATE 10 MG PO TABS: 10.0000 mg | ORAL_TABLET | Freq: Every day | ORAL | 3 refills | Status: DC

## 2020-03-15 NOTE — Assessment & Plan Note (Signed)
[name] is also having nighttime radicular symptoms, we will increase his gabapentin, 300 mg twice daily.

## 2020-03-15 NOTE — Progress Notes (Signed)
    Procedures performed today:    None.  Independent interpretation of notes and tests performed by another provider:   None.  Brief History, Exam, Impression, and Recommendations:    Anxiety and depression Definite Axis III factors contributing to anxiety and depression. Mirtazapine at 30 mg has done a good job in helping him gain weight, this will be his maximum considering his liver dysfunction, I added Lexapro 5 the last visit which has given him profound improvement in his anxiety and depression, increasing to 10 mg, continue hydroxyzine as needed for anxiety, return to see me in a month for PHQ and GAD.  Chronic back pain [name] is also having nighttime radicular symptoms, we will increase his gabapentin, 300 mg twice daily.  Erectile dysfunction Continue Cialis 5 mg daily at the most, he has tried up to 20 total milligrams without improvement, declines penile compressive ring. I think we should limit his Cialis to a total of 10 mg at a time, I do think that his erectile function will likely improve as his liver function improves. I would not recommend additional aggressive treatment for this at this time    ___________________________________________ Zachary Mata. Benjamin Stain, M.D., ABFM., CAQSM. Primary Care and Sports Medicine [facility]  Adjunct Instructor of Family Medicine  [employer]

## 2020-03-15 NOTE — Addendum Note (Signed)
Addended by: Monica Becton on: 03/15/2020 04:14 PM   Modules accepted: Orders

## 2020-03-15 NOTE — Assessment & Plan Note (Signed)
Definite Axis III factors contributing to anxiety and depression. Mirtazapine at 30 mg has done a good job in helping him gain weight, this will be his maximum considering his liver dysfunction, I added Lexapro 5 the last visit which has given him profound improvement in his anxiety and depression, increasing to 10 mg, continue hydroxyzine as needed for anxiety, return to see me in a month for PHQ and GAD.

## 2020-03-15 NOTE — Assessment & Plan Note (Signed)
Continue Cialis 5 mg daily at the most, he has tried up to 20 total milligrams without improvement, declines penile compressive ring. I think we should limit his Cialis to a total of 10 mg at a time, I do think that his erectile function will likely improve as his liver function improves. I would not recommend additional aggressive treatment for this at this time

## 2020-03-16 LAB — CBC
HCT: 33.5 % — ABNORMAL LOW (ref 38.5–50.0)
Hemoglobin: 12.5 g/dL — ABNORMAL LOW (ref 13.2–17.1)
MCH: 36.8 pg — ABNORMAL HIGH (ref 27.0–33.0)
MCHC: 37.3 g/dL — ABNORMAL HIGH (ref 32.0–36.0)
MCV: 98.5 fL (ref 80.0–100.0)
MPV: 9.8 fL (ref 7.5–12.5)
Platelets: 152 10*3/uL (ref 140–400)
RBC: 3.4 10*6/uL — ABNORMAL LOW (ref 4.20–5.80)
RDW: 12.2 % (ref 11.0–15.0)
WBC: 11.3 10*3/uL — ABNORMAL HIGH (ref 3.8–10.8)

## 2020-03-16 LAB — COMPREHENSIVE METABOLIC PANEL
AG Ratio: 0.8 (calc) — ABNORMAL LOW (ref 1.0–2.5)
ALT: 38 U/L (ref 9–46)
AST: 43 U/L — ABNORMAL HIGH (ref 10–40)
Albumin: 3.3 g/dL — ABNORMAL LOW (ref 3.6–5.1)
Alkaline phosphatase (APISO): 161 U/L — ABNORMAL HIGH (ref 36–130)
BUN: 10 mg/dL (ref 7–25)
CO2: 26 mmol/L (ref 20–32)
Calcium: 8.7 mg/dL (ref 8.6–10.3)
Chloride: 102 mmol/L (ref 98–110)
Creat: 0.96 mg/dL (ref 0.60–1.35)
Globulin: 4.1 g/dL (calc) — ABNORMAL HIGH (ref 1.9–3.7)
Glucose, Bld: 105 mg/dL (ref 65–139)
Potassium: 4.4 mmol/L (ref 3.5–5.3)
Sodium: 136 mmol/L (ref 135–146)
Total Bilirubin: 6.3 mg/dL — ABNORMAL HIGH (ref 0.2–1.2)
Total Protein: 7.4 g/dL (ref 6.1–8.1)

## 2020-03-16 LAB — PROTIME-INR
INR: 1.5 — ABNORMAL HIGH
Prothrombin Time: 15.1 s — ABNORMAL HIGH (ref 9.0–11.5)

## 2020-03-16 LAB — APTT: aPTT: 34 s — ABNORMAL HIGH (ref 23–32)

## 2020-04-02 ENCOUNTER — Other Ambulatory Visit: Payer: Self-pay | Admitting: Gastroenterology

## 2020-04-03 ENCOUNTER — Other Ambulatory Visit: Payer: Self-pay | Admitting: Gastroenterology

## 2020-04-06 ENCOUNTER — Other Ambulatory Visit: Payer: Self-pay | Admitting: Sports Medicine

## 2020-04-06 DIAGNOSIS — F32A Depression, unspecified: Secondary | ICD-10-CM

## 2020-04-06 DIAGNOSIS — F419 Anxiety disorder, unspecified: Secondary | ICD-10-CM

## 2020-04-13 ENCOUNTER — Encounter: Payer: Self-pay | Admitting: Gastroenterology

## 2020-04-13 ENCOUNTER — Ambulatory Visit (INDEPENDENT_AMBULATORY_CARE_PROVIDER_SITE_OTHER): Payer: Commercial Managed Care - PPO | Admitting: Gastroenterology

## 2020-04-13 ENCOUNTER — Other Ambulatory Visit (INDEPENDENT_AMBULATORY_CARE_PROVIDER_SITE_OTHER): Payer: Commercial Managed Care - PPO

## 2020-04-13 VITALS — BP 130/76 | HR 80 | Ht 66.0 in | Wt 149.8 lb

## 2020-04-13 DIAGNOSIS — K7290 Hepatic failure, unspecified without coma: Secondary | ICD-10-CM | POA: Diagnosis not present

## 2020-04-13 DIAGNOSIS — D696 Thrombocytopenia, unspecified: Secondary | ICD-10-CM

## 2020-04-13 DIAGNOSIS — K7682 Hepatic encephalopathy: Secondary | ICD-10-CM

## 2020-04-13 DIAGNOSIS — E871 Hypo-osmolality and hyponatremia: Secondary | ICD-10-CM

## 2020-04-13 DIAGNOSIS — K759 Inflammatory liver disease, unspecified: Secondary | ICD-10-CM | POA: Diagnosis not present

## 2020-04-13 DIAGNOSIS — I8510 Secondary esophageal varices without bleeding: Secondary | ICD-10-CM

## 2020-04-13 DIAGNOSIS — D689 Coagulation defect, unspecified: Secondary | ICD-10-CM

## 2020-04-13 DIAGNOSIS — K7011 Alcoholic hepatitis with ascites: Secondary | ICD-10-CM | POA: Diagnosis not present

## 2020-04-13 DIAGNOSIS — K766 Portal hypertension: Secondary | ICD-10-CM

## 2020-04-13 DIAGNOSIS — K3189 Other diseases of stomach and duodenum: Secondary | ICD-10-CM

## 2020-04-13 LAB — COMPREHENSIVE METABOLIC PANEL
ALT: 28 U/L (ref 0–53)
AST: 45 U/L — ABNORMAL HIGH (ref 0–37)
Albumin: 3.2 g/dL — ABNORMAL LOW (ref 3.5–5.2)
Alkaline Phosphatase: 165 U/L — ABNORMAL HIGH (ref 39–117)
BUN: 7 mg/dL (ref 6–23)
CO2: 27 mEq/L (ref 19–32)
Calcium: 8.8 mg/dL (ref 8.4–10.5)
Chloride: 99 mEq/L (ref 96–112)
Creatinine, Ser: 0.94 mg/dL (ref 0.40–1.50)
GFR: 102.48 mL/min (ref >60.00)
Glucose, Bld: 163 mg/dL — ABNORMAL HIGH (ref 70–99)
Potassium: 3.6 mEq/L (ref 3.5–5.1)
Sodium: 134 mEq/L — ABNORMAL LOW (ref 135–145)
Total Bilirubin: 7.9 mg/dL — ABNORMAL HIGH (ref 0.2–1.2)
Total Protein: 7.7 g/dL (ref 6.0–8.3)

## 2020-04-13 LAB — PROTIME-INR
INR: 1.8 ratio — ABNORMAL HIGH (ref 0.8–1.0)
Prothrombin Time: 19.8 s — ABNORMAL HIGH (ref 9.6–13.1)

## 2020-04-13 LAB — CBC WITH DIFFERENTIAL/PLATELET
Basophils Absolute: 0.1 10*3/uL (ref 0.0–0.1)
Basophils Relative: 0.8 % (ref 0.0–3.0)
Eosinophils Absolute: 0.3 10*3/uL (ref 0.0–0.7)
Eosinophils Relative: 3.4 % (ref 0.0–5.0)
HCT: 33.8 % — ABNORMAL LOW (ref 39.0–52.0)
Hemoglobin: 11.9 g/dL — ABNORMAL LOW (ref 13.0–17.0)
Lymphocytes Relative: 16.6 % (ref 12.0–46.0)
Lymphs Abs: 1.3 10*3/uL (ref 0.7–4.0)
MCHC: 35.3 g/dL (ref 30.0–36.0)
MCV: 110 fl — ABNORMAL HIGH (ref 78.0–100.0)
Monocytes Absolute: 1.1 10*3/uL — ABNORMAL HIGH (ref 0.1–1.0)
Monocytes Relative: 13.4 % — ABNORMAL HIGH (ref 3.0–12.0)
Neutro Abs: 5.4 10*3/uL (ref 1.4–7.7)
Neutrophils Relative %: 65.8 % (ref 43.0–77.0)
Platelets: 134 10*3/uL — ABNORMAL LOW (ref 150.0–400.0)
RBC: 3.08 Mil/uL — ABNORMAL LOW (ref 4.22–5.81)
RDW: 16.4 % — ABNORMAL HIGH (ref 11.5–15.5)
WBC: 8.1 10*3/uL (ref 4.0–10.5)

## 2020-04-13 NOTE — Progress Notes (Signed)
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  He seems to be doing well overall, attending his clinic visits and taking medicines as directed. Recent upper endoscopy revealed grade 2 esophageal varices and nonbleeding portal gastropathy, after which he was started on the nadolol 20 mg daily. He has had intermittent brief lightheadedness while standing, and his wife got a blood pressure machine and often finds his blood pressure about 100/60.  He has been gaining weight despite no change in abdominal girth or increase in peripheral edema.  He will sometimes have fluid in the ankles or lower legs at the end of the day, it improves by morning.  They have also been diligent about 2 g daily sodium restriction Some outstanding lab issues as noted below  Actin antibody mildly elevated at 22, which was also the case in August 2019.  IgG elevated at 2221 in October 2021 (upper limit of normal 1600)  He denies abdominal pain, and his wife says he has not had episodes of confusion or word finding difficulty.  He still has variable quality sleep but they think the Remeron has helped somewhat. Lastly, his wife feels that he is somewhat more jaundiced lately.  ROS: Cardiovascular:  no chest pain Respiratory: no dyspnea Remainder of systems negative except as above The patient's Past Medical, Family and Social History were reviewed and are on file in the EMR.  Objective:  Med list reviewed  Current Outpatient Medications:  .  escitalopram (LEXAPRO) 10 MG tablet, TAKE 1 TABLET BY MOUTH EVERY DAY, Disp: 90 tablet, Rfl: 2 .  furosemide (LASIX) 20 MG tablet, Take 20 mg by mouth daily., Disp: , Rfl:  .  gabapentin (NEURONTIN) 300 MG capsule, Take 1 capsule (300 mg total) by mouth 2 (two) times daily., Disp: 60 capsule, Rfl: 3 .  hydrOXYzine (ATARAX/VISTARIL) 25  MG tablet, Take 1 tablet (25 mg total) by mouth 2 (two) times daily as needed., Disp: 180 tablet, Rfl: 3 .  lactulose (CHRONULAC) 10 GM/15ML solution, TAKE 45 MLS (30 G TOTAL) BY MOUTH DAILY., Disp: 1350 mL, Rfl: 3 .  mirtazapine (REMERON) 30 MG tablet, TAKE 1 TABLET BY MOUTH AT BEDTIME., Disp: 90 tablet, Rfl: 2 .  pantoprazole (PROTONIX) 40 MG tablet, Take 1 tablet (40 mg total) by mouth daily., Disp: 30 tablet, Rfl: 2 .  spironolactone (ALDACTONE) 100 MG tablet, TAKE 1 TABLET BY MOUTH EVERY DAY, Disp: 90 tablet, Rfl: 0 .  tadalafil (CIALIS) 10 MG tablet, Take 1 tablet (10 mg total) by mouth daily as needed for erectile dysfunction., Disp: 30 tablet, Rfl: 11   Vital signs in last 24 hrs: Vitals:   04/13/20 0832  BP: 130/76  Pulse: 80   Wt Readings from Last 3 Encounters:  04/13/20 149 lb 12.8 oz (67.9 kg)  03/15/20 141 lb (64 kg)  03/10/20 129 lb (58.5 kg)    Physical Exam  Ambulatory, steady on his feet, gets on exam table without difficulty.  Speech fluent, normal orientation  HEENT: sclera icteric, oral mucosa moist without lesions  Neck: supple, no thyromegaly, JVD or lymphadenopathy  Cardiac: RRR without murmurs, S1S2 heard, no peripheral edema  Pulm: clear to auscultation bilaterally, normal RR and effort noted  Abdomen: soft, no tenderness, with active bowel sounds.  No bulging flanks or distention.  Left lobe liver enlarged as before, no spleen tip palpable.  Skin; warm and dry, + jaundice, no rash.  Multiple tattoos Neuro: No asterixis, normal finger-to-nose test Labs:  Labs from [place] [facility] dated 02/23/2020:  Sodium 131, potassium 4.2, chloride 96, bicarb 21, calcium 9.1, phosphorus 4.2, albumin 3.4  Dr. Thedore Mins planned to continue the patient's current diuretic doses and see Zachary Mata in  2 months.   CMP Latest Ref Rng & Units 04/13/2020 03/15/2020 02/03/2020  Glucose 70 - 99 mg/dL 696(E) 952 841(L)  BUN 6 - 23 mg/dL 7 10 13   Creatinine 0.40 - 1.50 mg/dL  2.44 0.10  Sodium 135 - 145 mEq/L 134(L) 136 128(L)  Potassium 3.5 - 5.1 mEq/L 3.6 4.4 4.2  Chloride 96 - 112 mEq/L 99 102 95(L)  CO2 19 - 32 mEq/L 27 26 25   Calcium 8.4 - 10.5 mg/dL 8.8 8.7 9.0  Total Protein 6.0 - 8.3 g/dL 7.7 7.4 7.5  Total Bilirubin 0.2 - 1.2 mg/dL 7.9(H) 6.3(H) 7.3(H)  Alkaline Phos 39 - 117 U/L 165(H) - -  AST 0 - 37 U/L 45(H) 43(H) 47(H)  ALT 0 - 53 U/L 28 38 35  Alkaline phosphatase 161  Last albumin of 2.9 on 01/25/2020  INR 1.5 on 03/15/2020 ___________________________________________ Radiologic studies:   ____________________________________________ Other:   _____________________________________________ Assessment & Plan  Assessment: Encounter Diagnoses  Name Primary?  . Alcoholic hepatitis with ascites Yes  . Encephalopathy, hepatic (HCC)   . Hyponatremia   . Jaundice due to hepatitis   . Secondary esophageal varices without bleeding (HCC)   . Portal hypertensive gastropathy (HCC)   . Coagulopathy (HCC)   . Thrombocytopenia (HCC)   . Hepatic encephalopathy (HCC)    Advanced alcohol-related cirrhosis, initial large volume ascites, now under good control on low-dose diuretics.  Chronic hyponatremia indicates increased renal vascular sensitivity to volume status. Recently discovered nonbleeding esophageal varices, for which I started primary prophylaxis with nonselective beta-blocker.  I have concerns that it is affecting his blood pressure (even with a normal value today that I rechecked), since his wife has been checking it with a machine at home.  Also, we have to proceed with caution using beta-blockers with his kidneys demonstrating volume sensitivity. Hepatic encephalopathy under control on low-dose lactulose.  Weight gain that is not accounted for by volume overload, possibly effect of Remeron.  They are due to see primary care in about a month, and I suggested a message that provider to inquire about it. He is due to see nephrology  sometime next week and we will send a copy of this note as well.  Lastly, he had a mildly elevated actin antibody along with elevated IgG which might not be AIH, but I would like hepatology opinion on whether liver biopsy is warranted.  Plan: Update meld score labs today, repeat actin antibody and draw screening AFP.  He is up-to-date on screening ultrasound  Referred to [facility] hepatology clinic with particular questions of long-term management of varices (primary beta-blocker prophylaxis versus elective banding).  Since he has had symptoms of orthostasis, I recommended the hold the nadolol for now pending that clinic consult. The other questions for that clinic are whether or not to proceed with liver biopsy out of concern for possible autoimmune hepatitis and establishing relationship in case this patient needs to be considered for liver transplant at some point. Follow-up with me to be determined based on lab results and office consult received from hepatology clinic.  40 minutes were spent on this encounter (including chart review, history/exam, counseling/coordination of  care, and documentation) > 50% of that time was spent on counseling and coordination of care.  Topics discussed included: Long-term cirrhosis management with his complications, rationale for hepatology clinic consult and hold on the nadolol.  Charlie Pitter III

## 2020-04-13 NOTE — Patient Instructions (Signed)
If you are age 39 or older, your body mass index should be between 23-30. Your Body mass index is 24.18 kg/m. If this is out of the aforementioned range listed, please consider follow up with your Primary Care Provider.  If you are age 29 or younger, your body mass index should be between 19-25. Your Body mass index is 24.18 kg/m. If this is out of the aformentioned range listed, please consider follow up with your Primary Care Provider.   Your provider has requested that you go to the basement level for lab work before leaving today. Press "B" on the elevator. The lab is located at the first door on the left as you exit the elevator.  STOP taking your Nadolol.  We are going to be setting you up with a referral to [facility] They will contact you to schedule an appointment.  It was a pleasure to see you today!  Dr. Myrtie Neither

## 2020-04-16 LAB — ANTI-SMOOTH MUSCLE ANTIBODY, IGG: Actin (Smooth Muscle) Antibody (IGG): <20 U (ref <20)

## 2020-04-16 LAB — AFP TUMOR MARKER: AFP-Tumor Marker: 8.6 ng/mL — ABNORMAL HIGH (ref <6.1)

## 2020-04-18 ENCOUNTER — Other Ambulatory Visit: Payer: Self-pay

## 2020-04-18 DIAGNOSIS — R772 Abnormality of alphafetoprotein: Secondary | ICD-10-CM

## 2020-04-18 DIAGNOSIS — K7460 Unspecified cirrhosis of liver: Secondary | ICD-10-CM

## 2020-04-25 ENCOUNTER — Ambulatory Visit (INDEPENDENT_AMBULATORY_CARE_PROVIDER_SITE_OTHER): Payer: Commercial Managed Care - PPO | Admitting: Sports Medicine

## 2020-04-25 ENCOUNTER — Other Ambulatory Visit: Payer: Self-pay

## 2020-04-25 DIAGNOSIS — F32A Depression, unspecified: Secondary | ICD-10-CM | POA: Diagnosis not present

## 2020-04-25 DIAGNOSIS — N139 Obstructive and reflux uropathy, unspecified: Secondary | ICD-10-CM | POA: Diagnosis not present

## 2020-04-25 DIAGNOSIS — F419 Anxiety disorder, unspecified: Secondary | ICD-10-CM | POA: Diagnosis not present

## 2020-04-25 DIAGNOSIS — I10 Essential (primary) hypertension: Secondary | ICD-10-CM

## 2020-04-25 MED ORDER — ESCITALOPRAM OXALATE 20 MG PO TABS
20.0000 mg | ORAL_TABLET | Freq: Every day | ORAL | 3 refills | Status: DC
Start: 2020-04-25 — End: 2020-05-30

## 2020-04-25 MED ORDER — DIPHENHYDRAMINE HCL 25 MG PO CAPS: 25.0000 mg | ORAL_CAPSULE | Freq: Every evening | ORAL | 3 refills | Status: DC | PRN

## 2020-04-25 MED ORDER — MIRTAZAPINE 15 MG PO TABS: 15.0000 mg | ORAL_TABLET | Freq: Every day | ORAL | 3 refills | Status: DC

## 2020-04-25 NOTE — Progress Notes (Signed)
    Procedures performed today:    None.  Independent interpretation of notes and tests performed by another provider:   None.  Brief History, Exam, Impression, and Recommendations:    Anxiety and depression Axis III factors contributing to depression and anxiety, initially improved with mirtazapine, Lexapro. We will drop his mirtazapine down to 15 as he has gained a great deal of weight, we also will increase his Lexapro to 20 mg as I do not really have excellent control over his anxiety. Discontinue hydroxyzine, does not seem to be working and we will use Benadryl at night to help him sleep. I do think the insomnia is simply a byproduct of primary anxiety.  Obstructive uropathy Checking a urinalysis.   He will likely need this forwarded to his nephrologist.    ___________________________________________ Zachary Mata. Benjamin Stain, M.D., ABFM., CAQSM. Primary Care and Sports Medicine [facility]  Adjunct Instructor of Family Medicine  [employer]

## 2020-04-25 NOTE — Assessment & Plan Note (Signed)
Axis III factors contributing to depression and anxiety, initially improved with mirtazapine, Lexapro. We will drop his mirtazapine down to 15 as he has gained a great deal of weight, we also will increase his Lexapro to 20 mg as I do not really have excellent control over his anxiety. Discontinue hydroxyzine, does not seem to be working and we will use Benadryl at night to help him sleep. I do think the insomnia is simply a byproduct of primary anxiety.

## 2020-04-25 NOTE — Assessment & Plan Note (Signed)
Checking a urinalysis.   He will likely need this forwarded to his nephrologist.

## 2020-04-26 LAB — URINALYSIS W MICROSCOPIC + REFLEX CULTURE
Bacteria, UA: NONE SEEN /HPF
Bilirubin Urine: NEGATIVE
Glucose, UA: NEGATIVE
Hgb urine dipstick: NEGATIVE
Hyaline Cast: NONE SEEN /LPF
Ketones, ur: NEGATIVE
Leukocyte Esterase: NEGATIVE
Nitrites, Initial: NEGATIVE
Protein, ur: NEGATIVE
Specific Gravity, Urine: 1.007 (ref 1.001–1.03)
Squamous Epithelial / HPF: NONE SEEN /HPF (ref <=5)
WBC, UA: NONE SEEN /HPF (ref 0–5)
pH: 7 (ref 5.0–8.0)

## 2020-04-26 LAB — NO CULTURE INDICATED

## 2020-04-27 ENCOUNTER — Other Ambulatory Visit: Payer: Self-pay

## 2020-04-27 ENCOUNTER — Ambulatory Visit (INDEPENDENT_AMBULATORY_CARE_PROVIDER_SITE_OTHER)
Admission: RE | Admit: 2020-04-27 | Discharge: 2020-04-27 | Disposition: A | Discharge status: Home / Self Care | Payer: Commercial Managed Care - PPO | Source: Ambulatory Visit | Attending: Gastroenterology | Admitting: Gastroenterology

## 2020-04-27 ENCOUNTER — Other Ambulatory Visit: Payer: Commercial Managed Care - PPO

## 2020-04-27 ENCOUNTER — Inpatient Hospital Stay: Admission: RE | Admit: 2020-04-27 | Payer: Commercial Managed Care - PPO | Source: Ambulatory Visit

## 2020-04-27 DIAGNOSIS — K7460 Unspecified cirrhosis of liver: Secondary | ICD-10-CM | POA: Diagnosis not present

## 2020-04-27 DIAGNOSIS — R772 Abnormality of alphafetoprotein: Secondary | ICD-10-CM

## 2020-04-27 IMAGING — CT CT ABDOMEN WO/W CM
3 of 13 series · 10 of 46 positions shown, 16 images · IV contrast (OMNIPAQUE 300)
Comparison: [DATE].

CLINICAL DATA: Hepatic cirrhosis.  Elevated AFP.

EXAM:
CT ABDOMEN WITHOUT AND WITH CONTRAST
TECHNIQUE: Multidetector CT imaging of the abdomen was performed following the
standard protocol before and following the bolus administration of
intravenous contrast.
CONTRAST:  100mL OMNIPAQUE IOHEXOL 300 MG/ML  SOLN

[Series 4: arterial phase 3.0 br38 · axial · arterial · 0.64mm/px · z∈[-467,-317]mm · 4 of 84 slices shown]
[im 17/84  soft-tissue]
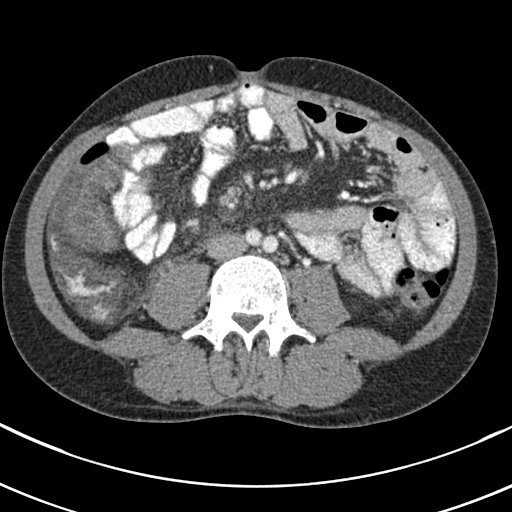
[im 34/84  soft-tissue]
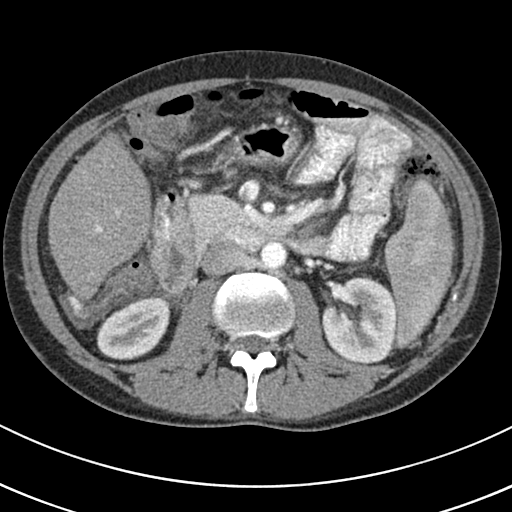
[im 50/84  soft-tissue]
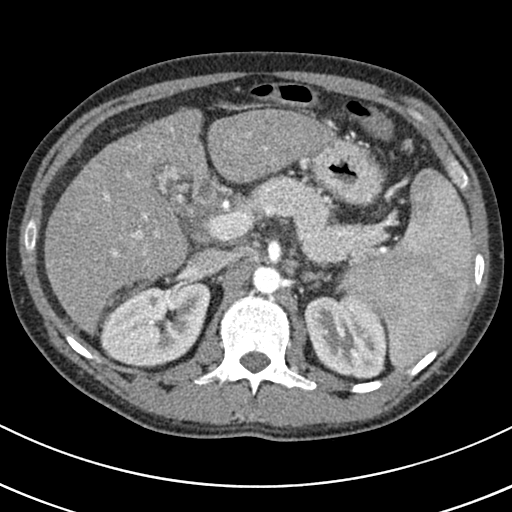
[im 67/84  soft-tissue]
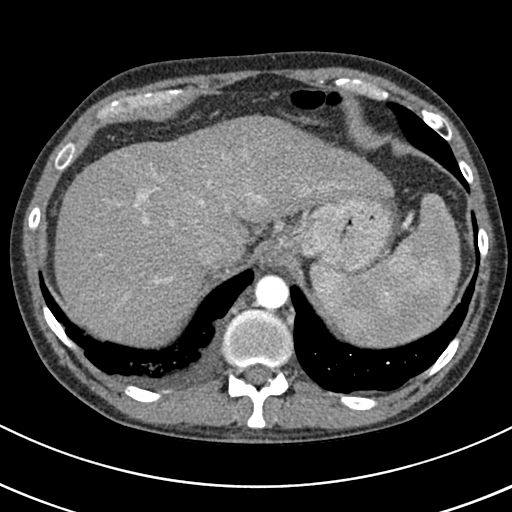

[Series 9: venous phase 3.0 br38 · axial · portal-venous · 0.64mm/px · z∈[-467,-317]mm · 4 of 84 slices shown, 9 images]
[im 17/84  soft-tissue]
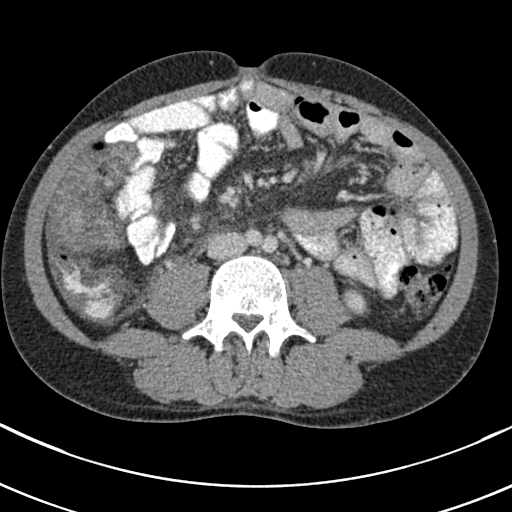
[im 17/84  lung]
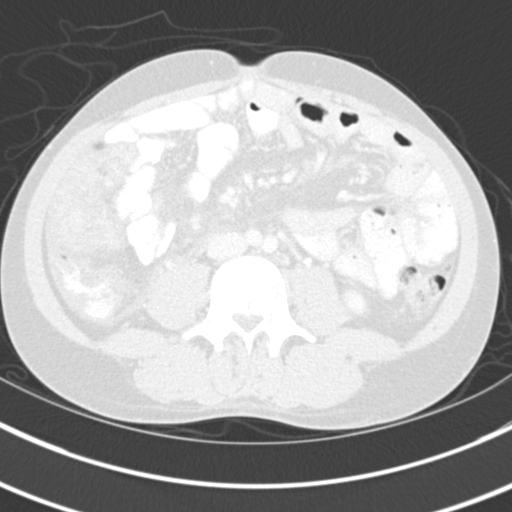
[im 17/84  bone]
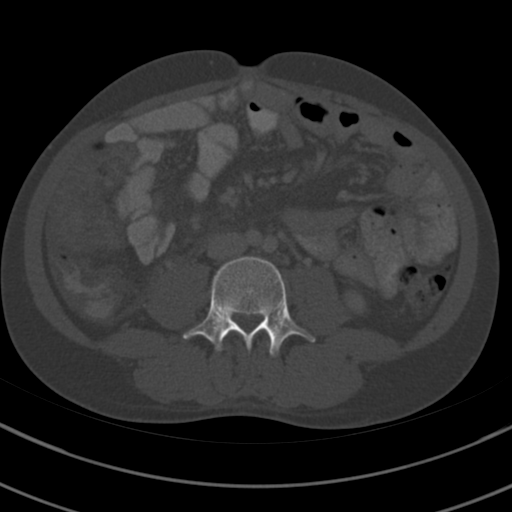
[im 34/84  soft-tissue]
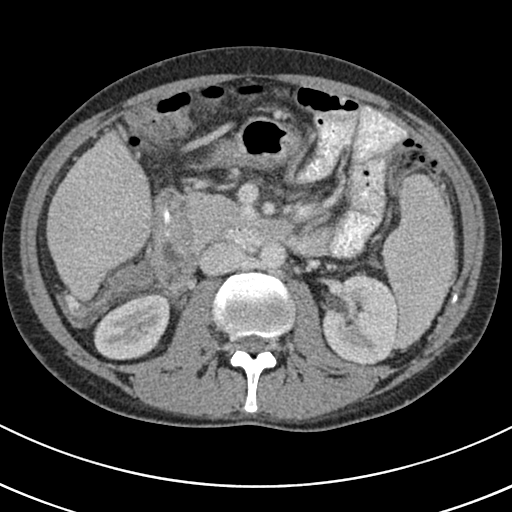
[im 34/84  lung]
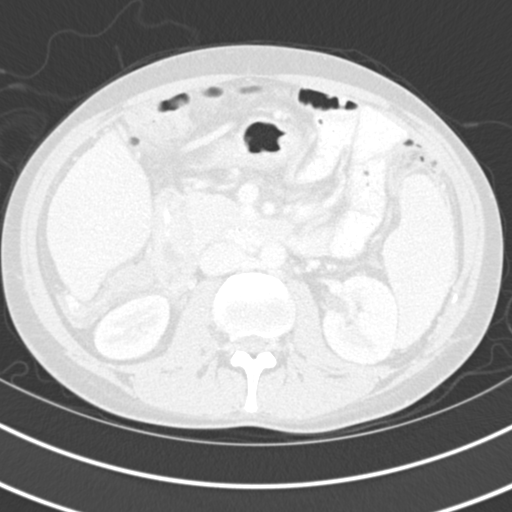
[im 50/84  soft-tissue]
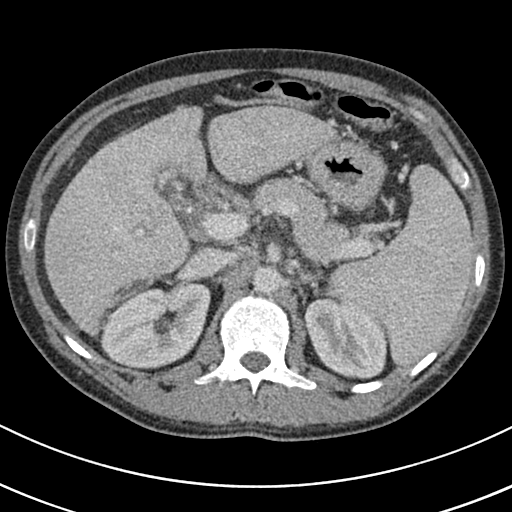
[im 50/84  lung]
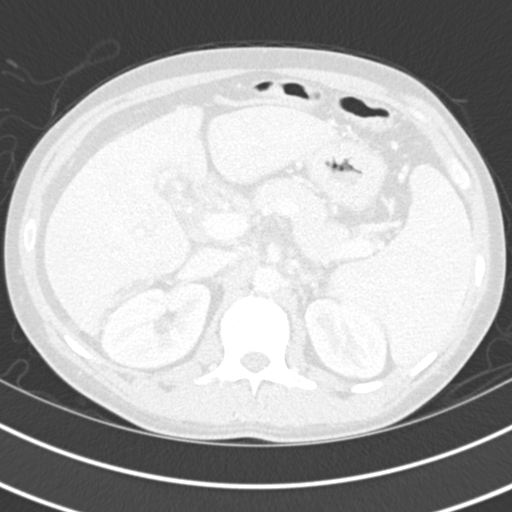
[im 67/84  soft-tissue]
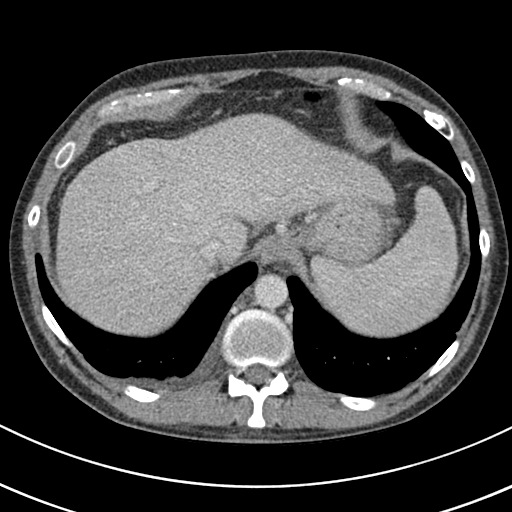
[im 67/84  lung]
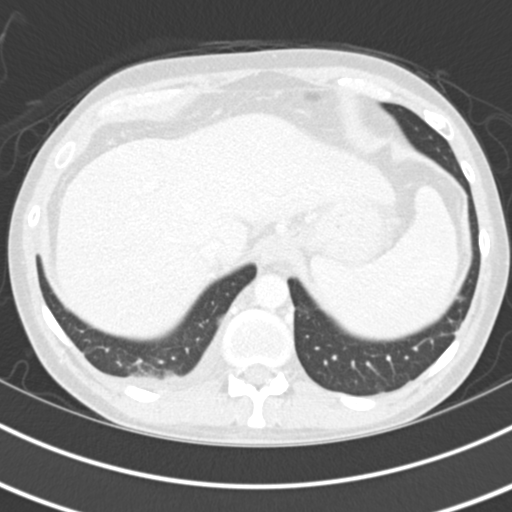

[Series 12: venous coronal · coronal · portal-venous · 0.49mm/px · 2 of 146 slices shown, 3 images]
[im 49/146  soft-tissue]
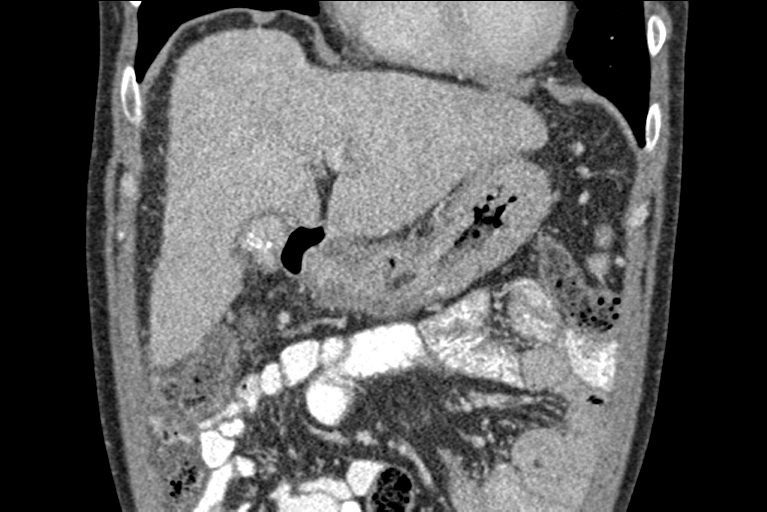
[im 49/146  bone]
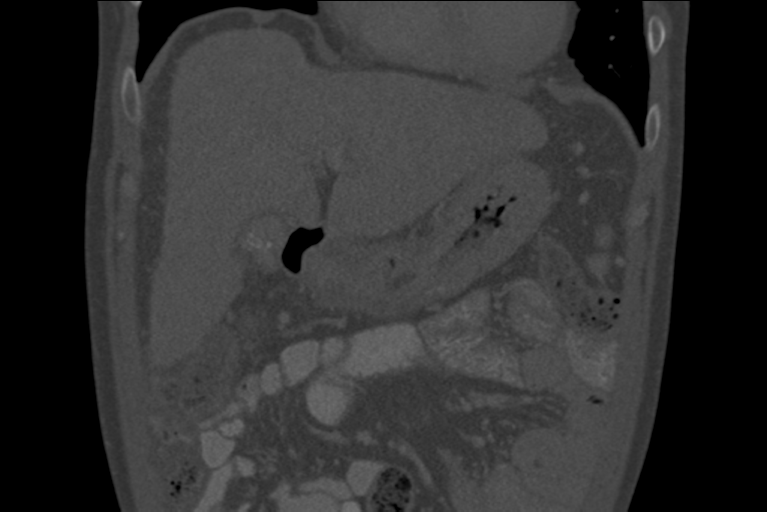
[im 97/146  soft-tissue]
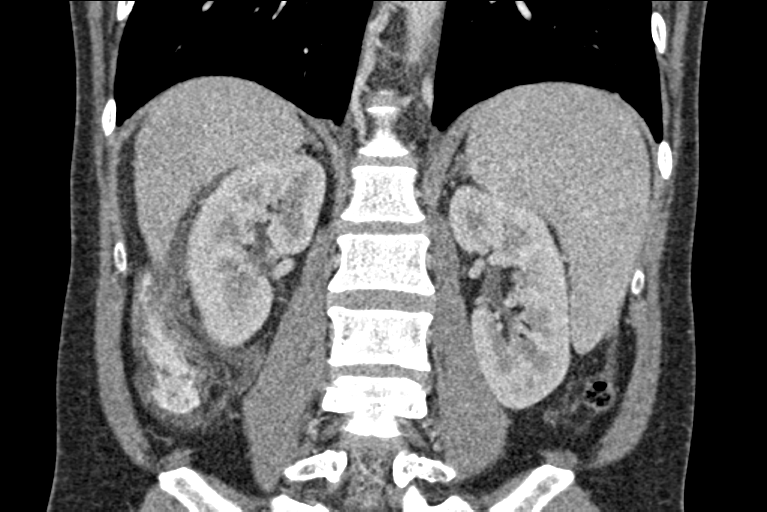

[10 of 46 positions shown; findings below may reference images not displayed]

FINDINGS: Lower chest: Unremarkable

Hepatobiliary: No arterial phase hyperenhancement in the liver
parenchyma. Calcified gallstones evident. There is marked
enhancement of the gallbladder after IV contrast administration.
Given the decompressed state of the gallbladder, this may be related
to gallbladder wall or prominent vascularity. No intrahepatic or
extrahepatic biliary dilation.

Pancreas: No focal mass lesion. No dilatation of the main duct. No
intraparenchymal cyst. No peripancreatic edema.

Spleen: Spleen is 16.1 cm craniocaudal length.

Adrenals/Urinary Tract: No adrenal nodule or mass. Kidneys
unremarkable.

Stomach/Bowel: Stomach is unremarkable. No gastric wall thickening.
No evidence of outlet obstruction. Duodenum is normally positioned
as is the ligament of Treitz. No small bowel or colonic dilatation
within the visualized abdomen. Colon is decompressed, accentuating
wall thickness.

Vascular/Lymphatic: No abdominal aortic aneurysm. Portal vein,
superior mesenteric vein, and splenic vein are patent. There is no
gastrohepatic or hepatoduodenal ligament lymphadenopathy. No
retroperitoneal or mesenteric lymphadenopathy.

Other: Trace free fluid noted right paracolic gutter.

Musculoskeletal: No worrisome lytic or sclerotic osseous
abnormality.
IMPRESSION: 1. No focal mass lesion in the liver. No arterial phase
hyperenhancement.
2. Marked diffuse enhancement of the decompressed gallbladder,
potentially related to prominent vascularity. This is abnormal. As
elevated AFP has been described in the setting of gallbladder
carcinoma, close follow-up recommended. MRI abdomen with and without
contrast may prove helpful to further evaluate.
3. Liver contour is not substantially nodular in this patient with a
reported history of cirrhosis.
4. Splenomegaly.

## 2020-04-27 MED ORDER — IOHEXOL 300 MG/ML  SOLN
100.0000 mL | Freq: Once | INTRAMUSCULAR | Status: AC | PRN
  Administered 2020-04-27: 100 mL via INTRAVENOUS

## 2020-05-02 ENCOUNTER — Telehealth: Payer: Commercial Managed Care - PPO | Admitting: Gastroenterology

## 2020-05-02 NOTE — Telephone Encounter (Signed)
This patient was supposed to be referred to the [facility] hepatology clinic after his recent office visit with me. Please send another referral to that practice and I will call the provider that clinic to have them check up on it as well.  Regarding the CT, this was done because of his cirrhosis and an elevated AFP lab. Fortunately, no liver mass was seen.  The radiologist describes the gallbladder wall as being significantly thickened, and this may just be because the gallbladder is contracted, and I suspect it is largely related to his cirrhosis. They recommend that an MRI would possibly give more information about that, but I am uncertain if it would do so. Given this description of the gallbladder and his gallstones (which have been asymptomatic), my advice is that we refer him to see a hepatobiliary surgeon.  I would like to know from them if further imaging of the gallbladder is likely to be any more revealing and whether or not the gallbladder should be removed. Please send a referral to Dr. Sophronia Simas at [facility].  - HD

## 2020-05-02 NOTE — Telephone Encounter (Signed)
Patients wife calling to get CT results and to advise they have not heard from the referral.

## 2020-05-02 NOTE — Telephone Encounter (Signed)
Dr. Myrtie Neither, patient calling for the results of the CT.  Please review at your next convenience.

## 2020-05-02 NOTE — Telephone Encounter (Signed)
Patient and wife updated on the results and new recommendations. Referral sent to [facility].  Wife asked that she be the primary contact to schedule office visit with [facility] at [phone].  Added to paperwork and referral. She is aware we will also contact [facility] to follow up on referral.    I spoke with Retia Passe, CMA and she will follow up with [facility] on referral

## 2020-05-12 ENCOUNTER — Other Ambulatory Visit: Payer: Self-pay | Admitting: Surgery

## 2020-05-12 DIAGNOSIS — K7030 Alcoholic cirrhosis of liver without ascites: Secondary | ICD-10-CM

## 2020-05-30 ENCOUNTER — Other Ambulatory Visit: Payer: Self-pay | Admitting: Sports Medicine

## 2020-05-30 DIAGNOSIS — F32A Depression, unspecified: Secondary | ICD-10-CM

## 2020-05-30 DIAGNOSIS — F419 Anxiety disorder, unspecified: Secondary | ICD-10-CM

## 2020-06-01 ENCOUNTER — Other Ambulatory Visit: Payer: Self-pay

## 2020-06-01 ENCOUNTER — Ambulatory Visit
Admission: RE | Admit: 2020-06-01 | Discharge: 2020-06-01 | Disposition: A | Discharge status: Home / Self Care | Payer: Commercial Managed Care - PPO | Source: Ambulatory Visit | Attending: Surgery | Admitting: Surgery

## 2020-06-01 DIAGNOSIS — K7030 Alcoholic cirrhosis of liver without ascites: Secondary | ICD-10-CM

## 2020-06-01 IMAGING — MR MR ABDOMEN WO/W CM MRCP
12 of 18 series · 32 of 48 positions shown · IV contrast (multihance)
Comparison: Multiple exams, including [DATE]

CLINICAL DATA: Hepatic cirrhosis. Accentuated gallbladder
enhancement on [DATE]

EXAM:
MRI ABDOMEN WITHOUT AND WITH CONTRAST (INCLUDING MRCP)
TECHNIQUE: Multiplanar multisequence MR imaging of the abdomen was performed
both before and after the administration of intravenous contrast.
Heavily T2-weighted images of the biliary and pancreatic ducts were
obtained, and three-dimensional MRCP images were rendered by post
processing.
CONTRAST:  13mL MULTIHANCE GADOBENATE DIMEGLUMINE 529 MG/ML IV SOLN

[Series 3: T2 · coronal · 5.0mm · 1.56mm/px · 2 of 35 slices shown (1 of 3)]
[im 1/35]
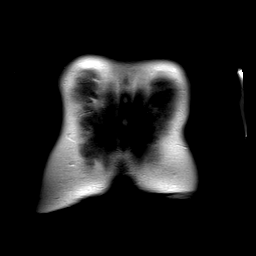
[im 35/35]
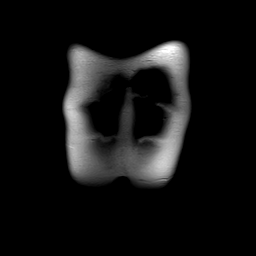

[Series 4: T2 · axial · 6.0mm · 1.56mm/px · z∈[-192,+49]mm · 2 of 36 slices shown (2 of 3)]
[im 1/36]
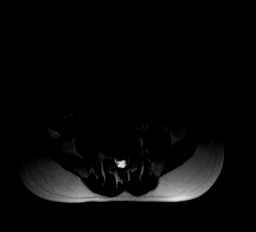
[im 36/36]
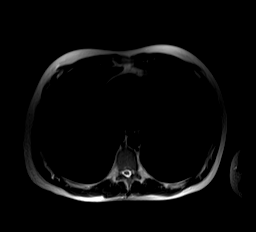

[Series 8: T2 · axial · 6.0mm · 0.78mm/px · z∈[-121,+121]mm · 2 of 36 slices shown (3 of 3)]
[im 1/36]
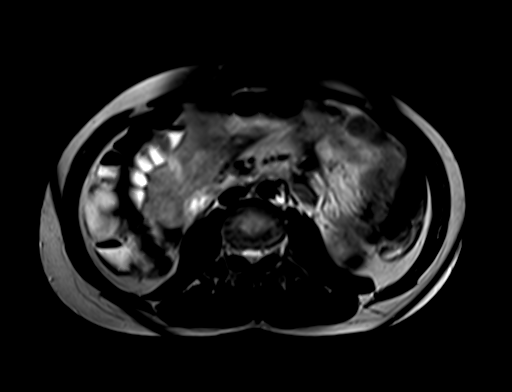
[im 36/36]
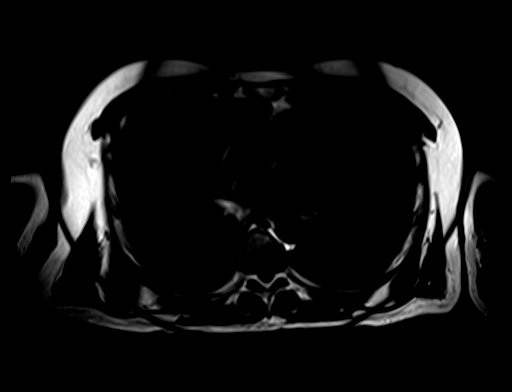

[Series 12: ep2d_diff_b50_500_800_p2 · axial · 6.0mm · 1.98mm/px · z∈[-133,+136]mm · 4 of 118 slices shown]
[im 1/118]
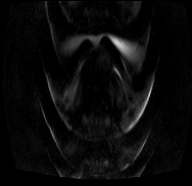
[im 40/118]
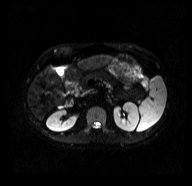
[im 79/118]
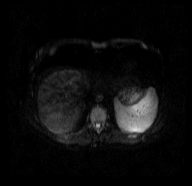
[im 118/118]
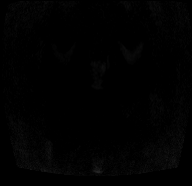

[Series 13: ep2d_[id number]_500_[id number]_adc · axial · 6.0mm · 1.98mm/px · z∈[-133,+136]mm · 2 of 40 slices shown]
[im 1/40]
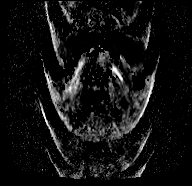
[im 40/40]
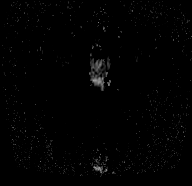

[Series 14: axial tru fisp · axial · 5.0mm · 1.56mm/px · z∈[-190,+61]mm · 2 of 43 slices shown]
[im 1/43]
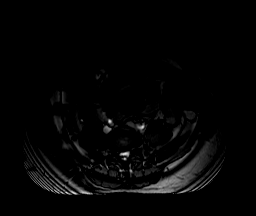
[im 43/43]
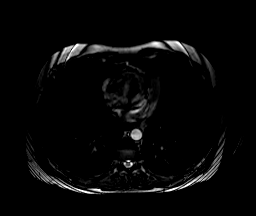

[Series 15: axial in out · axial · 6.0mm · 0.74mm/px · z∈[-182,+53]mm · 3 of 70 slices shown]
[im 1/70]
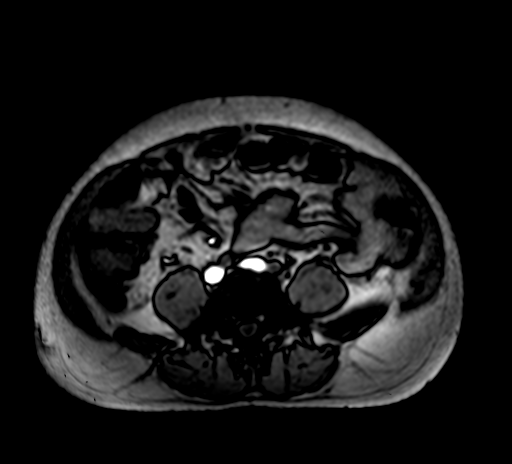
[im 35/70]
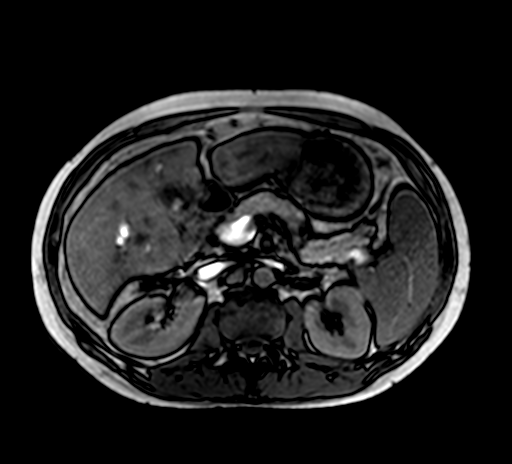
[im 70/70]
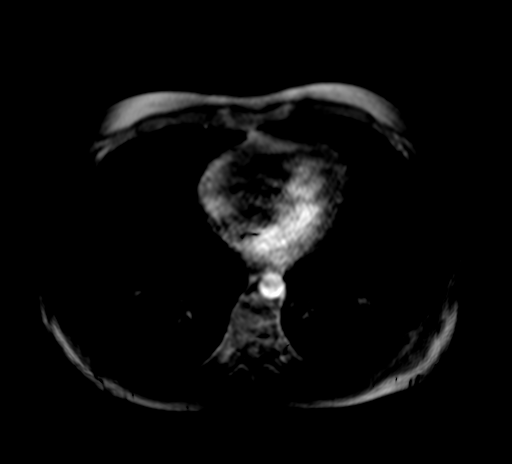

[Series 16: T1 dynamic · axial · non-contrast · 2.5mm · 0.70mm/px · z∈[-175,+42]mm · 3 of 88 slices shown]
[im 1/88]
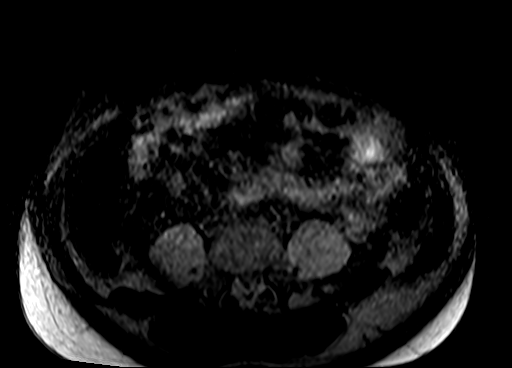
[im 44/88]
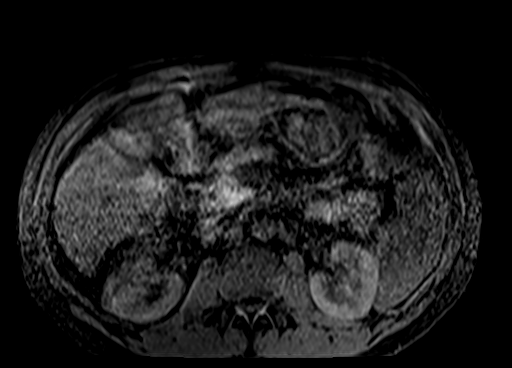
[im 88/88]
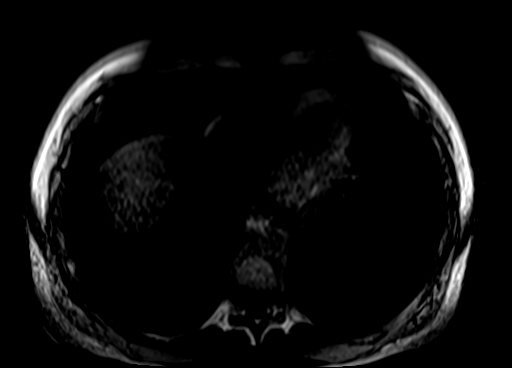

[Series 17: post 25 sec · axial · 2.5mm · 0.70mm/px · z∈[-175,+42]mm · 3 of 88 slices shown]
[im 1/88]
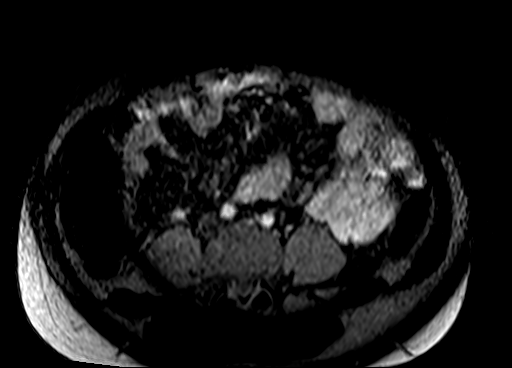
[im 44/88]
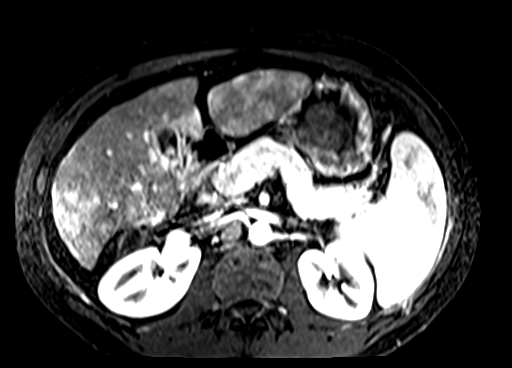
[im 88/88]
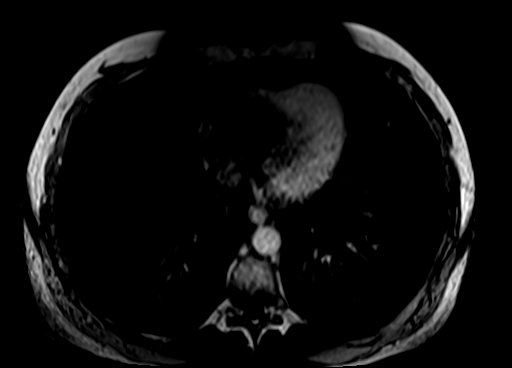

[Series 18: post 25 sec_sub · axial · 2.5mm · 0.70mm/px · z∈[-175,+42]mm · 3 of 88 slices shown]
[im 1/88]
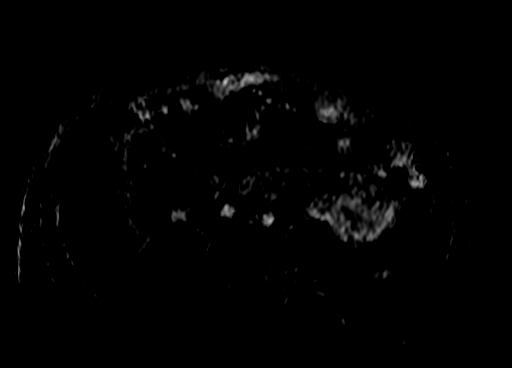
[im 44/88]
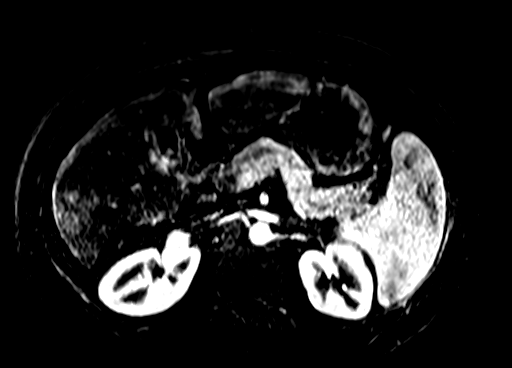
[im 88/88]
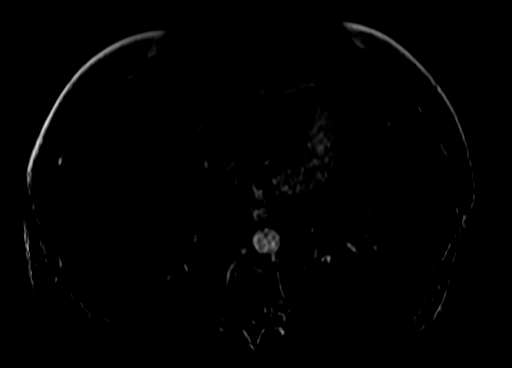

[Series 19: post 45 sec · axial · 2.5mm · 0.70mm/px · z∈[-175,+42]mm · 3 of 88 slices shown]
[im 1/88]
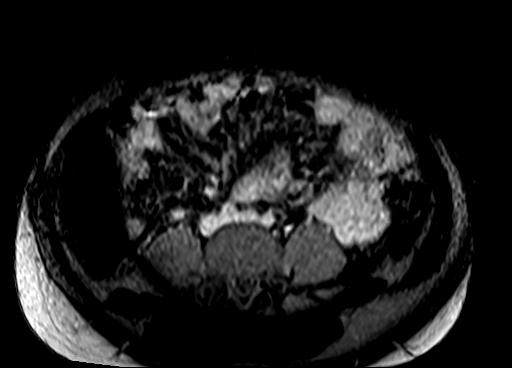
[im 44/88]
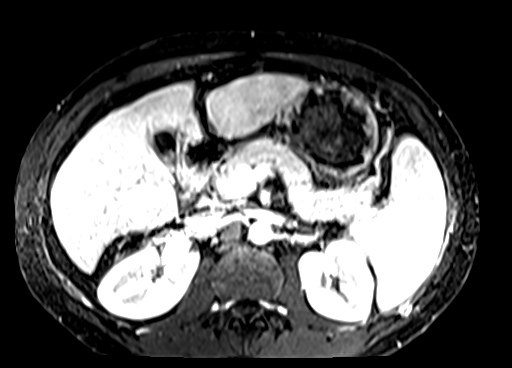
[im 88/88]
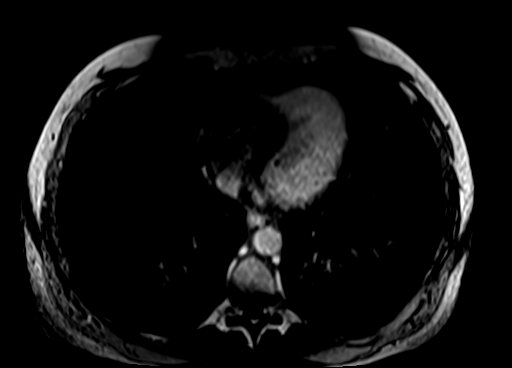

[Series 20: post 45 sec_sub · axial · 2.5mm · 0.70mm/px · z∈[-175,+42]mm · 3 of 88 slices shown]
[im 1/88]
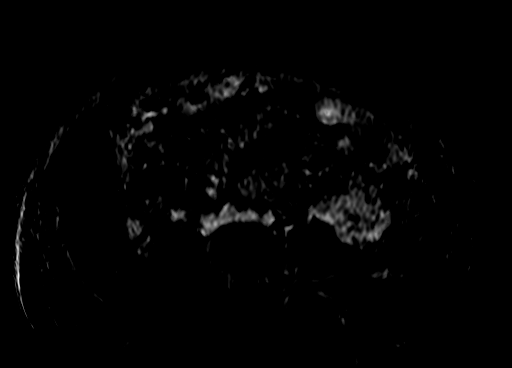
[im 44/88]
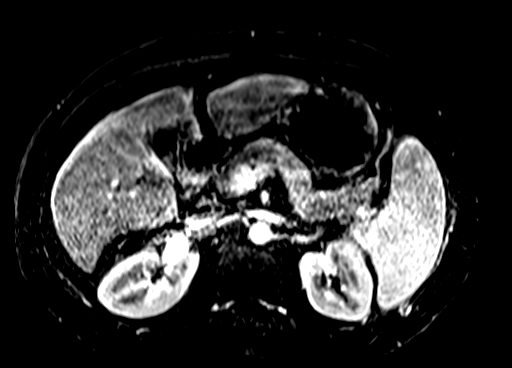
[im 88/88]
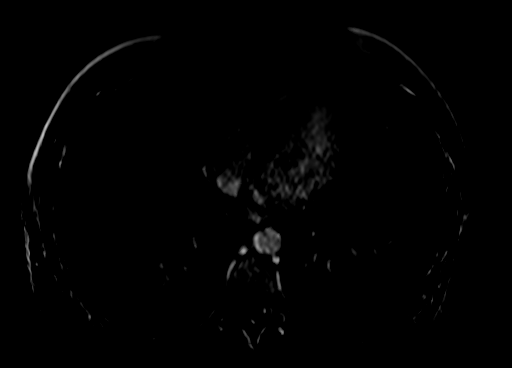

[32 of 48 positions shown; findings below may reference images not displayed]

FINDINGS: Lower chest: Small uphill paraesophageal varices.

Hepatobiliary: Mild T2 heterogeneity throughout the liver prior to
contrast administration. With contrast there is patchy heterogeneous
arterial phase enhancement primarily along the periphery of the
liver, fading to near isointensity on portal venous and delayed
phase images, probably a manifestation of hepatitis. I do not
observe typical delayed phase hypoenhancement of hepatocellular
carcinoma. Unfortunately the severity of motion artifact on the
precontrast series makes the subtraction images less useful. Low T2
signal intensity filling defect in the gallbladder for example on
image 21 of series 8 favors gallstone. Mild gallbladder wall
thickening with diffuse enhancement of the gallbladder wall. I not
cm masslike region of enhancement in the gallbladder and no biliary
dilatation is evident.

Pancreas:  Unremarkable

Spleen: The spleen measures 11.1 by 7.2 by 14.9 cm (volume = 620
cm^3), compatible splenomegaly.

Adrenals/Urinary Tract:  Unremarkable

Stomach/Bowel: Unremarkable

Vascular/Lymphatic:  Patent portal vein and splenic vein.

Other:  No supplemental non-categorized findings.

Musculoskeletal: Unremarkable
IMPRESSION: 1. Heterogeneous early arterial phase enhancement in the liver
probably a manifestation of hepatitis in the setting of cirrhosis.
2. Cholelithiasis. Accentuated gallbladder wall thickening diffusely
suggesting low-grade chronic cholecystitis.
3. Splenomegaly.
4. Small uphill paraesophageal varices.

## 2020-06-01 MED ORDER — GADOBENATE DIMEGLUMINE 529 MG/ML IV SOLN
13.0000 mL | Freq: Once | INTRAVENOUS | Status: AC | PRN
  Administered 2020-06-01: 13 mL via INTRAVENOUS

## 2020-06-04 ENCOUNTER — Other Ambulatory Visit: Payer: Self-pay | Admitting: Gastroenterology

## 2020-06-06 ENCOUNTER — Ambulatory Visit (INDEPENDENT_AMBULATORY_CARE_PROVIDER_SITE_OTHER): Payer: Commercial Managed Care - PPO | Admitting: Sports Medicine

## 2020-06-06 ENCOUNTER — Other Ambulatory Visit: Payer: Self-pay

## 2020-06-06 ENCOUNTER — Encounter: Payer: Self-pay | Admitting: Sports Medicine

## 2020-06-06 DIAGNOSIS — F32A Depression, unspecified: Secondary | ICD-10-CM

## 2020-06-06 DIAGNOSIS — H1013 Acute atopic conjunctivitis, bilateral: Secondary | ICD-10-CM | POA: Diagnosis not present

## 2020-06-06 DIAGNOSIS — F419 Anxiety disorder, unspecified: Secondary | ICD-10-CM

## 2020-06-06 DIAGNOSIS — J309 Allergic rhinitis, unspecified: Secondary | ICD-10-CM

## 2020-06-06 DIAGNOSIS — K7030 Alcoholic cirrhosis of liver without ascites: Secondary | ICD-10-CM | POA: Diagnosis not present

## 2020-06-06 MED ORDER — ARIPIPRAZOLE 5 MG PO TABS: 5.0000 mg | ORAL_TABLET | Freq: Every day | ORAL | 3 refills | Status: DC

## 2020-06-06 MED ORDER — SPIRONOLACTONE 100 MG PO TABS: 100.0000 mg | ORAL_TABLET | Freq: Every day | ORAL | 3 refills | Status: DC

## 2020-06-06 MED ORDER — LORATADINE 10 MG PO TABS: 10.0000 mg | ORAL_TABLET | Freq: Every day | ORAL | 3 refills | Status: DC

## 2020-06-06 MED ORDER — TRIAMCINOLONE ACETONIDE 55 MCG/ACT NA AERO: 2.0000 | INHALATION_SPRAY | Freq: Every day | NASAL | 11 refills | Status: AC

## 2020-06-06 MED ORDER — IPRATROPIUM BROMIDE 0.06 % NA SOLN: 2.0000 | Freq: Four times a day (QID) | NASAL | 11 refills | Status: AC

## 2020-06-06 MED ORDER — LACTULOSE 10 GM/15ML PO SOLN: 30.0000 g | Freq: Every day | ORAL | 11 refills | Status: AC

## 2020-06-06 NOTE — Assessment & Plan Note (Signed)
Zachary Mata, he has continued Axis III factors contributing to anxiety and depression, initially improved with mirtazapine and Lexapro. He gained too much weight so we dropped his mirtazapine down to 15 mg. We increased his Lexapro to 20. Continues to have significant anxiety symptoms so I am going to add Abilify and behavioral therapy. He will use Benadryl at night to help him sleep, though I think his insomnia is simply a byproduct of his primary anxiety. Follow-up with this in 4 weeks.

## 2020-06-06 NOTE — Assessment & Plan Note (Addendum)
Refilling Aldactone and lactulose. He has been handed off to hepatologist but they tell me that she will not be prescribing medications for some reason. It sounds like this is transplant hepatology at [facility], he would be a candidate for liver transplant however his period of sobriety is too short, and he has not yet had counseling for alcohol use disorder. He also states that he would not want a liver transplant if it would mean "some kid not getting the liver." Life expectancy is short, patient's most recent MELD score is 23 which equates to a 19.6% three-month risk of mortality. The most recent Child's [place] Pugh Score was C and equates to a 45% likelihood of survival at 1 year and 35% at 2 years.  We will go ahead and get him the counseling he needs.

## 2020-06-06 NOTE — Progress Notes (Signed)
    Procedures performed today:    None.  Independent interpretation of notes and tests performed by another provider:   None.  Brief History, Exam, Impression, and Recommendations:    Anxiety and depression Zachary Mata returns, he has continued Axis III factors contributing to anxiety and depression, initially improved with mirtazapine and Lexapro. He gained too much weight so we dropped his mirtazapine down to 15 mg. We increased his Lexapro to 20. Continues to have significant anxiety symptoms so I am going to add Abilify and behavioral therapy. He will use Benadryl at night to help him sleep, though I think his insomnia is simply a byproduct of his primary anxiety. Follow-up with this in 4 weeks.  Allergic conjunctivitis and rhinitis, bilateral Persistent seasonal allergic rhinitis. He did not respond to Flonase though Optivar was helpful for his allergic conjunctivitis. Discontinue Flonase, switching from cetirizine to loratadine, switching from Flonase to Nasacort, adding nasal Atrovent.   Alcoholic cirrhosis of liver without ascites (HCC) Refilling Aldactone and lactulose. He has been handed off to hepatologist but they tell me that she will not be prescribing medications for some reason. It sounds like this is transplant hepatology at [facility], he would be a candidate for liver transplant however his period of sobriety is too short, and he has not yet had counseling for alcohol use disorder. He also states that he would not want a liver transplant if it would mean "some kid not getting the liver." Life expectancy is short, patient's most recent MELD score is 23 which equates to a 19.6% three-month risk of mortality. The most recent Child's [place] Pugh Score was C and equates to a 45% likelihood of survival at 1 year and 35% at 2 years.  We will go ahead and get him the counseling he needs.    ___________________________________________ Zachary Mata. Zachary Mata, M.D., ABFM.,  CAQSM. Primary Care and Sports Medicine [facility]  Adjunct Instructor of Family Medicine  [employer]

## 2020-06-06 NOTE — Assessment & Plan Note (Signed)
Persistent seasonal allergic rhinitis. He did not respond to Flonase though Optivar was helpful for his allergic conjunctivitis. Discontinue Flonase, switching from cetirizine to loratadine, switching from Flonase to Nasacort, adding nasal Atrovent.

## 2020-06-30 ENCOUNTER — Other Ambulatory Visit: Payer: Self-pay | Admitting: Sports Medicine

## 2020-07-01 ENCOUNTER — Other Ambulatory Visit: Payer: Self-pay | Admitting: Sports Medicine

## 2020-07-01 DIAGNOSIS — F419 Anxiety disorder, unspecified: Secondary | ICD-10-CM

## 2020-07-01 DIAGNOSIS — F32A Depression, unspecified: Secondary | ICD-10-CM

## 2020-07-04 ENCOUNTER — Other Ambulatory Visit: Payer: Self-pay | Admitting: Sports Medicine

## 2020-07-04 DIAGNOSIS — M5441 Lumbago with sciatica, right side: Secondary | ICD-10-CM

## 2020-07-04 DIAGNOSIS — G8929 Other chronic pain: Secondary | ICD-10-CM

## 2020-07-07 ENCOUNTER — Ambulatory Visit: Payer: Commercial Managed Care - PPO | Admitting: Sports Medicine

## 2020-07-14 ENCOUNTER — Other Ambulatory Visit: Payer: Self-pay

## 2020-07-14 ENCOUNTER — Encounter: Payer: Self-pay | Admitting: Sports Medicine

## 2020-07-14 ENCOUNTER — Ambulatory Visit (INDEPENDENT_AMBULATORY_CARE_PROVIDER_SITE_OTHER): Payer: Commercial Managed Care - PPO | Admitting: Sports Medicine

## 2020-07-14 DIAGNOSIS — F32A Depression, unspecified: Secondary | ICD-10-CM | POA: Diagnosis not present

## 2020-07-14 DIAGNOSIS — K120 Recurrent oral aphthae: Secondary | ICD-10-CM | POA: Insufficient documentation

## 2020-07-14 DIAGNOSIS — M62838 Other muscle spasm: Secondary | ICD-10-CM

## 2020-07-14 DIAGNOSIS — F419 Anxiety disorder, unspecified: Secondary | ICD-10-CM

## 2020-07-14 MED ORDER — TRIAMCINOLONE ACETONIDE 0.1 % MT PSTE: 1.0000 "application " | PASTE | Freq: Two times a day (BID) | OROMUCOSAL | 11 refills | Status: AC

## 2020-07-14 NOTE — Progress Notes (Addendum)
    Procedures performed today:    None.  Independent interpretation of notes and tests performed by another provider:   None.  Brief History, Exam, Impression, and Recommendations:    Aphthous ulcer of tongue Has been getting aphthous ulcers on tongue, adding some triamcinolone dental paste.  Anxiety and depression Zachary Mata returns, he continues to have Axis III factors contributing to anxiety and depression, initially improved with mirtazapine and Lexapro, Lexapro dropped down to 15 mg due to excessive weight gain, Lexapro increased to 20, I added Abilify at the last visit, he really has not had much improvement so we will bump him up to 10 of Abilify, he has not started behavioral therapy either. I would like psychiatry to weigh in at this juncture.  Muscle spasm Continues to have full body muscle cramps and spasms, checking electrolytes. If electrolytes are normal we will probably add some magnesium oxide.  Update: Noted hypocalcemia, hyponatremia, normal phosphate and magnesium, adding calcium carbonate and sodium chloride tablets. We can revisit labs in 2 weeks.  Orders placed for labs.  He just needs to come in to get them done after 2 weeks of the 2 above medications.    ___________________________________________ Zachary Mata. Zachary Mata, M.D., ABFM., CAQSM. Primary Care and Sports Medicine [facility]  Adjunct Instructor of Family Medicine  [employer]

## 2020-07-14 NOTE — Assessment & Plan Note (Signed)
[name] returns, he continues to have Axis III factors contributing to anxiety and depression, initially improved with mirtazapine and Lexapro, Lexapro dropped down to 15 mg due to excessive weight gain, Lexapro increased to 20, I added Abilify at the last visit, he really has not had much improvement so we will bump him up to 10 of Abilify, he has not started behavioral therapy either. I would like psychiatry to weigh in at this juncture.

## 2020-07-14 NOTE — Assessment & Plan Note (Addendum)
Continues to have full body muscle cramps and spasms, checking electrolytes. If electrolytes are normal we will probably add some magnesium oxide.  Update: Noted hypocalcemia, hyponatremia, normal phosphate and magnesium, adding calcium carbonate and sodium chloride tablets. We can revisit labs in 2 weeks.  Orders placed for labs.  He just needs to come in to get them done after 2 weeks of the 2 above medications.

## 2020-07-14 NOTE — Assessment & Plan Note (Signed)
Has been getting aphthous ulcers on tongue, adding some triamcinolone dental paste.

## 2020-07-17 LAB — COMPREHENSIVE METABOLIC PANEL
AG Ratio: 0.7 (calc) — ABNORMAL LOW (ref 1.0–2.5)
ALT: 46 U/L (ref 9–46)
AST: 54 U/L — ABNORMAL HIGH (ref 10–40)
Albumin: 2.9 g/dL — ABNORMAL LOW (ref 3.6–5.1)
Alkaline phosphatase (APISO): 190 U/L — ABNORMAL HIGH (ref 36–130)
BUN: 11 mg/dL (ref 7–25)
CO2: 26 mmol/L (ref 20–32)
Calcium: 8.3 mg/dL — ABNORMAL LOW (ref 8.6–10.3)
Chloride: 99 mmol/L (ref 98–110)
Creat: 1.2 mg/dL (ref 0.60–1.35)
Globulin: 4 g/dL (calc) — ABNORMAL HIGH (ref 1.9–3.7)
Glucose, Bld: 137 mg/dL (ref 65–139)
Potassium: 4.6 mmol/L (ref 3.5–5.3)
Sodium: 131 mmol/L — ABNORMAL LOW (ref 135–146)
Total Bilirubin: 8.2 mg/dL — ABNORMAL HIGH (ref 0.2–1.2)
Total Protein: 6.9 g/dL (ref 6.1–8.1)

## 2020-07-17 LAB — PHOSPHORUS: Phosphorus: 3.7 mg/dL (ref 2.5–4.5)

## 2020-07-17 LAB — MAGNESIUM: Magnesium: 1.7 mg/dL (ref 1.5–2.5)

## 2020-07-18 MED ORDER — CALCIUM CARBONATE 1250 (500 CA) MG PO CHEW: 1.0000 | CHEWABLE_TABLET | Freq: Two times a day (BID) | ORAL | 1 refills | Status: AC

## 2020-07-18 MED ORDER — SODIUM CHLORIDE 1 G PO TABS: 1.0000 g | ORAL_TABLET | Freq: Two times a day (BID) | ORAL | 1 refills | Status: DC

## 2020-07-18 NOTE — Addendum Note (Signed)
Addended by: Monica Becton on: 07/18/2020 12:03 PM   Modules accepted: Orders

## 2020-07-26 ENCOUNTER — Telehealth: Payer: Self-pay

## 2020-07-26 ENCOUNTER — Other Ambulatory Visit: Payer: Self-pay | Admitting: Sports Medicine

## 2020-07-26 DIAGNOSIS — F419 Anxiety disorder, unspecified: Secondary | ICD-10-CM

## 2020-07-26 DIAGNOSIS — M62838 Other muscle spasm: Secondary | ICD-10-CM

## 2020-07-26 DIAGNOSIS — F32A Depression, unspecified: Secondary | ICD-10-CM

## 2020-07-26 NOTE — Telephone Encounter (Signed)
It is available over-the-counter at any [facility] or other store.

## 2020-07-26 NOTE — Telephone Encounter (Signed)
Pt's wife called and said that they are having trouble getting the calcium carbonate. She states that the pharmacy does not have the chewable. Pt's wife would like to know if there's an alternative.

## 2020-08-01 MED ORDER — SODIUM CHLORIDE 1 G PO TABS: 1.0000 g | ORAL_TABLET | Freq: Two times a day (BID) | ORAL | 1 refills | Status: DC

## 2020-08-01 NOTE — Telephone Encounter (Signed)
Katrina advised.

## 2020-08-01 NOTE — Telephone Encounter (Signed)
Tablets are fine, increasing sodium chloride to twice daily, we do need to recheck his CMP.  Orders are in.

## 2020-08-01 NOTE — Telephone Encounter (Signed)
Katrina called and wanted to know if Zachary Mata should take the Sodium Chloride twice daily. If so the quanity needs to be changed to 60 instead of 30. Also she wanted to know if he has to take chewable calcium or can he take tablets. Please advise.

## 2020-08-25 ENCOUNTER — Ambulatory Visit (INDEPENDENT_AMBULATORY_CARE_PROVIDER_SITE_OTHER): Payer: Commercial Managed Care - PPO | Admitting: Sports Medicine

## 2020-08-25 ENCOUNTER — Encounter: Payer: Self-pay | Admitting: Sports Medicine

## 2020-08-25 DIAGNOSIS — F32A Depression, unspecified: Secondary | ICD-10-CM

## 2020-08-25 DIAGNOSIS — F419 Anxiety disorder, unspecified: Secondary | ICD-10-CM

## 2020-08-25 DIAGNOSIS — K7030 Alcoholic cirrhosis of liver without ascites: Secondary | ICD-10-CM | POA: Diagnosis not present

## 2020-08-25 MED ORDER — ZOLPIDEM TARTRATE 5 MG PO TABS: 5.0000 mg | ORAL_TABLET | Freq: Every evening | ORAL | 1 refills | Status: DC | PRN

## 2020-08-25 MED ORDER — MAGNESIUM OXIDE 400 MG PO TABS: 800.0000 mg | ORAL_TABLET | Freq: Every day | ORAL | 3 refills | Status: AC

## 2020-08-25 NOTE — Progress Notes (Signed)
    Procedures performed today:    None.  Independent interpretation of notes and tests performed by another provider:   None.  Brief History, Exam, Impression, and Recommendations:    Alcoholic cirrhosis of liver without ascites (HCC) [name] returns, he has end-stage liver cirrhosis, he is comanaged with hepatology, Dawn Drazek. Marland Kitchen  He is not a candidate for liver transplantation as his sobriety has not been successful. He is still trying to get in for counseling for alcohol use disorder. He is also not tremendously interested in liver transplantation. Life expectancy is short, his MELD score = 23 which translates to a 19.6% 49-month risk of mortality and his Child-Pugh score is C which equates to a 45% likelihood of survival at 1 year and at 35% likelihood of survival at 2 years. He will continue to be comanaged with hematology. He did endorse some rectal bleeding, he does have an appointment coming up with gastroenterology for this. He also endorses some mental fogginess, recently started on Xifaxan, which should help. He is also having significant cramps in his legs, discontinue sodium chloride tablets and switching to magnesium oxide.   There is mild ascites and lower extremity edema which is managed by his hepatologist with furosemide. He is on a fluid restriction. I have written a narrative to support his disability that they will forward to their attorney.  Anxiety and depression I think we have finally gotten a good hold on Chad's mood, he is on Lexapro, mirtazapine, Abilify at 10 mg, mood is acceptable, he understands that we are not going to get complete control over his mood with his current terminal illness. Unfortunately he is having significant difficulty sleeping, we have tried hydroxyzine, ineffective, I am going to add Ambien, he tells me that he will not drink any alcohol when taking this drug.    ___________________________________________ Ihor Austin. Benjamin Stain,  M.D., ABFM., CAQSM. Primary Care and Sports Medicine [facility]  Adjunct Instructor of Family Medicine  [employer]

## 2020-08-25 NOTE — Assessment & Plan Note (Addendum)
[name] returns, he has end-stage liver cirrhosis, he is comanaged with hepatology, Dawn Drazek. Marland Kitchen  He is not a candidate for liver transplantation as his sobriety has not been successful. He is still trying to get in for counseling for alcohol use disorder. He is also not tremendously interested in liver transplantation. Life expectancy is short, his MELD score = 23 which translates to a 19.6% 26-month risk of mortality and his Child-Pugh score is C which equates to a 45% likelihood of survival at 1 year and at 35% likelihood of survival at 2 years. He will continue to be comanaged with hematology. He did endorse some rectal bleeding, he does have an appointment coming up with gastroenterology for this. He also endorses some mental fogginess, recently started on Xifaxan, which should help. He is also having significant cramps in his legs, discontinue sodium chloride tablets and switching to magnesium oxide.   There is mild ascites and lower extremity edema which is managed by his hepatologist with furosemide. He is on a fluid restriction. I have written a narrative to support his disability that they will forward to their attorney.

## 2020-08-25 NOTE — Assessment & Plan Note (Signed)
I think we have finally gotten a good hold on Zachary Mata's mood, he is on Lexapro, mirtazapine, Abilify at 10 mg, mood is acceptable, he understands that we are not going to get complete control over his mood with his current terminal illness. Unfortunately he is having significant difficulty sleeping, we have tried hydroxyzine, ineffective, I am going to add Ambien, he tells me that he will not drink any alcohol when taking this drug.

## 2020-08-30 ENCOUNTER — Other Ambulatory Visit: Payer: Self-pay | Admitting: Gastroenterology

## 2020-09-03 ENCOUNTER — Other Ambulatory Visit: Payer: Self-pay | Admitting: Gastroenterology

## 2020-09-04 NOTE — Telephone Encounter (Signed)
I do not see in liver clinic notes if they stopped it or if original Rx ran out. Please find out from his wife is he is taking it. He should be, based on varices seen on EGD 03/2020  - HD

## 2020-09-04 NOTE — Telephone Encounter (Signed)
Per his wife Lady Gary he has not taken this medication since March/April 2022 as directed by Annamarie Major.

## 2020-09-04 NOTE — Telephone Encounter (Signed)
There seems to be some misunderstanding, as I confirmed with Annamarie Major, NP that their clinic did not recommend stopping the nadolol.  I will send a refill, and please let his wife know.  - HD

## 2020-09-05 ENCOUNTER — Ambulatory Visit (INDEPENDENT_AMBULATORY_CARE_PROVIDER_SITE_OTHER): Payer: Commercial Managed Care - PPO | Admitting: Medical-Surgical

## 2020-09-05 ENCOUNTER — Encounter: Payer: Self-pay | Admitting: Medical-Surgical

## 2020-09-05 VITALS — BP 111/62 | HR 86 | Resp 20 | Ht 66.0 in | Wt 156.0 lb

## 2020-09-05 DIAGNOSIS — R1310 Dysphagia, unspecified: Secondary | ICD-10-CM

## 2020-09-05 DIAGNOSIS — R600 Localized edema: Secondary | ICD-10-CM

## 2020-09-05 DIAGNOSIS — K7030 Alcoholic cirrhosis of liver without ascites: Secondary | ICD-10-CM | POA: Diagnosis not present

## 2020-09-05 NOTE — Progress Notes (Signed)
HPI with pertinent ROS:   CC: Difficulty swallowing, poor appetite, fluid  HPI: Pleasant 40 year old male accompanied by his wife presenting for evaluation of symptoms including difficulty swallowing, poor appetite, and fluid buildup.  If the symptoms started shortly after his visit with Dr. Karie Schwalbe approximately 1 week ago.  He is having trouble eating.  He does feel hunger and wants to eat but often gets his food and then has no desire to actually consume it.  He is having difficulty with swallowing and often catches himself with frequent throat clearing as well as gagging.  He did have a swallowing evaluation in October but this was during a hospitalization with less than desired level of consciousness.  Had evaluation by GI with an upper endoscopy in February notable for normal Larynex but grade 2 varices in the distal portion of the esophagus.  Knows he is on a fluid restriction but has difficulty adhering to it.  Feels that he is thirsty all the time and has an average of just under 2 L of fluid intake per day.  Reports that he is not drinking alcohol at this point and prefers to drink water, sodas, or juices.  A few days ago, they noted a swollen area along the midline thoracic spine.  No injury to the area, pain, tenderness, or redness.  This has improved over the past couple of days but is still slightly puffier than usual.  Reports that he is no longer seeing his liver doctor anymore as he is not a candidate for liver transplant.  They will just be following up with Dr. Karie Schwalbe and GI from this point forward.  I reviewed the past medical history, family history, social history, surgical history, and allergies today and no changes were needed.  Please see the problem list section below in epic for further details.   Physical exam:   General: Well Developed, well nourished, and in no acute distress.  Neuro: Alert and oriented x3.  HEENT: Normocephalic, atraumatic.  Skin: Warm and dry. Cardiac:  Regular rate and rhythm, no murmurs rubs or gallops, no lower extremity edema.  Respiratory: Clear to auscultation bilaterally. Not using accessory muscles, speaking in full sentences. Abdomen: Soft, nontender, mildly distended. Bowel sounds + x 4 quadrants. No HSM appreciated.  Impression and Recommendations:    1. Alcoholic cirrhosis of liver without ascites (HCC) Unfortunately, Zachary Mata has progressed to terminal stage of alcoholic cirrhosis.  It is very important that he follows along with fluid restriction.  Recommend cutting back to no more than 50 ounces of fluid per day for goal.  Discussed options for maintaining oral lubrication and preventing dry mouth issues.  He does have a follow-up with GI on the 26th so recommend he discuss further symptom management and evaluation at that time.  2. Dysphagia, unspecified type Unclear etiology.  He does not have a desire to complete another swallow study and his upper GI did not have findings to indicate a cause for dysphagia.  Recommend eating foods and small bites and making sure to chew thoroughly.  Also recommend double swallowing prior to moving onto the next bite of food.  Frequent throat clearing during meals may also be beneficial.  If this worsens with these measures, may need further evaluation  3. Localized edema Swollen area to the center of the back has improved but simply feels like third spacing.  Crepitus noted with palpation.  Discussed the importance of fluid restriction.  Return for Follow-up with Dr. Karie Schwalbe in 1 to  2 weeks for dysphagia. ___________________________________________ Thayer Ohm, DNP, APRN, FNP-BC Primary Care and Sports Medicine [facility]

## 2020-09-07 ENCOUNTER — Ambulatory Visit (HOSPITAL_COMMUNITY): Payer: Self-pay | Admitting: Psychiatry

## 2020-09-11 ENCOUNTER — Telehealth: Payer: Self-pay | Admitting: Sports Medicine

## 2020-09-11 DIAGNOSIS — F32A Depression, unspecified: Secondary | ICD-10-CM

## 2020-09-11 DIAGNOSIS — F419 Anxiety disorder, unspecified: Secondary | ICD-10-CM

## 2020-09-11 NOTE — Telephone Encounter (Signed)
Mrs. Menning called. Mr Wong needs a refill on his Zolpidem.  She states that Dr. Karie Schwalbe prescribed 15 days but spouse says he meant to prescribe 30 days because he did this once before.  Thank you.

## 2020-09-12 MED ORDER — ZOLPIDEM TARTRATE 5 MG PO TABS
5.0000 mg | ORAL_TABLET | Freq: Every evening | ORAL | 1 refills | Status: DC | PRN
Start: 2020-09-12 — End: 2020-11-12

## 2020-09-12 NOTE — Telephone Encounter (Signed)
Left message advising

## 2020-09-12 NOTE — Telephone Encounter (Signed)
Refilled

## 2020-09-23 ENCOUNTER — Other Ambulatory Visit: Payer: Self-pay | Admitting: Sports Medicine

## 2020-09-23 DIAGNOSIS — F419 Anxiety disorder, unspecified: Secondary | ICD-10-CM

## 2020-09-28 ENCOUNTER — Other Ambulatory Visit: Payer: Self-pay | Admitting: Sports Medicine

## 2020-09-28 DIAGNOSIS — F32A Depression, unspecified: Secondary | ICD-10-CM

## 2020-09-28 DIAGNOSIS — F419 Anxiety disorder, unspecified: Secondary | ICD-10-CM

## 2020-09-29 ENCOUNTER — Other Ambulatory Visit (INDEPENDENT_AMBULATORY_CARE_PROVIDER_SITE_OTHER): Payer: Commercial Managed Care - PPO

## 2020-09-29 ENCOUNTER — Ambulatory Visit (INDEPENDENT_AMBULATORY_CARE_PROVIDER_SITE_OTHER): Payer: Commercial Managed Care - PPO | Admitting: Nurse Practitioner

## 2020-09-29 ENCOUNTER — Encounter: Payer: Self-pay | Admitting: Nurse Practitioner

## 2020-09-29 VITALS — BP 88/56 | HR 60 | Ht 66.0 in | Wt 152.0 lb

## 2020-09-29 DIAGNOSIS — K625 Hemorrhage of anus and rectum: Secondary | ICD-10-CM | POA: Diagnosis not present

## 2020-09-29 DIAGNOSIS — K7030 Alcoholic cirrhosis of liver without ascites: Secondary | ICD-10-CM | POA: Diagnosis not present

## 2020-09-29 DIAGNOSIS — R1310 Dysphagia, unspecified: Secondary | ICD-10-CM

## 2020-09-29 LAB — COMPREHENSIVE METABOLIC PANEL
ALT: 38 U/L (ref 0–53)
AST: 47 U/L — ABNORMAL HIGH (ref 0–37)
Albumin: 2.6 g/dL — ABNORMAL LOW (ref 3.5–5.2)
Alkaline Phosphatase: 170 U/L — ABNORMAL HIGH (ref 39–117)
BUN: 17 mg/dL (ref 6–23)
CO2: 23 mEq/L (ref 19–32)
Calcium: 7.9 mg/dL — ABNORMAL LOW (ref 8.4–10.5)
Chloride: 98 mEq/L (ref 96–112)
Creatinine, Ser: 1.46 mg/dL (ref 0.40–1.50)
GFR: 60.22 mL/min (ref >60.00)
Glucose, Bld: 121 mg/dL — ABNORMAL HIGH (ref 70–99)
Potassium: 3.8 mEq/L (ref 3.5–5.1)
Sodium: 129 mEq/L — ABNORMAL LOW (ref 135–145)
Total Bilirubin: 7.6 mg/dL — ABNORMAL HIGH (ref 0.2–1.2)
Total Protein: 6.3 g/dL (ref 6.0–8.3)

## 2020-09-29 LAB — PROTIME-INR
INR: 2.2 ratio — ABNORMAL HIGH (ref 0.8–1.0)
Prothrombin Time: 23.2 s — ABNORMAL HIGH (ref 9.6–13.1)

## 2020-09-29 NOTE — Progress Notes (Signed)
ASSESSMENT AND PLAN    # 40 yo male with decompensated cirrhosis secondary to Etoh and complicated by esophageal varices, ascites and hepatic encephalopathy. Last evaluated by [facility] in mid July 2022. Based on recent labs,  MELD at that time was 21. Patient has had two positive PETH tests since April, one of those was  > 170 ( Normal < 20) indicating recent Etoh use.  Due to ongoing Etoh patient is not a candidate for liver transplantation. Patient tells me he wouldn't want a transplant anyway. He says he hasn't imbibed in months. He will not be returning to [place].   --Continue lasix 20 mg daily / Aldactone 100 mg daily. No edema or noticeable ascites today. Diuresis previously challenging secondary to hyponatremia. He is followed by Nephrology. In hindsight, now that we know about ongoing Etoh use, hyponatremia probably exacerbated by beer intake. He has un upcoming follow up with Nephrology. --Discussed importance of a 2 grams / day sodium diet --Continue Xifaxan 550 mg BID and Lactulose 30 grams daily  --Update labs today  --[place] screening: AFP 9.2 in late April 2022 ( [facility]) . No liver lesions on CT scan w/ contrast in March 2022 and no reported liver lesions on MRI / MRCP in April 2022.  --Varices screening: grade II esophageal varices on EGD March 2022. When we referred patient to [facility] we queried best long term management of varices ( beta blocker or elective banding). I didn't see that this was addressed by [facility]. For now he is still taking Nadolol. He is only taking 20 mg daily but I didn't increase it as his heart rate is 60 and BP 88/56. --Weakly positive actin level and elevated IgG. 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   --He is Hep A and Hep B immune.   --Elevated CA 19-9 of 184 in July 2022( [facility]). No lesions on CT  scan / MRI w/ MRCP. [facility] felt elevation possibly related to Etoh associated hepatitis.   # Cholelithiasis / gallbladder wall thickening on CT scan in March 2022 . MRI / MRCP 06/01/20 was degraded by motion but suggests low grade chronic cholecystitis. The body of the report mentions a "mass like "region of enhancement in the gallbladder. He must of seen General Surgery at some point as the MRI was ordered by Michaelle Birks , MD   # Intermittent solid food dysphagia since June. Etiology unclear. No oral candida on exam. EGD in Feb 2022 negative for strictures / lesions other than varices.  --Will discuss with Dr. Loletha Carrow, patient's primary GI who knows him best ( this is my first time meeting patient). Maybe a barium swallow with tablet to start?   # Chronic intermittent, scant rectal bleeding with bowel movements. Normally in this situation we would have pursued a colonoscopy.  --Given his overall condition / prognosis, I don't know that colonoscopy is a priority right now but will defer to Dr. Loletha Carrow. No reported colon lesions on CT scans / MRI done within the last several months.     HISTORY OF PRESENT ILLNESS    Chief Complaint : follow up on cirrhosis  Zachary Mata is a 40 y.o. male known to Dr. Loletha Carrow  with a past medical history significant for Etoh cirrhosis, Etoh abuse. See PMH below for any additional medical problems.  Zachary Mata is here with his wife. He follows with Dr Loletha Carrow for Etoh cirrhosis with ascites, hepatic encephalopathy, and grade II varices. He was last seen in March 2022. He had reportedly been abstinent from [facility] for months. Complete hepatic serologic workup was obtained.  He had a mildly elevated actin antibody along with an elevated IgG level. We referred him to [facility] to get their opinion on whether or not to proceed with liver biopsy out of concern for possible AIH. We also queried the best long term management of his varices ( beta blocker vrs  elective banding).   Since his last office visit here in March 2022 patient has been seen a couple of times by Roosevelt Locks, NP with [facility]. I reviewed Dawn's last clinic note from 08/22/20. Despite adamantly denying Etoh use patient has had two very elevated phosphatidyl ethanol levels.   Patient not felt to be candidate for transplantation due to ongoing etoh use.  Intensive outpatient program for etoh was recommended. He was continued on diuretics. For HE he was continued on  Xifaxan and lactulose and Zinc was added. Advised on low sodium diet. Levocarnitine was recommended to prevent muscle cramps in patient's on diuretics.   INTERVAL HISTORY:    Zachary Mata says he hasn't had any Etoh in months. He doesn't plan to return to [facility] as he isn't interested in a transplant. He is taking prescribed Aldactone and lasix. Someone had prescribed him a salt pill in June which led to swelling in legs ( [facility] stopped the sodium chloride pill, ? Prescribed by PCP).  He is compliant with Xifaxan and Lactulose.    Zachary Mata has been having having intermittent solid food dysphagia since June. Wife recalls he had a MBSS while hospitalized in Oct 2021 with decompensated cirrhosis.  MBSS apparently done to evaluated for aspiration. SLP subsequently recommended a regular diet and nectar thick liquids.  His swallowing was okay at home until around June of this year. Of note there were no esophageal abnormalities other than Grade II varices on EGD in February 2022.   Additionally Zachary Mata has chronic intermittent painless rectal bleeding with bowel movements. He denies constipation. Stool are not hard and he has several BMs a day on Lactulose.    Data Reviewed in Care Everywhere:  [place]  April 2022 AFP 9.2 Drug monitoring report positive for marijuana Phosphatidylethanol 720 ( reporting limit 20) Smooth muscle ab 22 ( normal , 20), IgG 2420 Liver kidney microsomal less than 20 (  normal < 20) TIBC 262, 93% iron saturation, ferritin 656  08/22/20 Alk phos 182, total bili 11, AST 68, ALT 56, albumin 2.6, INR 1.9 Creatinine 1.29 CA 19-9 184.   PREVIOUS ENDOSCOPIC EVALUATIONS / PERTINENT STUDIES:   Feb 2022 EGD Normal larynx. - Grade II esophageal varices. - Portal hypertensive gastropathy. - Normal examined duodenum. - No specimens collected   06/01/20 MRI / MRCP IMPRESSION: 1. Heterogeneous early arterial phase enhancement in the liver probably a manifestation of hepatitis in the setting of cirrhosis. 2. Cholelithiasis. Accentuated gallbladder wall thickening diffusely suggesting low-grade chronic cholecystitis. 3. Splenomegaly. 4. Small uphill paraesophageal varices     Past Medical History:  Diagnosis Date   Alcohol abuse    Anxiety    Ascites    Chronic back pain 03/02/2012   Cirrhosis, alcoholic (HCC)    COPD (chronic obstructive pulmonary disease) (HCC)    Depression    ED (erectile dysfunction)  Esophageal varices without bleeding ([place])    Essential hypertension 03/02/2012   Hepatic encephalopathy (HCC)    Hypertension     Current Medications, Allergies, Past Surgical History, Family History and Social History were reviewed in [employer] record.   Current Outpatient Medications  Medication Sig Dispense Refill   ARIPiprazole (ABILIFY) 10 MG tablet Take 1 tablet (10 mg total) by mouth daily.     calcium carbonate (OS-CAL) 1250 (500 Ca) MG chewable tablet Chew 1 tablet (1,250 mg total) by mouth 2 (two) times daily. 30 tablet 1   escitalopram (LEXAPRO) 20 MG tablet TAKE 1 TABLET BY MOUTH EVERY DAY 90 tablet 2   furosemide (LASIX) 20 MG tablet Take 20 mg by mouth daily.     gabapentin (NEURONTIN) 300 MG capsule TAKE 1 CAPSULE BY MOUTH TWICE A DAY 60 capsule 3   hydrOXYzine (ATARAX/VISTARIL) 25 MG tablet TAKE 1 TABLET BY MOUTH TWICE A DAY AS NEEDED 180 tablet 4   ipratropium (ATROVENT) 0.06 % nasal spray Place 2  sprays into both nostrils 4 (four) times daily. 15 mL 11   loratadine (CLARITIN) 10 MG tablet Take 1 tablet (10 mg total) by mouth daily. 90 tablet 3   magnesium oxide (MAG-OX) 400 MG tablet Take 2 tablets (800 mg total) by mouth at bedtime. 180 tablet 3   mirtazapine (REMERON) 15 MG tablet TAKE 1 TABLET BY MOUTH EVERYDAY AT BEDTIME 90 tablet 2   nadolol (CORGARD) 20 MG tablet TAKE 1 TABLET BY MOUTH EVERY DAY 90 tablet 1   pantoprazole (PROTONIX) 40 MG tablet TAKE 1 TABLET BY MOUTH EVERY DAY 90 tablet 0   spironolactone (ALDACTONE) 100 MG tablet Take 1 tablet (100 mg total) by mouth daily. 90 tablet 3   triamcinolone (KENALOG) 0.1 % paste Use as directed 1 application in the mouth or throat 2 (two) times daily. 5 g 11   triamcinolone (NASACORT) 55 MCG/ACT AERO nasal inhaler Place 2 sprays into the nose daily. 1 each 11   Zinc 50 MG TABS Take 50 mg by mouth daily.     zolpidem (AMBIEN) 5 MG tablet Take 1 tablet (5 mg total) by mouth at bedtime as needed for sleep. 30 tablet 1   lactulose (CHRONULAC) 10 GM/15ML solution Take 45 mLs (30 g total) by mouth daily. 1350 mL 11   XIFAXAN 550 MG TABS tablet Take 550 mg by mouth 2 (two) times daily.     No current facility-administered medications for this visit.    Review of Systems: No chest pain. No shortness of breath. No urinary complaints.   PHYSICAL EXAM :    Wt Readings from Last 3 Encounters:  09/29/20 152 lb (68.9 kg)  09/05/20 156 lb (70.8 kg)  08/25/20 161 lb (73 kg)    BP (!) 88/56   Pulse 60   Ht $R'5\' 6"'aL$  (1.676 m)   Wt 152 lb (68.9 kg)   SpO2 99%   BMI 24.53 kg/m  Constitutional:  Pleasant male in no acute distress. Psychiatric: Normal mood and affect. Behavior is normal. EENT: Pupils normal.  Icteric sclera. . Neck supple  Pulmonary/chest: Effort normal and breath sounds normal. No wheezing, rales or rhonchi. Cardiac: RRR, no edema Abdominal: Soft, mildly distended, nontender. Bowel sounds active throughout. There are no  masses palpable. No hepatomegaly. Neurological: Alert and oriented to person place and time. No asterixis Skin: Skin is warm and dry. No rashes noted.  Tye Savoy, NP  09/29/2020, 2:41 PM

## 2020-09-29 NOTE — Patient Instructions (Signed)
LABS:  Lab work has been ordered for you today. Our lab is located in the basement. Press "B" on the elevator. The lab is located at the first door on the left as you exit the elevator.  HEALTHCARE LAWS AND MY CHART RESULTS: Due to recent changes in healthcare laws, you may see the results of your imaging and laboratory studies on MyChart before your provider has had a chance to review them.   We understand that in some cases there may be results that are confusing or concerning to you. Not all laboratory results come back in the same time frame and the provider may be waiting for multiple results in order to interpret others.  Please give [place] 48 hours in order for your provider to thoroughly review all the results before contacting the office for clarification of your results.   Follow the 2 gram sodium diet.  It was great seeing you today! Thank you for entrusting me with your care and choosing [facility].  Willette Cluster, NP  The [facility] providers would like to encourage you to use [facility] to communicate with providers for non-urgent requests or questions.  Due to long hold times on the telephone, sending your provider a message by [facility] may be faster and more efficient way to get a response. Please allow 48 business hours for a response.  Please remember that this is for non-urgent requests/questions.  If you are age 60 or older, your body mass index should be between 23-30. Your Body mass index is 24.53 kg/m. If this is out of the aforementioned range listed, please consider follow up with your Primary Care Provider.  If you are age 44 or younger, your body mass index should be between 19-25. Your Body mass index is 24.53 kg/m. If this is out of the aformentioned range listed, please consider follow up with your Primary Care Provider.

## 2020-10-03 ENCOUNTER — Encounter: Payer: Self-pay | Admitting: Nurse Practitioner

## 2020-10-06 ENCOUNTER — Other Ambulatory Visit: Payer: Self-pay

## 2020-10-06 ENCOUNTER — Other Ambulatory Visit (HOSPITAL_COMMUNITY): Payer: Self-pay

## 2020-10-06 ENCOUNTER — Encounter: Payer: Self-pay | Admitting: Sports Medicine

## 2020-10-06 ENCOUNTER — Ambulatory Visit (INDEPENDENT_AMBULATORY_CARE_PROVIDER_SITE_OTHER): Payer: Commercial Managed Care - PPO | Admitting: Sports Medicine

## 2020-10-06 DIAGNOSIS — R1310 Dysphagia, unspecified: Secondary | ICD-10-CM

## 2020-10-06 MED ORDER — FUROSEMIDE 20 MG PO TABS: 20.0000 mg | ORAL_TABLET | Freq: Every day | ORAL | 3 refills | Status: DC

## 2020-10-06 NOTE — Progress Notes (Signed)
    Procedures performed today:    None.  Independent interpretation of notes and tests performed by another provider:   None.  Brief History, Exam, Impression, and Recommendations:    Dysphagia [name] does have some dysphagia, he feels as though solid foods tend to get stuck more often than thin liquids, he does tend to cough and choke. I have asked him to use small bites and chew well in the meantime and will pull the trigger for a modified barium swallow study, he does have follow-up with gastroenterology.    ___________________________________________ Zachary Mata. Benjamin Stain, M.D., ABFM., CAQSM. Primary Care and Sports Medicine [facility]  Adjunct Instructor of Family Medicine  [employer]

## 2020-10-06 NOTE — Assessment & Plan Note (Signed)
[name] does have some dysphagia, he feels as though solid foods tend to get stuck more often than thin liquids, he does tend to cough and choke. I have asked him to use small bites and chew well in the meantime and will pull the trigger for a modified barium swallow study, he does have follow-up with gastroenterology.

## 2020-10-07 NOTE — Progress Notes (Signed)
____________________________________________________________  Attending physician addendum:  Thank you for sending this case to me. I have reviewed the entire note.  I have had 2 long conversations the [facility] provider regarding this patient guarding his multiple issues. Unfortunately, the patient continues to abuse alcohol, and repeatedly denied doing so despite 2 positive PETH tests. Therefore he is not a liver transplant candidate, either presently or in the future. His hyponatremia is explained by ongoing alcohol abuse and volume overload.  He has regular follow-up with nephrology to help manage his diuretics.  I sent him to Dr. Freida Busman regarding the biliary findings, and she did not feel they were neoplastic in nature.  Neither the [facility] hepatology provider nor I think that he has autoimmune hepatitis.  Dysphagia not likely mechanical/stricture in nature given no such findings on endoscopy earlier this year.  Probably some reflux related dysmotility, further imaging likely low yield.  No further medication changes or testing from our standpoint.  His condition is expected to further decline with ongoing alcohol abuse.  50-month follow-up with me at our office, as he will still need ongoing hepatoma screening.    Amada Jupiter, MD  ____________________________________________________________

## 2020-10-13 ENCOUNTER — Ambulatory Visit (HOSPITAL_COMMUNITY)
Admission: RE | Admit: 2020-10-13 | Discharge: 2020-10-13 | Disposition: A | Discharge status: Home / Self Care | Payer: Commercial Managed Care - PPO | Source: Ambulatory Visit | Attending: Sports Medicine | Admitting: Sports Medicine

## 2020-10-13 ENCOUNTER — Telehealth: Payer: Self-pay

## 2020-10-13 ENCOUNTER — Other Ambulatory Visit: Payer: Self-pay

## 2020-10-13 DIAGNOSIS — R1310 Dysphagia, unspecified: Secondary | ICD-10-CM | POA: Insufficient documentation

## 2020-10-13 NOTE — Telephone Encounter (Signed)
-----   Message from Meredith Pel, NP sent at 10/13/2020  3:19 PM EDT ----- Waynetta Sandy, please let [name] know that Dr. Myrtie Neither thinks dysphagia may be related to reflux changes.  Please check and see if he is on a PPI.  If not ask him to start Prilosec 20 mg every morning.  Dr. Myrtie Neither does not think that another EGD nor esophageal imaging would be beneficial at this point .  Please get him a 11-month follow-up with Dr. Myrtie Neither if he does not already have one scheduled.  Thanks

## 2020-10-16 NOTE — Telephone Encounter (Signed)
Called the patient's spouse at the "preferred " number. No answer. Left her a message and asked for a return call.

## 2020-10-16 NOTE — Telephone Encounter (Signed)
Spoke with the spouse. Appointment needs to be on Friday for her. She is the driver, they live in [place] and she is off on Friday afternoon. Appointment scheduled. Patient is on Protonix. Advised to contact the pharmacy when he is ready for a refill and we will approve the refill. Call with any questions or concerns.

## 2020-11-11 ENCOUNTER — Other Ambulatory Visit: Payer: Self-pay | Admitting: Gastroenterology

## 2020-11-11 ENCOUNTER — Other Ambulatory Visit: Payer: Self-pay | Admitting: Sports Medicine

## 2020-11-11 DIAGNOSIS — M5442 Lumbago with sciatica, left side: Secondary | ICD-10-CM

## 2020-11-11 DIAGNOSIS — G8929 Other chronic pain: Secondary | ICD-10-CM

## 2020-11-11 DIAGNOSIS — F419 Anxiety disorder, unspecified: Secondary | ICD-10-CM

## 2020-11-11 DIAGNOSIS — F32A Depression, unspecified: Secondary | ICD-10-CM

## 2020-12-15 ENCOUNTER — Other Ambulatory Visit (INDEPENDENT_AMBULATORY_CARE_PROVIDER_SITE_OTHER): Payer: Commercial Managed Care - PPO

## 2020-12-15 ENCOUNTER — Encounter: Payer: Self-pay | Admitting: Gastroenterology

## 2020-12-15 ENCOUNTER — Ambulatory Visit (INDEPENDENT_AMBULATORY_CARE_PROVIDER_SITE_OTHER): Payer: Commercial Managed Care - PPO | Admitting: Gastroenterology

## 2020-12-15 VITALS — BP 110/66 | HR 70 | Ht 66.0 in | Wt 149.8 lb

## 2020-12-15 DIAGNOSIS — I8510 Secondary esophageal varices without bleeding: Secondary | ICD-10-CM | POA: Diagnosis not present

## 2020-12-15 DIAGNOSIS — K7031 Alcoholic cirrhosis of liver with ascites: Secondary | ICD-10-CM

## 2020-12-15 DIAGNOSIS — D696 Thrombocytopenia, unspecified: Secondary | ICD-10-CM

## 2020-12-15 DIAGNOSIS — R1319 Other dysphagia: Secondary | ICD-10-CM | POA: Diagnosis not present

## 2020-12-15 DIAGNOSIS — D689 Coagulation defect, unspecified: Secondary | ICD-10-CM

## 2020-12-15 DIAGNOSIS — K7682 Hepatic encephalopathy: Secondary | ICD-10-CM

## 2020-12-15 LAB — CBC WITH DIFFERENTIAL/PLATELET
Basophils Absolute: 0.1 10*3/uL (ref 0.0–0.1)
Basophils Relative: 1.7 % (ref 0.0–3.0)
Eosinophils Absolute: 0.5 10*3/uL (ref 0.0–0.7)
Eosinophils Relative: 6.4 % — ABNORMAL HIGH (ref 0.0–5.0)
HCT: 24.9 % — ABNORMAL LOW (ref 39.0–52.0)
Hemoglobin: 8.3 g/dL — ABNORMAL LOW (ref 13.0–17.0)
Lymphocytes Relative: 30.2 % (ref 12.0–46.0)
Lymphs Abs: 2.2 10*3/uL (ref 0.7–4.0)
MCHC: 33.1 g/dL (ref 30.0–36.0)
MCV: 103 fl — ABNORMAL HIGH (ref 78.0–100.0)
Monocytes Absolute: 1.2 10*3/uL — ABNORMAL HIGH (ref 0.1–1.0)
Monocytes Relative: 15.8 % — ABNORMAL HIGH (ref 3.0–12.0)
Neutro Abs: 3.4 10*3/uL (ref 1.4–7.7)
Neutrophils Relative %: 45.9 % (ref 43.0–77.0)
Platelets: 96 10*3/uL — ABNORMAL LOW (ref 150.0–400.0)
RBC: 2.42 Mil/uL — ABNORMAL LOW (ref 4.22–5.81)
RDW: 17.4 % — ABNORMAL HIGH (ref 11.5–15.5)
WBC: 7.4 10*3/uL (ref 4.0–10.5)

## 2020-12-15 LAB — COMPREHENSIVE METABOLIC PANEL
ALT: 42 U/L (ref 0–53)
AST: 50 U/L — ABNORMAL HIGH (ref 0–37)
Albumin: 2.6 g/dL — ABNORMAL LOW (ref 3.5–5.2)
Alkaline Phosphatase: 156 U/L — ABNORMAL HIGH (ref 39–117)
BUN: 9 mg/dL (ref 6–23)
CO2: 25 mEq/L (ref 19–32)
Calcium: 7.9 mg/dL — ABNORMAL LOW (ref 8.4–10.5)
Chloride: 102 mEq/L (ref 96–112)
Creatinine, Ser: 1.13 mg/dL (ref 0.40–1.50)
GFR: 81.78 mL/min (ref >60.00)
Glucose, Bld: 92 mg/dL (ref 70–99)
Potassium: 3.6 mEq/L (ref 3.5–5.1)
Sodium: 133 mEq/L — ABNORMAL LOW (ref 135–145)
Total Bilirubin: 5 mg/dL — ABNORMAL HIGH (ref 0.2–1.2)
Total Protein: 6.3 g/dL (ref 6.0–8.3)

## 2020-12-15 LAB — FOLATE: Folate: 18.9 ng/mL (ref >5.9)

## 2020-12-15 LAB — VITAMIN B12: Vitamin B-12: >1550 pg/mL — ABNORMAL HIGH (ref 211–911)

## 2020-12-15 LAB — PROTIME-INR
INR: 2.4 ratio — ABNORMAL HIGH (ref 0.8–1.0)
Prothrombin Time: 24.6 s — ABNORMAL HIGH (ref 9.6–13.1)

## 2020-12-15 NOTE — Patient Instructions (Signed)
If you are age 39 or older, your body mass index should be between 23-30. Your Body mass index is 24.18 kg/m. If this is out of the aforementioned range listed, please consider follow up with your Primary Care Provider.  If you are age 31 or younger, your body mass index should be between 19-25. Your Body mass index is 24.18 kg/m. If this is out of the aformentioned range listed, please consider follow up with your Primary Care Provider.   ________________________________________________________  The [facility] providers would like to encourage you to use [facility] to communicate with providers for non-urgent requests or questions.  Due to long hold times on the telephone, sending your provider a message by [facility] may be a faster and more efficient way to get a response.  Please allow 48 business hours for a response.  Please remember that this is for non-urgent requests.  _______________________________________________________  Your provider has requested that you go to the basement level for lab work before leaving today. Press "B" on the elevator. The lab is located at the first door on the left as you exit the elevator.  Increase the Furosimide from 20 mg to 40 mg daily.   It was a pleasure to see you today!  Thank you for trusting me with your gastrointestinal care!

## 2020-12-15 NOTE — Progress Notes (Signed)
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  My addendum to the most recent office note is as follows: "I have had 2 long conversations the [facility] provider regarding this patient guarding his multiple issues. Unfortunately, the patient continues to abuse alcohol, and repeatedly denied doing so despite 2 positive PETH tests. Therefore he is not a liver transplant candidate, either presently or in the future. His hyponatremia is explained by ongoing alcohol abuse and volume overload.  He has regular follow-up with nephrology to help manage his diuretics.   I sent him to Dr. Zenia Resides regarding the biliary findings, and she did not feel they were neoplastic in nature.   Neither the [facility] hepatology provider nor I think that he has autoimmune hepatitis.   Dysphagia not likely mechanical/stricture in nature given no such findings on endoscopy earlier this year.  Probably some reflux related dysmotility, further imaging likely low yield.   No further medication changes or testing from our standpoint.  His condition is expected to further decline with ongoing alcohol abuse.   40-month follow-up with me at our office, as he will still need ongoing hepatoma screening." _____________________   Zachary Mata was here with his wife today.  He reports that he stopped drinking alcohol in June, but his wife is unable to confirm that since she is working during the day and thus not with him all the time.  She says she was surprised to learn that he had still been drinking as determined by labs with the hepatology clinic.  He continues to have feelings of food hung up or stuck in the neck at times, and there is a barium swallow report below he started Abilify perhaps several months ago, not clear when his dysphagia  began in relation to that (as that class of medicine can cause an esophageal dysmotility).  He has intermittent nausea, no vomiting.  He has abdominal bloating and gas, continues to take lactulose 45 mL once daily with 2-3 BMs per day on average.  He also continues rifaximin twice daily as recommended, spironolactone 100 mg daily, nadolol and furosemide 20 mg daily.  He has lately had fatigue, forgetfulness, tingling in his hands and feet and noticed increased peripheral edema, some days worse than others.  It does not sound like he restricts his sodium intake.  He also currently has a tooth ache. ROS: Cardiovascular:  no chest pain Respiratory: no dyspnea Mood stable on medicine Poor sleep Remainder of systems negative except as above The patient's Past Medical, Family and Social History were reviewed and are on file in the EMR.  Objective:  Med list reviewed  Current Outpatient Medications:    ARIPiprazole (ABILIFY) 10 MG tablet, Take 1 tablet (10 mg total) by mouth daily., Disp: , Rfl:    calcium carbonate (OS-CAL) 1250 (500 Ca) MG chewable tablet, Chew 1 tablet (1,250 mg total) by mouth 2 (two) times daily., Disp: 30 tablet, Rfl: 1   cetirizine (ZYRTEC) 10 MG tablet, Take 10 mg by mouth daily., Disp: , Rfl:    escitalopram (LEXAPRO) 20 MG tablet, TAKE 1 TABLET BY MOUTH EVERY DAY, Disp: 90 tablet, Rfl: 2   furosemide (LASIX) 20 MG tablet, Take 1 tablet (20 mg total) by mouth daily., Disp: 90 tablet, Rfl: 3   gabapentin (NEURONTIN) 300 MG capsule, TAKE 1 CAPSULE  BY MOUTH TWICE A DAY, Disp: 60 capsule, Rfl: 3   hydrOXYzine (ATARAX/VISTARIL) 25 MG tablet, TAKE 1 TABLET BY MOUTH TWICE A DAY AS NEEDED, Disp: 180 tablet, Rfl: 4   ipratropium (ATROVENT) 0.06 % nasal spray, Place 2 sprays into both nostrils 4 (four) times daily., Disp: 15 mL, Rfl: 11   magnesium oxide (MAG-OX) 400 MG tablet, Take 2 tablets (800 mg total) by mouth at bedtime., Disp: 180 tablet, Rfl: 3   mirtazapine (REMERON) 15 MG  tablet, TAKE 1 TABLET BY MOUTH EVERYDAY AT BEDTIME, Disp: 90 tablet, Rfl: 2   nadolol (CORGARD) 20 MG tablet, TAKE 1 TABLET BY MOUTH EVERY DAY, Disp: 90 tablet, Rfl: 1   pantoprazole (PROTONIX) 40 MG tablet, TAKE 1 TABLET BY MOUTH EVERY DAY, Disp: 90 tablet, Rfl: 0   spironolactone (ALDACTONE) 100 MG tablet, Take 1 tablet (100 mg total) by mouth daily., Disp: 90 tablet, Rfl: 3   triamcinolone (KENALOG) 0.1 % paste, Use as directed 1 application in the mouth or throat 2 (two) times daily., Disp: 5 g, Rfl: 11   triamcinolone (NASACORT) 55 MCG/ACT AERO nasal inhaler, Place 2 sprays into the nose daily., Disp: 1 each, Rfl: 11   XIFAXAN 550 MG TABS tablet, Take 550 mg by mouth 2 (two) times daily., Disp: , Rfl:    Zinc 50 MG TABS, Take 50 mg by mouth daily., Disp: , Rfl:    zolpidem (AMBIEN) 5 MG tablet, TAKE 1 TABLET BY MOUTH AT BEDTIME AS NEEDED FOR SLEEP., Disp: 30 tablet, Rfl: 1   lactulose (CHRONULAC) 10 GM/15ML solution, Take 45 mLs (30 g total) by mouth daily., Disp: 1350 mL, Rfl: 11   Vital signs in last 24 hrs: Vitals:   12/15/20 1411  BP: 110/66  Pulse: 70   Wt Readings from Last 3 Encounters:  12/15/20 149 lb 12.8 oz (67.9 kg)  10/06/20 155 lb (70.3 kg)  09/29/20 152 lb (68.9 kg)    Physical Exam  Chronically ill-appearing HEENT: sclera icteric, oral mucosa moist without lesions.  Poor dentition.  No obvious abscess seen, no swelling or tenderness along the jawline. Neck: supple, no thyromegaly, JVD or lymphadenopathy Cardiac: RRR without murmurs, S1S2 heard, 2+ pretibial edema bilaterally Pulm: clear to auscultation bilaterally, normal RR and effort noted Abdomen: soft, no tenderness, with active bowel sounds.  Liver edge and large as before, bulging flanks, no distention.  Positive fluid wave Skin; spider nevi and jaundice Ambulatory, gets on exam table without difficulty, speech fluent, no asterixis  Labs:  CBC Latest Ref Rng & Units 04/13/2020 03/15/2020 02/03/2020  WBC  4.0 - 10.5 K/uL 8.1 11.3(H) 11.7(H)  Hemoglobin 13.0 - 17.0 g/dL 11.9(L) 12.5(L) 11.3(L)  Hematocrit 39.0 - 52.0 % 33.8(L) 33.5(L) 29.4(L)  Platelets 150.0 - 400.0 K/uL 134.0 Repeated and verified X2.(L) 152 151   CMP Latest Ref Rng & Units 09/29/2020 07/17/2020 04/13/2020  Glucose 70 - 99 mg/dL 121(H) 137 163(H)  BUN 6 - 23 mg/dL 17 11 7   Creatinine 0.40 - 1.50 mg/dL 1.46 1.20 0.94  Sodium 135 - 145 mEq/L 129(L) 131(L) 134(L)  Potassium 3.5 - 5.1 mEq/L 3.8 4.6 3.6  Chloride 96 - 112 mEq/L 98 99 99  CO2 19 - 32 mEq/L 23 26 27   Calcium 8.4 - 10.5 mg/dL 7.9(L) 8.3(L) 8.8  Total Protein 6.0 - 8.3 g/dL 6.3 6.9 7.7  Total Bilirubin 0.2 - 1.2 mg/dL 7.6(H) 8.2(H) 7.9(H)  Alkaline Phos 39 - 117 U/L 170(H) - 165(H)  AST 0 - 37  U/L 47(H) 54(H) 45(H)  ALT 0 - 53 U/L 38 46 28   Other labs in the [facility] hepatology note scanned into the media section of the chart ___________________________________________ Radiologic studies: Pt presents with normal oropharyngeal swallow with functional mastication, normal timing of swallow initiation, reliable laryngeal vestibule closure with no penetration/aspiration. Good pharyngeal squeeze with no residue post-swallow.  Brief screen of esophagus did not reveal barium retention.  D/W pt and wife results/recs.  They viewed video in real time. Discussed potential for reflux and its symptoms, as well as coordination of swallow/breathing patterns related to his COPD. Discussed nutrition and consideration of diet changes that may impact GERD. Recommend continuing regular diet, thin liquids. Consider esophagram (barium swallow) if symptoms persist. SLP Visit Diagnosis Dysphagia, unspecified (R13.10)   ____________________________________________ Other:   _____________________________________________ Assessment & Plan  Assessment: Encounter Diagnoses  Name Primary?   Alcoholic cirrhosis of liver with ascites (HCC) Yes   Esophageal dysphagia     Encephalopathy, hepatic    Secondary esophageal varices without bleeding (HCC)    Coagulopathy (HCC)    Thrombocytopenia (HCC)    Decompensated alcohol-related cirrhosis.  I am concerned he may still be drinking alcohol, but at this point he is well aware of the risk of doing so and apparently made that clear to the hepatology provider as well.  His wife is understandably concerned, and she asked "how much time he has".  I am afraid that cannot be known, and he is always at risk for acute events of SBP, encephalopathy or upper GI bleeding.  If he is continuing to drink alcohol, his liver disease will steadily progress. 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  He saw nephrology who was managing his hyponatremia and volume overload.  Last seen there in September, unknown if he currently has follow-up plans with them.  In general, I referred him to nephrology because I think it would be best for them to manage his complex volume and electrolyte status. For now, I will increase his furosemide from 20 mg a day to 40 mg a day.  It does not seem clear that he has intention to restrict his daily sodium intake, which will limit the effect of diuretics.  Also, his relative hypotension and hyponatremia limit the diuretic doses as well.  He seems to be therapeutic on hepatic encephalopathy treatments  Some of Chad's dysphagia also appears to be poor dentition so difficulty swallowing, and I think he may have some degree of dysmotility.  Whether or not that is from Abilify or from generalized deconditioning and poor muscle mass is difficult to be certain.  There is no mechanical cause seen on EGD earlier this year and no current indication for repeat endoscopy.  I have gotten some updated labs with CBC, CMP, INR, AFP, B12, folate and thiamine to see if he has any reversible nutritional deficiencies.  I will continue to  do what I can for him, but I expect his condition to steadily progress, and his long-term prognosis is poor.  42 minutes were spent on this encounter (including extensive chart review, history/exam, counseling/coordination of care, and documentation) > 50% of that time was spent on counseling and coordination of care.   Charlie Pitter III

## 2020-12-19 ENCOUNTER — Other Ambulatory Visit: Payer: Self-pay

## 2020-12-19 DIAGNOSIS — K7031 Alcoholic cirrhosis of liver with ascites: Secondary | ICD-10-CM

## 2020-12-19 DIAGNOSIS — D649 Anemia, unspecified: Secondary | ICD-10-CM

## 2020-12-19 DIAGNOSIS — R772 Abnormality of alphafetoprotein: Secondary | ICD-10-CM

## 2020-12-23 LAB — VITAMIN B1: Vitamin B1 (Thiamine): 12 nmol/L (ref 8–30)

## 2020-12-23 LAB — AFP TUMOR MARKER: AFP-Tumor Marker: 6.8 ng/mL — ABNORMAL HIGH (ref <6.1)

## 2020-12-25 ENCOUNTER — Telehealth: Payer: Self-pay

## 2020-12-25 MED ORDER — FUROSEMIDE 40 MG PO TABS: 40.0000 mg | ORAL_TABLET | Freq: Every day | ORAL | 3 refills | Status: AC

## 2020-12-25 NOTE — Telephone Encounter (Signed)
Received a call from patient's wife. She called in because she wanted to confirm that pt still needs iron labs and they can stop by whenever between 8 am - 5 pm. Katrina also reports that patient has been doubling his Furosemide 20 mg tablet to make 40 mg but he will run out by the weekend. Katrina requested new RX for Furosemide 40 mg be sent to pharmacy on file. Advised that I will send in new RX. Katrina verbalized understanding and had no concerns at the end of the call.

## 2020-12-29 ENCOUNTER — Encounter (HOSPITAL_COMMUNITY): Payer: Self-pay

## 2020-12-29 ENCOUNTER — Emergency Department (HOSPITAL_COMMUNITY)
Admission: EM | Admit: 2020-12-29 | Discharge: 2020-12-29 | Disposition: A | Discharge status: Home / Self Care | Payer: Commercial Managed Care - PPO | Attending: Emergency Medicine | Admitting: Emergency Medicine

## 2020-12-29 DIAGNOSIS — Z79899 Other long term (current) drug therapy: Secondary | ICD-10-CM | POA: Diagnosis not present

## 2020-12-29 DIAGNOSIS — R55 Syncope and collapse: Secondary | ICD-10-CM

## 2020-12-29 DIAGNOSIS — R531 Weakness: Secondary | ICD-10-CM | POA: Diagnosis present

## 2020-12-29 DIAGNOSIS — R42 Dizziness and giddiness: Secondary | ICD-10-CM | POA: Diagnosis not present

## 2020-12-29 DIAGNOSIS — F17210 Nicotine dependence, cigarettes, uncomplicated: Secondary | ICD-10-CM | POA: Insufficient documentation

## 2020-12-29 DIAGNOSIS — H1589 Other disorders of sclera: Secondary | ICD-10-CM | POA: Diagnosis not present

## 2020-12-29 DIAGNOSIS — J449 Chronic obstructive pulmonary disease, unspecified: Secondary | ICD-10-CM | POA: Diagnosis not present

## 2020-12-29 DIAGNOSIS — I10 Essential (primary) hypertension: Secondary | ICD-10-CM | POA: Diagnosis not present

## 2020-12-29 LAB — CBC WITH DIFFERENTIAL/PLATELET
Abs Immature Granulocytes: 0.09 10*3/uL — ABNORMAL HIGH (ref 0.00–0.07)
Basophils Absolute: 0.1 10*3/uL (ref 0.0–0.1)
Basophils Relative: 1 %
Eosinophils Absolute: 0.3 10*3/uL (ref 0.0–0.5)
Eosinophils Relative: 3 %
HCT: 24.9 % — ABNORMAL LOW (ref 39.0–52.0)
Hemoglobin: 8 g/dL — ABNORMAL LOW (ref 13.0–17.0)
Immature Granulocytes: 1 %
Lymphocytes Relative: 22 %
Lymphs Abs: 1.9 10*3/uL (ref 0.7–4.0)
MCH: 32.1 pg (ref 26.0–34.0)
MCHC: 32.1 g/dL (ref 30.0–36.0)
MCV: 100 fL (ref 80.0–100.0)
Monocytes Absolute: 1.1 10*3/uL — ABNORMAL HIGH (ref 0.1–1.0)
Monocytes Relative: 13 %
Neutro Abs: 5.5 10*3/uL (ref 1.7–7.7)
Neutrophils Relative %: 60 %
Platelets: 95 10*3/uL — ABNORMAL LOW (ref 150–400)
RBC: 2.49 MIL/uL — ABNORMAL LOW (ref 4.22–5.81)
RDW: 17.4 % — ABNORMAL HIGH (ref 11.5–15.5)
Smear Review: NORMAL
WBC: 9 10*3/uL (ref 4.0–10.5)
nRBC: 0 % (ref 0.0–0.2)

## 2020-12-29 LAB — TYPE AND SCREEN
ABO/RH(D): O POS
Antibody Screen: NEGATIVE

## 2020-12-29 LAB — MAGNESIUM: Magnesium: 1.5 mg/dL — ABNORMAL LOW (ref 1.7–2.4)

## 2020-12-29 LAB — COMPREHENSIVE METABOLIC PANEL
ALT: 33 U/L (ref 0–44)
AST: 46 U/L — ABNORMAL HIGH (ref 15–41)
Albumin: 2 g/dL — ABNORMAL LOW (ref 3.5–5.0)
Alkaline Phosphatase: 138 U/L — ABNORMAL HIGH (ref 38–126)
Anion gap: 8 (ref 5–15)
BUN: 10 mg/dL (ref 6–20)
CO2: 22 mmol/L (ref 22–32)
Calcium: 7.4 mg/dL — ABNORMAL LOW (ref 8.9–10.3)
Chloride: 101 mmol/L (ref 98–111)
Creatinine, Ser: 1.18 mg/dL (ref 0.61–1.24)
GFR, Estimated: >60 mL/min (ref >60)
Glucose, Bld: 106 mg/dL — ABNORMAL HIGH (ref 70–99)
Potassium: 3.3 mmol/L — ABNORMAL LOW (ref 3.5–5.1)
Sodium: 131 mmol/L — ABNORMAL LOW (ref 135–145)
Total Bilirubin: 6.1 mg/dL — ABNORMAL HIGH (ref 0.3–1.2)
Total Protein: 6.1 g/dL — ABNORMAL LOW (ref 6.5–8.1)

## 2020-12-29 LAB — URINALYSIS, ROUTINE W REFLEX MICROSCOPIC
Bilirubin Urine: NEGATIVE
Glucose, UA: NEGATIVE mg/dL
Hgb urine dipstick: NEGATIVE
Ketones, ur: NEGATIVE mg/dL
Leukocytes,Ua: NEGATIVE
Nitrite: NEGATIVE
Protein, ur: NEGATIVE mg/dL
Specific Gravity, Urine: 1.004 — ABNORMAL LOW (ref 1.005–1.030)
pH: 7 (ref 5.0–8.0)

## 2020-12-29 LAB — AMMONIA: Ammonia: 24 umol/L (ref 9–35)

## 2020-12-29 LAB — LACTIC ACID, PLASMA: Lactic Acid, Venous: 1.7 mmol/L (ref 0.5–1.9)

## 2020-12-29 LAB — POC OCCULT BLOOD, ED: Fecal Occult Bld: NEGATIVE

## 2020-12-29 LAB — COOXEMETRY PANEL
Carboxyhemoglobin: 4 % — ABNORMAL HIGH (ref 0.5–1.5)
Methemoglobin: 0.4 % (ref 0.0–1.5)
O2 Saturation: >100 %
Total hemoglobin: 8.6 g/dL — ABNORMAL LOW (ref 12.0–16.0)

## 2020-12-29 MED ORDER — MAGNESIUM OXIDE -MG SUPPLEMENT 400 (240 MG) MG PO TABS
800.0000 mg | ORAL_TABLET | Freq: Once | ORAL | Status: AC
  Administered 2020-12-29: 800 mg via ORAL
  Filled 2020-12-29: qty 2

## 2020-12-29 NOTE — ED Provider Notes (Signed)
[facility] EMERGENCY DEPARTMENT Provider Note   CSN: [id number] Arrival date & time: 12/29/20  1411     History Chief Complaint  Patient presents with   Weakness    Zachary Mata is a 40 y.o. male.  The history is provided by the patient and medical records.  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 Relieved by:  Rest Ineffective treatments:  None tried Associated symptoms: dizziness   Associated symptoms: no abdominal pain, no arthralgias, no chest pain, no cough, no dysuria, no fever, no seizures, no shortness of breath and no vomiting   Risk factors: anemia        Past Medical History:  Diagnosis Date   Alcohol abuse    Anxiety    Ascites    Chronic back pain 03/02/2012   Cirrhosis, alcoholic (HCC)    COPD (chronic obstructive pulmonary disease) ([place])    Depression    ED (erectile dysfunction)    Esophageal varices without bleeding ([place])    Essential hypertension 03/02/2012   Hepatic encephalopathy    Hypertension     Patient Active Problem List   Diagnosis Date Noted   Dysphagia 10/06/2020   Aphthous ulcer of tongue 07/14/2020   Obstructive uropathy [id number]   Alcoholic cirrhosis of liver without ascites ([place]) [id number]   Alcoholic hepatitis with ascites    Alcohol withdrawal ([place]) 11/26/2019   Liver failure, acute 11/26/2019   Jaundice due to hepatitis 11/25/2019   Hypokalemia [id number]   Alcoholic hepatitis without ascites 11/25/2019   Alcohol cessation counseling 11/25/2019   Infertility male 06/22/2019   Allergic conjunctivitis and rhinitis, bilateral 04/09/2019   Transaminitis 03/03/2019   Normocytic anemia 03/03/2019   Erectile dysfunction 02/26/2019   Anxiety and depression 02/26/2019   COPD (chronic obstructive pulmonary disease) 02/26/2019   Muscle spasm  02/26/2019   Smoker 02/26/2019   Excessive daytime sleepiness 02/26/2019   Alcohol abuse    Mental health problem    Annual physical exam 06/19/2012   Essential hypertension 03/02/2012   Chronic back pain 03/02/2012    Past Surgical History:  Procedure Laterality Date   GANGLION CYST EXCISION     IR PARACENTESIS  11/25/2019   WRIST SURGERY         Family History  Problem Relation Age of Onset   Diabetes Mother    Breast cancer Mother    Alcohol abuse Father    Cirrhosis Father    Breast cancer Maternal Aunt    Liver cancer Maternal Aunt    Cirrhosis Maternal Aunt    Hepatitis C Maternal Aunt    Colon cancer Neg Hx    Stomach cancer Neg Hx    Pancreatic cancer Neg Hx     Social History   Tobacco Use   Smoking status: Every Day    Packs/day: 2.00    Years: 15.00    Pack years: 30.00    Types: Cigarettes   Smokeless tobacco: Never  Vaping Use   Vaping Use: Never used  Substance Use Topics   Alcohol use: Not Currently    Alcohol/week: 6.0 - 7.0 standard drinks    Types: 6 - 7 Cans of beer per week    Comment: Stopped October 2021   Drug use: Not Currently    Types: Marijuana    Home Medications Prior to Admission medications   Medication  Sig Start Date End Date Taking? Authorizing Provider  ARIPiprazole (ABILIFY) 10 MG tablet Take 1 tablet (10 mg total) by mouth daily. 08/25/20  Yes Silverio Decamp, MD  calcium carbonate (OS-CAL) 1250 (500 Ca) MG chewable tablet Chew 1 tablet (1,250 mg total) by mouth 2 (two) times daily. 07/18/20  Yes Silverio Decamp, MD  cetirizine (ZYRTEC) 10 MG tablet Take 10 mg by mouth daily.   Yes [provider]  escitalopram (LEXAPRO) 20 MG tablet TAKE 1 TABLET BY MOUTH EVERY DAY Patient taking differently: Take 20 mg by mouth daily. 05/30/20  Yes Silverio Decamp, MD  furosemide (LASIX) 40 MG tablet Take 1 tablet (40 mg total) by mouth daily. 12/25/20  Yes Danis, Kirke Corin, MD  gabapentin (NEURONTIN) 300  MG capsule TAKE 1 CAPSULE BY MOUTH TWICE A DAY Patient taking differently: Take 300 mg by mouth 2 (two) times daily. 11/12/20  Yes Silverio Decamp, MD  hydrOXYzine (ATARAX/VISTARIL) 25 MG tablet TAKE 1 TABLET BY MOUTH TWICE A DAY AS NEEDED Patient taking differently: Take 25 mg by mouth 2 (two) times daily as needed. 09/24/20  Yes Silverio Decamp, MD  ipratropium (ATROVENT) 0.06 % nasal spray Place 2 sprays into both nostrils 4 (four) times daily. 06/06/20  Yes Silverio Decamp, MD  magnesium oxide (MAG-OX) 400 MG tablet Take 2 tablets (800 mg total) by mouth at bedtime. 08/25/20  Yes Silverio Decamp, MD  mirtazapine (REMERON) 15 MG tablet TAKE 1 TABLET BY MOUTH EVERYDAY AT BEDTIME Patient taking differently: Take 15 mg by mouth at bedtime. 09/28/20  Yes Silverio Decamp, MD  nadolol (CORGARD) 20 MG tablet TAKE 1 TABLET BY MOUTH EVERY DAY Patient taking differently: Take 20 mg by mouth daily. 09/04/20  Yes Danis, Estill Cotta III, MD  pantoprazole (PROTONIX) 40 MG tablet TAKE 1 TABLET BY MOUTH EVERY DAY Patient taking differently: Take 40 mg by mouth daily. 11/13/20  Yes Danis, Kirke Corin, MD  spironolactone (ALDACTONE) 100 MG tablet Take 1 tablet (100 mg total) by mouth daily. 06/06/20  Yes Silverio Decamp, MD  triamcinolone (KENALOG) 0.1 % paste Use as directed 1 application in the mouth or throat 2 (two) times daily. 07/14/20  Yes Silverio Decamp, MD  triamcinolone (NASACORT) 55 MCG/ACT AERO nasal inhaler Place 2 sprays into the nose daily. 06/06/20  Yes Silverio Decamp, MD  XIFAXAN 550 MG TABS tablet Take 550 mg by mouth 2 (two) times daily. 09/05/20  Yes [provider]  Zinc 50 MG TABS Take 50 mg by mouth daily. 08/22/20  Yes [provider]  zolpidem (AMBIEN) 5 MG tablet TAKE 1 TABLET BY MOUTH AT BEDTIME AS NEEDED FOR SLEEP. Patient taking differently: Take 5 mg by mouth at bedtime as needed for sleep. 11/12/20  Yes Silverio Decamp,  MD  lactulose (CHRONULAC) 10 GM/15ML solution Take 45 mLs (30 g total) by mouth daily. 06/06/20 09/04/20  Silverio Decamp, MD    Allergies    Sulfa antibiotics  Review of Systems   Review of Systems  Constitutional:  Positive for fatigue. Negative for chills and fever.  HENT:  Negative for ear pain and sore throat.   Eyes:  Negative for pain and visual disturbance.  Respiratory:  Negative for cough and shortness of breath.   Cardiovascular:  Negative for chest pain and palpitations.  Gastrointestinal:  Negative for abdominal pain and vomiting.  Genitourinary:  Negative for dysuria and hematuria.  Musculoskeletal:  Negative for  arthralgias and back pain.  Skin:  Negative for color change and rash.  Neurological:  Positive for dizziness, weakness and light-headedness. Negative for seizures and syncope.  All other systems reviewed and are negative.  Physical Exam Updated Vital Signs BP 98/61   Pulse (!) 57   Temp 98.6 F (37 C)   Resp 13   Ht 5\' 6"  (1.676 m)   Wt 67.9 kg   SpO2 (!) 2%   BMI 24.16 kg/m   Physical Exam Vitals and nursing note reviewed.  Constitutional:      General: He is not in acute distress.    Appearance: Normal appearance. He is well-developed.  HENT:     Head: Normocephalic and atraumatic.     Right Ear: External ear normal.     Left Ear: External ear normal.     Nose: Nose normal. No congestion.     Mouth/Throat:     Mouth: Mucous membranes are moist.     Pharynx: Oropharynx is clear. No posterior oropharyngeal erythema.  Eyes:     General: Scleral icterus present.     Extraocular Movements: Extraocular movements intact.     Conjunctiva/sclera: Conjunctivae normal.     Pupils: Pupils are equal, round, and reactive to light.  Cardiovascular:     Rate and Rhythm: Normal rate and regular rhythm.     Pulses: Normal pulses.     Heart sounds: No murmur heard. Pulmonary:     Effort: Pulmonary effort is normal. No respiratory distress.      Breath sounds: Normal breath sounds. No wheezing, rhonchi or rales.  Abdominal:     General: Abdomen is flat. Bowel sounds are normal. There is no distension (No fluid wave).     Palpations: Abdomen is soft.     Tenderness: There is no abdominal tenderness. There is no guarding or rebound.  Musculoskeletal:        General: No swelling, tenderness or deformity. Normal range of motion.     Cervical back: Normal range of motion and neck supple. No rigidity.  Skin:    General: Skin is warm and dry.     Capillary Refill: Capillary refill takes less than 2 seconds.     Coloration: Skin is pale.     Findings: No rash.  Neurological:     General: No focal deficit present.     Mental Status: He is alert and oriented to person, place, and time. Mental status is at baseline.     Cranial Nerves: No cranial nerve deficit.     Sensory: No sensory deficit.     Motor: No weakness.     Coordination: Coordination normal.     Gait: Gait normal.  Psychiatric:        Mood and Affect: Mood normal.    ED Results / Procedures / Treatments   Labs (all labs ordered are listed, but only abnormal results are displayed) Labs Reviewed  CBC WITH DIFFERENTIAL/PLATELET - Abnormal; Notable for the following components:      Result Value   RBC 2.49 (*)    Hemoglobin 8.0 (*)    HCT 24.9 (*)    RDW 17.4 (*)    Platelets 95 (*)    Monocytes Absolute 1.1 (*)    Abs Immature Granulocytes 0.09 (*)    All other components within normal limits  COMPREHENSIVE METABOLIC PANEL - Abnormal; Notable for the following components:   Sodium 131 (*)    Potassium 3.3 (*)    Glucose, Bld 106 (*)  Calcium 7.4 (*)    Total Protein 6.1 (*)    Albumin 2.0 (*)    AST 46 (*)    Alkaline Phosphatase 138 (*)    Total Bilirubin 6.1 (*)    All other components within normal limits  MAGNESIUM - Abnormal; Notable for the following components:   Magnesium 1.5 (*)    All other components within normal limits  URINALYSIS, ROUTINE  W REFLEX MICROSCOPIC - Abnormal; Notable for the following components:   Specific Gravity, Urine 1.004 (*)    All other components within normal limits  COOXEMETRY PANEL - Abnormal; Notable for the following components:   Total hemoglobin 8.6 (*)    Carboxyhemoglobin 4.0 (*)    All other components within normal limits  AMMONIA  LACTIC ACID, PLASMA  CARBON MONOXIDE, BLOOD (PERFORMED AT REF LAB)  POC OCCULT BLOOD, ED  TYPE AND SCREEN    EKG EKG Interpretation  Date/Time:  Friday December 29 2020 14:20:45 EST Ventricular Rate:  57 PR Interval:  154 QRS Duration: 99 QT Interval:  528 QTC Calculation: 515 R Axis:   -11 Text Interpretation: Sinus rhythm Borderline T abnormalities, inferior leads Prolonged QT interval Confirmed by Norman Clay ([id number]) on 12/29/2020 3:30:50 PM  Radiology No results found.  Procedures Procedures   Medications Ordered in ED Medications  magnesium oxide (MAG-OX) tablet 800 mg (800 mg Oral Given 12/29/20 1724)    ED Course  I have reviewed the triage vital signs and the nursing notes.  Pertinent labs & imaging results that were available during my care of the patient were reviewed by me and considered in my medical decision making (see chart for details).    MDM Rules/Calculators/A&P                          40 y/o male PMHx sig for for etoh abuse, alcholic cirrhosis, varices, htn.  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  He was reportedly working with fragrant substances in a poorly ventilated area.  He became lightheaded and confused for a period time.  This has since resolved.  He is back to baseline.  No seizure-like activity.  No syncope.  EKG showed borderline sinus bradycardia, but normal axis, no acute ST or T wave changes. He has had no recent infectious symptoms.  No fever on arrival.  He has no ascites on exam.  Low concern for SBP.  He has had no recent  hematemesis.  Low concern for variceal bleeding.  No melena or hematochezia.  Guaiac exam negative. W/u reviewed. Lactic acid normal. Carboxyhgb 4.0 c/w known tobacco use. Low concern for CO poisoning. Send out CO level pending. Ammonia nml. LFTs c/w prior. Low concern for hepatic encephalopathy. Mag low s/p po repletion. Hgb stable at 8.0. UA showed no signs of acute infxn. Cr at baseline. Patient reassessed. Resting comfortably. Remains afebrile, vitally stable, satting appropriately on RA. Able to stand and ambulate w/o recurrent dizzines. Appropriate for dc home w/ close f/u w/ pcp for reassesment. Strict return precautions provided.   Final Clinical Impression(s) / ED Diagnoses Final diagnoses:  Weakness  Near syncope    Rx / DC Orders ED Discharge Orders     None        Lutricia Feil, MD 12/30/20 1154    Cheryll Cockayne, MD 01/03/21 1428

## 2020-12-29 NOTE — ED Triage Notes (Signed)
Pt was in his garage burning wax and oil when pt suddenly felt weak, dizzy and unable to walk now. Also reports headache. Hx of liver cirrhosis and failure. Alert and oriented x 4 but drowsy with EMS.

## 2021-01-01 ENCOUNTER — Telehealth: Payer: Self-pay | Admitting: General Practice

## 2021-01-01 LAB — CARBON MONOXIDE, BLOOD (PERFORMED AT REF LAB): Carbon Monoxide, Blood: 7.1 % — ABNORMAL HIGH (ref 0.0–3.6)

## 2021-01-01 NOTE — Telephone Encounter (Signed)
Transition Care Management Unsuccessful Follow-up Telephone Call  Date of discharge and from where:  12/29/20 from [facility]  Attempts:  1st Attempt  Reason for unsuccessful TCM follow-up call:  Left voice message

## 2021-01-01 NOTE — Telephone Encounter (Signed)
Transition Care Management Follow-up Telephone Call Date of discharge and from where: 12/29/20 from [facility] How have you been since you were released from the hospital? Doing better. Any questions or concerns? No  Items Reviewed: Did the pt receive and understand the discharge instructions provided? Yes  Medications obtained and verified? No  Other? No  Any new allergies since your discharge? No  Dietary orders reviewed? Yes Do you have support at home? Yes   Home Care and Equipment/Supplies: Were home health services ordered? no  Functional Questionnaire: (I = Independent and D = Dependent) ADLs: I  Bathing/Dressing- I  Meal Prep- I  Eating- I  Maintaining continence- I  Transferring/Ambulation- I  Managing Meds- I  Follow up appointments reviewed:  PCP Hospital f/u appt confirmed? Yes  Scheduled to see Dr. Karie Schwalbe on 01/05/21 @ 1130. Specialist Hospital f/u appt confirmed? No   Are transportation arrangements needed? No  If their condition worsens, is the pt aware to call PCP or go to the Emergency Dept.? Yes Was the patient provided with contact information for the PCP's office or ED? Yes Was to pt encouraged to call back with questions or concerns? Yes

## 2021-01-05 ENCOUNTER — Other Ambulatory Visit: Payer: Self-pay

## 2021-01-05 ENCOUNTER — Ambulatory Visit (INDEPENDENT_AMBULATORY_CARE_PROVIDER_SITE_OTHER): Payer: Commercial Managed Care - PPO | Admitting: Sports Medicine

## 2021-01-05 ENCOUNTER — Other Ambulatory Visit: Payer: Self-pay | Admitting: Sports Medicine

## 2021-01-05 ENCOUNTER — Encounter: Payer: Self-pay | Admitting: Sports Medicine

## 2021-01-05 VITALS — BP 107/68 | HR 81 | Ht 66.0 in | Wt 150.0 lb

## 2021-01-05 DIAGNOSIS — K7030 Alcoholic cirrhosis of liver without ascites: Secondary | ICD-10-CM | POA: Diagnosis not present

## 2021-01-05 DIAGNOSIS — R55 Syncope and collapse: Secondary | ICD-10-CM

## 2021-01-05 DIAGNOSIS — R42 Dizziness and giddiness: Secondary | ICD-10-CM | POA: Diagnosis not present

## 2021-01-05 LAB — HEMOCCULT GUIAC POC 1CARD (OFFICE): Fecal Occult Blood, POC: POSITIVE — AB

## 2021-01-05 LAB — POCT HEMOGLOBIN: Hemoglobin: 6.8 g/dL — AB (ref 11–14.6)

## 2021-01-05 MED ORDER — ONDANSETRON 8 MG PO TBDP: 8.0000 mg | ORAL_TABLET | Freq: Three times a day (TID) | ORAL | 3 refills | Status: DC | PRN

## 2021-01-05 NOTE — Progress Notes (Signed)
    Procedures performed today:    None.  Independent interpretation of notes and tests performed by another provider:   None.  Brief History, Exam, Impression, and Recommendations:    Postural dizziness with presyncope This is a 40 year old male, history of end-stage alcoholic cirrhosis with portal hypertension, currently comanaged with gastroenterology. Not a transplant candidate. He does have baseline anemia of chronic disease, he is also on a beta-blocker for portal hypertension. More recently he had an episode where he started to feel weak, dizzy, sweaty, nauseated. At the time his pulse rate was checked and was in the 40s. He was seen in the emergency department, pulse rate into the 50s, he was investigated appropriately, guaiac negative, hemoglobin relatively stable. He had a hemoglobin of 8 down from 8.3 two weeks prior, above the transfusion threshold of 7. He returns today doing a bit better. I did explain to him that he likely had an episode of orthostatic hypotension, likely exacerbated by his concurrent anemia, as well as fluid shifts from his hepatic failure. He is also on fluid restrictions. Considering his pulse rate I do think we should decrease his nadolol dose to one half tab daily, have also given him some advice on what to do when another episode occurs such as laying flat with his feet up. In addition we did check his point-of-care hemoglobin, 6.8 today, guaiac positive. I am going to set him up with hematology for a 2 unit transfusion. I would also like him to touch base with his gastroenterologist urgently.  We did consider admission during this office visit today.   ___________________________________________ Ihor Austin. Benjamin Stain, M.D., ABFM., CAQSM. Primary Care and Sports Medicine [facility]  Adjunct Instructor of Family Medicine  [employer]

## 2021-01-05 NOTE — Assessment & Plan Note (Addendum)
This is a 40 year old male, history of end-stage alcoholic cirrhosis with portal hypertension, currently comanaged with gastroenterology. Not a transplant candidate. He does have baseline anemia of chronic disease, he is also on a beta-blocker for portal hypertension. More recently he had an episode where he started to feel weak, dizzy, sweaty, nauseated. At the time his pulse rate was checked and was in the 40s. He was seen in the emergency department, pulse rate into the 50s, he was investigated appropriately, guaiac negative, hemoglobin relatively stable. He had a hemoglobin of 8 down from 8.3 two weeks prior, above the transfusion threshold of 7. He returns today doing a bit better. I did explain to him that he likely had an episode of orthostatic hypotension, likely exacerbated by his concurrent anemia, as well as fluid shifts from his hepatic failure. He is also on fluid restrictions. Considering his pulse rate I do think we should decrease his nadolol dose to one half tab daily, have also given him some advice on what to do when another episode occurs such as laying flat with his feet up. In addition we did check his point-of-care hemoglobin, 6.8 today, guaiac positive. I am going to set him up with hematology for a 2 unit transfusion. I would also like him to touch base with his gastroenterologist urgently.

## 2021-01-08 ENCOUNTER — Encounter: Payer: Self-pay | Admitting: Hematology & Oncology

## 2021-01-08 ENCOUNTER — Inpatient Hospital Stay: Payer: Commercial Managed Care - PPO | Attending: Sports Medicine

## 2021-01-08 ENCOUNTER — Inpatient Hospital Stay (HOSPITAL_BASED_OUTPATIENT_CLINIC_OR_DEPARTMENT_OTHER): Payer: Commercial Managed Care - PPO | Admitting: Hematology & Oncology

## 2021-01-08 ENCOUNTER — Telehealth: Payer: Self-pay | Admitting: *Deleted

## 2021-01-08 ENCOUNTER — Other Ambulatory Visit: Payer: Self-pay | Admitting: *Deleted

## 2021-01-08 ENCOUNTER — Other Ambulatory Visit: Payer: Self-pay

## 2021-01-08 VITALS — BP 97/43 | HR 66 | Temp 98.7°F | Resp 16 | Wt 149.0 lb

## 2021-01-08 DIAGNOSIS — D500 Iron deficiency anemia secondary to blood loss (chronic): Secondary | ICD-10-CM

## 2021-01-08 DIAGNOSIS — K7030 Alcoholic cirrhosis of liver without ascites: Secondary | ICD-10-CM

## 2021-01-08 DIAGNOSIS — K922 Gastrointestinal hemorrhage, unspecified: Secondary | ICD-10-CM

## 2021-01-08 DIAGNOSIS — Z79899 Other long term (current) drug therapy: Secondary | ICD-10-CM | POA: Insufficient documentation

## 2021-01-08 LAB — CBC WITH DIFFERENTIAL (CANCER CENTER ONLY)
Abs Immature Granulocytes: 0.29 10*3/uL — ABNORMAL HIGH (ref 0.00–0.07)
Basophils Absolute: 0.1 10*3/uL (ref 0.0–0.1)
Basophils Relative: 1 %
Eosinophils Absolute: 0.6 10*3/uL — ABNORMAL HIGH (ref 0.0–0.5)
Eosinophils Relative: 4 %
HCT: 17.9 % — ABNORMAL LOW (ref 39.0–52.0)
Hemoglobin: 5.7 g/dL — CL (ref 13.0–17.0)
Immature Granulocytes: 2 %
Lymphocytes Relative: 22 %
Lymphs Abs: 3.3 10*3/uL (ref 0.7–4.0)
MCH: 33.1 pg (ref 26.0–34.0)
MCHC: 31.8 g/dL (ref 30.0–36.0)
MCV: 104.1 fL — ABNORMAL HIGH (ref 80.0–100.0)
Monocytes Absolute: 2.1 10*3/uL — ABNORMAL HIGH (ref 0.1–1.0)
Monocytes Relative: 14 %
Neutro Abs: 8.8 10*3/uL — ABNORMAL HIGH (ref 1.7–7.7)
Neutrophils Relative %: 57 %
Platelet Count: 105 10*3/uL — ABNORMAL LOW (ref 150–400)
RBC: 1.72 MIL/uL — ABNORMAL LOW (ref 4.22–5.81)
RDW: 19.1 % — ABNORMAL HIGH (ref 11.5–15.5)
WBC Count: 15.2 10*3/uL — ABNORMAL HIGH (ref 4.0–10.5)
nRBC: 0.1 % (ref 0.0–0.2)

## 2021-01-08 LAB — CMP (CANCER CENTER ONLY)
ALT: 31 U/L (ref 0–44)
AST: 29 U/L (ref 15–41)
Albumin: 2.3 g/dL — ABNORMAL LOW (ref 3.5–5.0)
Alkaline Phosphatase: 128 U/L — ABNORMAL HIGH (ref 38–126)
Anion gap: 7 (ref 5–15)
BUN: 18 mg/dL (ref 6–20)
CO2: 25 mmol/L (ref 22–32)
Calcium: 8.3 mg/dL — ABNORMAL LOW (ref 8.9–10.3)
Chloride: 92 mmol/L — ABNORMAL LOW (ref 98–111)
Creatinine: 1.51 mg/dL — ABNORMAL HIGH (ref 0.61–1.24)
GFR, Estimated: 60 mL/min — ABNORMAL LOW (ref >60)
Glucose, Bld: 169 mg/dL — ABNORMAL HIGH (ref 70–99)
Potassium: 4.7 mmol/L (ref 3.5–5.1)
Sodium: 124 mmol/L — ABNORMAL LOW (ref 135–145)
Total Bilirubin: 6.7 mg/dL (ref 0.3–1.2)
Total Protein: 5.4 g/dL — ABNORMAL LOW (ref 6.5–8.1)

## 2021-01-08 LAB — RETICULOCYTES
Immature Retic Fract: 27.5 % — ABNORMAL HIGH (ref 2.3–15.9)
RBC.: 1.69 MIL/uL — ABNORMAL LOW (ref 4.22–5.81)
Retic Count, Absolute: 130.3 10*3/uL (ref 19.0–186.0)
Retic Ct Pct: 7.7 % — ABNORMAL HIGH (ref 0.4–3.1)

## 2021-01-08 LAB — SAVE SMEAR(SSMR), FOR PROVIDER SLIDE REVIEW

## 2021-01-08 LAB — SAMPLE TO BLOOD BANK

## 2021-01-08 LAB — PREPARE RBC (CROSSMATCH)

## 2021-01-08 LAB — VITAMIN B12: Vitamin B-12: 1965 pg/mL — ABNORMAL HIGH (ref 180–914)

## 2021-01-08 LAB — LACTATE DEHYDROGENASE: LDH: 239 U/L — ABNORMAL HIGH (ref 98–192)

## 2021-01-08 NOTE — Telephone Encounter (Signed)
Critical Hgb reported by Brett Canales in lab at 5.7.  Critical Bilirubin at 6.7 reported to Dr Myna Hidalgo.  Seeing patient in the office

## 2021-01-08 NOTE — Progress Notes (Signed)
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  HPI: [name] is a very nice 40 year old white male.  He is in a really tough situation.  He has alcoholic cirrhosis.  He has variceal bleeding.  He has had continued GI blood loss.  [place] so that we could transfuse him.  He has had that he does not wish to have a liver transplant.  He comes in with his wife.  She is in agreement with this.  As such, our goal clearly is to try to improve his quality of life so that he does not feel so poorly.  His liver is not doing well.  His bilirubin is up to 6.7.  His albumin is 2.3.  His hemoglobin is 5.7.  The LDH is 239.  His BUN is 18 creatinine 1.51.    He says that he has melena.  He says on occasion of bright red blood per rectum.  Does not sound like there is much that can be done for him with respect to the GI bleeding.  He has had endoscopies.    He has been on lactulose.  He has had high ammonia levels.  Lactulose seems to be helping this.  He has had no problems with pain.  He has had no cough.  He has had little nausea but no vomiting.  Thankfully, there is no hematemesis.  His last endoscopy was back in February.  His last CT scan was done back in March 2022.  He has splenomegaly.  On a CT scan that was done back in October 2021, he had cirrhosis, he had varices.  His reticulocyte count is 7.7.  When corrected, this is relatively appropriate.  Unfortunately, we do not have any coagulation studies on him.  He and his wife just want him to avoid having to keep going to the emergency room and sitting there all day long.  Overall, I would say his performance status is probably ECOG 2.    Past Medical History:  Diagnosis Date   Alcohol abuse    Anxiety    Ascites    Chronic back pain 03/02/2012   Cirrhosis, alcoholic  (HCC)    COPD (chronic obstructive pulmonary disease) (HCC)    Depression    ED (erectile dysfunction)    Esophageal varices without bleeding ([place])    Essential hypertension 03/02/2012   Hepatic encephalopathy    Hypertension   :   Past Surgical History:  Procedure Laterality Date   GANGLION CYST EXCISION     IR PARACENTESIS  11/25/2019   WRIST SURGERY    :   Current Outpatient Medications:    ARIPiprazole (ABILIFY) 10 MG tablet, Take 1 tablet (10 mg total) by mouth daily., Disp: , Rfl:    calcium carbonate (OS-CAL) 1250 (500 Ca) MG chewable tablet, Chew 1 tablet (1,250 mg total) by mouth 2 (two) times daily., Disp: 30 tablet, Rfl: 1   cetirizine (ZYRTEC) 10 MG tablet, Take 10 mg by mouth daily., Disp: , Rfl:    escitalopram (LEXAPRO) 20 MG tablet, TAKE 1 TABLET BY MOUTH EVERY DAY (Patient taking differently: Take 20 mg by mouth daily.), Disp: 90 tablet, Rfl: 2   furosemide (LASIX) 40 MG tablet, Take 1 tablet (40 mg total) by mouth daily., Disp: 90 tablet, Rfl: 3   gabapentin (NEURONTIN) 300 MG capsule,  TAKE 1 CAPSULE BY MOUTH TWICE A DAY (Patient taking differently: Take 300 mg by mouth 2 (two) times daily.), Disp: 60 capsule, Rfl: 3   hydrOXYzine (ATARAX/VISTARIL) 25 MG tablet, TAKE 1 TABLET BY MOUTH TWICE A DAY AS NEEDED (Patient taking differently: Take 25 mg by mouth 2 (two) times daily as needed.), Disp: 180 tablet, Rfl: 4   ipratropium (ATROVENT) 0.06 % nasal spray, Place 2 sprays into both nostrils 4 (four) times daily., Disp: 15 mL, Rfl: 11   lactulose (CHRONULAC) 10 GM/15ML solution, Take 45 mLs (30 g total) by mouth daily., Disp: 1350 mL, Rfl: 11   magnesium oxide (MAG-OX) 400 MG tablet, Take 2 tablets (800 mg total) by mouth at bedtime., Disp: 180 tablet, Rfl: 3   mirtazapine (REMERON) 15 MG tablet, TAKE 1 TABLET BY MOUTH EVERYDAY AT BEDTIME (Patient taking differently: Take 15 mg by mouth at bedtime.), Disp: 90 tablet, Rfl: 2   nadolol (CORGARD) 20 MG tablet, Take 0.5  tablets (10 mg total) by mouth daily., Disp: , Rfl:    ondansetron (ZOFRAN-ODT) 8 MG disintegrating tablet, Take 1 tablet (8 mg total) by mouth every 8 (eight) hours as needed for nausea., Disp: 20 tablet, Rfl: 3   pantoprazole (PROTONIX) 40 MG tablet, TAKE 1 TABLET BY MOUTH EVERY DAY (Patient taking differently: Take 40 mg by mouth daily.), Disp: 90 tablet, Rfl: 0   spironolactone (ALDACTONE) 100 MG tablet, Take 1 tablet (100 mg total) by mouth daily., Disp: 90 tablet, Rfl: 3   triamcinolone (KENALOG) 0.1 % paste, Use as directed 1 application in the mouth or throat 2 (two) times daily. (Patient not taking: Reported on 01/05/2021), Disp: 5 g, Rfl: 11   triamcinolone (NASACORT) 55 MCG/ACT AERO nasal inhaler, Place 2 sprays into the nose daily., Disp: 1 each, Rfl: 11   XIFAXAN 550 MG TABS tablet, Take 550 mg by mouth 2 (two) times daily., Disp: , Rfl:    Zinc 50 MG TABS, Take 50 mg by mouth daily., Disp: , Rfl:    zolpidem (AMBIEN) 5 MG tablet, TAKE 1 TABLET BY MOUTH AT BEDTIME AS NEEDED FOR SLEEP. (Patient taking differently: Take 5 mg by mouth at bedtime as needed for sleep.), Disp: 30 tablet, Rfl: 1:  :   Allergies  Allergen Reactions   Sulfa Antibiotics Swelling and Other (See Comments)  :   Family History  Problem Relation Age of Onset   Diabetes Mother    Breast cancer Mother    Alcohol abuse Father    Cirrhosis Father    Breast cancer Maternal Aunt    Liver cancer Maternal Aunt    Cirrhosis Maternal Aunt    Hepatitis C Maternal Aunt    Colon cancer Neg Hx    Stomach cancer Neg Hx    Pancreatic cancer Neg Hx   :   Social History   Socioeconomic History   Marital status: Married    Spouse name: Not on file   Number of children: Not on file   Years of education: Not on file   Highest education level: Not on file  Occupational History   Not on file  Tobacco Use   Smoking status: Every Day    Packs/day: 2.00    Years: 15.00    Pack years: 30.00    Types: Cigarettes    Smokeless tobacco: Never  Vaping Use   Vaping Use: Never used  Substance and Sexual Activity   Alcohol use: Not Currently    Alcohol/week: 6.0 - 7.0  standard drinks    Types: 6 - 7 Cans of beer per week    Comment: Stopped October 2021   Drug use: Not Currently    Types: Marijuana   Sexual activity: Yes  Other Topics Concern   Not on file  Social History Narrative   Not on file   Social Determinants of Health   Financial Resource Strain: Not on file  Food Insecurity: Not on file  Transportation Needs: Not on file  Physical Activity: Not on file  Stress: Not on file  Social Connections: Not on file  Intimate Partner Violence: Not on file  :  Review of Systems  Constitutional:  Positive for chills and malaise/fatigue.  HENT: Negative.    Eyes: Negative.   Respiratory:  Positive for shortness of breath.   Cardiovascular:  Positive for palpitations.  Gastrointestinal:  Positive for blood in stool.  Genitourinary: Negative.   Musculoskeletal: Negative.   Skin: Negative.   Neurological:  Positive for weakness.  Endo/Heme/Allergies:  Bruises/bleeds easily.  Psychiatric/Behavioral: Negative.      Exam: @IPVITALS @ This is a jaundiced white male in no obvious distress.  Vital signs are temperature 98.7.  Pulse 66.  Blood pressure 97/43.  Weight is under 49 pounds.  Head and neck exam shows scleral icterus.  He has no adenopathy in the neck.  He has palatal icterus.  His thyroid is nonpalpable.  Lungs are with some decent breath sounds bilaterally.  He has no wheezes.  Cardiac exam regular rate and rhythm.  He has no murmurs, rubs or bruits.  Abdomen is soft.  He is slightly distended.  He has no obvious fluid wave.  I cannot palpate his spleen.  I cannot palpate his liver.  Back exam shows no tenderness over the spine, ribs or hips.  Extremities shows no clubbing, cyanosis or edema.  Skin exam shows jaundice.  Neurological exam shows no obvious neurological deficits.    Recent  Labs    01/08/21 1045  WBC 15.2*  HGB 5.7*  HCT 17.9*  PLT 105*    Recent Labs    01/08/21 1045  NA 124*  K 4.7  CL 92*  CO2 25  GLUCOSE 169*  BUN 18  CREATININE 1.51*  CALCIUM 8.3*    Blood smear review: Has marked anisocytosis and poikilocytosis.  He has no nucleated red blood cells.  He has polychromasia.  I do not see any rouleaux formation.  He has no schistocytes or spherocytes.  He has some teardrop cells.  He has no target cells.  White cells are increased in number.  He has a couple immature myeloid cells.  He has a few increased eosinophils.  I see a couple hypersegmented polys.  Platelets are decreased in number.  I do not see any large platelets.  Pathology: None    Assessment and Plan: Mr. Saltarelli is a really nice 40 year old white male.  He does have some early cuff problem.  He has end-stage liver disease.  Has variceal bleeding.  He bruised the blood through the GI tract.  Apparently, there is nothing I can be done for this.  We are to transfuse him.  He definitely needs to be transfused.  We will set him up for a transfusion tomorrow.  He will be interesting to see what his coagulation studies are.  Maybe if we can try to improve his coagulation parameters this might decrease the bleeding.  Our goal is just plain quality of life.  Again he does not  wish to have a liver transplant.  Again have to watch his blood count weekly.  I think this is just a very tenuous situation for right now.  I just hate this for him.  He seems like a really nice guy.  His wife is incredibly supportive.  I just want to try to improve his quality of life is much as possible.  We will plan to get him back in a week to see how he is doing.  Keflex In the

## 2021-01-09 ENCOUNTER — Encounter: Payer: Self-pay | Admitting: Hematology & Oncology

## 2021-01-09 ENCOUNTER — Inpatient Hospital Stay: Payer: Commercial Managed Care - PPO

## 2021-01-09 DIAGNOSIS — K922 Gastrointestinal hemorrhage, unspecified: Secondary | ICD-10-CM

## 2021-01-09 DIAGNOSIS — K7030 Alcoholic cirrhosis of liver without ascites: Secondary | ICD-10-CM

## 2021-01-09 DIAGNOSIS — D500 Iron deficiency anemia secondary to blood loss (chronic): Secondary | ICD-10-CM

## 2021-01-09 HISTORY — DX: Gastrointestinal hemorrhage, unspecified: K92.2

## 2021-01-09 HISTORY — DX: Iron deficiency anemia secondary to blood loss (chronic): D50.0

## 2021-01-09 LAB — IRON AND TIBC
Iron: 48 ug/dL (ref 42–163)
Saturation Ratios: 19 % — ABNORMAL LOW (ref 20–55)
TIBC: 253 ug/dL (ref 202–409)
UIBC: 204 ug/dL (ref 117–376)

## 2021-01-09 LAB — FERRITIN: Ferritin: 51 ng/mL (ref 24–336)

## 2021-01-09 LAB — COLD AGGLUTININ TITER: Cold Agglutinin Titer: NEGATIVE

## 2021-01-09 MED ORDER — ACETAMINOPHEN 325 MG PO TABS
650.0000 mg | ORAL_TABLET | Freq: Once | ORAL | Status: AC
  Administered 2021-01-09: 650 mg via ORAL
  Filled 2021-01-09: qty 2

## 2021-01-09 MED ORDER — DIPHENHYDRAMINE HCL 25 MG PO CAPS
25.0000 mg | ORAL_CAPSULE | Freq: Once | ORAL | Status: AC
  Administered 2021-01-09: 25 mg via ORAL
  Filled 2021-01-09: qty 1

## 2021-01-09 MED ORDER — SODIUM CHLORIDE 0.9% IV SOLUTION
250.0000 mL | Freq: Once | INTRAVENOUS | Status: AC
  Administered 2021-01-09: 250 mL via INTRAVENOUS

## 2021-01-09 NOTE — Addendum Note (Signed)
Addended by: Josph Macho on: 01/09/2021 08:34 AM   Modules accepted: Orders

## 2021-01-09 NOTE — Patient Instructions (Signed)
Anemia Anemia is a condition in which there is not enough red blood cells or hemoglobin in the blood. Hemoglobin is a substance in red blood cells that carries oxygen. When you do not have enough red blood cells or hemoglobin (are anemic), your body cannot get enough oxygen and your organs may not work properly. As a result, you may feel very tired or have other problems. What are the causes? Common causes of anemia include: Excessive bleeding. Anemia can be caused by excessive bleeding inside or outside the body, including bleeding from the intestines or from heavy menstrual periods in females. Poor nutrition. Long-lasting (chronic) kidney, thyroid, and liver disease. Bone marrow disorders, spleen problems, and blood disorders. Cancer and treatments for cancer. HIV (human immunodeficiency virus) and AIDS (acquired immunodeficiency syndrome). Infections, medicines, and autoimmune disorders that destroy red blood cells. What are the signs or symptoms? Symptoms of this condition include: Minor weakness. Dizziness. Headache, or difficulties concentrating and sleeping. Heartbeats that feel irregular or faster than normal (palpitations). Shortness of breath, especially with exercise. Pale skin, lips, and nails, or cold hands and feet. Indigestion and nausea. Symptoms may occur suddenly or develop slowly. If your anemia is mild, you may not have symptoms. How is this diagnosed? This condition is diagnosed based on blood tests, your medical history, and a physical exam. In some cases, a test may be needed in which cells are removed from the soft tissue inside of a bone and looked at under a microscope (bone marrow biopsy). Your health care provider may also check your stool (feces) for blood and may do additional testing to look for the cause of your bleeding. Other tests may include: Imaging tests, such as a CT scan or MRI. A procedure to see inside your esophagus and stomach (endoscopy). A  procedure to see inside your colon and rectum (colonoscopy). How is this treated? Treatment for this condition depends on the cause. If you continue to lose a lot of blood, you may need to be treated at a hospital. Treatment may include: Taking supplements of iron, vitamin B12, or folic acid. Taking a hormone medicine (erythropoietin) that can help to stimulate red blood cell growth. Having a blood transfusion. This may be needed if you lose a lot of blood. Making changes to your diet. Having surgery to remove your spleen. Follow these instructions at home: Take over-the-counter and prescription medicines only as told by your health care provider. Take supplements only as told by your health care provider. Follow any diet instructions that you were given by your health care provider. Keep all follow-up visits as told by your health care provider. This is important. Contact a health care provider if: You develop new bleeding anywhere in the body. Get help right away if: You are very weak. You are short of breath. You have pain in your abdomen or chest. You are dizzy or feel faint. You have trouble concentrating. You have bloody stools, black stools, or tarry stools. You vomit repeatedly or you vomit up blood. These symptoms may represent a serious problem that is an emergency. Do not wait to see if the symptoms will go away. Get medical help right away. Call your local emergency services (911 in the [place] Do not drive yourself to the hospital. Summary Anemia is a condition in which you do not have enough red blood cells or enough of a substance in your red blood cells that carries oxygen (hemoglobin). Symptoms may occur suddenly or develop slowly. If your anemia is   mild, you may not have symptoms. This condition is diagnosed with blood tests, a medical history, and a physical exam. Other tests may be needed. Treatment for this condition depends on the cause of the anemia. This  information is not intended to replace advice given to you by your health care provider. Make sure you discuss any questions you have with your health care provider. Document Revised: 12/29/2018 Document Reviewed: 12/29/2018 Elsevier Patient Education  2022 [employer] Inc[employer]  

## 2021-01-10 ENCOUNTER — Telehealth: Payer: Self-pay | Admitting: *Deleted

## 2021-01-10 LAB — ERYTHROPOIETIN: Erythropoietin: 1668 m[IU]/mL — ABNORMAL HIGH (ref 2.6–18.5)

## 2021-01-10 LAB — BPAM RBC
Blood Product Expiration Date: 202301022359
Blood Product Expiration Date: 202301022359
ISSUE DATE / TIME: 202212060817
ISSUE DATE / TIME: 202212060817
Unit Type and Rh: 5100
Unit Type and Rh: 5100

## 2021-01-10 LAB — TYPE AND SCREEN
ABO/RH(D): O POS
Antibody Screen: NEGATIVE
Unit division: 0
Unit division: 0

## 2021-01-10 NOTE — Telephone Encounter (Signed)
Scheduling message sent for 745 lab appt, possible blood transfusion, and MD appt.  Patient wife did not want to make an appt with our NP but wanted to see Dr Myna Hidalgo.  Explained to her this may not be possible due to his schedule.

## 2021-01-11 ENCOUNTER — Telehealth: Payer: Self-pay | Admitting: *Deleted

## 2021-01-11 NOTE — Telephone Encounter (Signed)
Per 01/08/21 los - called and gave upcoming appointments - confirmed

## 2021-01-13 ENCOUNTER — Other Ambulatory Visit: Payer: Self-pay | Admitting: Sports Medicine

## 2021-01-13 DIAGNOSIS — F32A Depression, unspecified: Secondary | ICD-10-CM

## 2021-01-13 DIAGNOSIS — F419 Anxiety disorder, unspecified: Secondary | ICD-10-CM

## 2021-01-16 ENCOUNTER — Telehealth: Payer: Self-pay | Admitting: *Deleted

## 2021-01-16 ENCOUNTER — Other Ambulatory Visit: Payer: Self-pay

## 2021-01-16 ENCOUNTER — Encounter: Payer: Self-pay | Admitting: Family

## 2021-01-16 ENCOUNTER — Inpatient Hospital Stay: Payer: Commercial Managed Care - PPO

## 2021-01-16 ENCOUNTER — Inpatient Hospital Stay (HOSPITAL_BASED_OUTPATIENT_CLINIC_OR_DEPARTMENT_OTHER): Payer: Commercial Managed Care - PPO | Admitting: Family

## 2021-01-16 ENCOUNTER — Telehealth: Payer: Self-pay

## 2021-01-16 VITALS — BP 116/75 | HR 77 | Temp 98.4°F | Resp 17

## 2021-01-16 VITALS — BP 118/68 | HR 84 | Temp 98.9°F | Resp 18 | Wt 147.8 lb

## 2021-01-16 DIAGNOSIS — K7030 Alcoholic cirrhosis of liver without ascites: Secondary | ICD-10-CM

## 2021-01-16 DIAGNOSIS — D500 Iron deficiency anemia secondary to blood loss (chronic): Secondary | ICD-10-CM

## 2021-01-16 DIAGNOSIS — R1084 Generalized abdominal pain: Secondary | ICD-10-CM | POA: Diagnosis not present

## 2021-01-16 DIAGNOSIS — R791 Abnormal coagulation profile: Secondary | ICD-10-CM

## 2021-01-16 DIAGNOSIS — K922 Gastrointestinal hemorrhage, unspecified: Secondary | ICD-10-CM

## 2021-01-16 LAB — IRON AND TIBC
Iron: 22 ug/dL — ABNORMAL LOW (ref 42–163)
Saturation Ratios: 8 % — ABNORMAL LOW (ref 20–55)
TIBC: 261 ug/dL (ref 202–409)
UIBC: 239 ug/dL (ref 117–376)

## 2021-01-16 LAB — CMP (CANCER CENTER ONLY)
ALT: 35 U/L (ref 0–44)
AST: 41 U/L (ref 15–41)
Albumin: 2.4 g/dL — ABNORMAL LOW (ref 3.5–5.0)
Alkaline Phosphatase: 151 U/L — ABNORMAL HIGH (ref 38–126)
Anion gap: 9 (ref 5–15)
BUN: 21 mg/dL — ABNORMAL HIGH (ref 6–20)
CO2: 22 mmol/L (ref 22–32)
Calcium: 7.9 mg/dL — ABNORMAL LOW (ref 8.9–10.3)
Chloride: 96 mmol/L — ABNORMAL LOW (ref 98–111)
Creatinine: 1.56 mg/dL — ABNORMAL HIGH (ref 0.61–1.24)
GFR, Estimated: 58 mL/min — ABNORMAL LOW (ref >60)
Glucose, Bld: 158 mg/dL — ABNORMAL HIGH (ref 70–99)
Potassium: 4.1 mmol/L (ref 3.5–5.1)
Sodium: 127 mmol/L — ABNORMAL LOW (ref 135–145)
Total Bilirubin: 5.7 mg/dL (ref 0.3–1.2)
Total Protein: 5.6 g/dL — ABNORMAL LOW (ref 6.5–8.1)

## 2021-01-16 LAB — CBC WITH DIFFERENTIAL (CANCER CENTER ONLY)
Abs Immature Granulocytes: 0.07 10*3/uL (ref 0.00–0.07)
Basophils Absolute: 0.1 10*3/uL (ref 0.0–0.1)
Basophils Relative: 1 %
Eosinophils Absolute: 0.3 10*3/uL (ref 0.0–0.5)
Eosinophils Relative: 2 %
HCT: 26.7 % — ABNORMAL LOW (ref 39.0–52.0)
Hemoglobin: 8.6 g/dL — ABNORMAL LOW (ref 13.0–17.0)
Immature Granulocytes: 1 %
Lymphocytes Relative: 20 %
Lymphs Abs: 2.2 10*3/uL (ref 0.7–4.0)
MCH: 31.7 pg (ref 26.0–34.0)
MCHC: 32.2 g/dL (ref 30.0–36.0)
MCV: 98.5 fL (ref 80.0–100.0)
Monocytes Absolute: 1.4 10*3/uL — ABNORMAL HIGH (ref 0.1–1.0)
Monocytes Relative: 13 %
Neutro Abs: 7 10*3/uL (ref 1.7–7.7)
Neutrophils Relative %: 63 %
Platelet Count: 68 10*3/uL — ABNORMAL LOW (ref 150–400)
RBC: 2.71 MIL/uL — ABNORMAL LOW (ref 4.22–5.81)
RDW: 18.5 % — ABNORMAL HIGH (ref 11.5–15.5)
WBC Count: 11 10*3/uL — ABNORMAL HIGH (ref 4.0–10.5)
nRBC: 0 % (ref 0.0–0.2)

## 2021-01-16 LAB — FERRITIN: Ferritin: 62 ng/mL (ref 24–336)

## 2021-01-16 LAB — RETICULOCYTES
Immature Retic Fract: 19.1 % — ABNORMAL HIGH (ref 2.3–15.9)
RBC.: 2.63 MIL/uL — ABNORMAL LOW (ref 4.22–5.81)
Retic Count, Absolute: 154.9 10*3/uL (ref 19.0–186.0)
Retic Ct Pct: 5.9 % — ABNORMAL HIGH (ref 0.4–3.1)

## 2021-01-16 LAB — SAMPLE TO BLOOD BANK

## 2021-01-16 LAB — PROTIME-INR
INR: 2.4 — ABNORMAL HIGH (ref 0.8–1.2)
Prothrombin Time: 25.8 seconds — ABNORMAL HIGH (ref 11.4–15.2)

## 2021-01-16 LAB — PREPARE RBC (CROSSMATCH)

## 2021-01-16 MED ORDER — PHYTONADIONE 5 MG PO TABS: 10.0000 mg | ORAL_TABLET | Freq: Once | ORAL | 2 refills | Status: DC

## 2021-01-16 MED ORDER — SODIUM CHLORIDE 0.9 % IV SOLN: Freq: Once | INTRAVENOUS | Status: AC

## 2021-01-16 MED ORDER — SODIUM CHLORIDE 0.9 % IV SOLN
300.0000 mg | Freq: Once | INTRAVENOUS | Status: AC
  Administered 2021-01-16: 300 mg via INTRAVENOUS
  Filled 2021-01-16: qty 300

## 2021-01-16 MED ORDER — ACETAMINOPHEN 325 MG PO TABS
650.0000 mg | ORAL_TABLET | Freq: Once | ORAL | Status: AC
  Administered 2021-01-16: 650 mg via ORAL

## 2021-01-16 MED ORDER — PHYTONADIONE 5 MG PO TABS: 10.0000 mg | ORAL_TABLET | Freq: Every day | ORAL | 2 refills | Status: DC

## 2021-01-16 MED ORDER — DIPHENHYDRAMINE HCL 25 MG PO CAPS
25.0000 mg | ORAL_CAPSULE | Freq: Once | ORAL | Status: AC
  Administered 2021-01-16: 25 mg via ORAL

## 2021-01-16 NOTE — Progress Notes (Signed)
Pt received iron today

## 2021-01-16 NOTE — Telephone Encounter (Signed)
Critical from lab, total bilirubin 5.7. MD aware

## 2021-01-16 NOTE — Progress Notes (Signed)
Hematology and Oncology Follow Up Visit  [name] 09-Feb-1980 40 y.o. 01/16/2021   Principle Diagnosis:  ETOH cirrhosis  Iron deficiency anemia secondary to GI blood loss   Current Therapy: Transfusional support as needed IV iron as indicated    Interim History:  [name] is here today with his wife for follow-up. He is still feeling fatigued. He is also symptomatic with dizziness, SOB with exertion, chills and palpitations.  He has episodes of n/v every 1-2 days. He does not have much of an appetite and is not eating well. His weight is down 3 lbs in 3 weeks. He is doing his best to hydrate properly while on fluid restrictions and taking a diuretic He had 1 episode in the last week of bright red blood in his stool. He has had the occasional nose bleed as well that he was able to stop on his own.  No other blood loss noted. No petechiae noted on exam.  Skin is dry.  He has minimal swelling in his ankles. Pedal pulses are 2+.  Neuropathy in his lower extremities is unchanged from baseline.  No falls or syncope to report.   ECOG Performance Status: 1 - Symptomatic but completely ambulatory  Medications:  Allergies as of 01/16/2021       Reactions   Sulfa Antibiotics Swelling, Other (See Comments)        Medication List        Accurate as of January 16, 2021  8:31 AM. If you have any questions, ask your nurse or doctor.          ARIPiprazole 10 MG tablet Commonly known as: ABILIFY Take 1 tablet (10 mg total) by mouth daily.   calcium carbonate 1250 (500 Ca) MG chewable tablet Commonly known as: OS-CAL Chew 1 tablet (1,250 mg total) by mouth 2 (two) times daily.   cetirizine 10 MG tablet Commonly known as: ZYRTEC Take 10 mg by mouth daily.   escitalopram 20 MG tablet Commonly known as: LEXAPRO TAKE 1 TABLET BY MOUTH EVERY DAY   furosemide 40 MG tablet Commonly known as: LASIX Take 1 tablet (40 mg total) by mouth daily.   gabapentin 300 MG  capsule Commonly known as: NEURONTIN TAKE 1 CAPSULE BY MOUTH TWICE A DAY   hydrOXYzine 25 MG tablet Commonly known as: ATARAX TAKE 1 TABLET BY MOUTH TWICE A DAY AS NEEDED   ipratropium 0.06 % nasal spray Commonly known as: ATROVENT Place 2 sprays into both nostrils 4 (four) times daily.   lactulose 10 GM/15ML solution Commonly known as: CHRONULAC Take 45 mLs (30 g total) by mouth daily.   magnesium oxide 400 MG tablet Commonly known as: MAG-OX Take 2 tablets (800 mg total) by mouth at bedtime.   mirtazapine 15 MG tablet Commonly known as: REMERON TAKE 1 TABLET BY MOUTH EVERYDAY AT BEDTIME What changed: See the new instructions.   nadolol 20 MG tablet Commonly known as: CORGARD Take 0.5 tablets (10 mg total) by mouth daily.   ondansetron 8 MG disintegrating tablet Commonly known as: ZOFRAN-ODT Take 1 tablet (8 mg total) by mouth every 8 (eight) hours as needed for nausea.   pantoprazole 40 MG tablet Commonly known as: PROTONIX TAKE 1 TABLET BY MOUTH EVERY DAY   spironolactone 100 MG tablet Commonly known as: ALDACTONE Take 1 tablet (100 mg total) by mouth daily.   triamcinolone 0.1 % paste Commonly known as: KENALOG Use as directed 1 application in the mouth or throat 2 (two)  times daily.   triamcinolone 55 MCG/ACT Aero nasal inhaler Commonly known as: NASACORT Place 2 sprays into the nose daily.   Xifaxan 550 MG Tabs tablet Generic drug: rifaximin Take 550 mg by mouth 2 (two) times daily.   Zinc 50 MG Tabs Take 50 mg by mouth daily.   zolpidem 5 MG tablet Commonly known as: AMBIEN TAKE 1 TABLET BY MOUTH EVERY DAY AT BEDTIME AS NEEDED FOR SLEEP        Allergies:  Allergies  Allergen Reactions   Sulfa Antibiotics Swelling and Other (See Comments)    Past Medical History, Surgical history, Social history, and Family History were reviewed and updated.  Review of Systems: All other 10 point review of systems is negative.   Physical Exam:  weight  is 147 lb 12.8 oz (67 kg). His oral temperature is 98.9 F (37.2 C). His blood pressure is 118/68 and his pulse is 84. His respiration is 18 and oxygen saturation is 100%.   Wt Readings from Last 3 Encounters:  01/16/21 147 lb 12.8 oz (67 kg)  01/08/21 149 lb (67.6 kg)  01/05/21 150 lb (68 kg)    Ocular: Sclerae unicteric, pupils equal, round and reactive to light Ear-nose-throat: Oropharynx clear, dentition fair Lymphatic: No cervical or supraclavicular adenopathy Lungs no rales or rhonchi, good excursion bilaterally Heart regular rate and rhythm, no murmur appreciated Abd distended and firm, some tenderness on palpation, positive bowel sounds, fluid wave  MSK no focal spinal tenderness, no joint edema Neuro: non-focal, well-oriented, appropriate affect Breasts: Deferred  Lab Results  Component Value Date   WBC 11.0 (H) 01/16/2021   HGB 8.6 (L) 01/16/2021   HCT 26.7 (L) 01/16/2021   MCV 98.5 01/16/2021   PLT 68 (L) 01/16/2021   Lab Results  Component Value Date   FERRITIN 51 01/08/2021   IRON 48 01/08/2021   TIBC 253 01/08/2021   UIBC 204 01/08/2021   IRONPCTSAT 19 (L) 01/08/2021   Lab Results  Component Value Date   RETICCTPCT 5.9 (H) 01/16/2021   RBC 2.71 (L) 01/16/2021   RBC 2.63 (L) 01/16/2021   No results found for: KPAFRELGTCHN, LAMBDASER, KAPLAMBRATIO Lab Results  Component Value Date   IGGSERUM 2,221 (H) 11/28/2019   No results found for: Will Bonnet, GAMS, MSPIKE, SPEI   Chemistry      Component Value Date/Time   NA 124 (L) 01/08/2021 1045   NA 144 11/06/2017 0927   K 4.7 01/08/2021 1045   CL 92 (L) 01/08/2021 1045   CO2 25 01/08/2021 1045   BUN 18 01/08/2021 1045   BUN 7 11/06/2017 0927   CREATININE 1.51 (H) 01/08/2021 1045   CREATININE 1.20 07/17/2020 1007      Component Value Date/Time   CALCIUM 8.3 (L) 01/08/2021 1045   ALKPHOS 128 (H) 01/08/2021 1045   AST 29 01/08/2021 1045   ALT 31 01/08/2021  1045   BILITOT 6.7 (HH) 01/08/2021 1045       Impression and Plan: [name] is a pleasant 40 yo caucasian gentleman with ETOH cirrhosis end stage and anemia secondary to GI blood loss.  We will give him 1 unit of blood and IV iron today.  He will need a second dose of IV iron next week.  We will also get an US paracentesis this week as well.  He will also start vitamin K 10 mg PO daily for INR 2.4 per MD.  Follow-up in 3 weeks.   Lottie Dawson, NP  12/13/20228:31 AM

## 2021-01-16 NOTE — Patient Instructions (Signed)
Blood Transfusion, Adult °A blood transfusion is a procedure in which you receive blood through an IV tube. You may need this procedure because of: °A bleeding disorder. °An illness. °An injury. °A surgery. °The blood may come from someone else (a donor). You may also be able to donate blood for yourself. The blood given in a transfusion is made up of different types of cells. You may get: °Red blood cells. These carry oxygen to the cells in the body. °White blood cells. These help you fight infections. °Platelets. These help your blood to clot. °Plasma. This is the liquid part of your blood. It carries proteins and other substances through the body. °If you have a clotting disorder, you may also get other types of blood products. °Tell your doctor about: °Any blood disorders you have. °Any reactions you have had during a blood transfusion in the past. °Any allergies you have. °All medicines you are taking, including vitamins, herbs, eye drops, creams, and over-the-counter medicines. °Any surgeries you have had. °Any medical conditions you have. This includes any recent fever or cold symptoms. °Whether you are pregnant or may be pregnant. °What are the risks? °Generally, this is a safe procedure. However, problems may occur. °The most common problems include: °A mild allergic reaction. This includes red, swollen areas of skin (hives) and itching. °Fever or chills. This may be the body's response to new blood cells received. This may happen during or up to 4 hours after the transfusion. °More serious problems may include: °Too much fluid in the lungs. This may cause breathing problems. °A serious allergic reaction. This includes breathing trouble or swelling around the face and lips. °Lung injury. This causes breathing trouble and low oxygen in the blood. This can happen within hours of the transfusion or days later. °Too much iron. This can happen after getting many blood transfusions over a period of time. °An  infection or virus passed through the blood. This is rare. Donated blood is carefully tested before it is given. °Your body's defense system (immune system) trying to attack the new blood cells. This is rare. Symptoms may include fever, chills, nausea, low blood pressure, and low back or chest pain. °Donated cells attacking healthy tissues. This is rare. °What happens before the procedure? °Medicines °Ask your doctor about: °Changing or stopping your normal medicines. This is important. °Taking aspirin and ibuprofen. Do not take these medicines unless your doctor tells you to take them. °Taking over-the-counter medicines, vitamins, herbs, and supplements. °General instructions °Follow instructions from your doctor about what you cannot eat or drink. °You will have a blood test to find out your blood type. The test also finds out what type of blood your body will accept and matches it to the donor type. °If you are going to have a planned surgery, you may be able to donate your own blood. This may be done in case you need a transfusion. °You will have your temperature, blood pressure, and pulse checked. °You may receive medicine to help prevent an allergic reaction. This may be done if you have had a reaction to a transfusion before. This medicine may be given to you by mouth or through an IV tube. °This procedure lasts about 1-4 hours. Plan for the time you need. °What happens during the procedure? ° °An IV tube will be put into one of your veins. °The bag of donated blood will be attached to your IV tube. Then, the blood will enter through your vein. °Your temperature,   blood pressure, and pulse will be checked often. This is done to find early signs of a transfusion reaction. °Tell your nurse right away if you have any of these symptoms: °Shortness of breath or trouble breathing. °Chest or back pain. °Fever or chills. °Red, swollen areas of skin or itching. °If you have any signs or symptoms of a reaction, your  transfusion will be stopped. You may also be given medicine. °When the transfusion is finished, your IV tube will be taken out. °Pressure may be put on the IV site for a few minutes. °A bandage (dressing) will be put on the IV site. °The procedure may vary among doctors and hospitals. °What happens after the procedure? °You will be monitored until you leave the hospital or clinic. This includes checking your temperature, blood pressure, pulse, breathing rate, and blood oxygen level. °Your blood may be tested to see how you are responding to the transfusion. °You may be warmed with fluids or blankets. This is done to keep the temperature of your body normal. °If you have your procedure in an outpatient setting, you will be told whom to contact to report any reactions. °Where to find more information °To learn more, visit the [employer]: redcross.org °Summary °A blood transfusion is a procedure in which you are given blood through an IV tube. °The blood may come from someone else (a donor). You may also be able to donate blood for yourself. °The blood you are given is made up of different blood cells. You may receive red blood cells, platelets, plasma, or white blood cells. °Your temperature, blood pressure, and pulse will be checked often. °After the procedure, your blood may be tested to see how you are responding. °This information is not intended to replace advice given to you by your health care provider. Make sure you discuss any questions you have with your health care provider. °Document Revised: 07/16/2018 Document Reviewed: 07/16/2018 °Elsevier Patient Education © 2022 [employer] ° °

## 2021-01-16 NOTE — Telephone Encounter (Signed)
Per 01/16/21 los - gave upcoming appointments - confirmed °

## 2021-01-17 ENCOUNTER — Ambulatory Visit: Payer: Commercial Managed Care - PPO | Admitting: Family

## 2021-01-17 ENCOUNTER — Other Ambulatory Visit: Payer: Commercial Managed Care - PPO

## 2021-01-17 LAB — TYPE AND SCREEN
ABO/RH(D): O POS
Antibody Screen: NEGATIVE
Unit division: 0

## 2021-01-17 LAB — BPAM RBC
Blood Product Expiration Date: 202301102359
ISSUE DATE / TIME: 202212131148
Unit Type and Rh: 5100

## 2021-01-19 ENCOUNTER — Ambulatory Visit
Admission: RE | Admit: 2021-01-19 | Discharge: 2021-01-19 | Disposition: A | Discharge status: Home / Self Care | Payer: Commercial Managed Care - PPO | Source: Ambulatory Visit | Attending: Gastroenterology | Admitting: Gastroenterology

## 2021-01-19 DIAGNOSIS — K7031 Alcoholic cirrhosis of liver with ascites: Secondary | ICD-10-CM

## 2021-01-19 DIAGNOSIS — R772 Abnormality of alphafetoprotein: Secondary | ICD-10-CM

## 2021-01-19 IMAGING — MR MR ABDOMEN WO/W CM
17 series · 48 of 48 positions shown · IV contrast (multihance)
Comparison: Multiple priors including most recent CT [DATE]
and MRI [DATE] .

CLINICAL DATA: Cirrhosis, elevated AFP, HCC screen

EXAM:
MRI ABDOMEN WITHOUT AND WITH CONTRAST
TECHNIQUE: Multiplanar multisequence MR imaging of the abdomen was performed
both before and after the administration of intravenous contrast.
CONTRAST:  14mL MULTIHANCE GADOBENATE DIMEGLUMINE 529 MG/ML IV SOLN

[Series 3: T2 · coronal · 5.0mm · 1.56mm/px · 2 of 40 slices shown (1 of 3)]
[im 1/40]
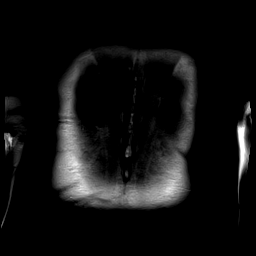
[im 40/40]
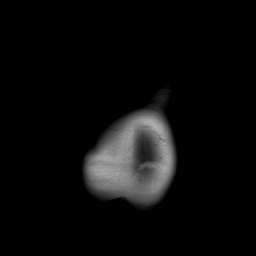

[Series 4: T1 · axial · 3.0mm · 1.19mm/px · z∈[-133,+80]mm · 6 of 144 slices shown]
[im 1/144]
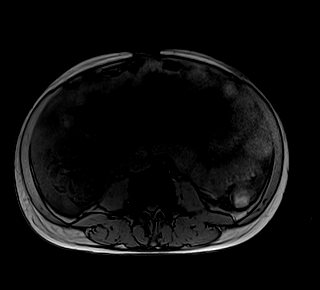
[im 29/144]
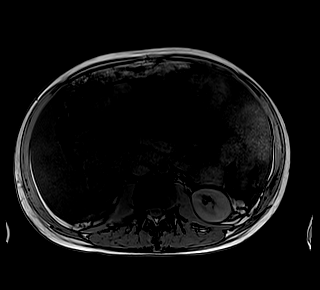
[im 58/144]
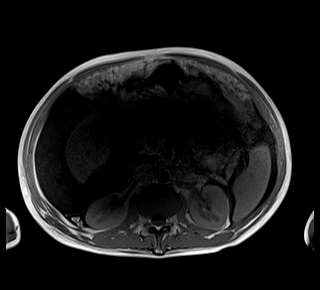
[im 86/144]
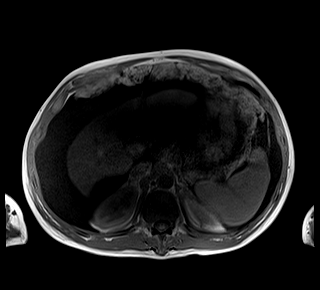
[im 115/144]
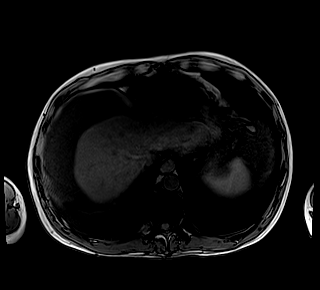
[im 144/144]
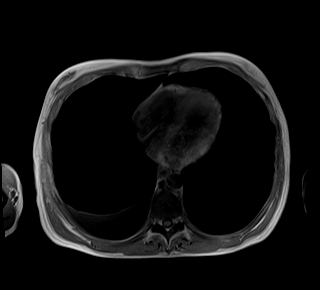

[Series 5: bSSFP · axial · 5.0mm · 1.25mm/px · z∈[-144,+78]mm · 2 of 38 slices shown]
[im 1/38]
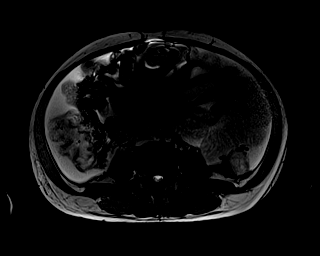
[im 38/38]
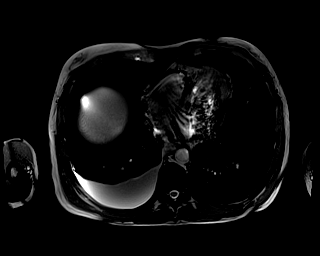

[Series 6: T2 · axial · 5.0mm · 1.48mm/px · z∈[-96,+126]mm · 2 of 38 slices shown (2 of 3)]
[im 1/38]
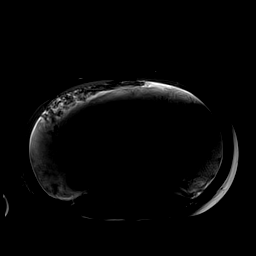
[im 38/38]
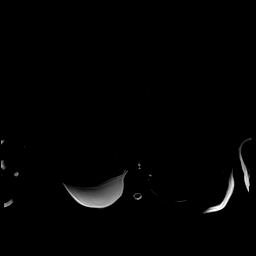

[Series 7: DWI · axial · 5.0mm · 1.42mm/px · z∈[-88,+122]mm · 4 of 108 slices shown (1 of 2)]
[im 1/108]
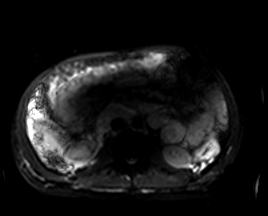
[im 36/108]
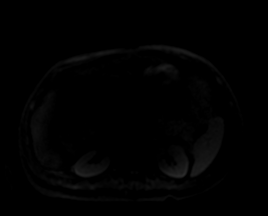
[im 72/108]
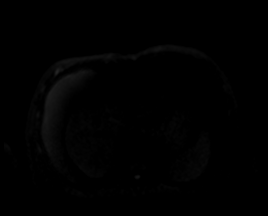
[im 108/108]
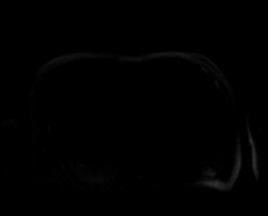

[Series 8: DWI · axial · 5.0mm · 1.42mm/px · 1 of 36 slices shown (2 of 2)]
[im 1/36]
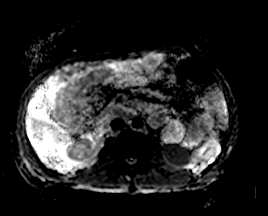

[Series 9: T2 · axial · 6.0mm · 1.19mm/px · 1 of 30 slices shown (3 of 3)]
[im 1/30]
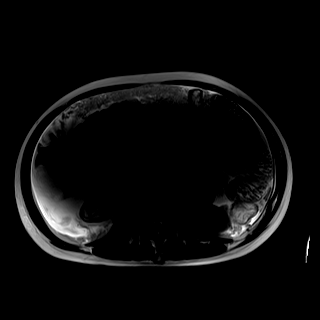

[Series 10: T1 dynamic · axial · non-contrast · 3.0mm · 1.25mm/px · z∈[-121,+92]mm · 3 of 72 slices shown]
[im 1/72]
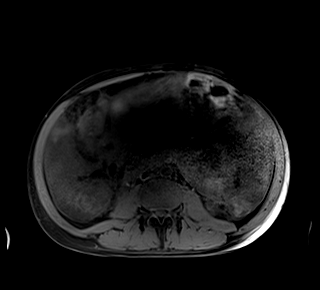
[im 36/72]
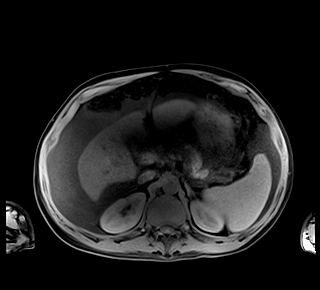
[im 72/72]
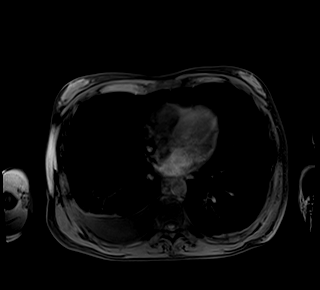

[Series 11: T1 dynamic post-contrast · axial · 3.0mm · 1.25mm/px · z∈[-121,+92]mm · 3 of 72 slices shown (1 of 9)]
[im 1/72]
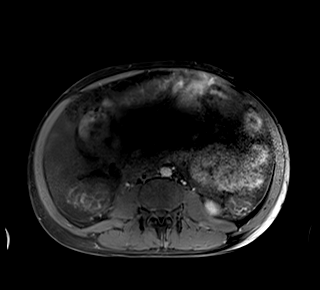
[im 36/72]
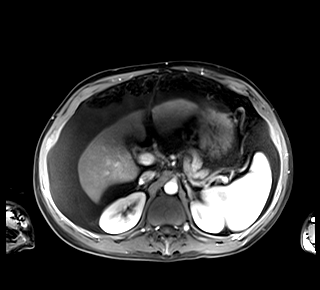
[im 72/72]
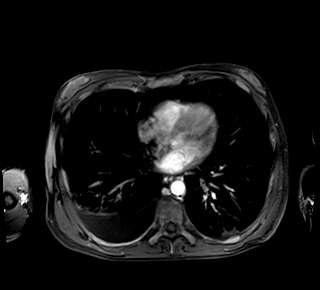

[Series 12: T1 dynamic post-contrast · axial · 3.0mm · 1.25mm/px · z∈[-121,+92]mm · 3 of 72 slices shown (2 of 9)]
[im 1/72]
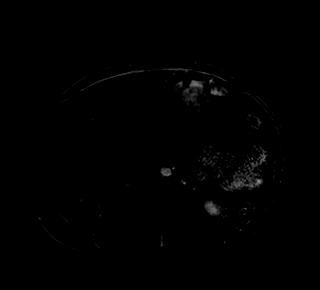
[im 36/72]
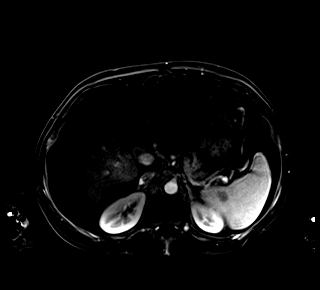
[im 72/72]
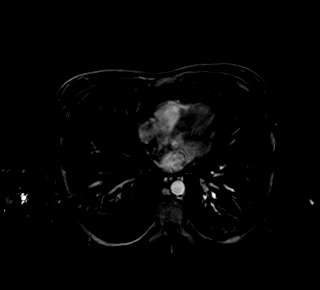

[Series 13: T1 dynamic post-contrast · axial · 3.0mm · 1.25mm/px · z∈[-121,+92]mm · 3 of 72 slices shown (3 of 9)]
[im 1/72]
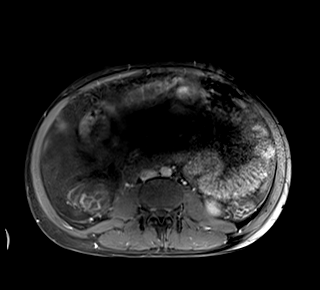
[im 36/72]
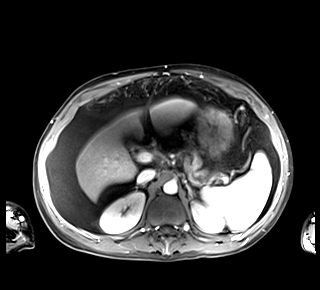
[im 72/72]
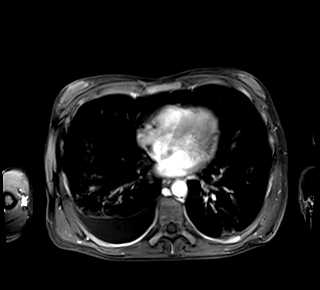

[Series 14: T1 dynamic post-contrast · axial · 3.0mm · 1.25mm/px · z∈[-121,+92]mm · 3 of 72 slices shown (4 of 9)]
[im 1/72]
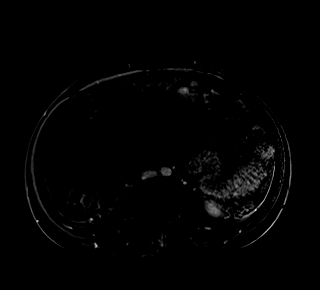
[im 36/72]
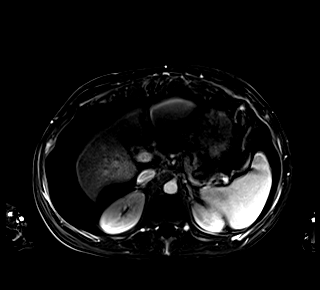
[im 72/72]
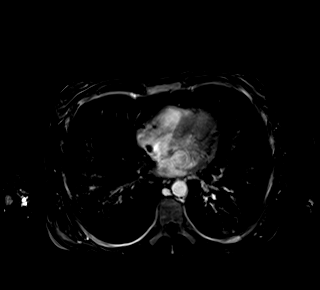

[Series 15: T1 dynamic post-contrast · axial · 3.0mm · 1.25mm/px · z∈[-121,+92]mm · 3 of 72 slices shown (5 of 9)]
[im 1/72]
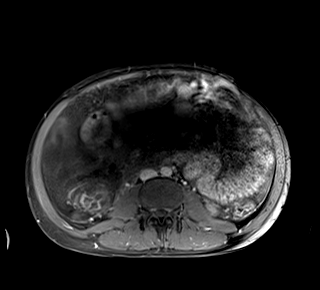
[im 36/72]
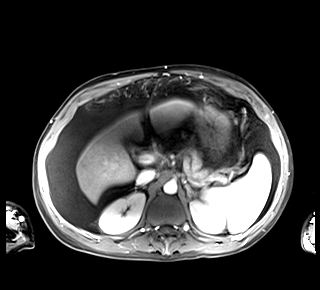
[im 72/72]
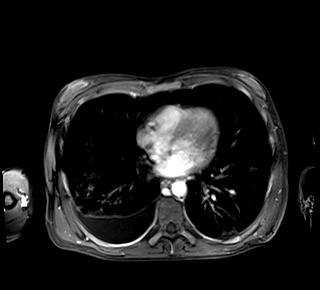

[Series 16: T1 dynamic post-contrast · axial · 3.0mm · 1.25mm/px · z∈[-121,+92]mm · 3 of 72 slices shown (6 of 9)]
[im 1/72]
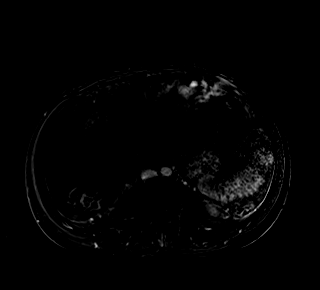
[im 36/72]
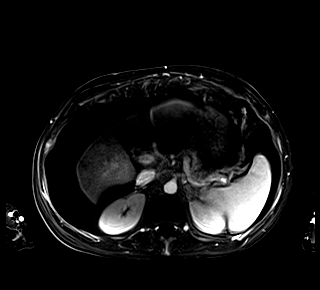
[im 72/72]
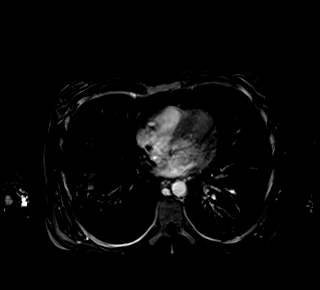

[Series 17: T1 dynamic post-contrast · coronal · 3.0mm · 1.25mm/px · 3 of 80 slices shown (7 of 9)]
[im 1/80]
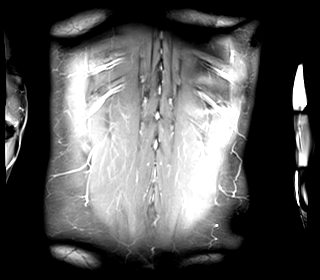
[im 40/80]
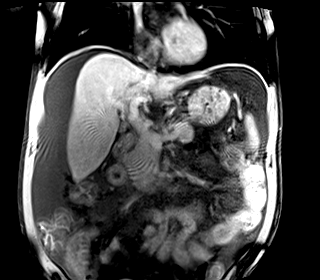
[im 80/80]
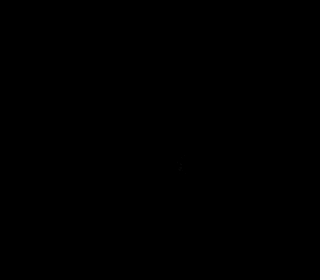

[Series 18: T1 dynamic post-contrast · axial · 3.0mm · 1.25mm/px · z∈[-121,+92]mm · 3 of 72 slices shown (8 of 9)]
[im 1/72]
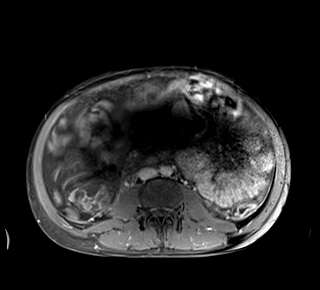
[im 36/72]
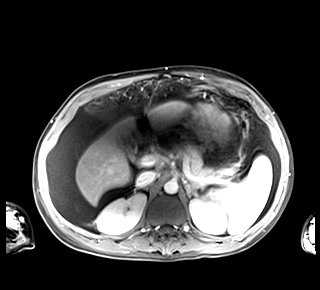
[im 72/72]
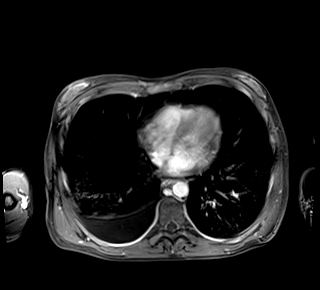

[Series 19: T1 dynamic post-contrast · axial · 3.0mm · 1.25mm/px · z∈[-121,+92]mm · 3 of 72 slices shown (9 of 9)]
[im 1/72]
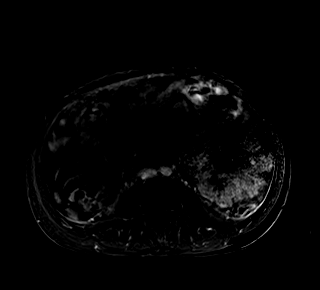
[im 36/72]
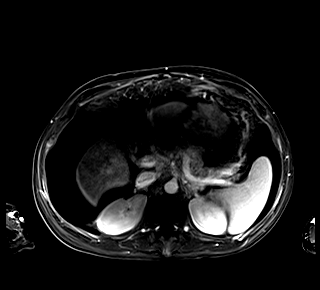
[im 72/72]
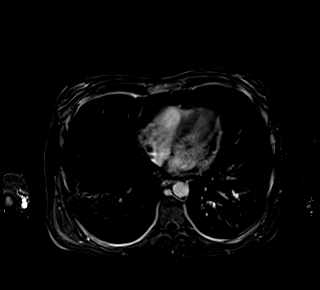

[48 of 48 positions shown; findings below may reference images not displayed]

FINDINGS: Lower chest: Small right pleural effusion with adjacent atelectasis.

Hepatobiliary: Cirrhotic hepatic morphology. No suspicious hepatic
lesion. Diffuse wall thickening of a predominantly decompressed
gallbladder with some hyperenhancement of the wall, which is favored
reflect sequela of chronic hepatocellular disease. No biliary ductal
dilation.

Pancreas: No pancreatic ductal dilation or evidence of acute
inflammation.

Spleen: The spleen measures 13.7 x 10.1 x 7.2 cm (volume = 520
cm^3) compatible with splenomegaly.

Adrenals/Urinary Tract: Bilateral adrenal glands are unremarkable.
No hydronephrosis. No solid enhancing renal mass.

Stomach/Bowel: Stomach is unremarkable for degree of distension. No
pathologic dilation of bowel. Wall thickening of the colon favored
to reflect portal colopathy.

Vascular/Lymphatic: The portal, splenic and superior mesenteric
veins are patent. Abdominal collateral vessels.

Other:  Large volume abdominal ascites.

Musculoskeletal: No suspicious bone lesions identified.
IMPRESSION: 1. Cirrhotic hepatic morphology with no suspicious hepatic lesion.
2. Sequela of portal hypertension including large volume abdominal
ascites, splenomegaly and portal colopathy.
3. Similar wall thickening of the gallbladder with some
hyperenhancement of the wall, which is favored to reflect sequela of
chronic hepatocellular disease.

## 2021-01-19 MED ORDER — GADOBENATE DIMEGLUMINE 529 MG/ML IV SOLN
14.0000 mL | Freq: Once | INTRAVENOUS | Status: AC | PRN
  Administered 2021-01-19: 14 mL via INTRAVENOUS

## 2021-01-23 ENCOUNTER — Other Ambulatory Visit: Payer: Self-pay

## 2021-01-23 ENCOUNTER — Ambulatory Visit (HOSPITAL_COMMUNITY)
Admission: RE | Admit: 2021-01-23 | Discharge: 2021-01-23 | Disposition: A | Discharge status: Home / Self Care | Payer: Commercial Managed Care - PPO | Source: Ambulatory Visit | Attending: Family | Admitting: Family

## 2021-01-23 DIAGNOSIS — K7030 Alcoholic cirrhosis of liver without ascites: Secondary | ICD-10-CM | POA: Diagnosis present

## 2021-01-23 DIAGNOSIS — R791 Abnormal coagulation profile: Secondary | ICD-10-CM | POA: Insufficient documentation

## 2021-01-23 DIAGNOSIS — D500 Iron deficiency anemia secondary to blood loss (chronic): Secondary | ICD-10-CM | POA: Insufficient documentation

## 2021-01-23 DIAGNOSIS — R1084 Generalized abdominal pain: Secondary | ICD-10-CM | POA: Insufficient documentation

## 2021-01-23 IMAGING — US US PARACENTESIS
1 series · 5 of 5 positions shown · non-contrast
Comparison: none

INDICATION: Patient with history of alcoholic cirrhosis with recurrent ascites.
Request is for therapeutic paracentesis.

[Series 1: us paracentesis mc & wl · 5 of 5 slices shown]
[im 1/5]
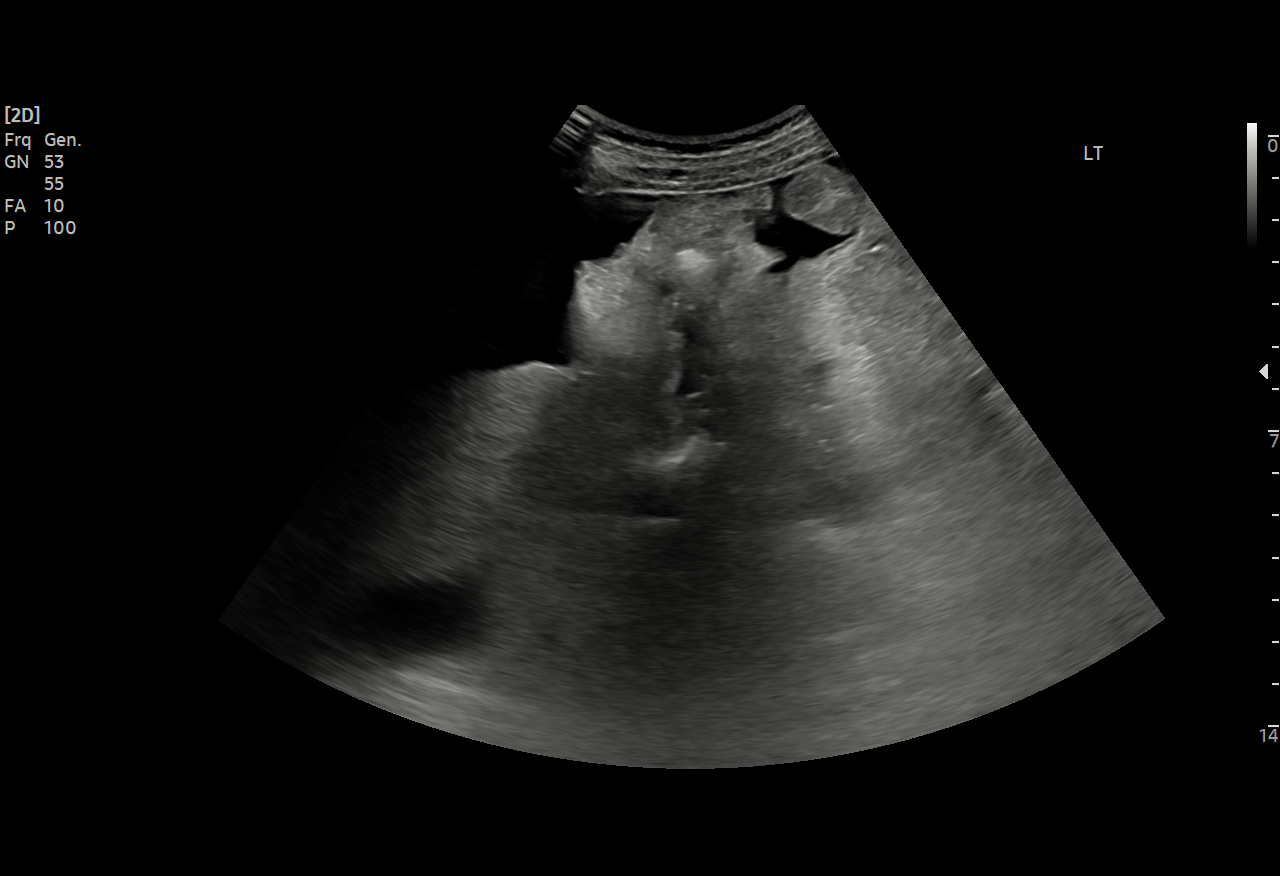
[im 2/5]
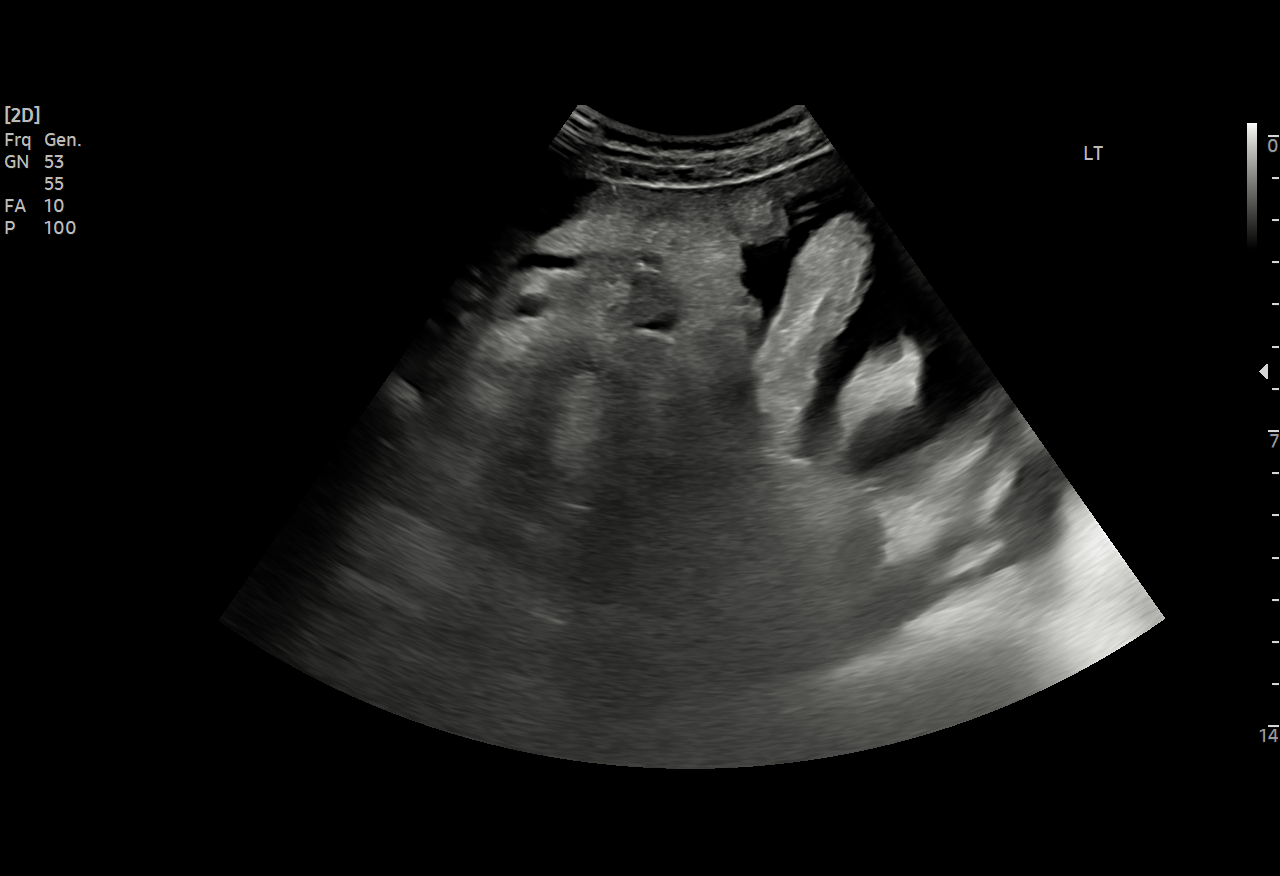
[im 3/5]
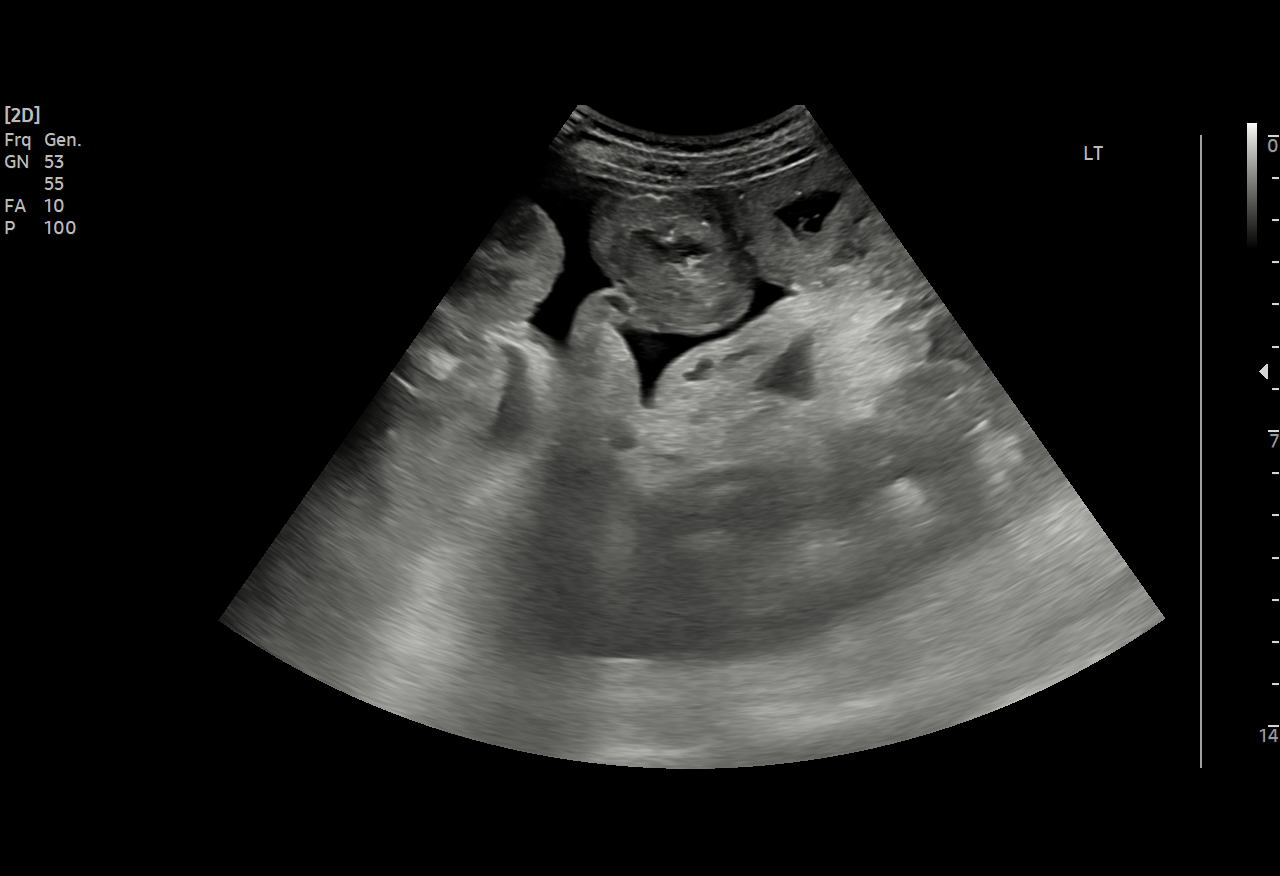
[im 4/5]
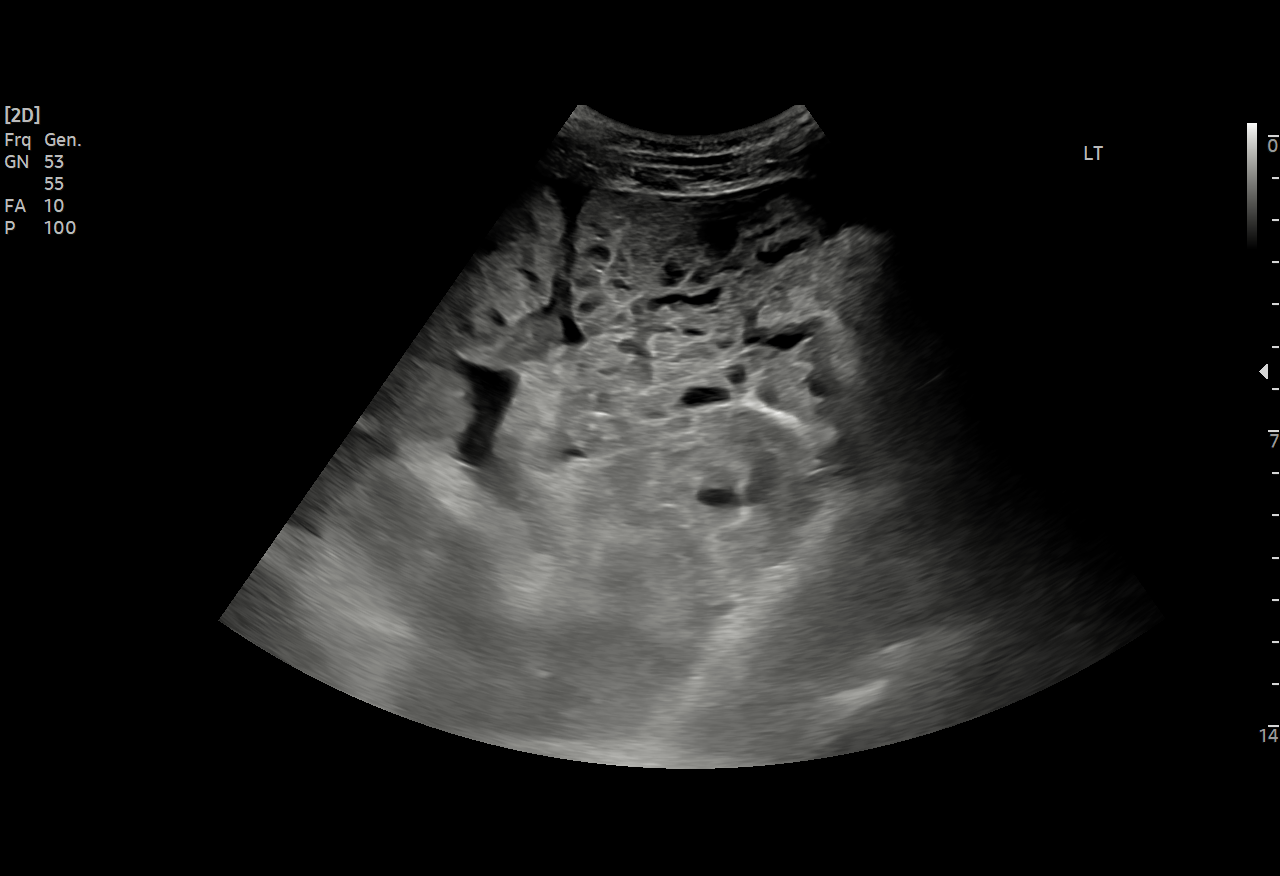
[im 5/5]
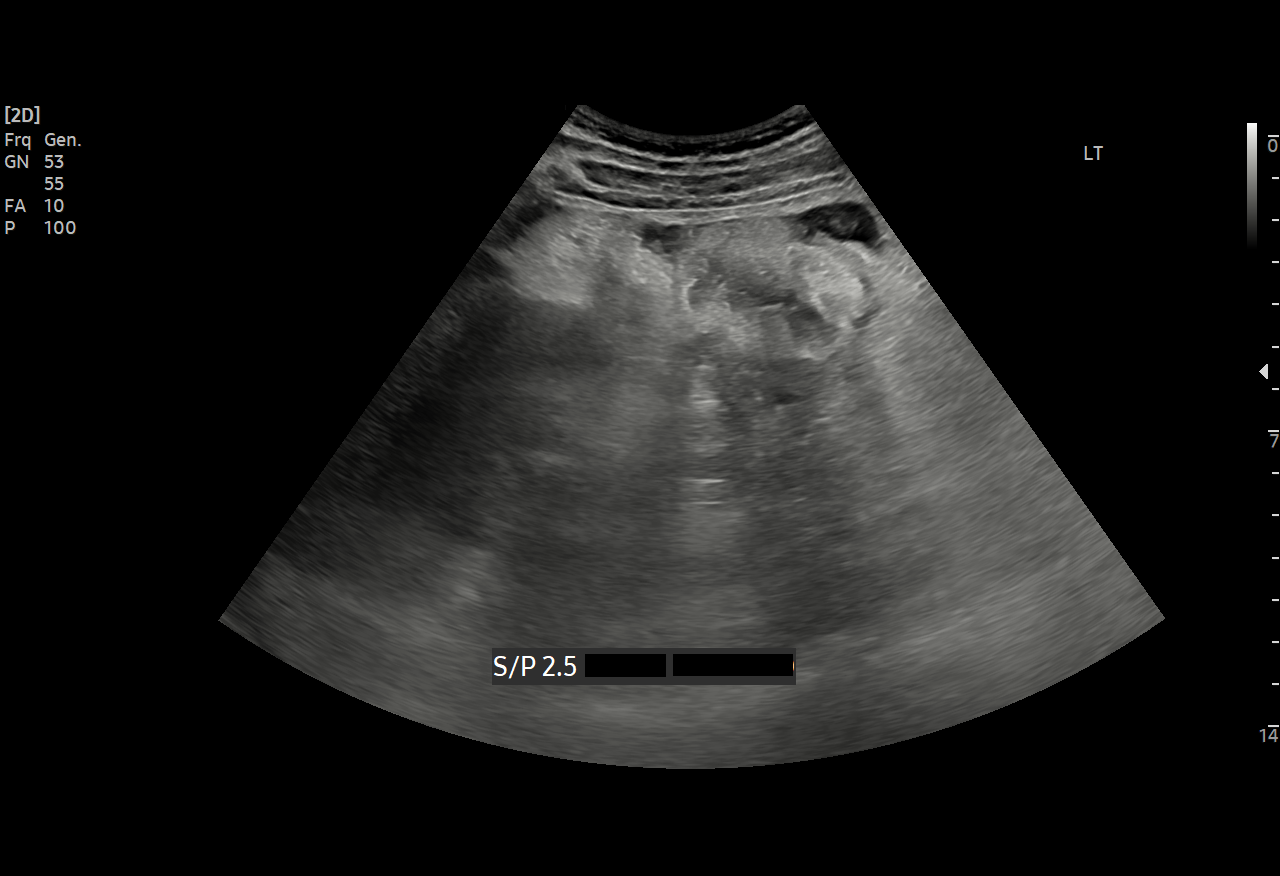

[5 of 5 positions shown; findings below may reference images not displayed]

EXAM:
ULTRASOUND GUIDED THERAPEUTIC PARACENTESIS

MEDICATIONS:
Lidocaine 1% 10 mL

COMPLICATIONS:
None immediate.

PROCEDURE:
Informed written consent was obtained from the patient after a
discussion of the risks, benefits and alternatives to treatment. A
timeout was performed prior to the initiation of the procedure.

Initial ultrasound scanning demonstrates a small amount of ascites
within the right lower abdominal quadrant. The right lower abdomen
was prepped and draped in the usual sterile fashion. 1% lidocaine
was used for local anesthesia.

Following this, a 19 gauge, 7-cm, Yueh catheter was introduced. An
ultrasound image was saved for documentation purposes. The
paracentesis was performed. The catheter was removed and a dressing
was applied. The patient tolerated the procedure well without
immediate post procedural complication.
FINDINGS: A total of approximately 2.5 L of straw-colored fluid was removed.
IMPRESSION: Successful ultrasound-guided therapeutic paracentesis yielding
liters of peritoneal fluid.

Read by: GOGO, NP

## 2021-01-23 MED ORDER — LIDOCAINE HCL 1 % IJ SOLN
INTRAMUSCULAR | Status: AC
  Administered 2021-01-23: 15:00:00 12 mL
  Filled 2021-01-23: qty 20

## 2021-01-23 NOTE — Procedures (Signed)
,  Ultrasound-guided therapeutic paracentesis performed yielding 2.5 liters of straw colored fluid.  No immediate complications. EBL is none.

## 2021-02-06 ENCOUNTER — Other Ambulatory Visit: Payer: Self-pay | Admitting: *Deleted

## 2021-02-06 ENCOUNTER — Other Ambulatory Visit: Payer: Self-pay

## 2021-02-06 ENCOUNTER — Other Ambulatory Visit: Payer: Commercial Managed Care - PPO

## 2021-02-06 ENCOUNTER — Inpatient Hospital Stay (HOSPITAL_BASED_OUTPATIENT_CLINIC_OR_DEPARTMENT_OTHER): Payer: Commercial Managed Care - PPO | Admitting: Family

## 2021-02-06 ENCOUNTER — Encounter: Payer: Self-pay | Admitting: *Deleted

## 2021-02-06 ENCOUNTER — Ambulatory Visit: Payer: Commercial Managed Care - PPO | Admitting: Family

## 2021-02-06 ENCOUNTER — Inpatient Hospital Stay: Payer: Commercial Managed Care - PPO | Attending: Hematology & Oncology

## 2021-02-06 ENCOUNTER — Encounter: Payer: Self-pay | Admitting: Family

## 2021-02-06 VITALS — BP 75/50 | HR 73 | Temp 98.6°F | Resp 18 | Ht 66.0 in | Wt 150.4 lb

## 2021-02-06 DIAGNOSIS — D500 Iron deficiency anemia secondary to blood loss (chronic): Secondary | ICD-10-CM

## 2021-02-06 DIAGNOSIS — M25472 Effusion, left ankle: Secondary | ICD-10-CM | POA: Insufficient documentation

## 2021-02-06 DIAGNOSIS — R791 Abnormal coagulation profile: Secondary | ICD-10-CM

## 2021-02-06 DIAGNOSIS — R531 Weakness: Secondary | ICD-10-CM | POA: Diagnosis not present

## 2021-02-06 DIAGNOSIS — R1084 Generalized abdominal pain: Secondary | ICD-10-CM

## 2021-02-06 DIAGNOSIS — K7030 Alcoholic cirrhosis of liver without ascites: Secondary | ICD-10-CM

## 2021-02-06 DIAGNOSIS — R42 Dizziness and giddiness: Secondary | ICD-10-CM | POA: Diagnosis not present

## 2021-02-06 DIAGNOSIS — R2689 Other abnormalities of gait and mobility: Secondary | ICD-10-CM | POA: Insufficient documentation

## 2021-02-06 DIAGNOSIS — G629 Polyneuropathy, unspecified: Secondary | ICD-10-CM | POA: Insufficient documentation

## 2021-02-06 DIAGNOSIS — Z79899 Other long term (current) drug therapy: Secondary | ICD-10-CM | POA: Insufficient documentation

## 2021-02-06 DIAGNOSIS — M25471 Effusion, right ankle: Secondary | ICD-10-CM | POA: Insufficient documentation

## 2021-02-06 DIAGNOSIS — R5383 Other fatigue: Secondary | ICD-10-CM | POA: Insufficient documentation

## 2021-02-06 LAB — CMP (CANCER CENTER ONLY)
ALT: 32 U/L (ref 0–44)
AST: 36 U/L (ref 15–41)
Albumin: 1.8 g/dL — ABNORMAL LOW (ref 3.5–5.0)
Alkaline Phosphatase: 152 U/L — ABNORMAL HIGH (ref 38–126)
Anion gap: 10 (ref 5–15)
BUN: 25 mg/dL — ABNORMAL HIGH (ref 6–20)
CO2: 22 mmol/L (ref 22–32)
Calcium: 7.7 mg/dL — ABNORMAL LOW (ref 8.9–10.3)
Chloride: 99 mmol/L (ref 98–111)
Creatinine: 3.42 mg/dL (ref 0.61–1.24)
GFR, Estimated: 22 mL/min — ABNORMAL LOW (ref >60)
Glucose, Bld: 149 mg/dL — ABNORMAL HIGH (ref 70–99)
Potassium: 4.1 mmol/L (ref 3.5–5.1)
Sodium: 131 mmol/L — ABNORMAL LOW (ref 135–145)
Total Bilirubin: 5.2 mg/dL (ref 0.3–1.2)
Total Protein: 5.6 g/dL — ABNORMAL LOW (ref 6.5–8.1)

## 2021-02-06 LAB — CBC WITH DIFFERENTIAL (CANCER CENTER ONLY)
Abs Immature Granulocytes: 0.18 10*3/uL — ABNORMAL HIGH (ref 0.00–0.07)
Basophils Absolute: 0.1 10*3/uL (ref 0.0–0.1)
Basophils Relative: 0 %
Eosinophils Absolute: 0.3 10*3/uL (ref 0.0–0.5)
Eosinophils Relative: 2 %
HCT: 27.9 % — ABNORMAL LOW (ref 39.0–52.0)
Hemoglobin: 9.2 g/dL — ABNORMAL LOW (ref 13.0–17.0)
Immature Granulocytes: 1 %
Lymphocytes Relative: 20 %
Lymphs Abs: 2.6 10*3/uL (ref 0.7–4.0)
MCH: 32.4 pg (ref 26.0–34.0)
MCHC: 33 g/dL (ref 30.0–36.0)
MCV: 98.2 fL (ref 80.0–100.0)
Monocytes Absolute: 1.3 10*3/uL — ABNORMAL HIGH (ref 0.1–1.0)
Monocytes Relative: 10 %
Neutro Abs: 8.4 10*3/uL — ABNORMAL HIGH (ref 1.7–7.7)
Neutrophils Relative %: 67 %
Platelet Count: 73 10*3/uL — ABNORMAL LOW (ref 150–400)
RBC: 2.84 MIL/uL — ABNORMAL LOW (ref 4.22–5.81)
RDW: 20.3 % — ABNORMAL HIGH (ref 11.5–15.5)
WBC Count: 12.9 10*3/uL — ABNORMAL HIGH (ref 4.0–10.5)
nRBC: 0 % (ref 0.0–0.2)

## 2021-02-06 LAB — RETICULOCYTES
Immature Retic Fract: 22 % — ABNORMAL HIGH (ref 2.3–15.9)
RBC.: 2.82 MIL/uL — ABNORMAL LOW (ref 4.22–5.81)
Retic Count, Absolute: 84 10*3/uL (ref 19.0–186.0)
Retic Ct Pct: 3 % (ref 0.4–3.1)

## 2021-02-06 LAB — PROTIME-INR
INR: 2.9 — ABNORMAL HIGH (ref 0.8–1.2)
Prothrombin Time: 30.6 seconds — ABNORMAL HIGH (ref 11.4–15.2)

## 2021-02-06 LAB — SAMPLE TO BLOOD BANK

## 2021-02-06 MED ORDER — PHYTONADIONE 5 MG PO TABS: 10.0000 mg | ORAL_TABLET | Freq: Every day | ORAL | 2 refills | Status: AC

## 2021-02-06 NOTE — Progress Notes (Unsigned)
Critical Creatinine 3.42 and Total Bilirubin 5.2 given to Dorothy Spark, RN at 1556 pm by Para Skeans (MT) 02/06/2021

## 2021-02-06 NOTE — Progress Notes (Signed)
Hematology and Oncology Follow Up Visit  Zachary Mata [id number] Apr 24, 1980 41 y.o. 02/06/2021   Principle Diagnosis:  ETOH cirrhosis  Iron deficiency anemia secondary to GI blood loss    Current Therapy: Transfusional support as needed IV iron as indicated    Interim History:  Zachary Mata is here today for follow-up with his wife. He is symptomatic with fatigue, weakness, poor balance with a fall, dizziness and pain across the top of his abdomen. He had 2.5 liters removed with paracentesis on 01/23/2021. He feels that his ascites has returned and that he needs another paracentesis. Abdomen is firm on palpation.  He has not noted any blood loss. No abnormal bruising or petechiae.  His pharmacy did not get the electronic order for his vitamin K. We will resend today.  He has minimal appetite and states that the thought of trying to swallow food makes him sick. He is doing his best to stay well hydrated. His weight is 150 lbs.  He is tearful during our visit today and is interested in palliative care. We discussed this at length and he is ready to with move forward with the referral.  No fever, chills, vomiting, cough, rash, chest pain, palpitations or changes in bowel habits.  He has minimal swelling in both ankles.  Peripheral neuropathy in his upper and lower extremities is unchanged from baseline.   ECOG Performance Status: 1 - Symptomatic but completely ambulatory  Medications:  Allergies as of 02/06/2021       Reactions   Sulfa Antibiotics Swelling, Other (See Comments)        Medication List        Accurate as of February 06, 2021  3:10 PM. If you have any questions, ask your nurse or doctor.          ARIPiprazole 10 MG tablet Commonly known as: ABILIFY Take 1 tablet (10 mg total) by mouth daily.   calcium carbonate 1250 (500 Ca) MG chewable tablet Commonly known as: OS-CAL Chew 1 tablet (1,250 mg total) by mouth 2 (two) times daily.   cetirizine 10 MG  tablet Commonly known as: ZYRTEC Take 10 mg by mouth daily.   escitalopram 20 MG tablet Commonly known as: LEXAPRO TAKE 1 TABLET BY MOUTH EVERY DAY   furosemide 40 MG tablet Commonly known as: LASIX Take 1 tablet (40 mg total) by mouth daily.   gabapentin 300 MG capsule Commonly known as: NEURONTIN TAKE 1 CAPSULE BY MOUTH TWICE A DAY   hydrOXYzine 25 MG tablet Commonly known as: ATARAX TAKE 1 TABLET BY MOUTH TWICE A DAY AS NEEDED   ipratropium 0.06 % nasal spray Commonly known as: ATROVENT Place 2 sprays into both nostrils 4 (four) times daily.   lactulose 10 GM/15ML solution Commonly known as: CHRONULAC Take 45 mLs (30 g total) by mouth daily.   magnesium oxide 400 MG tablet Commonly known as: MAG-OX Take 2 tablets (800 mg total) by mouth at bedtime.   mirtazapine 15 MG tablet Commonly known as: REMERON TAKE 1 TABLET BY MOUTH EVERYDAY AT BEDTIME What changed: See the new instructions.   nadolol 20 MG tablet Commonly known as: CORGARD Take 0.5 tablets (10 mg total) by mouth daily.   ondansetron 8 MG disintegrating tablet Commonly known as: ZOFRAN-ODT Take 1 tablet (8 mg total) by mouth every 8 (eight) hours as needed for nausea.   pantoprazole 40 MG tablet Commonly known as: PROTONIX TAKE 1 TABLET BY MOUTH EVERY DAY   phytonadione 5 MG tablet  Commonly known as: VITAMIN K Take 2 tablets (10 mg total) by mouth daily.   spironolactone 100 MG tablet Commonly known as: ALDACTONE Take 1 tablet (100 mg total) by mouth daily.   triamcinolone 0.1 % paste Commonly known as: KENALOG Use as directed 1 application in the mouth or throat 2 (two) times daily.   triamcinolone 55 MCG/ACT Aero nasal inhaler Commonly known as: NASACORT Place 2 sprays into the nose daily.   Xifaxan 550 MG Tabs tablet Generic drug: rifaximin Take 550 mg by mouth 2 (two) times daily.   Zinc 50 MG Tabs Take 50 mg by mouth daily.   zolpidem 5 MG tablet Commonly known as: AMBIEN TAKE  1 TABLET BY MOUTH EVERY DAY AT BEDTIME AS NEEDED FOR SLEEP        Allergies:  Allergies  Allergen Reactions   Sulfa Antibiotics Swelling and Other (See Comments)    Past Medical History, Surgical history, Social history, and Family History were reviewed and updated.  Review of Systems: All other 10 point review of systems is negative.   Physical Exam:  vitals were not taken for this visit.   Wt Readings from Last 3 Encounters:  01/16/21 147 lb 12.8 oz (67 kg)  01/08/21 149 lb (67.6 kg)  01/05/21 150 lb (68 kg)    Ocular: Sclerae unicteric, pupils equal, round and reactive to light Ear-nose-throat: Oropharynx clear, dentition fair Lymphatic: No cervical or supraclavicular adenopathy Lungs no rales or rhonchi, good excursion bilaterally Heart regular rate and rhythm, no murmur appreciated Abd firm and tender on exam, positive bowel sounds MSK no focal spinal tenderness, no joint edema Neuro: non-focal, well-oriented, appropriate affect Breasts: Deferred   Lab Results  Component Value Date   WBC 12.9 (H) 02/06/2021   HGB 9.2 (L) 02/06/2021   HCT 27.9 (L) 02/06/2021   MCV 98.2 02/06/2021   PLT 73 (L) 02/06/2021   Lab Results  Component Value Date   FERRITIN 62 01/16/2021   IRON 22 (L) 01/16/2021   TIBC 261 01/16/2021   UIBC 239 01/16/2021   IRONPCTSAT 8 (L) 01/16/2021   Lab Results  Component Value Date   RETICCTPCT 3.0 02/06/2021   RBC 2.82 (L) 02/06/2021   No results found for: KPAFRELGTCHN, LAMBDASER, KAPLAMBRATIO Lab Results  Component Value Date   IGGSERUM 2,221 (H) 11/28/2019   No results found for: TOTALPROTELP, ALBUMINELP, A1GS, A2GS, BETS, BETA2SER, GAMS, MSPIKE, SPEI   Chemistry      Component Value Date/Time   NA 127 (L) 01/16/2021 0745   NA 144 11/06/2017 0927   K 4.1 01/16/2021 0745   CL 96 (L) 01/16/2021 0745   CO2 22 01/16/2021 0745   BUN 21 (H) 01/16/2021 0745   BUN 7 11/06/2017 0927   CREATININE 1.56 (H) 01/16/2021 0745    CREATININE 1.20 07/17/2020 1007      Component Value Date/Time   CALCIUM 7.9 (L) 01/16/2021 0745   ALKPHOS 151 (H) 01/16/2021 0745   AST 41 01/16/2021 0745   ALT 35 01/16/2021 0745   BILITOT 5.7 ([place]) 01/16/2021 0745       Impression and Plan: Zachary Mata is a pleasant 41 yo caucasian gentleman with ETOH cirrhosis end stage and anemia secondary to GI blood loss.  No transfusion needed at this time, Hgb is 9.2.  Iron studies are pending.  His kidney function tests are showing signs of kidney failure. Patient has noted he is urinating less often but also has not been hydrating well.  Palliative care referral  placed. We will call and let them know that patient has a guard dog that will be removed from the home prior to any visits. Wife asks for call before they come to assess.  We will continue to have his labs checked with his PCP office at [facility] every 3 weeks as this is closer to home for him. It is hard for him to come this far from home unless he is getting blood.  We will do telephone or in person visits as needed.  They were encouraged to contact our office with any questions or concerns.  Dr. Marin Olp aware of the aforementioned and is in agreement with the plan Greater than 50% of today's visit was spent counseling and coordinating care.   Lottie Dawson, NP 1/3/20233:10 PM  Addendum: Damaris Schooner with wife this morning and the patient has decided on Hospice referral at this time as he continues to decline. We have contacted hospice with [facility] and they will see him today.   02/07/2021 10:35 AM

## 2021-02-07 ENCOUNTER — Other Ambulatory Visit: Payer: Self-pay | Admitting: *Deleted

## 2021-02-07 ENCOUNTER — Encounter: Payer: Self-pay | Admitting: Family

## 2021-02-07 ENCOUNTER — Other Ambulatory Visit: Payer: Self-pay | Admitting: Family

## 2021-02-07 DIAGNOSIS — K7030 Alcoholic cirrhosis of liver without ascites: Secondary | ICD-10-CM

## 2021-02-07 DIAGNOSIS — D500 Iron deficiency anemia secondary to blood loss (chronic): Secondary | ICD-10-CM

## 2021-02-07 DIAGNOSIS — R791 Abnormal coagulation profile: Secondary | ICD-10-CM

## 2021-02-07 DIAGNOSIS — D649 Anemia, unspecified: Secondary | ICD-10-CM

## 2021-02-07 LAB — FERRITIN: Ferritin: 213 ng/mL (ref 24–336)

## 2021-02-07 LAB — IRON AND IRON BINDING CAPACITY (CC-WL,HP ONLY)
Iron: 77 ug/dL (ref 45–182)
Saturation Ratios: 50 % — ABNORMAL HIGH (ref 17.9–39.5)
TIBC: 155 ug/dL — ABNORMAL LOW (ref 250–450)
UIBC: 78 ug/dL — ABNORMAL LOW (ref 117–376)

## 2021-02-07 NOTE — Progress Notes (Signed)
Hospice referral placed to [facility] rather than Palliative Care after talking with Katrina, patients wife.  She feels that maybe Hospice may be more appropriate at this time for his needs and feels he is declining faster than originally thought.  Called [facility] and changed palliative to hospice services

## 2021-02-08 ENCOUNTER — Telehealth: Payer: Commercial Managed Care - PPO

## 2021-02-09 ENCOUNTER — Telehealth: Payer: Self-pay | Admitting: *Deleted

## 2021-02-09 ENCOUNTER — Telehealth: Payer: Self-pay

## 2021-02-09 NOTE — Telephone Encounter (Signed)
Sounds like we need to control his pain, we can do some oxycodone with Colace until the hospice provider gives orders for morphine.  Let me know if this is okay.

## 2021-02-09 NOTE — Telephone Encounter (Signed)
Per 02/06/21 los - Palliative care referral placed today

## 2021-02-09 NOTE — Telephone Encounter (Signed)
Baxter Hire called to report that patient is having abdomen/back pain and not on any pain meds. She would like your opinion.

## 2021-02-09 NOTE — Telephone Encounter (Signed)
Spoke with Baxter Hire who stated that she contacted the Hospice provider who ordered the oxycodone for patient.

## 2021-02-25 ENCOUNTER — Other Ambulatory Visit: Payer: Self-pay | Admitting: Gastroenterology

## 2021-02-25 ENCOUNTER — Other Ambulatory Visit: Payer: Self-pay | Admitting: Sports Medicine

## 2021-02-25 DIAGNOSIS — F32A Depression, unspecified: Secondary | ICD-10-CM

## 2021-02-25 DIAGNOSIS — R42 Dizziness and giddiness: Secondary | ICD-10-CM

## 2021-02-25 DIAGNOSIS — F419 Anxiety disorder, unspecified: Secondary | ICD-10-CM

## 2021-03-07 ENCOUNTER — Other Ambulatory Visit: Payer: Self-pay | Admitting: Sports Medicine

## 2021-03-12 ENCOUNTER — Other Ambulatory Visit: Payer: Self-pay | Admitting: Sports Medicine

## 2021-03-12 DIAGNOSIS — G8929 Other chronic pain: Secondary | ICD-10-CM

## 2021-03-12 DIAGNOSIS — M5442 Lumbago with sciatica, left side: Secondary | ICD-10-CM

## 2021-03-26 ENCOUNTER — Other Ambulatory Visit: Payer: Self-pay | Admitting: Sports Medicine

## 2021-03-26 ENCOUNTER — Telehealth: Payer: Self-pay | Admitting: Gastroenterology

## 2021-03-26 DIAGNOSIS — F419 Anxiety disorder, unspecified: Secondary | ICD-10-CM

## 2021-03-26 MED ORDER — ZOLPIDEM TARTRATE 5 MG PO TABS: ORAL_TABLET | ORAL | 5 refills | Status: AC

## 2021-03-26 MED ORDER — ESCITALOPRAM OXALATE 20 MG PO TABS: 20.0000 mg | ORAL_TABLET | Freq: Every day | ORAL | 2 refills | Status: AC

## 2021-03-26 MED ORDER — ONDANSETRON 8 MG PO TBDP: 8.0000 mg | ORAL_TABLET | Freq: Three times a day (TID) | ORAL | 3 refills | Status: AC | PRN

## 2021-03-26 NOTE — Addendum Note (Signed)
Addended by: Carolin Coy on: 03/26/2021 03:18 PM   Modules accepted: Orders

## 2021-03-26 NOTE — Telephone Encounter (Signed)
Pharmacy is already updated.

## 2021-03-26 NOTE — Telephone Encounter (Signed)
Inbound call from patient's wife stating all prescriptions should go to [id number] - [place], [place] - [address].

## 2021-03-26 NOTE — Telephone Encounter (Signed)
Pt's wife called and stated that some of her husbands RX's are still goint to the wrong pharmacy like Lexapro, Ambien and Zofran. They [facility].

## 2021-05-13 ENCOUNTER — Other Ambulatory Visit: Payer: Self-pay | Admitting: Sports Medicine

## 2023-10-07 ENCOUNTER — Encounter: Payer: Self-pay | Admitting: Sports Medicine
